# Patient Record
Sex: Male | Born: 1955 | Race: White | Hispanic: No | Smoking: Former smoker
Health system: Southern US, Community
[De-identification: ages and names within clinical notes are randomized; demographics above are authoritative.]

## PROBLEM LIST (undated history)

## (undated) DIAGNOSIS — N189 Chronic kidney disease, unspecified: Secondary | ICD-10-CM

## (undated) DIAGNOSIS — I10 Essential (primary) hypertension: Secondary | ICD-10-CM

## (undated) DIAGNOSIS — K219 Gastro-esophageal reflux disease without esophagitis: Secondary | ICD-10-CM

## (undated) DIAGNOSIS — R972 Elevated prostate specific antigen [PSA]: Secondary | ICD-10-CM

## (undated) DIAGNOSIS — E785 Hyperlipidemia, unspecified: Secondary | ICD-10-CM

## (undated) DIAGNOSIS — K648 Other hemorrhoids: Secondary | ICD-10-CM

## (undated) DIAGNOSIS — M1990 Unspecified osteoarthritis, unspecified site: Secondary | ICD-10-CM

## (undated) DIAGNOSIS — E079 Disorder of thyroid, unspecified: Secondary | ICD-10-CM

## (undated) DIAGNOSIS — F419 Anxiety disorder, unspecified: Secondary | ICD-10-CM

## (undated) DIAGNOSIS — Z87442 Personal history of urinary calculi: Secondary | ICD-10-CM

## (undated) DIAGNOSIS — J45909 Unspecified asthma, uncomplicated: Secondary | ICD-10-CM

## (undated) DIAGNOSIS — I452 Bifascicular block: Secondary | ICD-10-CM

## (undated) HISTORY — PX: CHOLECYSTECTOMY: SHX55

## (undated) HISTORY — DX: Hyperlipidemia, unspecified: E78.5

## (undated) HISTORY — PX: PROSTATE BIOPSY: SHX241

## (undated) HISTORY — PX: NO PAST SURGERIES: SHX2092

## (undated) HISTORY — DX: Disorder of thyroid, unspecified: E07.9

## (undated) HISTORY — DX: Essential (primary) hypertension: I10

## (undated) HISTORY — DX: Chronic kidney disease, unspecified: N18.9

---

## 2018-12-21 DIAGNOSIS — Z87442 Personal history of urinary calculi: Secondary | ICD-10-CM

## 2018-12-21 HISTORY — DX: Personal history of urinary calculi: Z87.442

## 2019-02-19 ENCOUNTER — Emergency Department (HOSPITAL_COMMUNITY): Payer: BLUE CROSS/BLUE SHIELD

## 2019-02-19 ENCOUNTER — Emergency Department (HOSPITAL_COMMUNITY)
Admission: EM | Admit: 2019-02-19 | Discharge: 2019-02-19 | Disposition: A | Discharge status: Home / Self Care | Payer: BLUE CROSS/BLUE SHIELD | Attending: Emergency Medicine | Admitting: Emergency Medicine

## 2019-02-19 ENCOUNTER — Encounter (HOSPITAL_COMMUNITY): Payer: Self-pay | Admitting: Emergency Medicine

## 2019-02-19 ENCOUNTER — Other Ambulatory Visit: Payer: Self-pay

## 2019-02-19 DIAGNOSIS — R1032 Left lower quadrant pain: Secondary | ICD-10-CM | POA: Diagnosis present

## 2019-02-19 DIAGNOSIS — N200 Calculus of kidney: Secondary | ICD-10-CM

## 2019-02-19 DIAGNOSIS — Z87891 Personal history of nicotine dependence: Secondary | ICD-10-CM | POA: Insufficient documentation

## 2019-02-19 DIAGNOSIS — Z79899 Other long term (current) drug therapy: Secondary | ICD-10-CM | POA: Insufficient documentation

## 2019-02-19 DIAGNOSIS — N3001 Acute cystitis with hematuria: Secondary | ICD-10-CM | POA: Insufficient documentation

## 2019-02-19 LAB — URINALYSIS, ROUTINE W REFLEX MICROSCOPIC
Bacteria, UA: NONE SEEN
Bilirubin Urine: NEGATIVE
Glucose, UA: NEGATIVE mg/dL
Ketones, ur: 20 mg/dL — AB
Nitrite: NEGATIVE
Protein, ur: 100 mg/dL — AB
RBC / HPF: >50 RBC/hpf — ABNORMAL HIGH (ref 0–5)
Specific Gravity, Urine: 1.025 (ref 1.005–1.030)
WBC, UA: >50 WBC/hpf — ABNORMAL HIGH (ref 0–5)
pH: 5 (ref 5.0–8.0)

## 2019-02-19 LAB — COMPREHENSIVE METABOLIC PANEL
ALT: 98 U/L — ABNORMAL HIGH (ref 0–44)
AST: 35 U/L (ref 15–41)
Albumin: 4.5 g/dL (ref 3.5–5.0)
Alkaline Phosphatase: 94 U/L (ref 38–126)
Anion gap: 13 (ref 5–15)
BUN: 23 mg/dL — ABNORMAL HIGH (ref 6–20)
CO2: 25 mmol/L (ref 22–32)
Calcium: 10 mg/dL (ref 8.9–10.3)
Chloride: 98 mmol/L (ref 98–111)
Creatinine, Ser: 1.16 mg/dL (ref 0.61–1.24)
GFR calc Af Amer: >60 mL/min (ref >60)
GFR calc non Af Amer: >60 mL/min (ref >60)
Glucose, Bld: 144 mg/dL — ABNORMAL HIGH (ref 70–99)
Potassium: 3.8 mmol/L (ref 3.5–5.1)
Sodium: 136 mmol/L (ref 135–145)
Total Bilirubin: 0.6 mg/dL (ref 0.3–1.2)
Total Protein: 9.2 g/dL — ABNORMAL HIGH (ref 6.5–8.1)

## 2019-02-19 LAB — CBC
HCT: 52.7 % — ABNORMAL HIGH (ref 39.0–52.0)
Hemoglobin: 17.4 g/dL — ABNORMAL HIGH (ref 13.0–17.0)
MCH: 31.2 pg (ref 26.0–34.0)
MCHC: 33 g/dL (ref 30.0–36.0)
MCV: 94.6 fL (ref 80.0–100.0)
Platelets: 782 10*3/uL — ABNORMAL HIGH (ref 150–400)
RBC: 5.57 MIL/uL (ref 4.22–5.81)
RDW: 13.1 % (ref 11.5–15.5)
WBC: 18.6 10*3/uL — ABNORMAL HIGH (ref 4.0–10.5)
nRBC: 0 % (ref 0.0–0.2)

## 2019-02-19 MED ORDER — SODIUM CHLORIDE 0.9 % IV BOLUS
1000.0000 mL | Freq: Once | INTRAVENOUS | Status: AC
  Administered 2019-02-19: 1000 mL via INTRAVENOUS

## 2019-02-19 MED ORDER — ONDANSETRON HCL 4 MG/2ML IJ SOLN
4.0000 mg | Freq: Once | INTRAMUSCULAR | Status: AC
  Administered 2019-02-19: 4 mg via INTRAVENOUS
  Filled 2019-02-19: qty 2

## 2019-02-19 MED ORDER — CEPHALEXIN 500 MG PO CAPS: 500.0000 mg | ORAL_CAPSULE | Freq: Four times a day (QID) | ORAL | 0 refills | Status: DC

## 2019-02-19 MED ORDER — MORPHINE SULFATE (PF) 4 MG/ML IV SOLN
4.0000 mg | Freq: Once | INTRAVENOUS | Status: AC
  Administered 2019-02-19: 4 mg via INTRAVENOUS
  Filled 2019-02-19: qty 1

## 2019-02-19 MED ORDER — ONDANSETRON 4 MG PO TBDP: 4.0000 mg | ORAL_TABLET | Freq: Three times a day (TID) | ORAL | 0 refills | Status: DC | PRN

## 2019-02-19 MED ORDER — TAMSULOSIN HCL 0.4 MG PO CAPS: 0.4000 mg | ORAL_CAPSULE | Freq: Every day | ORAL | 0 refills | Status: DC

## 2019-02-19 MED ORDER — SODIUM CHLORIDE 0.9 % IV SOLN
1.0000 g | Freq: Once | INTRAVENOUS | Status: AC
  Administered 2019-02-19: 1 g via INTRAVENOUS
  Filled 2019-02-19: qty 10

## 2019-02-19 MED ORDER — KETOROLAC TROMETHAMINE 10 MG PO TABS: 10.0000 mg | ORAL_TABLET | Freq: Four times a day (QID) | ORAL | 0 refills | Status: DC | PRN

## 2019-02-19 MED ORDER — OXYCODONE-ACETAMINOPHEN 5-325 MG PO TABS: 1.0000 | ORAL_TABLET | ORAL | 0 refills | Status: DC | PRN

## 2019-02-19 MED ORDER — SODIUM CHLORIDE 0.9 % IV SOLN
INTRAVENOUS | Status: DC
  Administered 2019-02-19: 15:00:00 via INTRAVENOUS

## 2019-02-19 NOTE — ED Triage Notes (Signed)
Pt c/o L flank pain since yesterday, worsening today. Pt denies abdominal pain, denies nausea. Pt states he recently recovered from flu.

## 2019-02-19 NOTE — ED Notes (Signed)
ED Provider at bedside. 

## 2019-02-19 NOTE — ED Provider Notes (Signed)
[facility] DEPT Provider Note   CSN: [id number] Arrival date & time: 02/19/19  1218    History   Chief Complaint Chief Complaint  Patient presents with  . Flank Pain    HPI Gregg [name] is a 63 y.o. male.     Pt presents to the ED today with left sided flank pain.  Pt said sx started suddenly while at church.  He said he's never had anything like this in the past.  He said he does not have any abdominal pain or urinary sx.  No rash.     History reviewed. No pertinent past medical history.  There are no active problems to display for this patient.     Home Medications    Prior to Admission medications   Medication Sig Start Date End Date Taking? Authorizing Provider  ibuprofen (ADVIL,MOTRIN) 600 MG tablet Take 600 mg by mouth every 6 (six) hours as needed for mild pain.  02/10/19  Yes [provider]  cephALEXin (KEFLEX) 500 MG capsule Take 1 capsule (500 mg total) by mouth 4 (four) times daily. 02/19/19   Isla Pence, MD  ketorolac (TORADOL) 10 MG tablet Take 1 tablet (10 mg total) by mouth every 6 (six) hours as needed. 02/19/19   Isla Pence, MD  ondansetron (ZOFRAN ODT) 4 MG disintegrating tablet Take 1 tablet (4 mg total) by mouth every 8 (eight) hours as needed. 02/19/19   Isla Pence, MD  oxyCODONE-acetaminophen (PERCOCET/ROXICET) 5-325 MG tablet Take 1 tablet by mouth every 4 (four) hours as needed for severe pain. 02/19/19   Isla Pence, MD  tamsulosin (FLOMAX) 0.4 MG CAPS capsule Take 1 capsule (0.4 mg total) by mouth daily. 02/19/19   Isla Pence, MD    Family History No family history on file.  Social History Social History   Tobacco Use  . Smoking status: Former Smoker    Years: 40.00    Last attempt to quit: 03/29/2017    Years since quitting: 1.8  . Smokeless tobacco: Never Used  Substance Use Topics  . Alcohol use: Yes    Frequency: Never    Comment: rare  . Drug use: Never     Allergies     Patient has no known allergies.   Review of Systems Review of Systems  Genitourinary: Positive for flank pain.  All other systems reviewed and are negative.    Physical Exam Updated Vital Signs BP 128/77   Pulse (!) 109   Temp (!) 96.4 F (35.8 C) (Axillary)   Resp 16   SpO2 97%   Physical Exam Vitals signs and nursing note reviewed.  Constitutional:      Appearance: Normal appearance.  HENT:     Head: Normocephalic and atraumatic.     Right Ear: External ear normal.     Left Ear: External ear normal.     Nose: Nose normal.     Mouth/Throat:     Mouth: Mucous membranes are moist.  Eyes:     Extraocular Movements: Extraocular movements intact.     Pupils: Pupils are equal, round, and reactive to light.  Neck:     Musculoskeletal: Normal range of motion.  Cardiovascular:     Rate and Rhythm: Regular rhythm. Tachycardia present.     Pulses: Normal pulses.     Heart sounds: Normal heart sounds.  Pulmonary:     Effort: Pulmonary effort is normal.     Breath sounds: Normal breath sounds.  Abdominal:  General: Abdomen is flat.  Musculoskeletal: Normal range of motion.  Skin:    General: Skin is warm.     Capillary Refill: Capillary refill takes less than 2 seconds.  Neurological:     General: No focal deficit present.     Mental Status: He is alert and oriented to person, place, and time.  Psychiatric:        Mood and Affect: Mood normal.        Behavior: Behavior normal.      ED Treatments / Results  Labs (all labs ordered are listed, but only abnormal results are displayed) Labs Reviewed  CBC - Abnormal; Notable for the following components:      Result Value   WBC 18.6 (*)    Hemoglobin 17.4 (*)    HCT 52.7 (*)    Platelets 782 (*)    All other components within normal limits  URINALYSIS, ROUTINE W REFLEX MICROSCOPIC - Abnormal; Notable for the following components:   Color, Urine AMBER (*)    APPearance CLOUDY (*)    Hgb urine dipstick  MODERATE (*)    Ketones, ur 20 (*)    Protein, ur 100 (*)    Leukocytes,Ua MODERATE (*)    RBC / HPF >50 (*)    WBC, UA >50 (*)    All other components within normal limits  COMPREHENSIVE METABOLIC PANEL - Abnormal; Notable for the following components:   Glucose, Bld 144 (*)    BUN 23 (*)    Total Protein 9.2 (*)    ALT 98 (*)    All other components within normal limits  URINE CULTURE    EKG None  Radiology Ct Renal Stone Study  Result Date: 02/19/2019 CLINICAL DATA:  Left flank pain. EXAM: CT ABDOMEN AND PELVIS WITHOUT CONTRAST TECHNIQUE: Multidetector CT imaging of the abdomen and pelvis was performed following the standard protocol without IV contrast. COMPARISON:  None. FINDINGS: Lower chest: Scattered small nodules are noted: -right middle lobe nodule measures 4 mm, image 19/4. -8 mm anterior right lower lobe lung nodule identified. -Lateral right middle lobe lung nodule measures 5 mm, image 26/4. Hepatobiliary: Multiple stones are identified within the gallbladder which measure up to 1.1 cm. No gallbladder wall thickening or pericholecystic inflammation. No biliary dilatation. Pancreas: Unremarkable. No pancreatic ductal dilatation or surrounding inflammatory changes. Spleen: Normal in size without focal abnormality. Adrenals/Urinary Tract: Normal appearance of the adrenal glands. Calcified nodule arises from upper pole of left kidney. Left-sided pelvocaliectasis is identified. There is edema and inflammation surrounding the left renal pelvis. A 1.3 x 0.8 cm stone within the central pelvis is identified, image 29/2. No ureteral calculi identified. No right renal stones or hydronephrosis. The urinary bladder appears normal. Stomach/Bowel: Stomach is within normal limits. Appendix appears normal. No evidence of bowel wall thickening, distention, or inflammatory changes. Vascular/Lymphatic: Aortic atherosclerosis. No aneurysm. No abdominopelvic adenopathy. Reproductive: Prostate is  unremarkable. Other: Bilateral inguinal hernias are identified. The right inguinal hernia contains a nonobstructed loop of small bowel. Left inguinal hernia contains fat only. No free fluid or fluid collections. Musculoskeletal: No acute or significant osseous findings. IMPRESSION: 1. 1.3 x 0.8 cm stone is identified within the left renal pelvis resulting in left-sided pelvocaliectasis. Surrounding fat stranding compare and edema is identified. 2. Gallstones 3.  Aortic Atherosclerosis (ICD10-I70.0). 4. There are several tiny nodules identified within the right lung. The largest measures 8 mm. Non-contrast chest CT at 3-6 months is recommended. If the nodules are stable at time  of repeat CT, then future CT at 18-24 months (from today's scan) is considered optional for low-risk patients, but is recommended for high-risk patients. This recommendation follows the consensus statement: Guidelines for Management of Incidental Pulmonary Nodules Detected on CT Images: From the Fleischner Society 2017; Radiology 2017; 284:228-243. 5. Bilateral inguinal hernias. The right inguinal hernia contains a nonobstructed loop of small bowel. Electronically Signed   By: Kerby Moors M.D.   On: 02/19/2019 14:35    Procedures Procedures (including critical care time)  Medications Ordered in ED Medications  sodium chloride 0.9 % bolus 1,000 mL (0 mLs Intravenous Stopped 02/19/19 1433)    And  0.9 %  sodium chloride infusion ( Intravenous New Bag/Given 02/19/19 1434)  cefTRIAXone (ROCEPHIN) 1 g in sodium chloride 0.9 % 100 mL IVPB (1 g Intravenous New Bag/Given 02/19/19 1504)  ondansetron (ZOFRAN) injection 4 mg (4 mg Intravenous Given 02/19/19 1312)  morphine 4 MG/ML injection 4 mg (4 mg Intravenous Given 02/19/19 1312)     Initial Impression / Assessment and Plan / ED Course  I have reviewed the triage vital signs and the nursing notes.  Pertinent labs & imaging results that were available during my care of the patient were  reviewed by me and considered in my medical decision making (see chart for details).      Pt is feeling much better.  Pain is 0.  Pt treated for a UTI with Rocephin.  Urine sent for culture.  Due to the stone and the UTI, I spoke with Dr. Lovena Neighbours.  He does not think the stone is obstructive, so he does not think pt needs to get an emergent stent.  Since pt's pain is better, he thinks pt can f/u as an outpatient. Pt's HR is improved.  He is afebrile and does not appear to be septic.  Pt will be d/c home with flomax, toradol, percocet, zofran, and keflex.  He knows to return if sx worsen.  Final Clinical Impressions(s) / ED Diagnoses   Final diagnoses:  Kidney stone  Acute cystitis with hematuria    ED Discharge Orders         Ordered    tamsulosin (FLOMAX) 0.4 MG CAPS capsule  Daily     02/19/19 1509    oxyCODONE-acetaminophen (PERCOCET/ROXICET) 5-325 MG tablet  Every 4 hours PRN     02/19/19 1509    ondansetron (ZOFRAN ODT) 4 MG disintegrating tablet  Every 8 hours PRN     02/19/19 1509    ketorolac (TORADOL) 10 MG tablet  Every 6 hours PRN     02/19/19 1509    cephALEXin (KEFLEX) 500 MG capsule  4 times daily     02/19/19 1509           Isla Pence, MD 02/19/19 1515

## 2019-02-20 ENCOUNTER — Other Ambulatory Visit: Payer: Self-pay | Admitting: Urology

## 2019-02-20 LAB — URINE CULTURE: Culture: NO GROWTH

## 2019-02-21 ENCOUNTER — Encounter (HOSPITAL_COMMUNITY): Payer: Self-pay | Admitting: *Deleted

## 2019-02-21 NOTE — Progress Notes (Signed)
Patient to arrive 0745 on 02/23/2019 to admitting. NPO after midnight. NO aspirin or NSAIDS until after procedure. Reviewed need for laxative. Needs responsible driver. He verbalizes understanding.

## 2019-02-21 NOTE — H&P (Signed)
Acute Kidney Stone  HPI: Gregg [name] is a 63 year-old male patient who is here for further eval and management of kidney stones.  He was diagnosed with a kidney stone on 02/19/2019. The patient presented to 02/18/2018 with symptoms of a kidney stone.   His pain started about 02/19/2019. The pain is on the left side.   Abdomen/Pelvic CT: 02/18/2018 - 14x61mm left UPJ stone. The patient underwent CT scan prior to today's appointment.   The patient relates initially having nausea and flank pain. He is not currently having flank pain, back pain, groin pain, nausea, vomiting, fever or chills. He has been taking keflex. He has not caught a stone in his urine strainer since his symptoms began.   He has never had surgical treatment for calculi in the past. This is his first kidney stone.     ALLERGIES: None   MEDICATIONS: Keflex  Oxycodone Hcl     Notes: pt states he is taking another antibiotic but does not know the name of it.   GU PSH: None   NON-GU PSH: None   GU PMH: None   NON-GU PMH: None   FAMILY HISTORY: Breast Cancer - Mother heart - Father Patient's father is still living - Father patient's mother is still living - Mother   SOCIAL HISTORY: Marital Status: Single Current Smoking Status: Patient does not smoke anymore. Has not smoked since 03/22/2017. Smoked for 30 years. Smoked 1 pack per day.   Tobacco Use Assessment Completed: Used Tobacco in last 30 days? Drinks 1 caffeinated drink per day.    REVIEW OF SYSTEMS:    GU Review Male:   Patient reports hard to postpone urination and frequent urination. Patient denies have to strain to urinate , trouble starting your stream, get up at night to urinate, burning/ pain with urination, penile pain, stream starts and stops, erection problems, and leakage of urine.  Gastrointestinal (Upper):   Patient denies nausea, vomiting, and indigestion/ heartburn.  Gastrointestinal (Lower):   Patient denies diarrhea and constipation.   Constitutional:   Patient denies fever, night sweats, weight loss, and fatigue.  Skin:   Patient denies skin rash/ lesion and itching.  Eyes:   Patient denies blurred vision and double vision.  Ears/ Nose/ Throat:   Patient denies sore throat and sinus problems.  Hematologic/Lymphatic:   Patient denies swollen glands and easy bruising.  Cardiovascular:   Patient denies leg swelling and chest pains.  Respiratory:   Patient denies cough and shortness of breath.  Endocrine:   Patient denies excessive thirst.  Musculoskeletal:   Patient denies back pain and joint pain.  Neurological:   Patient denies headaches and dizziness.  Psychologic:   Patient denies depression and anxiety.   VITAL SIGNS:      02/20/2019 11:50 AM  Weight 161 lb / 73.03 kg  Height 69 in / 175.26 cm  BP 133/78 mmHg  Heart Rate 102 /min  Temperature 98.3 F / 36.8 C  BMI 23.8 kg/m   MULTI-SYSTEM PHYSICAL EXAMINATION:    Constitutional: Well-nourished. No physical deformities. Normally developed. Good grooming.  Neck: Neck symmetrical, not swollen. Normal tracheal position.  Respiratory: Normal breath sounds. No labored breathing, no use of accessory muscles.   Cardiovascular: Regular rate and rhythm. No murmur, no gallop. Normal temperature, normal extremity pulses, no swelling, no varicosities.   Lymphatic: No enlargement of neck, axillae, groin.  Skin: No paleness, no jaundice, no cyanosis. No lesion, no ulcer, no rash.  Neurologic / Psychiatric: Oriented to  time, oriented to place, oriented to person. No depression, no anxiety, no agitation.  Gastrointestinal: No mass, no tenderness, no rigidity, non obese abdomen.  Eyes: Normal conjunctivae. Normal eyelids.  Ears, Nose, Mouth, and Throat: Left ear no scars, no lesions, no masses. Right ear no scars, no lesions, no masses. Nose no scars, no lesions, no masses. Normal hearing. Normal lips.  Musculoskeletal: Normal gait and station of head and neck.     PAST DATA  REVIEWED:  Source Of History:  Patient  Records Review:   Previous Doctor Records, Previous Patient Records, POC Tool  X-Ray Review: C.T. Abdomen/Pelvis: Reviewed Films. Discussed With Patient.     PROCEDURES:          Urinalysis w/Scope Dipstick Dipstick Cont'd Micro  Color: Yellow Bilirubin: Neg mg/dL WBC/hpf: 0 - 5/hpf  Appearance: Clear Ketones: Neg mg/dL RBC/hpf: 10 - 20/hpf  Specific Gravity: 1.025 Blood: 2+ ery/uL Bacteria: Rare (0-9/hpf)  pH: 6.5 Protein: Trace mg/dL Cystals: NS (Not Seen)  Glucose: Neg mg/dL Urobilinogen: 0.2 mg/dL Casts: NS (Not Seen)    Nitrites: Neg Trichomonas: Not Present    Leukocyte Esterase: Trace leu/uL Mucous: Present      Epithelial Cells: NS (Not Seen)      Yeast: NS (Not Seen)      Sperm: Not Present    ASSESSMENT:      ICD-10 Details  1 GU:   Renal calculus - N20.0    PLAN:           Document Letter(s):  Created for Patient: Clinical Summary    We discussed management options including medical expulsion therapy, shockwave lithotripsy, and ureteroscopy. Ultimately, the patient has opted for shock wave lithotripsy. I discussed with the patient the procedure in detail as well as the risk and benefits. The patient is aware that she may need additional procedures. She also is aware of the risks of hematoma and pain. We will try to get this patient's scheduled as soon as possible.         Notes:   The patient understands that his stone is particularly dense and he has a slightly lower stone free chance. However, despite this he would like to proceed.   After a thorough review of the management options for the patient's condition the patient  elected to proceed with surgical therapy as noted above. We have discussed the potential benefits and risks of the procedure, side effects of the proposed treatment, the likelihood of the patient achieving the goals of the procedure, and any potential problems that might occur during the procedure or  recuperation. Informed consent has been obtained.

## 2019-02-23 ENCOUNTER — Other Ambulatory Visit: Payer: Self-pay

## 2019-02-23 ENCOUNTER — Encounter (HOSPITAL_COMMUNITY): Payer: Self-pay | Admitting: *Deleted

## 2019-02-23 ENCOUNTER — Ambulatory Visit (HOSPITAL_COMMUNITY)
Admission: RE | Admit: 2019-02-23 | Discharge: 2019-02-23 | Disposition: A | Discharge status: Home / Self Care | Payer: BLUE CROSS/BLUE SHIELD | Source: Other Acute Inpatient Hospital | Attending: Urology | Admitting: Urology

## 2019-02-23 ENCOUNTER — Encounter (HOSPITAL_COMMUNITY)
Admission: RE | Disposition: A | Discharge status: Home / Self Care | Payer: Self-pay | Source: Other Acute Inpatient Hospital | Attending: Urology

## 2019-02-23 ENCOUNTER — Ambulatory Visit (HOSPITAL_COMMUNITY): Payer: BLUE CROSS/BLUE SHIELD

## 2019-02-23 DIAGNOSIS — Z87891 Personal history of nicotine dependence: Secondary | ICD-10-CM | POA: Diagnosis not present

## 2019-02-23 DIAGNOSIS — N200 Calculus of kidney: Secondary | ICD-10-CM | POA: Diagnosis not present

## 2019-02-23 HISTORY — PX: EXTRACORPOREAL SHOCK WAVE LITHOTRIPSY: SHX1557

## 2019-02-23 HISTORY — DX: Personal history of urinary calculi: Z87.442

## 2019-02-23 SURGERY — LITHOTRIPSY, ESWL
Anesthesia: LOCAL | Laterality: Left

## 2019-02-23 MED ORDER — DIAZEPAM 5 MG PO TABS
10.0000 mg | ORAL_TABLET | ORAL | Status: AC
  Administered 2019-02-23: 10 mg via ORAL
  Filled 2019-02-23: qty 2

## 2019-02-23 MED ORDER — OXYCODONE-ACETAMINOPHEN 5-325 MG PO TABS: 1.0000 | ORAL_TABLET | Freq: Four times a day (QID) | ORAL | 0 refills | Status: AC | PRN

## 2019-02-23 MED ORDER — DIPHENHYDRAMINE HCL 25 MG PO CAPS
25.0000 mg | ORAL_CAPSULE | ORAL | Status: AC
  Administered 2019-02-23: 25 mg via ORAL
  Filled 2019-02-23: qty 1

## 2019-02-23 MED ORDER — SODIUM CHLORIDE 0.9 % IV SOLN
INTRAVENOUS | Status: DC
  Administered 2019-02-23: 09:00:00 via INTRAVENOUS

## 2019-02-23 MED ORDER — CIPROFLOXACIN HCL 500 MG PO TABS
500.0000 mg | ORAL_TABLET | ORAL | Status: AC
  Administered 2019-02-23: 500 mg via ORAL
  Filled 2019-02-23: qty 1

## 2019-02-23 NOTE — Interval H&P Note (Signed)
History and Physical Interval Note:  02/23/2019 10:27 AM  Gregg [name]  has presented today for surgery, with the diagnosis of LEFT URETEROPELVIC JUNCTION STONE  The various methods of treatment have been discussed with the patient and family. After consideration of risks, benefits and other options for treatment, the patient has consented to  Procedure(s): EXTRACORPOREAL SHOCK WAVE LITHOTRIPSY (ESWL) (Left) as a surgical intervention .  The patient's history has been reviewed, patient examined, no change in status, stable for surgery.  I have reviewed the patient's chart and labs.  Questions were answered to the patient's satisfaction.     Nuri Larmer A Krissy Orebaugh

## 2019-02-24 ENCOUNTER — Encounter (HOSPITAL_COMMUNITY): Payer: Self-pay | Admitting: Urology

## 2021-03-07 ENCOUNTER — Encounter (HOSPITAL_COMMUNITY): Payer: Self-pay | Admitting: Emergency Medicine

## 2021-03-07 ENCOUNTER — Emergency Department (HOSPITAL_COMMUNITY): Payer: BC Managed Care – PPO | Admitting: Certified Registered Nurse Anesthetist

## 2021-03-07 ENCOUNTER — Emergency Department (HOSPITAL_COMMUNITY): Payer: BC Managed Care – PPO

## 2021-03-07 ENCOUNTER — Inpatient Hospital Stay: Admit: 2021-03-07 | Payer: BC Managed Care – PPO | Admitting: Surgery

## 2021-03-07 ENCOUNTER — Encounter (HOSPITAL_COMMUNITY): Admission: EM | Disposition: A | Discharge status: Home / Self Care | Payer: Self-pay | Source: Home / Self Care

## 2021-03-07 ENCOUNTER — Other Ambulatory Visit: Payer: Self-pay

## 2021-03-07 ENCOUNTER — Inpatient Hospital Stay (HOSPITAL_COMMUNITY)
Admission: EM | Admit: 2021-03-07 | Discharge: 2021-03-08 | DRG: 339 | Disposition: A | Discharge status: Home / Self Care | Payer: BC Managed Care – PPO | Attending: General Surgery | Admitting: General Surgery

## 2021-03-07 DIAGNOSIS — Z20822 Contact with and (suspected) exposure to covid-19: Secondary | ICD-10-CM | POA: Diagnosis present

## 2021-03-07 DIAGNOSIS — K4000 Bilateral inguinal hernia, with obstruction, without gangrene, not specified as recurrent: Secondary | ICD-10-CM | POA: Diagnosis present

## 2021-03-07 DIAGNOSIS — K3589 Other acute appendicitis without perforation or gangrene: Secondary | ICD-10-CM | POA: Diagnosis present

## 2021-03-07 DIAGNOSIS — K3532 Acute appendicitis with perforation and localized peritonitis, without abscess: Principal | ICD-10-CM | POA: Diagnosis present

## 2021-03-07 DIAGNOSIS — K4020 Bilateral inguinal hernia, without obstruction or gangrene, not specified as recurrent: Secondary | ICD-10-CM

## 2021-03-07 DIAGNOSIS — Z87442 Personal history of urinary calculi: Secondary | ICD-10-CM

## 2021-03-07 DIAGNOSIS — K3580 Unspecified acute appendicitis: Secondary | ICD-10-CM

## 2021-03-07 DIAGNOSIS — K4030 Unilateral inguinal hernia, with obstruction, without gangrene, not specified as recurrent: Secondary | ICD-10-CM

## 2021-03-07 DIAGNOSIS — Z87891 Personal history of nicotine dependence: Secondary | ICD-10-CM

## 2021-03-07 DIAGNOSIS — N200 Calculus of kidney: Secondary | ICD-10-CM | POA: Insufficient documentation

## 2021-03-07 DIAGNOSIS — K4090 Unilateral inguinal hernia, without obstruction or gangrene, not specified as recurrent: Secondary | ICD-10-CM

## 2021-03-07 HISTORY — PX: LAPAROSCOPIC APPENDECTOMY: SHX408

## 2021-03-07 LAB — URINALYSIS, ROUTINE W REFLEX MICROSCOPIC
Bilirubin Urine: NEGATIVE
Glucose, UA: NEGATIVE mg/dL
Ketones, ur: 5 mg/dL — AB
Leukocytes,Ua: NEGATIVE
Nitrite: NEGATIVE
Protein, ur: NEGATIVE mg/dL
Specific Gravity, Urine: 1.018 (ref 1.005–1.030)
pH: 7 (ref 5.0–8.0)

## 2021-03-07 LAB — COMPREHENSIVE METABOLIC PANEL
ALT: 18 U/L (ref 0–44)
AST: 19 U/L (ref 15–41)
Albumin: 4.5 g/dL (ref 3.5–5.0)
Alkaline Phosphatase: 70 U/L (ref 38–126)
Anion gap: 12 (ref 5–15)
BUN: 18 mg/dL (ref 8–23)
CO2: 27 mmol/L (ref 22–32)
Calcium: 9.8 mg/dL (ref 8.9–10.3)
Chloride: 100 mmol/L (ref 98–111)
Creatinine, Ser: 1.08 mg/dL (ref 0.61–1.24)
GFR, Estimated: >60 mL/min (ref >60)
Glucose, Bld: 123 mg/dL — ABNORMAL HIGH (ref 70–99)
Potassium: 3.4 mmol/L — ABNORMAL LOW (ref 3.5–5.1)
Sodium: 139 mmol/L (ref 135–145)
Total Bilirubin: 1 mg/dL (ref 0.3–1.2)
Total Protein: 8.2 g/dL — ABNORMAL HIGH (ref 6.5–8.1)

## 2021-03-07 LAB — RESP PANEL BY RT-PCR (FLU A&B, COVID) ARPGX2
Influenza A by PCR: NEGATIVE
Influenza B by PCR: NEGATIVE
SARS Coronavirus 2 by RT PCR: NEGATIVE

## 2021-03-07 LAB — CBC
HCT: 52.1 % — ABNORMAL HIGH (ref 39.0–52.0)
Hemoglobin: 17.5 g/dL — ABNORMAL HIGH (ref 13.0–17.0)
MCH: 31.4 pg (ref 26.0–34.0)
MCHC: 33.6 g/dL (ref 30.0–36.0)
MCV: 93.5 fL (ref 80.0–100.0)
Platelets: 272 10*3/uL (ref 150–400)
RBC: 5.57 MIL/uL (ref 4.22–5.81)
RDW: 12.5 % (ref 11.5–15.5)
WBC: 14.7 10*3/uL — ABNORMAL HIGH (ref 4.0–10.5)
nRBC: 0 % (ref 0.0–0.2)

## 2021-03-07 LAB — LIPASE, BLOOD: Lipase: 46 U/L (ref 11–51)

## 2021-03-07 SURGERY — APPENDECTOMY, LAPAROSCOPIC
Anesthesia: General | Site: Abdomen

## 2021-03-07 MED ORDER — EPHEDRINE SULFATE-NACL 50-0.9 MG/10ML-% IV SOSY
PREFILLED_SYRINGE | INTRAVENOUS | Status: DC | PRN
  Administered 2021-03-07: 10 mg via INTRAVENOUS

## 2021-03-07 MED ORDER — DEXAMETHASONE SODIUM PHOSPHATE 10 MG/ML IJ SOLN
INTRAMUSCULAR | Status: AC
  Filled 2021-03-07: qty 1

## 2021-03-07 MED ORDER — METHOCARBAMOL 500 MG PO TABS
500.0000 mg | ORAL_TABLET | Freq: Four times a day (QID) | ORAL | Status: DC | PRN
  Administered 2021-03-07 – 2021-03-08 (×2): 500 mg via ORAL
  Filled 2021-03-07 (×2): qty 1

## 2021-03-07 MED ORDER — ONDANSETRON HCL 4 MG/2ML IJ SOLN: 4.0000 mg | Freq: Four times a day (QID) | INTRAMUSCULAR | Status: DC | PRN

## 2021-03-07 MED ORDER — PHENYLEPHRINE 40 MCG/ML (10ML) SYRINGE FOR IV PUSH (FOR BLOOD PRESSURE SUPPORT)
PREFILLED_SYRINGE | INTRAVENOUS | Status: AC
  Filled 2021-03-07: qty 10

## 2021-03-07 MED ORDER — ACETAMINOPHEN 650 MG RE SUPP: 650.0000 mg | Freq: Four times a day (QID) | RECTAL | Status: DC | PRN

## 2021-03-07 MED ORDER — MENTHOL 3 MG MT LOZG: 1.0000 | LOZENGE | OROMUCOSAL | Status: DC | PRN

## 2021-03-07 MED ORDER — PHENYLEPHRINE HCL-NACL 10-0.9 MG/250ML-% IV SOLN
INTRAVENOUS | Status: DC | PRN
  Administered 2021-03-07: 40 ug/min via INTRAVENOUS

## 2021-03-07 MED ORDER — LACTATED RINGERS IV SOLN: INTRAVENOUS | Status: DC

## 2021-03-07 MED ORDER — FENTANYL CITRATE (PF) 100 MCG/2ML IJ SOLN
INTRAMUSCULAR | Status: AC
  Filled 2021-03-07: qty 2

## 2021-03-07 MED ORDER — ACETAMINOPHEN 500 MG PO TABS
1000.0000 mg | ORAL_TABLET | Freq: Four times a day (QID) | ORAL | Status: DC
  Administered 2021-03-07 – 2021-03-08 (×2): 1000 mg via ORAL
  Filled 2021-03-07 (×2): qty 2

## 2021-03-07 MED ORDER — ACETAMINOPHEN 10 MG/ML IV SOLN
INTRAVENOUS | Status: AC
  Filled 2021-03-07: qty 100

## 2021-03-07 MED ORDER — ENALAPRILAT 1.25 MG/ML IV SOLN
0.6250 mg | Freq: Four times a day (QID) | INTRAVENOUS | Status: DC | PRN
  Filled 2021-03-07: qty 1

## 2021-03-07 MED ORDER — KETAMINE HCL 10 MG/ML IJ SOLN
INTRAMUSCULAR | Status: DC | PRN
  Administered 2021-03-07: 40 mg via INTRAVENOUS
  Administered 2021-03-07: 10 mg via INTRAVENOUS

## 2021-03-07 MED ORDER — PIPERACILLIN-TAZOBACTAM 3.375 G IVPB
3.3750 g | Freq: Three times a day (TID) | INTRAVENOUS | Status: DC
  Administered 2021-03-07 – 2021-03-08 (×2): 3.375 g via INTRAVENOUS
  Filled 2021-03-07 (×2): qty 50

## 2021-03-07 MED ORDER — NAPROXEN SODIUM 220 MG PO TABS: 440.0000 mg | ORAL_TABLET | ORAL | Status: DC | PRN

## 2021-03-07 MED ORDER — DIPHENHYDRAMINE HCL 50 MG/ML IJ SOLN: 12.5000 mg | Freq: Four times a day (QID) | INTRAMUSCULAR | Status: DC | PRN

## 2021-03-07 MED ORDER — LACTATED RINGERS IR SOLN
Status: DC | PRN
  Administered 2021-03-07: 1000 mL

## 2021-03-07 MED ORDER — FENTANYL CITRATE (PF) 250 MCG/5ML IJ SOLN
INTRAMUSCULAR | Status: DC | PRN
  Administered 2021-03-07: 100 ug via INTRAVENOUS

## 2021-03-07 MED ORDER — SUGAMMADEX SODIUM 200 MG/2ML IV SOLN
INTRAVENOUS | Status: DC | PRN
  Administered 2021-03-07: 200 mg via INTRAVENOUS

## 2021-03-07 MED ORDER — ONDANSETRON HCL 4 MG/2ML IJ SOLN
4.0000 mg | Freq: Once | INTRAMUSCULAR | Status: AC
  Administered 2021-03-07: 4 mg via INTRAVENOUS
  Filled 2021-03-07: qty 2

## 2021-03-07 MED ORDER — DEXAMETHASONE SODIUM PHOSPHATE 10 MG/ML IJ SOLN
INTRAMUSCULAR | Status: DC | PRN
  Administered 2021-03-07: 4 mg via INTRAVENOUS

## 2021-03-07 MED ORDER — SODIUM CHLORIDE 0.9 % IV SOLN: Freq: Three times a day (TID) | INTRAVENOUS | Status: DC | PRN

## 2021-03-07 MED ORDER — HYDROMORPHONE HCL 1 MG/ML IJ SOLN: 0.5000 mg | INTRAMUSCULAR | Status: DC | PRN

## 2021-03-07 MED ORDER — ACETAMINOPHEN 325 MG PO TABS: 650.0000 mg | ORAL_TABLET | Freq: Four times a day (QID) | ORAL | Status: DC | PRN

## 2021-03-07 MED ORDER — PROPOFOL 10 MG/ML IV BOLUS
INTRAVENOUS | Status: AC
  Filled 2021-03-07: qty 20

## 2021-03-07 MED ORDER — ADULT MULTIVITAMIN W/MINERALS CH
1.0000 | ORAL_TABLET | Freq: Every day | ORAL | Status: DC
  Administered 2021-03-08: 1 via ORAL
  Filled 2021-03-07: qty 1

## 2021-03-07 MED ORDER — MIDAZOLAM HCL 5 MG/5ML IJ SOLN
INTRAMUSCULAR | Status: DC | PRN
  Administered 2021-03-07: 2 mg via INTRAVENOUS

## 2021-03-07 MED ORDER — BISACODYL 10 MG RE SUPP: 10.0000 mg | Freq: Two times a day (BID) | RECTAL | Status: DC | PRN

## 2021-03-07 MED ORDER — CALCIUM POLYCARBOPHIL 625 MG PO TABS
625.0000 mg | ORAL_TABLET | Freq: Two times a day (BID) | ORAL | Status: DC
  Administered 2021-03-07 – 2021-03-08 (×2): 625 mg via ORAL
  Filled 2021-03-07 (×3): qty 1

## 2021-03-07 MED ORDER — KETAMINE HCL 10 MG/ML IJ SOLN
INTRAMUSCULAR | Status: AC
  Filled 2021-03-07: qty 1

## 2021-03-07 MED ORDER — SODIUM CHLORIDE 0.9 % IV SOLN: Freq: Once | INTRAVENOUS | Status: AC

## 2021-03-07 MED ORDER — PROCHLORPERAZINE EDISYLATE 10 MG/2ML IJ SOLN: 5.0000 mg | Freq: Four times a day (QID) | INTRAMUSCULAR | Status: DC | PRN

## 2021-03-07 MED ORDER — PROPOFOL 10 MG/ML IV BOLUS
INTRAVENOUS | Status: DC | PRN
  Administered 2021-03-07: 160 mg via INTRAVENOUS

## 2021-03-07 MED ORDER — BUPIVACAINE-EPINEPHRINE (PF) 0.25% -1:200000 IJ SOLN
INTRAMUSCULAR | Status: AC
  Filled 2021-03-07: qty 30

## 2021-03-07 MED ORDER — FENTANYL CITRATE (PF) 100 MCG/2ML IJ SOLN: 25.0000 ug | INTRAMUSCULAR | Status: DC | PRN

## 2021-03-07 MED ORDER — MORPHINE SULFATE (PF) 4 MG/ML IV SOLN
4.0000 mg | Freq: Once | INTRAVENOUS | Status: AC
  Administered 2021-03-07: 4 mg via INTRAVENOUS
  Filled 2021-03-07: qty 1

## 2021-03-07 MED ORDER — BUPIVACAINE LIPOSOME 1.3 % IJ SUSP
20.0000 mL | Freq: Once | INTRAMUSCULAR | Status: AC
  Administered 2021-03-07: 20 mL
  Filled 2021-03-07: qty 20

## 2021-03-07 MED ORDER — NAPROXEN 375 MG PO TABS
375.0000 mg | ORAL_TABLET | Freq: Every day | ORAL | Status: DC | PRN
  Filled 2021-03-07: qty 1

## 2021-03-07 MED ORDER — SODIUM CHLORIDE 0.9 % IV BOLUS
500.0000 mL | Freq: Once | INTRAVENOUS | Status: AC
  Administered 2021-03-07: 500 mL via INTRAVENOUS

## 2021-03-07 MED ORDER — ROCURONIUM BROMIDE 10 MG/ML (PF) SYRINGE
PREFILLED_SYRINGE | INTRAVENOUS | Status: AC
  Filled 2021-03-07: qty 10

## 2021-03-07 MED ORDER — METOPROLOL TARTRATE 5 MG/5ML IV SOLN
5.0000 mg | Freq: Four times a day (QID) | INTRAVENOUS | Status: DC | PRN
  Filled 2021-03-07: qty 5

## 2021-03-07 MED ORDER — MAGIC MOUTHWASH
15.0000 mL | Freq: Four times a day (QID) | ORAL | Status: DC | PRN
  Filled 2021-03-07: qty 15

## 2021-03-07 MED ORDER — LACTATED RINGERS IV BOLUS: 1000.0000 mL | Freq: Three times a day (TID) | INTRAVENOUS | Status: DC | PRN

## 2021-03-07 MED ORDER — ROCURONIUM BROMIDE 10 MG/ML (PF) SYRINGE
PREFILLED_SYRINGE | INTRAVENOUS | Status: DC | PRN
  Administered 2021-03-07: 60 mg via INTRAVENOUS
  Administered 2021-03-07: 10 mg via INTRAVENOUS

## 2021-03-07 MED ORDER — ONE-A-DAY MENS PO TABS: 1.0000 | ORAL_TABLET | Freq: Every day | ORAL | Status: DC

## 2021-03-07 MED ORDER — PHENOL 1.4 % MT LIQD
1.0000 | OROMUCOSAL | Status: DC | PRN
  Filled 2021-03-07: qty 177

## 2021-03-07 MED ORDER — LIDOCAINE 2% (20 MG/ML) 5 ML SYRINGE
INTRAMUSCULAR | Status: AC
  Filled 2021-03-07: qty 5

## 2021-03-07 MED ORDER — ONDANSETRON HCL 4 MG/2ML IJ SOLN
INTRAMUSCULAR | Status: AC
  Filled 2021-03-07: qty 2

## 2021-03-07 MED ORDER — KETOROLAC TROMETHAMINE 30 MG/ML IJ SOLN
INTRAMUSCULAR | Status: DC | PRN
  Administered 2021-03-07: 30 mg via INTRAVENOUS

## 2021-03-07 MED ORDER — LIDOCAINE 2% (20 MG/ML) 5 ML SYRINGE
INTRAMUSCULAR | Status: DC | PRN
  Administered 2021-03-07: 40 mg via INTRAVENOUS

## 2021-03-07 MED ORDER — PIPERACILLIN-TAZOBACTAM 3.375 G IVPB 30 MIN
3.3750 g | Freq: Once | INTRAVENOUS | Status: AC
  Administered 2021-03-07: 3.375 g via INTRAVENOUS
  Filled 2021-03-07: qty 50

## 2021-03-07 MED ORDER — HYDROCORTISONE 1 % EX CREA
1.0000 "application " | TOPICAL_CREAM | Freq: Three times a day (TID) | CUTANEOUS | Status: DC | PRN
  Filled 2021-03-07: qty 28

## 2021-03-07 MED ORDER — LIP MEDEX EX OINT
1.0000 "application " | TOPICAL_OINTMENT | Freq: Two times a day (BID) | CUTANEOUS | Status: DC
  Administered 2021-03-07 – 2021-03-08 (×2): 1 via TOPICAL
  Filled 2021-03-07: qty 7

## 2021-03-07 MED ORDER — ALUM & MAG HYDROXIDE-SIMETH 200-200-20 MG/5ML PO SUSP: 30.0000 mL | Freq: Four times a day (QID) | ORAL | Status: DC | PRN

## 2021-03-07 MED ORDER — IOHEXOL 300 MG/ML  SOLN
100.0000 mL | Freq: Once | INTRAMUSCULAR | Status: AC | PRN
  Administered 2021-03-07: 100 mL via INTRAVENOUS

## 2021-03-07 MED ORDER — DM-GUAIFENESIN ER 30-600 MG PO TB12: 1.0000 | ORAL_TABLET | Freq: Every day | ORAL | Status: DC | PRN

## 2021-03-07 MED ORDER — GUAIFENESIN-DM 100-10 MG/5ML PO SYRP: 10.0000 mL | ORAL_SOLUTION | ORAL | Status: DC | PRN

## 2021-03-07 MED ORDER — LACTATED RINGERS IV SOLN: INTRAVENOUS | Status: DC | PRN

## 2021-03-07 MED ORDER — HYDROCORTISONE (PERIANAL) 2.5 % EX CREA
1.0000 "application " | TOPICAL_CREAM | Freq: Four times a day (QID) | CUTANEOUS | Status: DC | PRN
  Filled 2021-03-07: qty 28.35

## 2021-03-07 MED ORDER — ACETAMINOPHEN 10 MG/ML IV SOLN
INTRAVENOUS | Status: DC | PRN
  Administered 2021-03-07: 1000 mg via INTRAVENOUS

## 2021-03-07 MED ORDER — SIMETHICONE 80 MG PO CHEW: 40.0000 mg | CHEWABLE_TABLET | Freq: Four times a day (QID) | ORAL | Status: DC | PRN

## 2021-03-07 MED ORDER — OXYCODONE HCL 5 MG PO TABS: 5.0000 mg | ORAL_TABLET | Freq: Once | ORAL | Status: DC | PRN

## 2021-03-07 MED ORDER — PROCHLORPERAZINE MALEATE 10 MG PO TABS
10.0000 mg | ORAL_TABLET | Freq: Four times a day (QID) | ORAL | Status: DC | PRN
  Filled 2021-03-07: qty 1

## 2021-03-07 MED ORDER — ONDANSETRON HCL 4 MG/2ML IJ SOLN
INTRAMUSCULAR | Status: DC | PRN
  Administered 2021-03-07: 4 mg via INTRAVENOUS

## 2021-03-07 MED ORDER — OXYCODONE HCL 5 MG/5ML PO SOLN: 5.0000 mg | Freq: Once | ORAL | Status: DC | PRN

## 2021-03-07 MED ORDER — PIPERACILLIN-TAZOBACTAM 3.375 G IVPB
INTRAVENOUS | Status: AC
  Filled 2021-03-07: qty 50

## 2021-03-07 MED ORDER — PHENYLEPHRINE 40 MCG/ML (10ML) SYRINGE FOR IV PUSH (FOR BLOOD PRESSURE SUPPORT)
PREFILLED_SYRINGE | INTRAVENOUS | Status: DC | PRN
  Administered 2021-03-07: 120 ug via INTRAVENOUS
  Administered 2021-03-07: 160 ug via INTRAVENOUS
  Administered 2021-03-07: 120 ug via INTRAVENOUS

## 2021-03-07 MED ORDER — ENOXAPARIN SODIUM 40 MG/0.4ML ~~LOC~~ SOLN
40.0000 mg | SUBCUTANEOUS | Status: DC
  Administered 2021-03-08: 40 mg via SUBCUTANEOUS
  Filled 2021-03-07: qty 0.4

## 2021-03-07 MED ORDER — ONDANSETRON 4 MG PO TBDP: 4.0000 mg | ORAL_TABLET | Freq: Four times a day (QID) | ORAL | Status: DC | PRN

## 2021-03-07 MED ORDER — BUPIVACAINE-EPINEPHRINE 0.25% -1:200000 IJ SOLN
INTRAMUSCULAR | Status: DC | PRN
  Administered 2021-03-07: 30 mL

## 2021-03-07 MED ORDER — MIDAZOLAM HCL 2 MG/2ML IJ SOLN
INTRAMUSCULAR | Status: AC
  Filled 2021-03-07: qty 2

## 2021-03-07 SURGICAL SUPPLY — 50 items
APPLIER CLIP 5 13 M/L LIGAMAX5 (MISCELLANEOUS)
APPLIER CLIP ROT 10 11.4 M/L (STAPLE)
CABLE HIGH FREQUENCY MONO STRZ (ELECTRODE) ×2 IMPLANT
CHLORAPREP W/TINT 26 (MISCELLANEOUS) ×2 IMPLANT
CLIP APPLIE 5 13 M/L LIGAMAX5 (MISCELLANEOUS) IMPLANT
CLIP APPLIE ROT 10 11.4 M/L (STAPLE) IMPLANT
COVER SURGICAL LIGHT HANDLE (MISCELLANEOUS) ×2 IMPLANT
COVER WAND RF STERILE (DRAPES) IMPLANT
DECANTER SPIKE VIAL GLASS SM (MISCELLANEOUS) ×2 IMPLANT
DEVICE TROCAR PUNCTURE CLOSURE (ENDOMECHANICALS) IMPLANT
DRAPE LAPAROSCOPIC ABDOMINAL (DRAPES) ×2 IMPLANT
DRAPE WARM FLUID 44X44 (DRAPES) ×2 IMPLANT
DRSG TEGADERM 2-3/8X2-3/4 SM (GAUZE/BANDAGES/DRESSINGS) ×4 IMPLANT
DRSG TEGADERM 4X4.75 (GAUZE/BANDAGES/DRESSINGS) ×2 IMPLANT
ELECT REM PT RETURN 15FT ADLT (MISCELLANEOUS) ×2 IMPLANT
ENDOLOOP SUT PDS II  0 18 (SUTURE)
ENDOLOOP SUT PDS II 0 18 (SUTURE) IMPLANT
GAUZE SPONGE 2X2 8PLY STRL LF (GAUZE/BANDAGES/DRESSINGS) ×1 IMPLANT
GLOVE SURG LTX SZ8 (GLOVE) ×2 IMPLANT
GLOVE SURG UNDER LTX SZ8 (GLOVE) ×2 IMPLANT
GOWN STRL REUS W/TWL XL LVL3 (GOWN DISPOSABLE) ×4 IMPLANT
IRRIG SUCT STRYKERFLOW 2 WTIP (MISCELLANEOUS) ×2
IRRIGATION SUCT STRKRFLW 2 WTP (MISCELLANEOUS) ×1 IMPLANT
KIT BASIN OR (CUSTOM PROCEDURE TRAY) ×2 IMPLANT
KIT TURNOVER KIT A (KITS) ×2 IMPLANT
PAD POSITIONING PINK XL (MISCELLANEOUS) ×2 IMPLANT
PENCIL SMOKE EVACUATOR (MISCELLANEOUS) IMPLANT
POUCH RETRIEVAL ECOSAC 10 (ENDOMECHANICALS) ×1 IMPLANT
POUCH RETRIEVAL ECOSAC 10MM (ENDOMECHANICALS) ×1
RELOAD STAPLER BLUE 60MM (STAPLE) ×1 IMPLANT
RELOAD STAPLER GREEN 60MM (STAPLE) IMPLANT
SCISSORS LAP 5X35 DISP (ENDOMECHANICALS) ×2 IMPLANT
SET TUBE SMOKE EVAC HIGH FLOW (TUBING) ×2 IMPLANT
SHEARS HARMONIC ACE PLUS 36CM (ENDOMECHANICALS) IMPLANT
SLEEVE XCEL OPT CAN 5 100 (ENDOMECHANICALS) ×2 IMPLANT
SPONGE GAUZE 2X2 8PLY STRL LF (GAUZE/BANDAGES/DRESSINGS) ×2 IMPLANT
SPONGE GAUZE 2X2 STER 10/PKG (GAUZE/BANDAGES/DRESSINGS) ×1
STAPLE ECHEON FLEX 60 POW ENDO (STAPLE) ×2 IMPLANT
STAPLER RELOAD BLUE 60MM (STAPLE) ×2
STAPLER RELOAD GREEN 60MM (STAPLE)
SUT MNCRL AB 4-0 PS2 18 (SUTURE) ×2 IMPLANT
SUT PDS AB 0 CT1 36 (SUTURE) IMPLANT
SUT PDS AB 1 CT1 27 (SUTURE) IMPLANT
SUT SILK 2 0 SH (SUTURE) IMPLANT
TOWEL OR 17X26 10 PK STRL BLUE (TOWEL DISPOSABLE) ×2 IMPLANT
TRAY FOLEY MTR SLVR 14FR STAT (SET/KITS/TRAYS/PACK) ×2 IMPLANT
TRAY FOLEY MTR SLVR 16FR STAT (SET/KITS/TRAYS/PACK) IMPLANT
TRAY LAPAROSCOPIC (CUSTOM PROCEDURE TRAY) ×2 IMPLANT
TROCAR BLADELESS OPT 5 100 (ENDOMECHANICALS) ×2 IMPLANT
TROCAR XCEL 12X100 BLDLESS (ENDOMECHANICALS) ×2 IMPLANT

## 2021-03-07 NOTE — Op Note (Signed)
PATIENT:  Gregg [name]  65 y.o. male  Patient Care Team: Patient, No Pcp Per as PCP - General (General Practice)  PRE-OPERATIVE DIAGNOSIS:   ACUTE APPENDICITIS BILATERAL INGUINAL HERNIAS  POST-OPERATIVE DIAGNOSIS:   PERFORATED ACUTE APPENDICITIS WITH PHLEGMON INCARCERATED LEFT INGUINAL HERNIA RIGHT INGUINAL HERNIA  PROCEDURE:   APPENDECTOMY LAPAROSCOPIC REDUCTION OF INGUINAL HERNIAS  SURGEON:  Adin Hector, MD  ANESTHESIA:   local and general  EBL:  Total I/O In: 1692.8 [I.V.:1000; IV Piggyback:692.8] Out: 250 [Urine:200; Blood:50]  Delay start of Pharmacological VTE agent (>24hrs) due to surgical blood loss or risk of bleeding:  no  DRAINS: none   SPECIMEN:  APPENDIX  DISPOSITION OF SPECIMEN:  PATHOLOGY  COUNTS:  YES  PLAN OF CARE: Admit to inpatient   PATIENT DISPOSITION:  PACU - hemodynamically stable.   INDICATIONS: Patient with concerning symptoms & work up suspicious for appendicitis.  Surgery was recommended:  The anatomy & physiology of the digestive tract was discussed.  The pathophysiology of appendicitis was discussed.  Natural history risks without surgery was discussed.   I feel the risks of no intervention will lead to serious problems that outweigh the operative risks; therefore, I recommended diagnostic laparoscopy with removal of appendix to remove the pathology.  Laparoscopic & open techniques were discussed.   I noted a good likelihood this will help address the problem.    Risks such as bleeding, infection, abscess, leak, reoperation, possible ostomy, hernia, heart attack, death, and other risks were discussed.  Goals of post-operative recovery were discussed as well.  We will work to minimize complications.  Questions were answered.  The patient expresses understanding & wishes to proceed with surgery.  OR FINDINGS: Patient had anterior appendix and right lower quadrant with thickened phlegmon and probable perforation.  Some focal peritonitis  as well.  Able to do appendectomy laparoscopically.  Patient has large bilateral indirect inguinal hernias.  Left side incarcerated with sigmoid colon greater omentum.  Right side reducible.  I was able to eventually reduce the left-sided inguinal hernia, mobilized the colon, and repack with some omentum to avoid urgent obstruction.  CASE DATA:  Type of patient?: LDOW CASE (Surgical Hospitalist [facility] Inpatient)  Status of Case? EMERGENT Add On  Infection Present At Time Of Surgery (PATOS)?  PHLEGMON  DESCRIPTION:   The patient was identified & brought into the operating room. The patient was positioned supine with arms tucked. SCDs were active during the entire case. The patient underwent general anesthesia without any difficulty.  The abdomen was prepped and draped in a sterile fashion. A Surgical Timeout confirmed our plan.   I made a transverse incision through the superior umbilical fold.  I made a small transverse nick through the infraumbilical fascia and confirmed peritoneal entry.  Laparoscopic port cannot pass easily so I placed another 5 Miller port in the left upper quadrant using optical entry technique.  Camera inspection revealed no injury.  I placed additional ports under direct laparoscopic visualization.  I could see phlegmon in the right lower quadrant consistent with perforated appendicitis with phlegmon.  Patient had large inguinal hernias.  Left side had sigmoid colon and omentum going up into it.  I was eventually able to reduce it with abdominal wall and laparoscopic pressure.  I mobilized the sigmoid colon in a lateral to medial fashion so that it would fall away from that.  Right inguinal hernia was reducible.  I focused on appendectomy.  I mobilized the terminal ileum to proximal ascending colon in  a lateral to medial fashion.  I took care to avoid injuring any retroperitoneal structures.  I freed the appendix off its attachments to the ascending colon and cecal mesentery.   I elevated the appendix. I skeletonized the mesoappendix. I was able to free off the base of the appendix which was still viable.  I stapled the appendix off the cecum using a laparoscopic stapler. I took a healthy cuff of viable cecum. I ligated the mesoappendix and assured hemostasis in the mesentery.  I placed the appendix inside an EcoSac bag and removed out the 50mm stapler port.  I did copious irrigation. Hemostasis was good in the mesoappendix, colon mesentery, and retroperitoneum. Staple line was intact on the cecum with no bleeding. I washed out the pelvis, retrohepatic space and right paracolic gutter. I washed out the left side as well.  Hemostasis is good. There was no perforation or injury. Because the area cleaned up well after irrigation, I did not place a drain.  I closed the 12 mm stapler port site with a #1 PDS stitch in the LLQ using a suture passer under direct laparoscopic visualization.  I mobilized greater omentum and placed it in the left inguinal region so that it would prevent colon and small bowel to become obstructed into the hernia.  I aspirated the carbon dioxide. I removed the ports.  I closed skin using 4-0 monocryl stitch.  Sterile dressings applied.  Patient was extubated and sent to the recovery room.  I discussed the operative findings with the patient's friend, Jeani Hawking  I suspect the patient is going used in the hospital at least overnight and will need antibiotics for 5 days.  While he did have large inguinal hernias they were ultimately reducible and I did not feel it was wise to try and repair them at the time of a perforated appendectomy.  I do think he would benefit from considering repair in a few months once he is gotten over this since 1 was already incarcerated with colon within it.  We can discuss on postop visit   Questions answered. Jeani Hawking expressed understanding and appreciation.  Adin Hector, M.D., F.A.C.S. Gastrointestinal and Minimally Invasive  [facility] [address], [place], [place] [address] [phone] Main / Paging  03/07/2021 6:18 PM

## 2021-03-07 NOTE — ED Provider Notes (Signed)
[name]   CSN: [id number] Arrival date & time: 03/07/21  3329     History Chief Complaint  Patient presents with  . Abdominal Pain    Gregg [name] is a 65 y.o. male.  65 year old male with prior medical history as detailed below presents for evaluation of right lower quadrant abdominal pain.  Patient reports acute onset of right lower quadrant dental pain overnight.  Patient denies associated fever, nausea, vomiting, urinary symptoms, or bowel movement change.  Patient denies prior history of appendicitis.  Patient does report prior history of bilateral inguinal hernias.  These are easily reducible.  He denies pain at his inguinal hernia sites.  The history is provided by the patient and medical records.  Abdominal Pain Pain location:  RLQ Pain quality: cramping   Pain radiates to:  Does not radiate Pain severity:  Moderate Onset quality:  Sudden Duration:  8 hours Timing:  Constant Progression:  Unchanged Chronicity:  New      Past Medical History:  Diagnosis Date  . History of kidney stones     There are no problems to display for this patient.   Past Surgical History:  Procedure Laterality Date  . EXTRACORPOREAL SHOCK WAVE LITHOTRIPSY Left 02/23/2019   Procedure: EXTRACORPOREAL SHOCK WAVE LITHOTRIPSY (ESWL);  Surgeon: Bjorn Loser, MD;  Location: [facility];  Service: Urology;  Laterality: Left;  . NO PAST SURGERIES         No family history on file.  Social History   Tobacco Use  . Smoking status: Former Smoker    Years: 40.00    Quit date: 03/29/2017    Years since quitting: 3.9  . Smokeless tobacco: Never Used  Vaping Use  . Vaping Use: Never used  Substance Use Topics  . Alcohol use: Yes    Comment: rare  . Drug use: Never    Home Medications Prior to Admission medications   Medication Sig Start Date End Date Taking? Authorizing Provider  dextromethorphan-guaiFENesin (MUCINEX DM) 30-600 MG  12hr tablet Take 1 tablet by mouth daily as needed (for mucus).   Yes [provider]  hydrocortisone cream 1 % Apply 1 application topically daily as needed for itching.   Yes [provider]  multivitamin (ONE-A-DAY MEN'S) TABS tablet Take 1 tablet by mouth daily.   Yes [provider]  naproxen sodium (ALEVE) 220 MG tablet Take 440 mg by mouth as needed (pain).   Yes [provider]  Pseudoephedrine-APAP-DM (TYLENOL COLD/FLU DAY PO) Take 1 tablet by mouth 2 (two) times daily as needed (for cough or congestion).   Yes [provider]    Allergies    Patient has no known allergies.  Review of Systems   Review of Systems  Gastrointestinal: Positive for abdominal pain.  All other systems reviewed and are negative.   Physical Exam Updated Vital Signs BP (!) 145/95 (BP Location: Left Arm)   Pulse (!) 122   Temp 98.5 F (36.9 C)   Resp 18   Ht 5\' 9"  (1.753 m)   Wt 70 kg   SpO2 96%   BMI 22.79 kg/m   Physical Exam Vitals and nursing note reviewed.  Constitutional:      General: He is not in acute distress.    Appearance: He is well-developed.  HENT:     Head: Normocephalic and atraumatic.  Eyes:     Conjunctiva/sclera: Conjunctivae normal.     Pupils: Pupils are equal, round, and reactive to  light.  Cardiovascular:     Rate and Rhythm: Normal rate and regular rhythm.     Heart sounds: Normal heart sounds.  Pulmonary:     Effort: Pulmonary effort is normal. No respiratory distress.     Breath sounds: Normal breath sounds.  Abdominal:     General: There is no distension.     Palpations: Abdomen is soft.     Tenderness: There is abdominal tenderness in the right lower quadrant.     Comments: Moderate tenderness in the right lower quadrant.  Bilateral inguinal hernias are soft and easily reducible.    Musculoskeletal:        General: No deformity. Normal range of motion.     Cervical back: Normal range of motion and neck  supple.  Skin:    General: Skin is warm and dry.  Neurological:     Mental Status: He is alert and oriented to person, place, and time.     ED Results / Procedures / Treatments   Labs (all labs ordered are listed, but only abnormal results are displayed) Labs Reviewed  COMPREHENSIVE METABOLIC PANEL - Abnormal; Notable for the following components:      Result Value   Potassium 3.4 (*)    Glucose, Bld 123 (*)    Total Protein 8.2 (*)    All other components within normal limits  CBC - Abnormal; Notable for the following components:   WBC 14.7 (*)    Hemoglobin 17.5 (*)    HCT 52.1 (*)    All other components within normal limits  URINALYSIS, ROUTINE W REFLEX MICROSCOPIC - Abnormal; Notable for the following components:   Color, Urine AMBER (*)    APPearance TURBID (*)    Hgb urine dipstick SMALL (*)    Ketones, ur 5 (*)    Bacteria, UA RARE (*)    All other components within normal limits  RESP PANEL BY RT-PCR (FLU A&B, COVID) ARPGX2  LIPASE, BLOOD    EKG None  Radiology CT ABDOMEN PELVIS W CONTRAST  Result Date: 03/07/2021 CLINICAL DATA:  65 year old male with right lower quadrant abdominal pain since 0400 hours. History of kidney and gallstones. EXAM: CT ABDOMEN AND PELVIS WITH CONTRAST TECHNIQUE: Multidetector CT imaging of the abdomen and pelvis was performed using the standard protocol following bolus administration of intravenous contrast. CONTRAST:  155mL OMNIPAQUE IOHEXOL 300 MG/ML  SOLN COMPARISON:  CT Abdomen and Pelvis 02/19/2019. FINDINGS: Lower chest: There is distal peribronchial thickening and some opacification of the airways in the right lower lobe, not significantly changed since 2020. Small lateral segment right middle lobe and anterior basal segment right lower lobe lung nodules have resolved and/or inflammatory. No pleural effusion. Hepatobiliary: Chronic gallstones. No gallbladder inflammation. Negative liver. No bile duct enlargement. Pancreas: Negative.  Spleen: Negative. Adrenals/Urinary Tract: Negative adrenal glands. Benign appearing chronic calcification of the left renal upper pole is stable. Occasional small left renal cysts are present (posterior left midpole). Symmetric renal enhancement and contrast excretion with decompressed proximal ureters. Negative bladder. Stomach/Bowel: Negative rectum. Portion of the sigmoid colon is herniated into the left inguinal canal, detailed below. Upstream descending colon is decompressed. Negative transverse colon. Redundant right colon, with the cecum on a lax mesentery located in the midline deep to the umbilicus. Abnormal appendix arises from the inferior cecum on coronal image 43 and extends laterally. Appendix: Location: Located inferior and right lateral to cecum which is on a lax mesentery and in the midline Diameter: Up to 16 mm Appendicolith:  Multiple small (coronal image 47). Mucosal hyper-enhancement: Positive Extraluminal gas: Negative. Periappendiceal collection: Peri appendiceal phlegmon (series 2, image 55) but no organized fluid collection at this time. Decompressed terminal ileum. No dilated small bowel. Negative stomach and duodenum. No free air. Vascular/Lymphatic: Extensive Aortoiliac calcified atherosclerosis. Major arterial structures remain patent. Portal venous system is patent. Reproductive: Previously fat containing left inguinal hernia now contains a segment of the sigmoid colon which is un incarcerated. The hernia is now roughly 10 cm in length. Otherwise negative. Other: Small volume of pelvic free fluid with simple fluid density. Musculoskeletal: No acute osseous abnormality identified. IMPRESSION: 1. Positive for Acute Appendicitis. Severely inflamed appendix tracking caudal to the right from a midline cecum which appears to beyond a lax mesentery. Peri-appendiceal phlegmon and small volume reactive appearing free fluid in the pelvis without perforation or abscess at this time. 2. Progressed  chronic left inguinal hernia, now containing a segment of the sigmoid colon without incarceration. 3. Inflammatory appearing thickening of the right lower lobe airways. Cholelithiasis. Aortic Atherosclerosis (ICD10-I70.0). Electronically Signed   By: Genevie Ann M.D.   On: 03/07/2021 11:34    Procedures Procedures   Medications Ordered in ED Medications  ondansetron (ZOFRAN) injection 4 mg (has no administration in time range)  sodium chloride 0.9 % bolus 500 mL (has no administration in time range)  morphine 4 MG/ML injection 4 mg (has no administration in time range)  0.9 %  sodium chloride infusion (has no administration in time range)  piperacillin-tazobactam (ZOSYN) IVPB 3.375 g (has no administration in time range)  iohexol (OMNIPAQUE) 300 MG/ML solution 100 mL (100 mLs Intravenous Contrast Given 03/07/21 1108)    ED Course  I have reviewed the triage vital signs and the nursing notes.  Pertinent labs & imaging results that were available during my care of the patient were reviewed by me and considered in my medical decision making (see chart for details).    MDM Rules/Calculators/A&P                          MDM  Screen complete  Elisha Cooksey was evaluated in Emergency Department on 03/07/2021 for the symptoms described in the history of present illness. He was evaluated in the context of the global COVID-19 pandemic, which necessitated consideration that the patient might be at risk for infection with the SARS-CoV-2 virus that causes COVID-19. Institutional protocols and algorithms that pertain to the evaluation of patients at risk for COVID-19 are in a state of rapid change based on information released by regulatory bodies including the CDC and federal and state organizations. These policies and algorithms were followed during the patient's care in the ED.  Patient is presenting with right lower quadrant abdominal pain.  Patient's CT imaging is suggestive of acute  appendicitis.  Antibiotics initiated in the ED.  General surgery is aware of case and will evaluate.   Final Clinical Impression(s) / ED Diagnoses Final diagnoses:  Acute appendicitis, unspecified acute appendicitis type    Rx / DC Orders ED Discharge Orders    None       Valarie Merino, MD 03/07/21 1204

## 2021-03-07 NOTE — Interval H&P Note (Signed)
History and Physical Interval Note:  03/07/2021 3:48 PM  Gregg [name]  has presented today for surgery, with the diagnosis of ACUTE APPENCITIS.  The various methods of treatment have been discussed with the patient and family. After consideration of risks, benefits and other options for treatment, the patient has consented to  Procedure(s): APPENDECTOMY LAPAROSCOPIC (N/A) as a surgical intervention.  The patient's history has been reviewed, patient examined, no change in status, stable for surgery.  I have reviewed the patient's chart and labs.  Questions were answered to the patient's satisfaction.    I have re-reviewed the the patient's records, history, medications, and allergies.  I have re-examined the patient.  I again discussed intraoperative plans and goals of post-operative recovery.  The patient agrees to proceed.  Gregg [name]  [birth year] [phone]  Patient Care Team: Patient, No Pcp Per as PCP - General (General Practice)  There are no problems to display for this patient.   Past Medical History:  Diagnosis Date   History of kidney stones     Past Surgical History:  Procedure Laterality Date   EXTRACORPOREAL SHOCK WAVE LITHOTRIPSY Left 02/23/2019   Procedure: EXTRACORPOREAL SHOCK WAVE LITHOTRIPSY (ESWL);  Surgeon: Bjorn Loser, MD;  Location: [facility];  Service: Urology;  Laterality: Left;   NO PAST SURGERIES      Social History   Socioeconomic History   Marital status: Single    Spouse name: Not on file   Number of children: Not on file   Years of education: Not on file   Highest education level: Not on file  Occupational History   Not on file  Tobacco Use   Smoking status: Former Smoker    Years: 40.00    Quit date: 03/29/2017    Years since quitting: 3.9   Smokeless tobacco: Never Used  Vaping Use   Vaping Use: Never used  Substance and Sexual Activity   Alcohol use: Yes    Comment: rare   Drug use: Never   Sexual activity: Not on file  Other Topics  Concern   Not on file  Social History Narrative   Not on file   Social Determinants of Health   Financial Resource Strain: Not on file  Food Insecurity: Not on file  Transportation Needs: Not on file  Physical Activity: Not on file  Stress: Not on file  Social Connections: Not on file  Intimate Partner Violence: Not on file    History reviewed. No pertinent family history.  Medications Prior to Admission  Medication Sig Dispense Refill Last Dose   dextromethorphan-guaiFENesin (MUCINEX DM) 30-600 MG 12hr tablet Take 1 tablet by mouth daily as needed (for mucus).   Past Month at Unknown time   hydrocortisone cream 1 % Apply 1 application topically daily as needed for itching.   02/23/2021   multivitamin (ONE-A-DAY MEN'S) TABS tablet Take 1 tablet by mouth daily.   Past Week at Unknown time   naproxen sodium (ALEVE) 220 MG tablet Take 440 mg by mouth as needed (pain).   03/07/2021 at 0400   Pseudoephedrine-APAP-DM (TYLENOL COLD/FLU DAY PO) Take 1 tablet by mouth 2 (two) times daily as needed (for cough or congestion).   Past Month at Unknown time    Current Facility-Administered Medications  Medication Dose Route Frequency Provider Last Rate Last Admin   lactated ringers infusion   Intravenous Continuous Albertha Ghee, MD 50 mL/hr at 03/07/21 1530 New Bag at 03/07/21 1530     No Known Allergies  BP Marland Kitchen)  156/94   Pulse (!) 106   Temp 99.7 F (37.6 C) (Oral)   Resp 16   Ht 5\' 9"  (1.753 m)   Wt 70 kg   SpO2 94%   BMI 22.79 kg/m   Labs: Results for orders placed or performed during the hospital encounter of 03/07/21 (from the past 48 hour(s))  Lipase, blood     Status: None   Collection Time: 03/07/21  9:00 AM  Result Value Ref Range   Lipase 46 11 - 51 U/L    Comment: Performed at [facility], [place] [address], [place], [place] [address]  Comprehensive metabolic panel     Status: Abnormal   Collection Time: 03/07/21  9:00 AM  Result Value Ref Range    Sodium 139 135 - 145 mmol/L   Potassium 3.4 (L) 3.5 - 5.1 mmol/L   Chloride 100 98 - 111 mmol/L   CO2 27 22 - 32 mmol/L   Glucose, Bld 123 (H) 70 - 99 mg/dL    Comment: Glucose reference range applies only to samples taken after fasting for at least 8 hours.   BUN 18 8 - 23 mg/dL   Creatinine, Ser 1.08 0.61 - 1.24 mg/dL   Calcium 9.8 8.9 - 10.3 mg/dL   Total Protein 8.2 (H) 6.5 - 8.1 g/dL   Albumin 4.5 3.5 - 5.0 g/dL   AST 19 15 - 41 U/L   ALT 18 0 - 44 U/L   Alkaline Phosphatase 70 38 - 126 U/L   Total Bilirubin 1.0 0.3 - 1.2 mg/dL   GFR, Estimated >60 >60 mL/min    Comment: (NOTE) Calculated using the CKD-EPI Creatinine Equation (2021)    Anion gap 12 5 - 15    Comment: Performed at [facility], [place] [address], [place], [place] [address]  CBC     Status: Abnormal   Collection Time: 03/07/21  9:00 AM  Result Value Ref Range   WBC 14.7 (H) 4.0 - 10.5 K/uL   RBC 5.57 4.22 - 5.81 MIL/uL   Hemoglobin 17.5 (H) 13.0 - 17.0 g/dL   HCT 52.1 (H) 39.0 - 52.0 %   MCV 93.5 80.0 - 100.0 fL   MCH 31.4 26.0 - 34.0 pg   MCHC 33.6 30.0 - 36.0 g/dL   RDW 12.5 11.5 - 15.5 %   Platelets 272 150 - 400 K/uL   nRBC 0.0 0.0 - 0.2 %    Comment: Performed at [facility], [place] [address] [place], [place] [address]  Urinalysis, Routine w reflex microscopic Urine, Clean Catch     Status: Abnormal   Collection Time: 03/07/21  9:00 AM  Result Value Ref Range   Color, Urine AMBER (A) YELLOW    Comment: BIOCHEMICALS MAY BE AFFECTED BY COLOR   APPearance TURBID (A) CLEAR   Specific Gravity, Urine 1.018 1.005 - 1.030   pH 7.0 5.0 - 8.0   Glucose, UA NEGATIVE NEGATIVE mg/dL   Hgb urine dipstick SMALL (A) NEGATIVE   Bilirubin Urine NEGATIVE NEGATIVE   Ketones, ur 5 (A) NEGATIVE mg/dL   Protein, ur NEGATIVE NEGATIVE mg/dL   Nitrite NEGATIVE NEGATIVE   Leukocytes,Ua NEGATIVE NEGATIVE   RBC / HPF 11-20 0 - 5 RBC/hpf   Bacteria, UA RARE (A) NONE SEEN   Mucus  PRESENT     Comment: Performed at [facility], [place] [address] [place], [place] [address]  Resp Panel by RT-PCR (Flu A&B, Covid) Nasopharyngeal Swab  Status: None   Collection Time: 03/07/21 12:23 PM   Specimen: Nasopharyngeal Swab; Nasopharyngeal(NP) swabs in vial transport medium  Result Value Ref Range   SARS Coronavirus 2 by RT PCR NEGATIVE NEGATIVE    Comment: (NOTE) SARS-CoV-2 target nucleic acids are NOT DETECTED.  The SARS-CoV-2 RNA is generally detectable in upper respiratory specimens during the acute phase of infection. The lowest concentration of SARS-CoV-2 viral copies this assay can detect is 138 copies/mL. A negative result does not preclude SARS-Cov-2 infection and should not be used as the sole basis for treatment or other patient management decisions. A negative result may occur with  improper specimen collection/handling, submission of specimen other than nasopharyngeal swab, presence of viral mutation(s) within the areas targeted by this assay, and inadequate number of viral copies(<138 copies/mL). A negative result must be combined with clinical observations, patient history, and epidemiological information. The expected result is Negative.  Fact Sheet for Patients:  EntrepreneurPulse.com.au  Fact Sheet for Healthcare Providers:  IncredibleEmployment.be  This test is no t yet approved or cleared by the [place] FDA and  has been authorized for detection and/or diagnosis of SARS-CoV-2 by FDA under an Emergency Use Authorization (EUA). This EUA will remain  in effect (meaning this test can be used) for the duration of the COVID-19 declaration under Section 564(b)(1) of the Act, 21 U.S.C.section 360bbb-3(b)(1), unless the authorization is terminated  or revoked sooner.       Influenza A by PCR NEGATIVE NEGATIVE   Influenza B by PCR NEGATIVE NEGATIVE    Comment: (NOTE) The Xpert Xpress  SARS-CoV-2/FLU/RSV plus assay is intended as an aid in the diagnosis of influenza from Nasopharyngeal swab specimens and should not be used as a sole basis for treatment. Nasal washings and aspirates are unacceptable for Xpert Xpress SARS-CoV-2/FLU/RSV testing.  Fact Sheet for Patients: EntrepreneurPulse.com.au  Fact Sheet for Healthcare Providers: IncredibleEmployment.be  This test is not yet approved or cleared by the [place] FDA and has been authorized for detection and/or diagnosis of SARS-CoV-2 by FDA under an Emergency Use Authorization (EUA). This EUA will remain in effect (meaning this test can be used) for the duration of the COVID-19 declaration under Section 564(b)(1) of the Act, 21 U.S.C. section 360bbb-3(b)(1), unless the authorization is terminated or revoked.  Performed at [facility], [place] [address] [place], [place] [address]     Imaging / Studies: CT ABDOMEN PELVIS W CONTRAST  Result Date: 03/07/2021 CLINICAL DATA:  65 year old male with right lower quadrant abdominal pain since 0400 hours. History of kidney and gallstones. EXAM: CT ABDOMEN AND PELVIS WITH CONTRAST TECHNIQUE: Multidetector CT imaging of the abdomen and pelvis was performed using the standard protocol following bolus administration of intravenous contrast. CONTRAST:  12mL OMNIPAQUE IOHEXOL 300 MG/ML  SOLN COMPARISON:  CT Abdomen and Pelvis 02/19/2019. FINDINGS: Lower chest: There is distal peribronchial thickening and some opacification of the airways in the right lower lobe, not significantly changed since 2020. Small lateral segment right middle lobe and anterior basal segment right lower lobe lung nodules have resolved and/or inflammatory. No pleural effusion. Hepatobiliary: Chronic gallstones. No gallbladder inflammation. Negative liver. No bile duct enlargement. Pancreas: Negative. Spleen: Negative. Adrenals/Urinary Tract: Negative adrenal  glands. Benign appearing chronic calcification of the left renal upper pole is stable. Occasional small left renal cysts are present (posterior left midpole). Symmetric renal enhancement and contrast excretion with decompressed proximal ureters. Negative bladder. Stomach/Bowel: Negative rectum. Portion of the sigmoid colon is herniated into the left  inguinal canal, detailed below. Upstream descending colon is decompressed. Negative transverse colon. Redundant right colon, with the cecum on a lax mesentery located in the midline deep to the umbilicus. Abnormal appendix arises from the inferior cecum on coronal image 43 and extends laterally. Appendix: Location: Located inferior and right lateral to cecum which is on a lax mesentery and in the midline Diameter: Up to 16 mm Appendicolith: Multiple small (coronal image 47). Mucosal hyper-enhancement: Positive Extraluminal gas: Negative. Periappendiceal collection: Peri appendiceal phlegmon (series 2, image 55) but no organized fluid collection at this time. Decompressed terminal ileum. No dilated small bowel. Negative stomach and duodenum. No free air. Vascular/Lymphatic: Extensive Aortoiliac calcified atherosclerosis. Major arterial structures remain patent. Portal venous system is patent. Reproductive: Previously fat containing left inguinal hernia now contains a segment of the sigmoid colon which is un incarcerated. The hernia is now roughly 10 cm in length. Otherwise negative. Other: Small volume of pelvic free fluid with simple fluid density. Musculoskeletal: No acute osseous abnormality identified. IMPRESSION: 1. Positive for Acute Appendicitis. Severely inflamed appendix tracking caudal to the right from a midline cecum which appears to beyond a lax mesentery. Peri-appendiceal phlegmon and small volume reactive appearing free fluid in the pelvis without perforation or abscess at this time. 2. Progressed chronic left inguinal hernia, now containing a segment of  the sigmoid colon without incarceration. 3. Inflammatory appearing thickening of the right lower lobe airways. Cholelithiasis. Aortic Atherosclerosis (ICD10-I70.0). Electronically Signed   By: Genevie Ann M.D.   On: 03/07/2021 11:34     .Adin Hector, M.D., F.A.C.S. Gastrointestinal and Minimally Invasive Surgery [facility] [address], [place], [place] [address] [phone] Main / Paging  03/07/2021 3:48 PM     Adin Hector

## 2021-03-07 NOTE — Progress Notes (Signed)
A consult was received from an ED physician for zosyn per pharmacy dosing.  The patient's profile has been reviewed for ht/wt/allergies/indication/available labs.   A one time order has been placed for zosyn 3.375g.  Further antibiotics/pharmacy consults should be ordered by admitting physician if indicated.                       Thank you, Peggyann Juba, PharmD, BCPS 03/07/2021  11:46 AM

## 2021-03-07 NOTE — ED Triage Notes (Signed)
Patient complains of R lower abdominal / upper pelvic pain, woke him up from sleep around 4am. Has a hx of kidney stones and inguinal hernias. Denies hernia being difficult to reduce. Denies N/V/D, denies fevers. Denies difficulty urinating, blood in urine.

## 2021-03-07 NOTE — Anesthesia Procedure Notes (Signed)
Procedure Name: Intubation Performed by: Milford Cage, CRNA Pre-anesthesia Checklist: Patient identified, Emergency Drugs available, Suction available and Patient being monitored Patient Re-evaluated:Patient Re-evaluated prior to induction Oxygen Delivery Method: Circle System Utilized Preoxygenation: Pre-oxygenation with 100% oxygen Induction Type: IV induction Ventilation: Mask ventilation without difficulty Laryngoscope Size: Miller and 2 Grade View: Grade I Tube type: Oral Tube size: 7.5 mm Number of attempts: 1 Airway Equipment and Method: Stylet and Oral airway Placement Confirmation: ETT inserted through vocal cords under direct vision,  positive ETCO2 and breath sounds checked- equal and bilateral Secured at: 22 cm Tube secured with: Tape Dental Injury: Teeth and Oropharynx as per pre-operative assessment

## 2021-03-07 NOTE — H&P (Signed)
Admission Note  Gregg [name] 1956-03-21  [id number].    Requesting MD: Dr. Dene Gentry Chief Complaint/Reason for Consult: Acute appendicitis   HPI:  Patient is a 65 year old male who presented to [facility] with abdominal pain. Pain started around 3:30 this AM. Patient woke up to take his dog outside and had a mild pain on right side. He went back to bed and then around 4:00 AM was having severe RLQ pain. Denies nausea, vomiting, fever, chills, chest pain, SOB, urinary symptoms. Patient is otherwise healthy, takes no daily medications. He has never had abdominal surgery previously. NKDA. Drinks rarely, stopped smoking 4 years ago and denies illicit drug use. He is not currently working.   ROS: Review of Systems  Constitutional: Negative for chills and fever.  Respiratory: Negative for shortness of breath and wheezing.   Cardiovascular: Negative for chest pain and palpitations.  Gastrointestinal: Positive for abdominal pain. Negative for blood in stool, constipation, diarrhea, nausea and vomiting.  Genitourinary: Negative for dysuria, frequency and urgency.  All other systems reviewed and are negative.   No family history on file.  Past Medical History:  Diagnosis Date  . History of kidney stones     Past Surgical History:  Procedure Laterality Date  . EXTRACORPOREAL SHOCK WAVE LITHOTRIPSY Left 02/23/2019   Procedure: EXTRACORPOREAL SHOCK WAVE LITHOTRIPSY (ESWL);  Surgeon: Bjorn Loser, MD;  Location: [facility];  Service: Urology;  Laterality: Left;  . NO PAST SURGERIES      Social History:  reports that he quit smoking about 3 years ago. He quit after 40.00 years of use. He has never used smokeless tobacco. He reports current alcohol use. He reports that he does not use drugs.  Allergies: No Known Allergies  (Not in a hospital admission)   Blood pressure (!) 151/92, pulse (!) 116, temperature 98.2 F (36.8 C), temperature source Oral, resp. rate 16, height 5\' 9"  (1.753  m), weight 70 kg, SpO2 93 %. Physical Exam:  General: pleasant, WD, thin male who is laying in bed in NAD HEENT: head is normocephalic, atraumatic.  Sclera are noninjected.  PERRL.  Ears and nose without any masses or lesions.  Mouth is pink and moist Heart: sinus tachycardia.  Normal s1,s2. No obvious murmurs, gallops, or rubs noted.  Palpable radial and pedal pulses bilaterally Lungs: CTAB, no wheezes, rhonchi, or rales noted.  Respiratory effort nonlabored Abd: soft, TTP in RLQ without peritonitis, ND, +BS, bilateral reducible inguinal hernias L>R  MS: all 4 extremities are symmetrical with no cyanosis, clubbing, or edema. Skin: warm and dry with no masses, lesions, or rashes Neuro: Cranial nerves 2-12 grossly intact, sensation is normal throughout Psych: A&Ox3 with an appropriate affect.   Results for orders placed or performed during the hospital encounter of 03/07/21 (from the past 48 hour(s))  Lipase, blood     Status: None   Collection Time: 03/07/21  9:00 AM  Result Value Ref Range   Lipase 46 11 - 51 U/L    Comment: Performed at [facility], [place] [address], [place], [place] [address]  Comprehensive metabolic panel     Status: Abnormal   Collection Time: 03/07/21  9:00 AM  Result Value Ref Range   Sodium 139 135 - 145 mmol/L   Potassium 3.4 (L) 3.5 - 5.1 mmol/L   Chloride 100 98 - 111 mmol/L   CO2 27 22 - 32 mmol/L   Glucose, Bld 123 (H) 70 - 99 mg/dL  Comment: Glucose reference range applies only to samples taken after fasting for at least 8 hours.   BUN 18 8 - 23 mg/dL   Creatinine, Ser 1.08 0.61 - 1.24 mg/dL   Calcium 9.8 8.9 - 10.3 mg/dL   Total Protein 8.2 (H) 6.5 - 8.1 g/dL   Albumin 4.5 3.5 - 5.0 g/dL   AST 19 15 - 41 U/L   ALT 18 0 - 44 U/L   Alkaline Phosphatase 70 38 - 126 U/L   Total Bilirubin 1.0 0.3 - 1.2 mg/dL   GFR, Estimated >60 >60 mL/min    Comment: (NOTE) Calculated using the CKD-EPI Creatinine Equation (2021)    Anion gap 12  5 - 15    Comment: Performed at [facility], [place] [address], [place], [place] [address]  CBC     Status: Abnormal   Collection Time: 03/07/21  9:00 AM  Result Value Ref Range   WBC 14.7 (H) 4.0 - 10.5 K/uL   RBC 5.57 4.22 - 5.81 MIL/uL   Hemoglobin 17.5 (H) 13.0 - 17.0 g/dL   HCT 52.1 (H) 39.0 - 52.0 %   MCV 93.5 80.0 - 100.0 fL   MCH 31.4 26.0 - 34.0 pg   MCHC 33.6 30.0 - 36.0 g/dL   RDW 12.5 11.5 - 15.5 %   Platelets 272 150 - 400 K/uL   nRBC 0.0 0.0 - 0.2 %    Comment: Performed at [facility], [place] [address], [place], [place] [address]  Urinalysis, Routine w reflex microscopic Urine, Clean Catch     Status: Abnormal   Collection Time: 03/07/21  9:00 AM  Result Value Ref Range   Color, Urine AMBER (A) YELLOW    Comment: BIOCHEMICALS MAY BE AFFECTED BY COLOR   APPearance TURBID (A) CLEAR   Specific Gravity, Urine 1.018 1.005 - 1.030   pH 7.0 5.0 - 8.0   Glucose, UA NEGATIVE NEGATIVE mg/dL   Hgb urine dipstick SMALL (A) NEGATIVE   Bilirubin Urine NEGATIVE NEGATIVE   Ketones, ur 5 (A) NEGATIVE mg/dL   Protein, ur NEGATIVE NEGATIVE mg/dL   Nitrite NEGATIVE NEGATIVE   Leukocytes,Ua NEGATIVE NEGATIVE   RBC / HPF 11-20 0 - 5 RBC/hpf   Bacteria, UA RARE (A) NONE SEEN   Mucus PRESENT     Comment: Performed at [facility], [place] [address] [place], [place] [address]   CT ABDOMEN PELVIS W CONTRAST  Result Date: 03/07/2021 CLINICAL DATA:  65 year old male with right lower quadrant abdominal pain since 0400 hours. History of kidney and gallstones. EXAM: CT ABDOMEN AND PELVIS WITH CONTRAST TECHNIQUE: Multidetector CT imaging of the abdomen and pelvis was performed using the standard protocol following bolus administration of intravenous contrast. CONTRAST:  144mL OMNIPAQUE IOHEXOL 300 MG/ML  SOLN COMPARISON:  CT Abdomen and Pelvis 02/19/2019. FINDINGS: Lower chest: There is distal peribronchial thickening and some opacification of  the airways in the right lower lobe, not significantly changed since 2020. Small lateral segment right middle lobe and anterior basal segment right lower lobe lung nodules have resolved and/or inflammatory. No pleural effusion. Hepatobiliary: Chronic gallstones. No gallbladder inflammation. Negative liver. No bile duct enlargement. Pancreas: Negative. Spleen: Negative. Adrenals/Urinary Tract: Negative adrenal glands. Benign appearing chronic calcification of the left renal upper pole is stable. Occasional small left renal cysts are present (posterior left midpole). Symmetric renal enhancement and contrast excretion with decompressed proximal ureters. Negative bladder. Stomach/Bowel: Negative rectum. Portion of the sigmoid colon is herniated into  the left inguinal canal, detailed below. Upstream descending colon is decompressed. Negative transverse colon. Redundant right colon, with the cecum on a lax mesentery located in the midline deep to the umbilicus. Abnormal appendix arises from the inferior cecum on coronal image 43 and extends laterally. Appendix: Location: Located inferior and right lateral to cecum which is on a lax mesentery and in the midline Diameter: Up to 16 mm Appendicolith: Multiple small (coronal image 47). Mucosal hyper-enhancement: Positive Extraluminal gas: Negative. Periappendiceal collection: Peri appendiceal phlegmon (series 2, image 55) but no organized fluid collection at this time. Decompressed terminal ileum. No dilated small bowel. Negative stomach and duodenum. No free air. Vascular/Lymphatic: Extensive Aortoiliac calcified atherosclerosis. Major arterial structures remain patent. Portal venous system is patent. Reproductive: Previously fat containing left inguinal hernia now contains a segment of the sigmoid colon which is un incarcerated. The hernia is now roughly 10 cm in length. Otherwise negative. Other: Small volume of pelvic free fluid with simple fluid density. Musculoskeletal:  No acute osseous abnormality identified. IMPRESSION: 1. Positive for Acute Appendicitis. Severely inflamed appendix tracking caudal to the right from a midline cecum which appears to beyond a lax mesentery. Peri-appendiceal phlegmon and small volume reactive appearing free fluid in the pelvis without perforation or abscess at this time. 2. Progressed chronic left inguinal hernia, now containing a segment of the sigmoid colon without incarceration. 3. Inflammatory appearing thickening of the right lower lobe airways. Cholelithiasis. Aortic Atherosclerosis (ICD10-I70.0). Electronically Signed   By: Genevie Ann M.D.   On: 03/07/2021 11:34      Assessment/Plan Acute appendicitis - CT with inflamed appendix and possible periappendiceal phlegmon, no perforation or abscess noted - WBC 14.7, afebrile, tachycardic - to OR for laparoscopic appendectomy  - will admit to observation post-operatively if patient is unable to be discharged from [place], [facility] 03/07/2021, 12:02 PM Please see Amion for pager number during day hours 7:00am-4:30pm

## 2021-03-07 NOTE — Anesthesia Preprocedure Evaluation (Signed)
Anesthesia Evaluation  Patient identified by MRN, date of birth, ID band Patient awake    Reviewed: Allergy & Precautions, H&P , NPO status , Patient's Chart, lab work & pertinent test results  Airway Mallampati: II   Neck ROM: full    Dental   Pulmonary former smoker,    breath sounds clear to auscultation       Cardiovascular negative cardio ROS   Rhythm:regular Rate:Normal     Neuro/Psych    GI/Hepatic appendicitis   Endo/Other    Renal/GU      Musculoskeletal   Abdominal   Peds  Hematology   Anesthesia Other Findings   Reproductive/Obstetrics                             Anesthesia Physical Anesthesia Plan  ASA: II  Anesthesia Plan: General   Post-op Pain Management:    Induction: Intravenous  PONV Risk Score and Plan: 2 and Dexamethasone, Ondansetron, Midazolam and Treatment may vary due to age or medical condition  Airway Management Planned: Oral ETT  Additional Equipment:   Intra-op Plan:   Post-operative Plan: Extubation in OR  Informed Consent: I have reviewed the patients History and Physical, chart, labs and discussed the procedure including the risks, benefits and alternatives for the proposed anesthesia with the patient or authorized representative who has indicated his/her understanding and acceptance.     Dental advisory given  Plan Discussed with: CRNA, Anesthesiologist and Surgeon  Anesthesia Plan Comments:         Anesthesia Quick Evaluation

## 2021-03-07 NOTE — Transfer of Care (Signed)
Immediate Anesthesia Transfer of Care Note  Patient: Gregg [name]  Procedure(s) Performed: APPENDECTOMY LAPAROSCOPIC (N/A Abdomen)  Patient Location: PACU  Anesthesia Type:General  Level of Consciousness: drowsy  Airway & Oxygen Therapy: Patient Spontanous Breathing and Patient connected to face mask oxygen  Post-op Assessment: Report given to RN and Post -op Vital signs reviewed and stable  Post vital signs: Reviewed and stable  Last Vitals:  Vitals Value Taken Time  BP    Temp    Pulse    Resp    SpO2      Last Pain:  Vitals:   03/07/21 1535  TempSrc: Oral  PainSc: 2          Complications: No complications documented.

## 2021-03-08 LAB — HIV ANTIBODY (ROUTINE TESTING W REFLEX): HIV Screen 4th Generation wRfx: NONREACTIVE

## 2021-03-08 MED ORDER — AMOXICILLIN-POT CLAVULANATE 875-125 MG PO TABS: 1.0000 | ORAL_TABLET | Freq: Two times a day (BID) | ORAL | 0 refills | Status: AC

## 2021-03-08 MED ORDER — OXYCODONE HCL 5 MG PO TABS: 5.0000 mg | ORAL_TABLET | Freq: Four times a day (QID) | ORAL | 0 refills | Status: DC | PRN

## 2021-03-08 NOTE — Progress Notes (Signed)
Assessment unchanged. Pt verbalized understanding of dc instructions through teach back. Discharged via wc to front entrance accompanied by NT.

## 2021-03-08 NOTE — Discharge Instructions (Signed)
[facility] LAPAROSCOPIC SURGERY: POST OP INSTRUCTIONS Always review your discharge instruction sheet given to you by the facility where your surgery was performed. IF YOU HAVE DISABILITY OR FAMILY LEAVE FORMS, YOU MUST BRING THEM TO THE OFFICE FOR PROCESSING.   DO NOT GIVE THEM TO YOUR DOCTOR.  PAIN CONTROL  1. First take acetaminophen (Tylenol) AND/or ibuprofen (Advil) to control your pain after surgery.  Follow directions on package.  Taking acetaminophen (Tylenol) and/or ibuprofen (Advil) regularly after surgery will help to control your pain and lower the amount of prescription pain medication you may need.  You should not take more than 3,000 mg (3 grams) of acetaminophen (Tylenol) in 24 hours.  You should not take ibuprofen (Advil), aleve, motrin, naprosyn or other NSAIDS if you have a history of stomach ulcers or chronic kidney disease.  2. A prescription for pain medication may be given to you upon discharge.  Take your pain medication as prescribed, if you still have uncontrolled pain after taking acetaminophen (Tylenol) or ibuprofen (Advil). 3. Use ice packs to help control pain. 4. If you need a refill on your pain medication, please contact your pharmacy.  They will contact our office to request authorization. Prescriptions will not be filled after 5pm or on week-ends.  HOME MEDICATIONS 5. Take your usually prescribed medications unless otherwise directed.  DIET 6. You should follow a light diet the first few days after arrival home.  Be sure to include lots of fluids daily. Avoid fatty, fried foods.   CONSTIPATION 7. It is common to experience some constipation after surgery and if you are taking pain medication.  Increasing fluid intake and taking a stool softener (such as Colace) will usually help or prevent this problem from occurring.  A mild laxative (Milk of Magnesia or Miralax) should be taken according to package instructions if there are no bowel  movements after 48 hours.  WOUND/INCISION CARE 8. Most patients will experience some swelling and bruising in the area of the incisions.  Ice packs will help.  Swelling and bruising can take several days to resolve.  9. Unless discharge instructions indicate otherwise, follow guidelines below  a. STERI-STRIPS - you may remove your outer bandages 48 hours after surgery, and you may shower at that time.  You have steri-strips (small skin tapes) in place directly over the incision.  These strips should be left on the skin for 7-10 days.   b. DERMABOND/SKIN GLUE - you may shower in 24 hours.  The glue will flake off over the next 2-3 weeks. 10. Any sutures or staples will be removed at the office during your follow-up visit.  ACTIVITIES 11. You may resume regular (light) daily activities beginning the next day--such as daily self-care, walking, climbing stairs--gradually increasing activities as tolerated.  You may have sexual intercourse when it is comfortable.  Refrain from any heavy lifting or straining until approved by your doctor. a. You may drive when you are no longer taking prescription pain medication, you can comfortably wear a seatbelt, and you can safely maneuver your car and apply brakes.  FOLLOW-UP 12. You should see your doctor in the office for a follow-up appointment approximately 2-3 weeks after your surgery.  You should have been given your post-op/follow-up appointment when your surgery was scheduled.  If you did not receive a post-op/follow-up appointment, make sure that you call for this appointment within a day or two after you arrive home to insure a convenient appointment time.     WHEN TO CALL YOUR DOCTOR: 1. Fever over 101.0 2. Inability to urinate 3. Continued bleeding from incision. 4. Increased pain, redness, or drainage from the incision. 5. Increasing abdominal pain  The clinic staff is available to answer your questions during regular business hours.  Please don't  hesitate to call and ask to speak to one of the nurses for clinical concerns.  If you have a medical emergency, go to the nearest emergency room or call 911.  A surgeon from [facility] is always on call at the hospital. [address], [address], [place], [place]  [address] ? [address] [address], [place], [place]   [address] [phone] ? [phone] ? FAX [phone] Web site: [email or web address]  .........   Managing Your Pain After Surgery Without Opioids    Thank you for participating in our program to help patients manage their pain after surgery without opioids. This is part of our effort to provide you with the best care possible, without exposing you or your family to the risk that opioids pose.  What pain can I expect after surgery? You can expect to have some pain after surgery. This is normal. The pain is typically worse the day after surgery, and quickly begins to get better. Many studies have found that many patients are able to manage their pain after surgery with Over-the-Counter (OTC) medications such as Tylenol and Motrin. If you have a condition that does not allow you to take Tylenol or Motrin, notify your surgical team.  How will I manage my pain? The best strategy for controlling your pain after surgery is around the clock pain control with Tylenol (acetaminophen) and Motrin (ibuprofen or Advil). Alternating these medications with each other allows you to maximize your pain control. In addition to Tylenol and Motrin, you can use heating pads or ice packs on your incisions to help reduce your pain.  How will I alternate your regular strength over-the-counter pain medication? You will take a dose of pain medication every three hours. ; Start by taking 650 mg of Tylenol (2 pills of 325 mg) ; 3 hours later take 600 mg of Motrin (3 pills of 200 mg) ; 3 hours after taking the Motrin take 650 mg of Tylenol ; 3 hours after that take 600 mg of  Motrin.   - 1 -  See example - if your first dose of Tylenol is at 12:00 PM   12:00 PM Tylenol 650 mg (2 pills of 325 mg)  3:00 PM Motrin 600 mg (3 pills of 200 mg)  6:00 PM Tylenol 650 mg (2 pills of 325 mg)  9:00 PM Motrin 600 mg (3 pills of 200 mg)  Continue alternating every 3 hours   We recommend that you follow this schedule around-the-clock for at least 3 days after surgery, or until you feel that it is no longer needed. Use the table on the last page of this handout to keep track of the medications you are taking. Important: Do not take more than 3000mg of Tylenol or 3200mg of Motrin in a 24-hour period. Do not take ibuprofen/Motrin if you have a history of bleeding stomach ulcers, severe kidney disease, &/or actively taking a blood thinner  What if I still have pain? If you have pain that is not controlled with the over-the-counter pain medications (Tylenol and Motrin or Advil) you might have what we call "breakthrough" pain. You will receive a prescription for a small amount of an opioid pain medication such as   Oxycodone, Tramadol, or Tylenol with Codeine. Use these opioid pills in the first 24 hours after surgery if you have breakthrough pain. Do not take more than 1 pill every 4-6 hours.  If you still have uncontrolled pain after using all opioid pills, don't hesitate to call our staff using the number provided. We will help make sure you are managing your pain in the best way possible, and if necessary, we can provide a prescription for additional pain medication.   Day 1    Time  Name of Medication Number of pills taken  Amount of Acetaminophen  Pain Level   Comments  AM PM       AM PM       AM PM       AM PM       AM PM       AM PM       AM PM       AM PM       Total Daily amount of Acetaminophen Do not take more than  3,000 mg per day      Day 2    Time  Name of Medication Number of pills taken  Amount of Acetaminophen  Pain Level   Comments  AM  PM       AM PM       AM PM       AM PM       AM PM       AM PM       AM PM       AM PM       Total Daily amount of Acetaminophen Do not take more than  3,000 mg per day      Day 3    Time  Name of Medication Number of pills taken  Amount of Acetaminophen  Pain Level   Comments  AM PM       AM PM       AM PM       AM PM          AM PM       AM PM       AM PM       AM PM       Total Daily amount of Acetaminophen Do not take more than  3,000 mg per day      Day 4    Time  Name of Medication Number of pills taken  Amount of Acetaminophen  Pain Level   Comments  AM PM       AM PM       AM PM       AM PM       AM PM       AM PM       AM PM       AM PM       Total Daily amount of Acetaminophen Do not take more than  3,000 mg per day      Day 5    Time  Name of Medication Number of pills taken  Amount of Acetaminophen  Pain Level   Comments  AM PM       AM PM       AM PM       AM PM       AM PM       AM PM       AM PM         AM PM       Total Daily amount of Acetaminophen Do not take more than  3,000 mg per day       Day 6    Time  Name of Medication Number of pills taken  Amount of Acetaminophen  Pain Level  Comments  AM PM       AM PM       AM PM       AM PM       AM PM       AM PM       AM PM       AM PM       Total Daily amount of Acetaminophen Do not take more than  3,000 mg per day      Day 7    Time  Name of Medication Number of pills taken  Amount of Acetaminophen  Pain Level   Comments  AM PM       AM PM       AM PM       AM PM       AM PM       AM PM       AM PM       AM PM       Total Daily amount of Acetaminophen Do not take more than  3,000 mg per day        For additional information about how and where to safely dispose of unused opioid medications - https://www.morepowerfulnc.org  Disclaimer: This document contains information and/or instructional materials adapted from [facility]  for the typical patient with your condition. It does not replace medical advice from your health care provider because your experience may differ from that of the typical patient. Talk to your health care provider if you have any questions about this document, your condition or your treatment plan. Adapted from [facility]  

## 2021-03-08 NOTE — Discharge Summary (Signed)
Physician Discharge Summary  Patient ID: Gregg [name] MRN: [id number] DOB/AGE: 63/19/1957 63 y.o.  Admit date: 03/07/2021 Discharge date: 03/08/2021  Admission Diagnoses: Acute phlegmonous appendicitis  Discharge Diagnoses:  Principal Problem:   Acute phlegmonous appendicitis s/p lap appendectomy 03/07/2021 Active Problems:   Incarcerated left inguinal hernia   Right inguinal hernia   Discharged Condition: stable  Hospital Course:  Pt was admitted to the floor following laparoscopic appendectomy by Dr. Johney Maine 03/07/2021. The appendix had phlegmonous changes around it.  He did very well overnight and was passing gas and had had a BM by the next morning.  He tolerated a soft diet without n/v.  He had good pain control and was able to void.  He was ambulatory independently and desires to go home.    Consults: None  Significant Diagnostic Studies: labs: WBCs 14.7k upon admission, CT showing acute appendicitis  Treatments: surgery: see above  Discharge Exam: Blood pressure 124/77, pulse 91, temperature 98.9 F (37.2 C), temperature source Oral, resp. rate 18, height 5\' 9"  (1.753 m), weight 70 kg, SpO2 90 %. General appearance: alert, cooperative and no distress Resp: breathing comfortably GI: soft, non distended, approp tender.  dressings c/d/i.   Extremities: extremities normal, atraumatic, no cyanosis or edema  Disposition: Discharge disposition: 01-Home or Self Care       Discharge Instructions    Call MD for:  difficulty breathing, headache or visual disturbances   Complete by: As directed    Call MD for:  persistant nausea and vomiting   Complete by: As directed    Call MD for:  redness, tenderness, or signs of infection (pain, swelling, redness, odor or green/yellow discharge around incision site)   Complete by: As directed    Call MD for:  severe uncontrolled pain   Complete by: As directed    Call MD for:  temperature >100.4   Complete by: As directed    Diet - low  sodium heart healthy   Complete by: As directed    Increase activity slowly   Complete by: As directed      Allergies as of 03/08/2021   No Known Allergies     Medication List    TAKE these medications   dextromethorphan-guaiFENesin 30-600 MG 12hr tablet Commonly known as: MUCINEX DM Take 1 tablet by mouth daily as needed (for mucus).   hydrocortisone cream 1 % Apply 1 application topically daily as needed for itching.   multivitamin Tabs tablet Take 1 tablet by mouth daily.   naproxen sodium 220 MG tablet Commonly known as: ALEVE Take 440 mg by mouth as needed (pain).   oxyCODONE 5 MG immediate release tablet Commonly known as: Oxy IR/ROXICODONE Take 1 tablet (5 mg total) by mouth every 6 (six) hours as needed for severe pain.   TYLENOL COLD/FLU DAY PO Take 1 tablet by mouth 2 (two) times daily as needed (for cough or congestion).       Follow-up Information    Sur[facility], [place]. Go on 04/01/2021.   Specialty: General Surgery Why: 9:15 AM. Please arrive 30 min prior to appointment time. Bring photo ID and insurance information.  Contact information: [place] [address] [phone]               Signed: Stark Klein 03/08/2021, 11:00 AM

## 2021-03-09 ENCOUNTER — Encounter (HOSPITAL_COMMUNITY): Payer: Self-pay | Admitting: Surgery

## 2021-03-09 NOTE — Anesthesia Postprocedure Evaluation (Signed)
Anesthesia Post Note  Patient: Gregg [name]  Procedure(s) Performed: APPENDECTOMY LAPAROSCOPIC (N/A Abdomen)     Patient location during evaluation: PACU Anesthesia Type: General Level of consciousness: awake and alert Pain management: pain level controlled Vital Signs Assessment: post-procedure vital signs reviewed and stable Respiratory status: spontaneous breathing, nonlabored ventilation, respiratory function stable and patient connected to nasal cannula oxygen Cardiovascular status: blood pressure returned to baseline and stable Postop Assessment: no apparent nausea or vomiting Anesthetic complications: no   No complications documented.  Last Vitals:  Vitals:   03/08/21 0603 03/08/21 0942  BP: 114/67 124/77  Pulse: 85 91  Resp: 18 18  Temp: 37 C 37.2 C  SpO2: 92% 90%    Last Pain:  Vitals:   03/08/21 0942  TempSrc: Oral  PainSc:                  [place]

## 2021-03-10 LAB — SURGICAL PATHOLOGY

## 2021-03-25 ENCOUNTER — Ambulatory Visit: Payer: Self-pay | Admitting: Surgery

## 2021-03-25 NOTE — H&P (Signed)
Gregg [name] Appointment: 03/25/2021 11:45 AM Location: [facility] Patient #: [id number] DOB: [birth year] Single / Language: Gregg [name] / Race: White Male  History of Present Illness Gregg Hector MD; 03/25/2021 12:22 PM) The patient is a 65 year old male who presents with appendicitis. Note for "Appendicitis": ` ` ` The patient returns s/p diagnostic laparoscopy with appendectomy and reduction of incarcerated left inguinal hernia. 03/07/2021.  Pathology benign      The patient returns to clinic after surgery, gradually improving. Pain from the incisions is fading away. He finished his 5 days of oral Augmentin antibiotics. He did have significant hernias in both groins. Left one had sigmoid colon and omentum within it. I reduce that and then tried to repack with some omentum. He had some pain soreness and swelling there but it seems to be stabilizing. Patient notes he's had some groin swelling for a while. The hernias are reducible now. The right side is actually larger more sensitive. He is moving his bowels every day. Was using Metamucil but feels a little bloated with it. Felt constipated on the oxycodone. Switch to muscle relaxant and over-the-counter pain medicines. Now off all medications. He claims he is moving his bowels every day. No problems with urination. Sensitivity to his left suprapubic port site incision at the skin surface only. However nearly healed. Appetite and energy level good. He is hoping to get back to work and at the church this [place] holiday season.  Denies nausea, constipation/diarrhea, worsening fatigue, high fevers, or other concerns. He quit smoking over 10 years ago. Can walk several miles a difficulty. No sleep apnea. No diabetes. No prior hernia surgery. Aside from the appendectomy, he's not had any other abdominal surgery. No cardiac or pulmonary issues. Not on blood  thinners. ` ###########################################  Pathology:SURGICAL PATHOLOGY CASE: [id number] PATIENT: Gregg [name] [facility] Surgical Pathology Report     Clinical History: Acute appendicitis (crm)     FINAL MICROSCOPIC DIAGNOSIS:  A. APPENDIX, APPENDECTOMY: - Acute appendicitis with periappendicitis.   Mileydi Milsap DESCRIPTION:  Specimen: Vermiform appendix, received fresh. Size: 8.5 cm in length x 1.1 cm in diameter. Serosa: Pink-red, with diffuse tan-white exudate. Mucosa: Tan-red. Wall: Up to 0.3 cm in thickness. Lumen: Mildly dilated, and filled with a small amount of tan-red hemorrhagic fluid. Block Summary: 2 blocks submitted, with the resection margin inked black (KL 03/08/2021).    Final Diagnosis performed by Gillie Manners, MD. Electronically signed 03/10/2021 Technical component performed at [facility], [place] [address], [place], [place] [address]. Professional component performed at [employer]. [facility], [place] [address] [place], [place] [address]. Immunohistochemistry Technical component (if applicable) was performed at [facility]. [address] [place], [place], [place] [address]. IMMUNOHISTOCHEMISTRY DISCLAIMER (if applicable): Some of these immunohistochemical stains may have been developed and the performance characteristics determine by [facility]. Some may not have been cleared or approved by the U.S. Food and Drug Administration. The FDA has determined that such clearance or approval is not necessary. This test is used for clinical purposes. It should not be regarded as investigational or for research. This laboratory is certified under the [place] (CLIA-88) as qualified to perform high complexity clinical laboratory testing. The controls stained appropriately. ` ###########################################`   PATIENT: Gregg [name]  65 y.o. male  Patient Care Team: Patient, No Pcp Per as PCP - General (General Practice)  PRE-OPERATIVE DIAGNOSIS:  ACUTE APPENDICITIS BILATERAL INGUINAL HERNIAS  POST-OPERATIVE DIAGNOSIS:  PERFORATED  ACUTE APPENDICITIS WITH PHLEGMON INCARCERATED LEFT INGUINAL HERNIA RIGHT INGUINAL HERNIA  PROCEDURE:  APPENDECTOMY LAPAROSCOPIC REDUCTION OF INGUINAL HERNIAS  SURGEON: Gregg Hector, MD  ANESTHESIA: local and general  EBL: Total I/O In: 1692.8 [I.V.:1000; IV Piggyback:692.8] Out: 250 [Urine:200; Blood:50]  Delay start of Pharmacological VTE agent (>24hrs) due to surgical blood loss or risk of bleeding: no  DRAINS: none  SPECIMEN: APPENDIX  DISPOSITION OF SPECIMEN: PATHOLOGY  COUNTS: YES  PLAN OF CARE: Admit to inpatient  PATIENT DISPOSITION: PACU - hemodynamically stable.   INDICATIONS: Patient with concerning symptoms & work up suspicious for appendicitis. Surgery was recommended:  The anatomy & physiology of the digestive tract was discussed. The pathophysiology of appendicitis was discussed. Natural history risks without surgery was discussed. I feel the risks of no intervention will lead to serious problems that outweigh the operative risks; therefore, I recommended diagnostic laparoscopy with removal of appendix to remove the pathology. Laparoscopic & open techniques were discussed. I noted a good likelihood this will help address the problem.   Risks such as bleeding, infection, abscess, leak, reoperation, possible ostomy, hernia, heart attack, death, and other risks were discussed. Goals of post-operative recovery were discussed as well. We will work to minimize complications. Questions were answered. The patient expresses understanding & wishes to proceed with surgery.  OR FINDINGS: Patient had anterior appendix and right lower quadrant with thickened phlegmon and probable perforation. Some focal peritonitis as well. Able to do  appendectomy laparoscopically.  Patient has large bilateral indirect inguinal hernias. Left side incarcerated with sigmoid colon greater omentum. Right side reducible. I was able to eventually reduce the left-sided inguinal hernia, mobilized the colon, and repack with some omentum to avoid urgent obstruction.  CASE DATA:  Type of patient?: LDOW CASE (Surgical Hospitalist [facility] Inpatient)  Status of Case? EMERGENT Add On  Infection Present At Time Of Surgery (PATOS)? PHLEGMON  DESCRIPTION:  The patient was identified & brought into the operating room. The patient was positioned supine with arms tucked. SCDs were active during the entire case. The patient underwent general anesthesia without any difficulty. The abdomen was prepped and draped in a sterile fashion. A Surgical Timeout confirmed our plan.  I made a transverse incision through the superior umbilical fold. I made a small transverse nick through the infraumbilical fascia and confirmed peritoneal entry. Laparoscopic port cannot pass easily so I placed another 5 Miller port in the left upper quadrant using optical entry technique. Camera inspection revealed no injury. I placed additional ports under direct laparoscopic visualization.  I could see phlegmon in the right lower quadrant consistent with perforated appendicitis with phlegmon. Patient had large inguinal hernias. Left side had sigmoid colon and omentum going up into it. I was eventually able to reduce it with abdominal wall and laparoscopic pressure. I mobilized the sigmoid colon in a lateral to medial fashion so that it would fall away from that. Right inguinal hernia was reducible.  I focused on appendectomy. I mobilized the terminal ileum to proximal ascending colon in a lateral to medial fashion. I took care to avoid injuring any retroperitoneal structures. I freed the appendix off its attachments to the ascending colon and cecal mesentery. I elevated the  appendix. I skeletonized the mesoappendix. I was able to free off the base of the appendix which was still viable. I stapled the appendix off the cecum using a laparoscopic stapler. I took a healthy cuff of viable cecum. I ligated the mesoappendix and assured hemostasis in the mesentery. I  placed the appendix inside an EcoSac bag and removed out the 21mm stapler port.  I did copious irrigation. Hemostasis was good in the mesoappendix, colon mesentery, and retroperitoneum. Staple line was intact on the cecum with no bleeding. I washed out the pelvis, retrohepatic space and right paracolic gutter. I washed out the left side as well. Hemostasis is good. There was no perforation or injury. Because the area cleaned up well after irrigation, I did not place a drain. I closed the 12 mm stapler port site with a #1 PDS stitch in the LLQ using a suture passer under direct laparoscopic visualization. I mobilized greater omentum and placed it in the left inguinal region so that it would prevent colon and small bowel to become obstructed into the hernia. I aspirated the carbon dioxide. I removed the ports. I closed skin using 4-0 monocryl stitch. Sterile dressings applied.  Patient was extubated and sent to the recovery room. I discussed the operative findings with the patient's friend, Jeani Hawking I suspect the patient is going used in the hospital at least overnight and will need antibiotics for 5 days. While he did have large inguinal hernias they were ultimately reducible and I did not feel it was wise to try and repair them at the time of a perforated appendectomy. I do think he would benefit from considering repair in a few months once he is gotten over this since 1 was already incarcerated with colon within it. We can discuss on postop visit Questions answered. Jeani Hawking expressed understanding and appreciation.  Gregg [name], M.D., F.A.C.S. Gastrointestinal and Minimally Invasive Surgery [facility] [address] [place],  [address] [phone] Main / Paging  03/07/2021 6:18 PM    Physical Exam Gregg Hector MD; 03/25/2021 12:16 PM)  General Mental Status-Alert. General Appearance-Not in acute distress. Voice-Normal. Note: Relaxed. Nontoxic.  Integumentary Global Assessment Upon inspection and palpation of skin surfaces of the - Distribution of scalp and body hair is normal. General Characteristics Overall examination of the patient's skin reveals - no rashes and no suspicious lesions.  Head and Neck Head-normocephalic, atraumatic with no lesions or palpable masses. Face Global Assessment - atraumatic, no absence of expression. Neck Global Assessment - no abnormal movements, no decreased range of motion. Trachea-midline. Thyroid Gland Characteristics - non-tender.  Eye Eyeball - Left-Extraocular movements intact, No Nystagmus - Left. Eyeball - Right-Extraocular movements intact, No Nystagmus - Right. Upper Eyelid - Left-No Cyanotic - Left. Upper Eyelid - Right-No Cyanotic - Right.  Chest and Lung Exam Inspection Accessory muscles - No use of accessory muscles in breathing.  Abdomen Note: Incisions with normal healing ridges. No active bleeding. No cellulitis. No guarding/rebound tenderness  Male Genitourinary Note: Bilateral inguinal hernias, right actually larger this time. Sensitive but reducible.. Left greater than right. otherwise NEMG  Peripheral Vascular Upper Extremity Inspection - Left - Not Gangrenous, No Petechiae. Inspection - Right - Not Gangrenous, No Petechiae.  Neurologic Neurologic evaluation reveals -normal attention span and ability to concentrate, able to name objects and repeat phrases. Appropriate fund of knowledge and normal coordination.  Neuropsychiatric Mental status exam performed with findings of-able to articulate well with normal speech/language, rate, volume  and coherence and no evidence of hallucinations, delusions, obsessions or homicidal/suicidal ideation. Orientation-oriented X3.  Musculoskeletal Global Assessment Gait and Station - normal gait and station.  Lymphatic General Lymphatics Description - No Generalized lymphadenopathy.    Assessment & Plan Gregg Hector MD; 03/25/2021 12:21 PM)  BILATERAL INGUINAL HERNIA WITHOUT OBSTRUCTION OR GANGRENE, RECURRENCE NOT SPECIFIED (K40.20) Impression: Bilateral inguinal hernias of moderate size. Left groin was incarcerated with colon and omentum. Both sides reducible right-sided larger and more symptomatic now.  He will benefit from hernia repair. He is very interested in proceeding. I guess his brother had issues with mesh after left open inguinal repair. Tried to reassure the patient about being safe and avoiding emergent hernia surgery, waiting more than 6 weeks from his appendectomy to minimize risk of infection, use of ABx dose periop, yet need for mesh to minimize hernia recurrence (especially in light of them being bilateral and of moderate size). He feels more comfortable with recommendations. He wishes to wait after the Easter holiday season since he is very active in his church. Would need to wait until May, >6 weeks after appendectomy surgery, anyway.   PREOP - ING HERNIA - ENCOUNTER FOR PREOPERATIVE EXAMINATION FOR GENERAL SURGICAL PROCEDURE (Z01.818)  Current Plans You are being scheduled for surgery- Our schedulers will call you.  You should hear from our office's scheduling department within 5 working days about the location, date, and time of surgery. We try to make accommodations for patient's preferences in scheduling surgery, but sometimes the OR schedule or the surgeon's schedule prevents [place] from making those accommodations.  If you have not heard from our office [phone]) in 5 working days, call the office and ask for your surgeon's nurse.  If you have other  questions about your diagnosis, plan, or surgery, call the office and ask for your surgeon's nurse.  Written instructions provided The anatomy & physiology of the abdominal wall and pelvic floor was discussed. The pathophysiology of hernias in the inguinal and pelvic region was discussed. Natural history risks such as progressive enlargement, pain, incarceration, and strangulation was discussed. Contributors to complications such as smoking, obesity, diabetes, prior surgery, etc were discussed.  I feel the risks of no intervention will lead to serious problems that outweigh the operative risks; therefore, I recommended surgery to reduce and repair the hernia. I explained laparoscopic techniques with possible need for an open approach. I noted usual use of mesh to patch and/or buttress hernia repair  Risks such as bleeding, infection, abscess, need for further treatment, heart attack, death, and other risks were discussed. I noted a good likelihood this will help address the problem. Goals of post-operative recovery were discussed as well. Possibility that this will not correct all symptoms was explained. I stressed the importance of low-impact activity, aggressive pain control, avoiding constipation, & not pushing through pain to minimize risk of post-operative chronic pain or injury. Possibility of reherniation was discussed. We will work to minimize complications.  An educational handout further explaining the pathology & treatment options was given as well. Questions were answered. The patient expresses understanding & wishes to proceed with surgery.  Pt Education - Pamphlet Given - Laparoscopic Hernia Repair: discussed with patient and provided information. Pt Education - [facility] Hernia Post-Op HCI (Kareem Aul): discussed with patient and provided information. Pt Education - [facility] Mesh education: discussed with patient and provided information.  ACUTE PHLEGMONOUS APPENDICITIS  (K35.890) Impression: Recovering well status post urgent appendectomy for acute appendicitis with phlegmon. Complete antibiotics and feeling well.  High-fiber diet with fiber supplement. Seems to get bloated with Metamucil, so try different fiber supplements such as Benefiber or MiraLAX  Current Plans Return to clinic as needed.  Soreness, decreased appetite, and poor energy level are common problems after surgery. While many people can struggle  with a bad day, these concerns should gradually fade away or at least improve. Much of your recovery depends on your health & the severity of your operation. Please call if you have any further questions / concerns related to surgery.  Increase activity as tolerated to regular everyday activity. Consider daily low impact exercise every day such as walking an hour a day.  Do not push through pain. If it hurts to do it, then don't do it.  Diet as tolerated. Low fat high fiber diet ideal. 30 g fiber a day ideal. Consider taking a daily fiber supplement to keep your bowels regular.  Followup with your primary care physician for other health issues as would normally be done.  Consider screening for malignancies (breast, prostate, colon, melanoma, etc) as appropriate. Discuss with you primary care physician.  Pt Education - Education: Pathology Report given to patient Pt Education - [facility] Good Bowel Health (Lucelia Lacey)  Gregg Hector, MD, FACS, MASCRS  Gastrointestinal and Minimally Invasive Surgery  [facility] [address], [place], [place] [address] [phone] Fax [phone] Main/Paging  CONTACT INFORMATION: Weekday (9AM-5PM) concerns: Call [facility] main office at [phone] Weeknight (5PM-9AM) or Weekend/Holiday concerns: Check [email or web address] for General Surgery [facility] coverage (Please, do not use SecureChat as it is not reliable communication to operating surgeons for immediate patient care)

## 2021-04-23 ENCOUNTER — Ambulatory Visit: Payer: BC Managed Care – PPO | Admitting: Family

## 2021-05-30 ENCOUNTER — Encounter (HOSPITAL_BASED_OUTPATIENT_CLINIC_OR_DEPARTMENT_OTHER): Payer: Self-pay | Admitting: Surgery

## 2021-06-02 ENCOUNTER — Encounter (HOSPITAL_BASED_OUTPATIENT_CLINIC_OR_DEPARTMENT_OTHER): Payer: Self-pay | Admitting: Surgery

## 2021-06-02 ENCOUNTER — Other Ambulatory Visit: Payer: Self-pay

## 2021-06-02 DIAGNOSIS — Z973 Presence of spectacles and contact lenses: Secondary | ICD-10-CM

## 2021-06-02 DIAGNOSIS — R0989 Other specified symptoms and signs involving the circulatory and respiratory systems: Secondary | ICD-10-CM

## 2021-06-02 DIAGNOSIS — K4020 Bilateral inguinal hernia, without obstruction or gangrene, not specified as recurrent: Secondary | ICD-10-CM

## 2021-06-02 HISTORY — DX: Presence of spectacles and contact lenses: Z97.3

## 2021-06-02 HISTORY — DX: Other specified symptoms and signs involving the circulatory and respiratory systems: R09.89

## 2021-06-02 HISTORY — DX: Bilateral inguinal hernia, without obstruction or gangrene, not specified as recurrent: K40.20

## 2021-06-02 NOTE — Progress Notes (Signed)
Spoke w/ via phone for pre-op interview---pt Lab needs dos----    none           Lab results------none COVID test -----06-03-2021 at 1205 ( sinus symptoms x 1 week) NPO after MN NO Solid Food.   Drink ensure presurgery drink at 600 am.Clear liquids from MN until---600 am then npo Med rec completed Medications to take morning of surgery -----none Diabetic medication -----n/a Patient instructed no nail polish to be worn day of surgery n/a Patient instructed to bring photo id and insurance card day of surgery Patient aware to have Driver (ride ) / caregiver   lynn scoggins friend will stay cell [phone] for 24 hours after surgery  Patient Special Instructions -----none Pre-Op special Istructions -----none Patient verbalized understanding of instructions that were given at this phone interview. Patient denies shortness of breath, chest pain, fever, cough at this phone interview.

## 2021-06-03 ENCOUNTER — Other Ambulatory Visit (HOSPITAL_COMMUNITY)
Admission: RE | Admit: 2021-06-03 | Discharge: 2021-06-03 | Disposition: A | Discharge status: Home / Self Care | Payer: BC Managed Care – PPO | Source: Ambulatory Visit | Attending: Surgery | Admitting: Surgery

## 2021-06-03 DIAGNOSIS — D176 Benign lipomatous neoplasm of spermatic cord: Secondary | ICD-10-CM | POA: Diagnosis not present

## 2021-06-03 DIAGNOSIS — K4020 Bilateral inguinal hernia, without obstruction or gangrene, not specified as recurrent: Secondary | ICD-10-CM | POA: Diagnosis not present

## 2021-06-03 DIAGNOSIS — Z87891 Personal history of nicotine dependence: Secondary | ICD-10-CM | POA: Diagnosis not present

## 2021-06-03 DIAGNOSIS — Z01812 Encounter for preprocedural laboratory examination: Secondary | ICD-10-CM | POA: Insufficient documentation

## 2021-06-03 DIAGNOSIS — Z20822 Contact with and (suspected) exposure to covid-19: Secondary | ICD-10-CM | POA: Diagnosis not present

## 2021-06-03 LAB — SARS CORONAVIRUS 2 (TAT 6-24 HRS): SARS Coronavirus 2: NEGATIVE

## 2021-06-05 NOTE — Progress Notes (Signed)
Confirmed with patient arrival time of 0700.

## 2021-06-06 ENCOUNTER — Other Ambulatory Visit: Payer: Self-pay

## 2021-06-06 ENCOUNTER — Ambulatory Visit (HOSPITAL_BASED_OUTPATIENT_CLINIC_OR_DEPARTMENT_OTHER): Payer: BC Managed Care – PPO | Admitting: Anesthesiology

## 2021-06-06 ENCOUNTER — Encounter (HOSPITAL_BASED_OUTPATIENT_CLINIC_OR_DEPARTMENT_OTHER): Payer: Self-pay | Admitting: Surgery

## 2021-06-06 ENCOUNTER — Ambulatory Visit (HOSPITAL_BASED_OUTPATIENT_CLINIC_OR_DEPARTMENT_OTHER)
Admission: RE | Admit: 2021-06-06 | Discharge: 2021-06-06 | Disposition: A | Discharge status: Home / Self Care | Payer: BC Managed Care – PPO | Attending: Surgery | Admitting: Surgery

## 2021-06-06 ENCOUNTER — Encounter (HOSPITAL_BASED_OUTPATIENT_CLINIC_OR_DEPARTMENT_OTHER)
Admission: RE | Disposition: A | Discharge status: Home / Self Care | Payer: Self-pay | Source: Home / Self Care | Attending: Surgery

## 2021-06-06 DIAGNOSIS — K4020 Bilateral inguinal hernia, without obstruction or gangrene, not specified as recurrent: Secondary | ICD-10-CM | POA: Diagnosis not present

## 2021-06-06 DIAGNOSIS — Z87891 Personal history of nicotine dependence: Secondary | ICD-10-CM | POA: Insufficient documentation

## 2021-06-06 DIAGNOSIS — Z20822 Contact with and (suspected) exposure to covid-19: Secondary | ICD-10-CM | POA: Insufficient documentation

## 2021-06-06 DIAGNOSIS — D176 Benign lipomatous neoplasm of spermatic cord: Secondary | ICD-10-CM | POA: Insufficient documentation

## 2021-06-06 HISTORY — PX: INGUINAL HERNIA REPAIR: SHX194

## 2021-06-06 HISTORY — DX: Gastro-esophageal reflux disease without esophagitis: K21.9

## 2021-06-06 HISTORY — DX: Anxiety disorder, unspecified: F41.9

## 2021-06-06 SURGERY — REPAIR, HERNIA, INGUINAL, BILATERAL, LAPAROSCOPIC
Anesthesia: General | Site: Abdomen | Laterality: Bilateral

## 2021-06-06 MED ORDER — PROPOFOL 10 MG/ML IV BOLUS
INTRAVENOUS | Status: DC | PRN
  Administered 2021-06-06: 180 mg via INTRAVENOUS

## 2021-06-06 MED ORDER — FENTANYL CITRATE (PF) 100 MCG/2ML IJ SOLN
INTRAMUSCULAR | Status: DC | PRN
  Administered 2021-06-06: 100 ug via INTRAVENOUS

## 2021-06-06 MED ORDER — TRAMADOL HCL 50 MG PO TABS
ORAL_TABLET | ORAL | Status: AC
  Filled 2021-06-06: qty 1

## 2021-06-06 MED ORDER — LACTATED RINGERS IV SOLN: INTRAVENOUS | Status: DC

## 2021-06-06 MED ORDER — ROCURONIUM BROMIDE 10 MG/ML (PF) SYRINGE
PREFILLED_SYRINGE | INTRAVENOUS | Status: AC
  Filled 2021-06-06: qty 10

## 2021-06-06 MED ORDER — DEXAMETHASONE SODIUM PHOSPHATE 10 MG/ML IJ SOLN
INTRAMUSCULAR | Status: AC
  Filled 2021-06-06: qty 1

## 2021-06-06 MED ORDER — BUPIVACAINE LIPOSOME 1.3 % IJ SUSP
INTRAMUSCULAR | Status: DC | PRN
  Administered 2021-06-06: 20 mL

## 2021-06-06 MED ORDER — DEXAMETHASONE SODIUM PHOSPHATE 10 MG/ML IJ SOLN
INTRAMUSCULAR | Status: DC | PRN
  Administered 2021-06-06: 8 mg via INTRAVENOUS

## 2021-06-06 MED ORDER — ROCURONIUM BROMIDE 10 MG/ML (PF) SYRINGE
PREFILLED_SYRINGE | INTRAVENOUS | Status: DC | PRN
  Administered 2021-06-06: 20 mg via INTRAVENOUS
  Administered 2021-06-06: 60 mg via INTRAVENOUS

## 2021-06-06 MED ORDER — ONDANSETRON HCL 4 MG/2ML IJ SOLN
INTRAMUSCULAR | Status: AC
  Filled 2021-06-06: qty 2

## 2021-06-06 MED ORDER — CELECOXIB 200 MG PO CAPS
200.0000 mg | ORAL_CAPSULE | ORAL | Status: AC
  Administered 2021-06-06: 200 mg via ORAL

## 2021-06-06 MED ORDER — BUPIVACAINE LIPOSOME 1.3 % IJ SUSP: 20.0000 mL | Freq: Once | INTRAMUSCULAR | Status: DC

## 2021-06-06 MED ORDER — TRAMADOL HCL 50 MG PO TABS: 50.0000 mg | ORAL_TABLET | Freq: Four times a day (QID) | ORAL | 0 refills | Status: DC | PRN

## 2021-06-06 MED ORDER — EPHEDRINE SULFATE 50 MG/ML IJ SOLN
INTRAMUSCULAR | Status: DC | PRN
  Administered 2021-06-06: 10 mg via INTRAVENOUS
  Administered 2021-06-06: 15 mg via INTRAVENOUS

## 2021-06-06 MED ORDER — ONDANSETRON HCL 4 MG/2ML IJ SOLN
INTRAMUSCULAR | Status: DC | PRN
  Administered 2021-06-06: 4 mg via INTRAVENOUS

## 2021-06-06 MED ORDER — GABAPENTIN 300 MG PO CAPS
300.0000 mg | ORAL_CAPSULE | ORAL | Status: AC
  Administered 2021-06-06: 300 mg via ORAL

## 2021-06-06 MED ORDER — ENSURE PRE-SURGERY PO LIQD: 296.0000 mL | Freq: Once | ORAL | Status: DC

## 2021-06-06 MED ORDER — PHENYLEPHRINE 40 MCG/ML (10ML) SYRINGE FOR IV PUSH (FOR BLOOD PRESSURE SUPPORT)
PREFILLED_SYRINGE | INTRAVENOUS | Status: DC | PRN
  Administered 2021-06-06: 80 ug via INTRAVENOUS
  Administered 2021-06-06: 120 ug via INTRAVENOUS
  Administered 2021-06-06: 80 ug via INTRAVENOUS
  Administered 2021-06-06: 120 ug via INTRAVENOUS

## 2021-06-06 MED ORDER — CEFAZOLIN SODIUM-DEXTROSE 2-4 GM/100ML-% IV SOLN
2.0000 g | INTRAVENOUS | Status: AC
  Administered 2021-06-06: 2 g via INTRAVENOUS

## 2021-06-06 MED ORDER — PHENYLEPHRINE 40 MCG/ML (10ML) SYRINGE FOR IV PUSH (FOR BLOOD PRESSURE SUPPORT)
PREFILLED_SYRINGE | INTRAVENOUS | Status: AC
  Filled 2021-06-06: qty 10

## 2021-06-06 MED ORDER — FENTANYL CITRATE (PF) 100 MCG/2ML IJ SOLN
INTRAMUSCULAR | Status: AC
  Filled 2021-06-06: qty 2

## 2021-06-06 MED ORDER — PROPOFOL 10 MG/ML IV BOLUS
INTRAVENOUS | Status: AC
  Filled 2021-06-06: qty 20

## 2021-06-06 MED ORDER — MIDAZOLAM HCL 5 MG/5ML IJ SOLN
INTRAMUSCULAR | Status: DC | PRN
  Administered 2021-06-06: 2 mg via INTRAVENOUS

## 2021-06-06 MED ORDER — BUPIVACAINE-EPINEPHRINE 0.25% -1:200000 IJ SOLN
INTRAMUSCULAR | Status: DC | PRN
  Administered 2021-06-06: 50 mL

## 2021-06-06 MED ORDER — LIDOCAINE 2% (20 MG/ML) 5 ML SYRINGE
INTRAMUSCULAR | Status: DC | PRN
  Administered 2021-06-06: 50 mg via INTRAVENOUS

## 2021-06-06 MED ORDER — EPHEDRINE 5 MG/ML INJ
INTRAVENOUS | Status: AC
  Filled 2021-06-06: qty 10

## 2021-06-06 MED ORDER — GABAPENTIN 300 MG PO CAPS
ORAL_CAPSULE | ORAL | Status: AC
  Filled 2021-06-06: qty 1

## 2021-06-06 MED ORDER — CHLORHEXIDINE GLUCONATE CLOTH 2 % EX PADS: 6.0000 | MEDICATED_PAD | Freq: Once | CUTANEOUS | Status: DC

## 2021-06-06 MED ORDER — PROMETHAZINE HCL 25 MG/ML IJ SOLN
6.2500 mg | INTRAMUSCULAR | Status: DC | PRN
Start: 2021-06-06 — End: 2021-06-06

## 2021-06-06 MED ORDER — MIDAZOLAM HCL 2 MG/2ML IJ SOLN
INTRAMUSCULAR | Status: AC
  Filled 2021-06-06: qty 2

## 2021-06-06 MED ORDER — ACETAMINOPHEN 500 MG PO TABS
ORAL_TABLET | ORAL | Status: AC
  Filled 2021-06-06: qty 2

## 2021-06-06 MED ORDER — TRAMADOL HCL 50 MG PO TABS
50.0000 mg | ORAL_TABLET | Freq: Once | ORAL | Status: AC
  Administered 2021-06-06: 50 mg via ORAL

## 2021-06-06 MED ORDER — ACETAMINOPHEN 500 MG PO TABS
1000.0000 mg | ORAL_TABLET | ORAL | Status: AC
  Administered 2021-06-06: 1000 mg via ORAL

## 2021-06-06 MED ORDER — STERILE WATER FOR IRRIGATION IR SOLN
Status: DC | PRN
  Administered 2021-06-06: 200 mL

## 2021-06-06 MED ORDER — FENTANYL CITRATE (PF) 100 MCG/2ML IJ SOLN
25.0000 ug | INTRAMUSCULAR | Status: DC | PRN
  Administered 2021-06-06 (×2): 25 ug via INTRAVENOUS

## 2021-06-06 MED ORDER — CEFAZOLIN SODIUM-DEXTROSE 2-4 GM/100ML-% IV SOLN
INTRAVENOUS | Status: AC
  Filled 2021-06-06: qty 100

## 2021-06-06 MED ORDER — SUGAMMADEX SODIUM 200 MG/2ML IV SOLN
INTRAVENOUS | Status: DC | PRN
  Administered 2021-06-06: 150 mg via INTRAVENOUS

## 2021-06-06 MED ORDER — CELECOXIB 200 MG PO CAPS
ORAL_CAPSULE | ORAL | Status: AC
  Filled 2021-06-06: qty 1

## 2021-06-06 SURGICAL SUPPLY — 47 items
APL PRP STRL LF DISP 70% ISPRP (MISCELLANEOUS) ×1
CABLE HIGH FREQUENCY MONO STRZ (ELECTRODE) IMPLANT
CHLORAPREP W/TINT 26 (MISCELLANEOUS) ×2 IMPLANT
COVER WAND RF STERILE (DRAPES) ×2 IMPLANT
DECANTER SPIKE VIAL GLASS SM (MISCELLANEOUS) IMPLANT
DEVICE SECURE STRAP 25 ABSORB (INSTRUMENTS) IMPLANT
DRAPE WARM FLUID 44X44 (DRAPES) ×2 IMPLANT
DRSG TEGADERM 2-3/8X2-3/4 SM (GAUZE/BANDAGES/DRESSINGS) ×2 IMPLANT
DRSG TEGADERM 4X4.75 (GAUZE/BANDAGES/DRESSINGS) ×2 IMPLANT
ELECT REM PT RETURN 9FT ADLT (ELECTROSURGICAL) ×2
ELECTRODE REM PT RTRN 9FT ADLT (ELECTROSURGICAL) ×1 IMPLANT
GLOVE SRG 8 PF TXTR STRL LF DI (GLOVE) ×1 IMPLANT
GLOVE SURG ENC MOIS LTX SZ6.5 (GLOVE) ×4 IMPLANT
GLOVE SURG ENC MOIS LTX SZ7 (GLOVE) ×2 IMPLANT
GLOVE SURG LTX SZ8 (GLOVE) ×2 IMPLANT
GLOVE SURG UNDER POLY LF SZ7 (GLOVE) ×10 IMPLANT
GLOVE SURG UNDER POLY LF SZ8 (GLOVE) ×2
GOWN STRL REUS W/TWL LRG LVL3 (GOWN DISPOSABLE) ×6 IMPLANT
GOWN STRL REUS W/TWL XL LVL3 (GOWN DISPOSABLE) ×2 IMPLANT
IRRIG SUCT STRYKERFLOW 2 WTIP (MISCELLANEOUS)
IRRIGATION SUCT STRKRFLW 2 WTP (MISCELLANEOUS) IMPLANT
IV NS 1000ML (IV SOLUTION)
IV NS 1000ML BAXH (IV SOLUTION) IMPLANT
KIT TURNOVER CYSTO (KITS) ×2 IMPLANT
MANIFOLD NEPTUNE II (INSTRUMENTS) ×2 IMPLANT
MESH HERNIA 6X6 BARD (Mesh General) ×3 IMPLANT
MESH HERNIA BARD 6X6 (Mesh General) ×3 IMPLANT
NEEDLE HYPO 22GX1.5 SAFETY (NEEDLE) IMPLANT
NS IRRIG 500ML POUR BTL (IV SOLUTION) ×2 IMPLANT
PACK BASIN DAY SURGERY FS (CUSTOM PROCEDURE TRAY) ×2 IMPLANT
PAD POSITIONING PINK XL (MISCELLANEOUS) ×2 IMPLANT
SCISSORS LAP 5X35 DISP (ENDOMECHANICALS) ×2 IMPLANT
SET TUBE SMOKE EVAC HIGH FLOW (TUBING) ×2 IMPLANT
SLEEVE ADV FIXATION 5X100MM (TROCAR) ×2 IMPLANT
SPONGE GAUZE 2X2 8PLY STRL LF (GAUZE/BANDAGES/DRESSINGS) ×2 IMPLANT
SUT MNCRL AB 4-0 PS2 18 (SUTURE) ×2 IMPLANT
SUT PDS AB 1 CT1 27 (SUTURE) IMPLANT
SUT VIC AB 2-0 SH 27 (SUTURE) ×6
SUT VIC AB 2-0 SH 27XBRD (SUTURE) ×3 IMPLANT
SUT VICRYL 0 UR6 27IN ABS (SUTURE) IMPLANT
TOWEL OR 17X26 10 PK STRL BLUE (TOWEL DISPOSABLE) ×2 IMPLANT
TRAY DSU PREP LF (CUSTOM PROCEDURE TRAY) ×2 IMPLANT
TRAY LAPAROSCOPIC (CUSTOM PROCEDURE TRAY) ×2 IMPLANT
TROCAR ADV FIXATION 5X100MM (TROCAR) ×2 IMPLANT
TROCAR XCEL BLUNT TIP 100MML (ENDOMECHANICALS) ×2 IMPLANT
WATER STERILE IRR 1000ML POUR (IV SOLUTION) ×2 IMPLANT
WATER STERILE IRR 500ML POUR (IV SOLUTION) IMPLANT

## 2021-06-06 NOTE — Interval H&P Note (Signed)
History and Physical Interval Note:  06/06/2021 9:16 AM  Gregg [name]  has presented today for surgery, with the diagnosis of [place].  The various methods of treatment have been discussed with the patient and family. After consideration of risks, benefits and other options for treatment, the patient has consented to  Procedure(s): LAPAROSCOPIC BILATERAL INGUINAL HERNIA REPAIRS (Bilateral) as a surgical intervention.  The patient's history has been reviewed, patient examined, no change in status, stable for surgery.  I have reviewed the patient's chart and labs.  Questions were answered to the patient's satisfaction.    I have re-reviewed the the patient's records, history, medications, and allergies.  I have re-examined the patient.  I again discussed intraoperative plans and goals of post-operative recovery.  The patient agrees to proceed.  Zayaan Kozak  1955-12-23 [id number]  Patient Care Team: Patient, No Pcp Per (Inactive) as PCP - General (General Practice) Michael Boston, MD as Consulting Physician (General Surgery)  Patient Active Problem List   Diagnosis Date Noted   Acute phlegmonous appendicitis s/p lap appendectomy 03/07/2021 03/07/2021   Kidney stone 03/07/2021   Incarcerated left inguinal hernia 03/07/2021   Right inguinal hernia 03/07/2021    Past Medical History:  Diagnosis Date   Anxiety    Bilateral inguinal hernia 06/02/2021   GERD (gastroesophageal reflux disease)    History of kidney stones 2020   Sinus complaint 06/02/2021   x 1 week per pt   Wears glasses 06/02/2021    Past Surgical History:  Procedure Laterality Date   EXTRACORPOREAL SHOCK WAVE LITHOTRIPSY Left 02/23/2019   Procedure: EXTRACORPOREAL SHOCK WAVE LITHOTRIPSY (ESWL);  Surgeon: Bjorn Loser, MD;  Location: [facility];  Service: Urology;  Laterality: Left;   LAPAROSCOPIC APPENDECTOMY N/A 03/07/2021   Procedure: APPENDECTOMY LAPAROSCOPIC;  Surgeon: Michael Boston,  MD;  Location: [facility];  Service: General;  Laterality: N/A;    Social History   Socioeconomic History   Marital status: Single    Spouse name: Not on file   Number of children: Not on file   Years of education: Not on file   Highest education level: Not on file  Occupational History   Not on file  Tobacco Use   Smoking status: Former    Packs/day: 0.50    Years: 40.00    Pack years: 20.00    Types: Cigarettes    Quit date: 03/29/2017    Years since quitting: 4.1   Smokeless tobacco: Never  Vaping Use   Vaping Use: Never used  Substance and Sexual Activity   Alcohol use: Yes    Comment: rare   Drug use: Never   Sexual activity: Not on file  Other Topics Concern   Not on file  Social History Narrative   Not on file   Social Determinants of Health   Financial Resource Strain: Not on file  Food Insecurity: Not on file  Transportation Needs: Not on file  Physical Activity: Not on file  Stress: Not on file  Social Connections: Not on file  Intimate Partner Violence: Not on file    History reviewed. No pertinent family history.  Medications Prior to Admission  Medication Sig Dispense Refill Last Dose   acetaminophen (TYLENOL) 500 MG tablet Take 1,000 mg by mouth every 6 (six) hours as needed.   Past Week   dextromethorphan-guaiFENesin (MUCINEX DM) 30-600 MG 12hr tablet Take 1 tablet by mouth daily as needed (for mucus).   06/05/2021   multivitamin (ONE-A-DAY MEN'S) TABS tablet Take  1 tablet by mouth daily.   06/05/2021   psyllium (METAMUCIL) 58.6 % powder Take 1 packet by mouth every other day.   06/05/2021    Current Facility-Administered Medications  Medication Dose Route Frequency Provider Last Rate Last Admin   bupivacaine liposome (EXPAREL) 1.3 % injection 266 mg  20 mL Infiltration Once Michael Boston, MD       ceFAZolin (ANCEF) IVPB 2g/100 mL premix  2 g Intravenous On Call to OR Michael Boston, MD       Chlorhexidine Gluconate Cloth 2 % PADS 6 each  6 each Topical  Once Michael Boston, MD       And   Chlorhexidine Gluconate Cloth 2 % PADS 6 each  6 each Topical Once Michael Boston, MD       Derrill Memo ON 06/07/2021] feeding supplement (ENSURE PRE-SURGERY) liquid 296 mL  296 mL Oral Once Michael Boston, MD       lactated ringers infusion   Intravenous Continuous Belinda Block, MD 50 mL/hr at 06/06/21 0725 New Bag at 06/06/21 0725     No Known Allergies  BP (!) 159/81   Pulse (!) 112   Temp 98 F (36.7 C) (Oral)   Resp (!) 98   Ht 5\' 9"  (1.753 m)   Wt 71.8 kg   SpO2 98%   BMI 23.38 kg/m   Labs: No results found for this or any previous visit (from the past 48 hour(s)).  Imaging / Studies: No results found.   Adin Hector, M.D., F.A.C.S. Gastrointestinal and Minimally Invasive Surgery [facility] [address], [place], [place] [address] [phone] Main / Paging  06/06/2021 9:17 AM     Adin Hector

## 2021-06-06 NOTE — Anesthesia Procedure Notes (Signed)
Procedure Name: Intubation Date/Time: 06/06/2021 10:03 AM Performed by: Bonney Aid, CRNA Pre-anesthesia Checklist: Patient identified, Emergency Drugs available, Suction available and Patient being monitored Patient Re-evaluated:Patient Re-evaluated prior to induction Oxygen Delivery Method: Circle system utilized Preoxygenation: Pre-oxygenation with 100% oxygen Induction Type: IV induction Ventilation: Mask ventilation without difficulty Laryngoscope Size: Mac and 4 Grade View: Grade II Tube type: Oral Tube size: 7.5 mm Number of attempts: 1 Airway Equipment and Method: Stylet Placement Confirmation: ETT inserted through vocal cords under direct vision, positive ETCO2 and breath sounds checked- equal and bilateral Secured at: 23 cm Tube secured with: Tape Dental Injury: Teeth and Oropharynx as per pre-operative assessment

## 2021-06-06 NOTE — Transfer of Care (Signed)
Immediate Anesthesia Transfer of Care Note  Patient: Gregg [name]  Procedure(s) Performed: LAPAROSCOPIC BILATERAL INGUINAL HERNIA REPAIRS WITH MESH (Bilateral: Abdomen)  Patient Location: PACU  Anesthesia Type:General  Level of Consciousness: sedated  Airway & Oxygen Therapy: Patient Spontanous Breathing and Patient connected to nasal cannula oxygen  Post-op Assessment: Report given to RN  Post vital signs: Reviewed and stable  Last Vitals:  Vitals Value Taken Time  BP 129/65 06/06/21 1238  Temp    Pulse 84   Resp 12 06/06/21 1240  SpO2 100%   Vitals shown include unvalidated device data.  Last Pain:  Vitals:   06/06/21 0731  TempSrc: Oral  PainSc: 0-No pain      Patients Stated Pain Goal: 9 (29/57/47 3403)  Complications: No notable events documented.

## 2021-06-06 NOTE — H&P (Signed)
Gregg [name] DOB: 1956-07-13  Patient Care Team: Patient, No Pcp Per (Inactive) as PCP - General (General Practice) Gregg Boston, MD as Consulting Physician (General Surgery)    The patient returns s/p diagnostic laparoscopy with appendectomy and reduction of incarcerated left inguinal hernia.  03/07/2021.   Pathology benign           The patient returns to clinic after surgery, gradually improving.  Pain from the incisions is fading away.  He finished his 5 days of oral Augmentin antibiotics.  He did have significant hernias in both groins.  Left one had sigmoid colon and omentum within it.  I reduce that and then tried to repack with some omentum.  He had some pain soreness and swelling there but it seems to be stabilizing.  Patient notes he's had some groin swelling for a while.  The hernias are reducible now.  The right side is actually larger more sensitive.  He is moving his bowels every day.  Was using Metamucil but feels a little bloated with it.  Felt constipated on the oxycodone.  Switch to muscle relaxant and over-the-counter pain medicines.  Now off all medications.  He claims he is moving his bowels every day.  No problems with urination.  Sensitivity to his left suprapubic port site incision at the skin surface only.  However nearly healed.  Appetite and energy level good.  He is hoping to get back to work and at the church this [place] holiday season.   Denies nausea, constipation/diarrhea, worsening fatigue, high fevers, or other concerns.   He quit smoking over 10 years ago.  Can walk several miles a difficulty.  No sleep apnea.  No diabetes.  No prior hernia surgery.  Aside from the appendectomy, he's not had any other abdominal surgery.  No cardiac or pulmonary issues.  Not on blood thinners.  Ready for surgery ` ###########################################   Pathology:SURGICAL PATHOLOGY CASE: [id number] PATIENT: [name] Surgical Pathology Report          Clinical History: Acute appendicitis (crm)         FINAL MICROSCOPIC DIAGNOSIS:   A. APPENDIX, APPENDECTOMY: - Acute appendicitis with periappendicitis.     Gregg [name] DESCRIPTION:   Specimen: Vermiform appendix, received fresh. Size: 8.5 cm in length x 1.1 cm in diameter. Serosa: Pink-red, with diffuse tan-white exudate. Mucosa: Tan-red. Wall: Up to 0.3 cm in thickness. Lumen: Mildly dilated, and filled with a small amount of tan-red hemorrhagic fluid. Block Summary: 2 blocks submitted, with the resection margin inked black (KL 03/08/2021).       Final Diagnosis performed by Gregg Manners, MD.   Electronically signed 03/10/2021 Technical component performed at [facility], [place] [address], [place], [place] [address]. Professional component performed at [employer]. [facility], [place] [address], [place], [place] [address]. Immunohistochemistry Technical component (if applicable) was performed at [facility]. [address], [place], [place], [place] [address].   IMMUNOHISTOCHEMISTRY DISCLAIMER (if applicable): Some of these immunohistochemical stains may have been developed and the performance characteristics determine by [facility]. Some may not have been cleared or approved by the U.S. Food and Drug Administration. The FDA has determined that such clearance or approval is not necessary. This test is used for clinical purposes. It should not be regarded as investigational or for research. This laboratory is certified under the [place] (CLIA-88) as qualified to perform high complexity clinical laboratory testing.  The controls stained appropriately. ` ###########################################`  PATIENT:  Gregg [name]  65 y.o. male   Patient Care Team: Patient, No Pcp Per as PCP - General (General Practice)   PRE-OPERATIVE DIAGNOSIS:   ACUTE APPENDICITIS BILATERAL  INGUINAL HERNIAS   POST-OPERATIVE DIAGNOSIS:   PERFORATED ACUTE APPENDICITIS WITH PHLEGMON INCARCERATED LEFT INGUINAL HERNIA RIGHT INGUINAL HERNIA   PROCEDURE:   APPENDECTOMY LAPAROSCOPIC REDUCTION OF INGUINAL HERNIAS   SURGEON:  Gregg Hector, MD   ANESTHESIA:   local and general   EBL:  Total I/O In: 1692.8 [I.V.:1000; IV Piggyback:692.8] Out: 250 [Urine:200; Blood:50]   Delay start of Pharmacological VTE agent (>24hrs) due to surgical blood loss or risk of bleeding:  no   DRAINS: none   SPECIMEN:  APPENDIX   DISPOSITION OF SPECIMEN:  PATHOLOGY   COUNTS:  YES   PLAN OF CARE: Admit to inpatient   PATIENT DISPOSITION:  PACU - hemodynamically stable.     INDICATIONS: Patient with concerning symptoms & work up suspicious for appendicitis.  Surgery was recommended:   The anatomy & physiology of the digestive tract was discussed.  The pathophysiology of appendicitis was discussed.  Natural history risks without surgery was discussed.   I feel the risks of no intervention will lead to serious problems that outweigh the operative risks; therefore, I recommended diagnostic laparoscopy with removal of appendix to remove the pathology.  Laparoscopic & open techniques were discussed.   I noted a good likelihood this will help address the problem.     Risks such as bleeding, infection, abscess, leak, reoperation, possible ostomy, hernia, heart attack, death, and other risks were discussed.  Goals of post-operative recovery were discussed as well.  We will work to minimize complications.  Questions were answered.  The patient expresses understanding & wishes to proceed with surgery.   OR FINDINGS: Patient had anterior appendix and right lower quadrant with thickened phlegmon and probable perforation.  Some focal peritonitis as well.  Able to do appendectomy laparoscopically.   Patient has large bilateral indirect inguinal hernias.  Left side incarcerated with sigmoid colon greater  omentum.  Right side reducible.  I was able to eventually reduce the left-sided inguinal hernia, mobilized the colon, and repack with some omentum to avoid urgent obstruction.   CASE DATA:   Type of patient?: LDOW CASE (Surgical Hospitalist [facility] Inpatient)   Status of Case? EMERGENT Add On   Infection Present At Time Of Surgery (PATOS)?  PHLEGMON   DESCRIPTION:   The patient was identified & brought into the operating room. The patient was positioned supine with arms tucked. SCDs were active during the entire case. The patient underwent general anesthesia without any difficulty.  The abdomen was prepped and draped in a sterile fashion. A Surgical Timeout confirmed our plan.   I made a transverse incision through the superior umbilical fold.  I made a small transverse nick through the infraumbilical fascia and confirmed peritoneal entry.  Laparoscopic port cannot pass easily so I placed another 5 Miller port in the left upper quadrant using optical entry technique.  Camera inspection revealed no injury.  I placed additional ports under direct laparoscopic visualization.   I could see phlegmon in the right lower quadrant consistent with perforated appendicitis with phlegmon.  Patient had large inguinal hernias.  Left side had sigmoid colon and omentum going up into it.  I was eventually able to reduce it with abdominal wall and laparoscopic pressure.  I mobilized the sigmoid colon in a lateral to medial fashion so that it would fall  away from that.  Right inguinal hernia was reducible.   I focused on appendectomy.  I mobilized the terminal ileum to proximal ascending colon in a lateral to medial fashion.  I took care to avoid injuring any retroperitoneal structures.  I freed the appendix off its attachments to the ascending colon and cecal mesentery.  I elevated the appendix. I skeletonized the mesoappendix. I was able to free off the base of the appendix which was still viable.  I stapled the appendix  off the cecum using a laparoscopic stapler. I took a healthy cuff of viable cecum. I ligated the mesoappendix and assured hemostasis in the mesentery.  I placed the appendix inside an EcoSac bag and removed out the 85mm stapler port.   I did copious irrigation. Hemostasis was good in the mesoappendix, colon mesentery, and retroperitoneum. Staple line was intact on the cecum with no bleeding. I washed out the pelvis, retrohepatic space and right paracolic gutter. I washed out the left side as well.  Hemostasis is good. There was no perforation or injury. Because the area cleaned up well after irrigation, I did not place a drain.  I closed the 12 mm stapler port site with a #1 PDS stitch in the LLQ using a suture passer under direct laparoscopic visualization.  I mobilized greater omentum and placed it in the left inguinal region so that it would prevent colon and small bowel to become obstructed into the hernia.  I aspirated the carbon dioxide. I removed the ports.  I closed skin using 4-0 monocryl stitch.  Sterile dressings applied.   Patient was extubated and sent to the recovery room.  I discussed the operative findings with the patient's friend, Jeani Hawking  I suspect the patient is going used in the hospital at least overnight and will need antibiotics for 5 days.  While he did have large inguinal hernias they were ultimately reducible and I did not feel it was wise to try and repair them at the time of a perforated appendectomy.  I do think he would benefit from considering repair in a few months once he is gotten over this since 1 was already incarcerated with colon within it.  We can discuss on postop visit   Questions answered. Jeani Hawking expressed understanding and appreciation.   Gregg [name], M.D., F.A.C.S. Gastrointestinal and Minimally Invasive Surgery [facility] [address] [place], [place] [address] [phone] Main / Paging   03/07/2021 6:18 PM    BP (!)  159/81   Pulse (!) 112   Temp 98 F (36.7 C) (Oral)   Resp (!) 98   Ht 5\' 9"  (1.753 m)   Wt 71.8 kg   SpO2 98%   BMI 23.38 kg/m  06/06/2021    Physical Exam Gregg Hector MD; 03/25/2021 12:16 PM)   General Mental Status - Alert. General Appearance - Not in acute distress. Voice - Normal. Note:  Relaxed.  Nontoxic.   Integumentary Global Assessment Upon inspection and palpation of skin surfaces of the - Distribution of scalp and body hair is normal. General Characteristics Overall examination of the patient's skin reveals - no rashes and no suspicious lesions.   Head and Neck Head - normocephalic, atraumatic with no lesions or palpable masses. Face Global Assessment - atraumatic, no absence of expression. Neck Global Assessment - no abnormal movements, no decreased range of motion. Trachea - midline. Thyroid Gland Characteristics - non-tender.   Eye Eyeball - Left - Extraocular movements  intact, No Nystagmus - Left. Eyeball - Right - Extraocular movements intact, No Nystagmus - Right. Upper Eyelid - Left - No Cyanotic - Left. Upper Eyelid - Right - No Cyanotic - Right.   Chest and Lung Exam Inspection Accessory muscles - No use of accessory muscles in breathing.   Abdomen Note:  Incisions with normal healing ridges.  No active bleeding.  No cellulitis.  No guarding/rebound tenderness   Male Genitourinary Note:  Bilateral inguinal hernias, right actually larger this time. Sensitive but reducible.. Left greater than right. otherwise NEMG   Peripheral Vascular Upper Extremity Inspection - Left - Not Gangrenous, No Petechiae. Inspection - Right - Not Gangrenous, No Petechiae.   Neurologic Neurologic evaluation reveals  - normal attention span and ability to concentrate, able to name objects and repeat phrases. Appropriate fund of knowledge and normal coordination.   Neuropsychiatric Mental status exam performed with findings of - able to articulate well with  normal speech/language, rate, volume and coherence and no evidence of hallucinations, delusions, obsessions or homicidal/suicidal ideation. Orientation - oriented X3.   Musculoskeletal Global Assessment Gait and Station - normal gait and station.   Lymphatic General Lymphatics Description - No Generalized lymphadenopathy.       Assessment & Plan    BILATERAL INGUINAL HERNIA WITHOUT OBSTRUCTION OR GANGRENE, RECURRENCE NOT SPECIFIED (K40.20) Impression: Bilateral inguinal hernias of moderate size. Left groin was incarcerated with colon and omentum. Both sides reducible right-sided larger and more symptomatic now.   He will benefit from hernia repair. He is very interested in proceeding. I guess his brother had issues with mesh after left open inguinal repair. Tried to reassure the patient about being safe and avoiding emergent hernia surgery, waiting more than 6 weeks from his appendectomy to minimize risk of infection, use of ABx dose periop, yet need for mesh to minimize hernia recurrence (especially in light of them being bilateral and of moderate size). He feels more comfortable with recommendations. He wishes to wait after the Easter holiday season since he is very active in his church. Would need to wait until May, >6 weeks after appendectomy surgery, anyway.     The anatomy & physiology of the abdominal wall and pelvic floor was discussed.  The pathophysiology of hernias in the inguinal and pelvic region was discussed.  Natural history risks such as progressive enlargement, pain, incarceration, and strangulation was discussed.   Contributors to complications such as smoking, obesity, diabetes, prior surgery, etc were discussed.    I feel the risks of no intervention will lead to serious problems that outweigh the operative risks; therefore, I recommended surgery to reduce and repair the hernia.  I explained laparoscopic techniques with possible need for an open approach.  I noted usual  use of mesh to patch and/or buttress hernia repair   Risks such as bleeding, infection, abscess, need for further treatment, heart attack, death, and other risks were discussed.  I noted a good likelihood this will help address the problem.   Goals of post-operative recovery were discussed as well.  Possibility that this will not correct all symptoms was explained.  I stressed the importance of low-impact activity, aggressive pain control, avoiding constipation, & not pushing through pain to minimize risk of post-operative chronic pain or injury. Possibility of reherniation was discussed.  We will work to minimize complications.     An educational handout further explaining the pathology & treatment options was given as well.  Questions were answered.  The patient expresses understanding &  wishes to proceed with surgery.   Pt Education - Pamphlet Given - Laparoscopic Hernia Repair: discussed with patient and provided information. Pt Education - [facility] Hernia Post-Op HCI (Genie Wenke): discussed with patient and provided information. Pt Education - [facility] Mesh education: discussed with patient and provided information.   Gregg Hector, MD, FACS, MASCRS Esophageal, Gastrointestinal & Colorectal Surgery Robotic and Minimally Invasive Surgery  [facility] [address], [place], [place] [address] [phone] Fax [phone] Main/Paging  CONTACT INFORMATION: Weekday (9AM-5PM) concerns: Call [facility] main office at [phone] Weeknight (5PM-9AM) or Weekend/Holiday concerns: Check [email or web address] for General Surgery [facility] coverage (Please, do not use SecureChat as it is not reliable communication to operating surgeons for immediate patient care)

## 2021-06-06 NOTE — Anesthesia Preprocedure Evaluation (Addendum)
Anesthesia Evaluation  Patient identified by MRN, date of birth, ID band Patient awake    Reviewed: Allergy & Precautions, NPO status , Patient's Chart, lab work & pertinent test results  Airway Mallampati: II  TM Distance: >3 FB Neck ROM: Full    Dental  (+) Teeth Intact   Pulmonary neg pulmonary ROS, former smoker,    Pulmonary exam normal        Cardiovascular negative cardio ROS   Rhythm:Regular Rate:Normal     Neuro/Psych Anxiety negative neurological ROS     GI/Hepatic Neg liver ROS, GERD  Controlled,Bilateral inguinal hernias    Endo/Other    Renal/GU negative Renal ROS  negative genitourinary   Musculoskeletal negative musculoskeletal ROS (+)   Abdominal (+)  Abdomen: soft. Bowel sounds: normal.  Peds  Hematology negative hematology ROS (+)   Anesthesia Other Findings   Reproductive/Obstetrics                             Anesthesia Physical Anesthesia Plan  ASA: 2  Anesthesia Plan: General   Post-op Pain Management:    Induction: Intravenous  PONV Risk Score and Plan: 2 and Ondansetron, Dexamethasone, Midazolam and Treatment may vary due to age or medical condition  Airway Management Planned: Mask and Oral ETT  Additional Equipment: None  Intra-op Plan:   Post-operative Plan: Extubation in OR  Informed Consent: I have reviewed the patients History and Physical, chart, labs and discussed the procedure including the risks, benefits and alternatives for the proposed anesthesia with the patient or authorized representative who has indicated his/her understanding and acceptance.     Dental advisory given  Plan Discussed with: CRNA  Anesthesia Plan Comments: (Lab Results      Component                Value               Date                      WBC                      14.7 (H)            03/07/2021                HGB                      17.5 (H)             03/07/2021                HCT                      52.1 (H)            03/07/2021                MCV                      93.5                03/07/2021                PLT                      272  03/07/2021           Lab Results      Component                Value               Date                      NA                       139                 03/07/2021                K                        3.4 (L)             03/07/2021                CO2                      27                  03/07/2021                GLUCOSE                  123 (H)             03/07/2021                BUN                      18                  03/07/2021                CREATININE               1.08                03/07/2021                CALCIUM                  9.8                 03/07/2021                GFRNONAA                 >60                 03/07/2021                GFRAA                    >60                 02/19/2019          )        Anesthesia Quick Evaluation

## 2021-06-06 NOTE — Anesthesia Postprocedure Evaluation (Signed)
Anesthesia Post Note  Patient: Gregg [name]  Procedure(s) Performed: LAPAROSCOPIC BILATERAL INGUINAL HERNIA REPAIRS WITH MESH (Bilateral: Abdomen)     Patient location during evaluation: PACU Anesthesia Type: General Level of consciousness: awake and alert Pain management: pain level controlled Vital Signs Assessment: post-procedure vital signs reviewed and stable Respiratory status: spontaneous breathing, nonlabored ventilation, respiratory function stable and patient connected to nasal cannula oxygen Cardiovascular status: blood pressure returned to baseline and stable Postop Assessment: no apparent nausea or vomiting Anesthetic complications: no   No notable events documented.  Last Vitals:  Vitals:   06/06/21 1330 06/06/21 1345  BP: 128/77 128/63  Pulse: 99 100  Resp: 13 12  Temp:    SpO2: 93% 92%    Last Pain:  Vitals:   06/06/21 1330  TempSrc:   PainSc: 10-Worst pain ever                 Belenda Cruise P Legion Discher

## 2021-06-06 NOTE — Op Note (Signed)
06/06/2021  12:36 PM  PATIENT:  Gregg [name]  65 y.o. male  Patient Care Team: Patient, No Pcp Per (Inactive) as PCP - General (General Practice) Michael Boston, MD as Consulting Physician (General Surgery)  PRE-OPERATIVE DIAGNOSIS:  BILATERAL INGUINAL INCARCERATED HERNIAS  POST-OPERATIVE DIAGNOSIS:  BILATERAL INGUINAL HERNIAS  PROCEDURE:  Procedure(s): LAPAROSCOPIC BILATERAL INGUINAL HERNIA REPAIRS WITH MESH  SURGEON:  Adin Hector, MD  ASSISTANT: None  ANESTHESIA:     Regional ilioinguinal and genitofemoral and spermatic cord nerve blocks  General  Regional TRANSVERSUS ABDOMINIS PLANE (TAP) nerve block for perioperative & postoperative pain control provided with liposomal bupivacaine (Experel) mixed with 0.25% bupivacaine as a Bilateral TAP block x 57mL each side at the level of the transverse abdominis & preperitoneal spaces along the flank at the anterior axillary line, from subcostal ridge to iliac crest under laparoscopic guidance    EBL:  Total I/O In: 700 [I.V.:600; IV Piggyback:100] Out: 25 [Blood:25].  See anesthesia record  Delay start of Pharmacological VTE agent (>24hrs) due to surgical blood loss or risk of bleeding:  no  DRAINS: NONE  SPECIMEN:  NONE  DISPOSITION OF SPECIMEN:  N/A  COUNTS:  YES  PLAN OF CARE: Discharge to home after PACU  PATIENT DISPOSITION:  PACU - hemodynamically stable.  INDICATION: Pleasant active gentleman with bilateral inguinal hernias.  I recommended laparoscopic exploration repair of hernias found  The anatomy & physiology of the abdominal wall and pelvic floor was discussed.  The pathophysiology of hernias in the inguinal and pelvic region was discussed.  Natural history risks such as progressive enlargement, pain, incarceration & strangulation was discussed.   Contributors to complications such as smoking, obesity, diabetes, prior surgery, etc were discussed.    I feel the risks of no intervention will lead to serious  problems that outweigh the operative risks; therefore, I recommended surgery to reduce and repair the hernia.  I explained laparoscopic techniques with possible need for an open approach.  I noted usual use of mesh to patch and/or buttress hernia repair  Risks such as bleeding, infection, abscess, need for further treatment, heart attack, death, and other risks were discussed.  I noted a good likelihood this will help address the problem.   Goals of post-operative recovery were discussed as well.  Possibility that this will not correct all symptoms was explained.  I stressed the importance of low-impact activity, aggressive pain control, avoiding constipation, & not pushing through pain to minimize risk of post-operative chronic pain or injury. Possibility of reherniation was discussed.  We will work to minimize complications.     An educational handout further explaining the pathology & treatment options was given as well.  Questions were answered.  The patient expresses understanding & wishes to proceed with surgery.  OR FINDINGS: Large pantaloon type bilateral inguinal hernias direct space greater than indirect on both sides.  No femoral or obturator hernias.  DESCRIPTION:  The patient was identified & brought into the operating room. The patient was positioned supine with arms tucked. SCDs were active during the entire case. The patient underwent general anesthesia without any difficulty.  The abdomen was prepped and draped in a sterile fashion. The patient's bladder was emptied.  A Surgical Timeout confirmed our plan.  I made a transverse incision through the inferior umbilical fold.  I made a small transverse nick through the anterior rectus fascia contralateral to the inguinal hernia side and placed a 0-vicryl stitch through the fascia.  I placed a Hasson trocar into  the preperitoneal plane.  Entry was clean.  We induced carbon dioxide insufflation. Camera inspection revealed no injury.  I used a  78mm angled scope to bluntly free the peritoneum off the infraumbilical anterior abdominal wall.  I created enough of a preperitoneal pocket to place 28mm ports into the right & left mid-abdomen into this preperitoneal cavity.  I focused attention on the RIGHT pelvis since that was the dominant hernia side.   I used blunt & focused sharp dissection to free the peritoneum off the flank and down to the pubic rim.  I freed the anteriolateral bladder wall off the anteriolateral pelvic wall, sparing midline attachments.   I located a swath of peritoneum going into a hernia fascial defect at the  direct space consistent with   pantaloon type direct greater than indirect inguinal hernias ..  I gradually freed the peritoneal hernia sac off safely and reduced it into the preperitoneal space.  I freed the peritoneum off the spermatic vessels & vas deferens.  I freed peritoneum off the retroperitoneum along the psoas muscle.  He had some adhesions from his prior appendectomy.  Peritoneal repair done with 2-0 Vicryl using laparoscopic intracorporeal suturing spermatic cord lipoma was dissected away & removed.  I checked & assured hemostasis.     I turned attention on the opposite  LEFT pelvis.  I did dissection in a similar, mirror-image fashion. The patient had a similar pantaloon type direct greater than indirect double inguinal hernia.  I had to open up the peritoneum for anatomy in primarily repaired the peritoneal defect using 2-0 Vicryl using laparoscopic intracorporeal suturing...   Spermatic cord lipoma was dissected away & removed.    I checked & assured hemostasis.     Given the numerous moderate-sized hernias, I chose 15x15 cm sheets of medium weight Bard Marlex mesh one for each side.  I cut a single sigmoid-shaped slit ~6cm from a corner of each mesh.  I placed the meshes into the preperitoneal space & laid them as overlapping diamonds such that at the inferior points, a 6x6 cm corner flap rested in the true  anterolateral pelvis, covering the obturator & femoral foramina.   I allowed the bladder to return to the pubis, this helping tuck the corners of the mesh in the anteriolateral pelvis.  The medial corners overlapped each other across midline cephalad to the pubic rim.   Given the numerous hernias of moderate size, I placed a third 15x15cm mesh in the center as a vertical diamond.  The lateral wings of the mesh overlap across the direct spaces and internal rings where the dominant hernias were.  This provided good coverage and reinforcement of the hernia repairs.  Because of the central mesh placement with good overlap, I did not place any tacks.   I held the hernia sacs cephalad & evacuated carbon dioxide.  I closed the fascia with absorbable suture.  I closed the skin using 4-0 monocryl stitch.  Sterile dressings were applied.   The patient was extubated & arrived in the PACU in stable condition..  I had discussed postoperative care with the patient in the holding area.  Instructions are written in the chart.  I discussed operative findings, updated the patient's status, discussed probable steps to recovery, and gave postoperative recommendations to the  patient's friend, Collene Gobble .  Recommendations were made.  Questions were answered.  They expressed understanding & appreciation.   Adin Hector, M.D., F.A.C.S. Gastrointestinal and Minimally Invasive Surgery [facility] [address] [place], [place] [address] [phone] Main / Paging  06/06/2021 12:36 PM

## 2021-06-06 NOTE — Discharge Instructions (Addendum)
HERNIA REPAIR: POST OP INSTRUCTIONS  ######################################################################  EAT Gradually transition to a high fiber diet with a fiber supplement over the next few weeks after discharge.  Start with a pureed / full liquid diet (see below)  WALK Walk an hour a day.  Control your pain to do that.    CONTROL PAIN Control pain so that you can walk, sleep, tolerate sneezing/coughing, and go up/down stairs.  HAVE A BOWEL MOVEMENT DAILY Keep your bowels regular to avoid problems.  OK to try a laxative to override constipation.  OK to use an antidairrheal to slow down diarrhea.  Call if not better after 2 tries  CALL IF YOU HAVE PROBLEMS/CONCERNS Call if you are still struggling despite following these instructions. Call if you have concerns not answered by these instructions  ######################################################################    DIET: Follow a light bland diet & liquids the first 24 hours after arrival home, such as soup, liquids, starches, etc.  Be sure to drink plenty of fluids.  Quickly advance to a usual solid diet within a few days.  Avoid fast food or heavy meals as your are more likely to get nauseated or have irregular bowels.  A low-fat, high-fiber diet for the rest of your life is ideal.   Take your usually prescribed home medications unless otherwise directed.  PAIN CONTROL: Pain is best controlled by a usual combination of three different methods TOGETHER: Ice/Heat Over the counter pain medication Prescription pain medication Most patients will experience some swelling and bruising around the hernia(s) such as the bellybutton, groins, or old incisions.  Ice packs or heating pads (30-60 minutes up to 6 times a day) will help. Use ice for the first few days to help decrease swelling and bruising, then switch to heat to help relax tight/sore spots and speed recovery.  Some people prefer to use ice alone, heat alone, alternating  between ice & heat.  Experiment to what works for you.  Swelling and bruising can take several weeks to resolve.   It is helpful to take an over-the-counter pain medication regularly for the first few weeks.  Choose one of the following that works best for you: Naproxen (Aleve, etc)  Two 244m tabs twice a day Ibuprofen (Advil, etc) Three 2045mtabs four times a day (every meal & bedtime) Acetaminophen (Tylenol, etc) 325-65043mour times a day (every meal & bedtime) A  prescription for pain medication should be given to you upon discharge.  Take your pain medication as prescribed.  If you are having problems/concerns with the prescription medicine (does not control pain, nausea, vomiting, rash, itching, etc), please call us [phone] see if we need to switch you to a different pain medicine that will work better for you and/or control your side effect better. If you need a refill on your pain medication, please contact your pharmacy.  They will contact our office to request authorization. Prescriptions will not be filled after 5 pm or on week-ends.  Avoid getting constipated.  Between the surgery and the pain medications, it is common to experience some constipation.  Increasing fluid intake and taking a fiber supplement (such as Metamucil, Citrucel, FiberCon, MiraLax, etc) 1-2 times a day regularly will usually help prevent this problem from occurring.  A mild laxative (prune juice, Milk of Magnesia, MiraLax, etc) should be taken according to package directions if there are no bowel movements after 48 hours.    Wash / shower every day.  You may shower over the dressings  as they are waterproof.    Remove your waterproof bandages, skin tapes, and other bandages 3 days after surgery. You may replace a dressing/Band-Aid to cover the incision for comfort if you wish. You may leave the incisions open to air.  You may replace a dressing/Band-Aid to cover an incision for comfort if you wish.  Continue  to shower over incision(s) after the dressing is off.  ACTIVITIES as tolerated:   You may resume regular (light) daily activities beginning the next day--such as daily self-care, walking, climbing stairs--gradually increasing activities as tolerated.  Control your pain so that you can walk an hour a day.  If you can walk 30 minutes without difficulty, it is safe to try more intense activity such as jogging, treadmill, bicycling, low-impact aerobics, swimming, etc. Save the most intensive and strenuous activity for last such as sit-ups, heavy lifting, contact sports, etc  Refrain from any heavy lifting or straining until you are off narcotics for pain control.   DO NOT PUSH THROUGH PAIN.  Let pain be your guide: If it hurts to do something, don't do it.  Pain is your body warning you to avoid that activity for another week until the pain goes down. You may drive when you are no longer taking prescription pain medication, you can comfortably wear a seatbelt, and you can safely maneuver your car and apply brakes. You may have sexual intercourse when it is comfortable.   FOLLOW UP in our office Please call [facility] at [phone] to set up an appointment to see your surgeon in the office for a follow-up appointment approximately 2-3 weeks after your surgery. Make sure that you call for this appointment the day you arrive home to insure a convenient appointment time.  9.  If you have disability of FMLA / Family leave forms, please bring the forms to the office for processing.  (do not give to your surgeon).  WHEN TO CALL us [phone]: Poor pain control Reactions / problems with new medications (rash/itching, nausea, etc)  Fever over 101.5 F (38.5 C) Inability to urinate Nausea and/or vomiting Worsening swelling or bruising Continued bleeding from incision. Increased pain, redness, or drainage from the incision   The clinic staff is available to answer your questions during regular business  hours (8:30am-5pm).  Please don't hesitate to call and ask to speak to one of our nurses for clinical concerns.   If you have a medical emergency, go to the nearest emergency room or call 911.  A surgeon from [facility] is always on call at the hospitals in [facility], [place], [place], [place], [place]  [address] ?  [address] [address], [place], [place]   [address] MAIN: [phone] ? TOLL FREE: [phone] ? FAX: [phone] [email or web address]  Post Anesthesia Home Care Instructions  Activity: Get plenty of rest for the remainder of the day. A responsible individual must stay with you for 24 hours following the procedure.  For the next 24 hours, DO NOT: -Drive a car -Paediatric nurse -Drink alcoholic beverages -Take any medication unless instructed by your physician -Make any legal decisions or sign important papers.  Meals: Start with liquid foods such as gelatin or soup. Progress to regular foods as tolerated. Avoid greasy, spicy, heavy foods. If nausea and/or vomiting occur, drink only clear liquids until the nausea and/or vomiting subsides. Call your physician if vomiting continues.  Special Instructions/Symptoms: Your throat may feel dry or sore from the  anesthesia or the breathing tube placed in your throat during surgery. If this causes discomfort, gargle with warm salt water. The discomfort should disappear within 24 hours.  If you had a scopolamine patch placed behind your ear for the management of post- operative nausea and/or vomiting:  1. The medication in the patch is effective for 72 hours, after which it should be removed.  Wrap patch in a tissue and discard in the trash. Wash hands thoroughly with soap and water. 2. You may remove the patch earlier than 72 hours if you experience unpleasant side effects which may include dry mouth, dizziness or visual disturbances. 3. Avoid touching the patch. Wash  your hands with soap and water after contact with the patch.    Information for Discharge Teaching: EXPAREL (bupivacaine liposome injectable suspension)   Your surgeon or anesthesiologist gave you EXPAREL(bupivacaine) to help control your pain after surgery.  EXPAREL is a local anesthetic that provides pain relief by numbing the tissue around the surgical site. EXPAREL is designed to release pain medication over time and can control pain for up to 72 hours. Depending on how you respond to EXPAREL, you may require less pain medication during your recovery.  Possible side effects: Temporary loss of sensation or ability to move in the area where bupivacaine was injected. Nausea, vomiting, constipation Rarely, numbness and tingling in your mouth or lips, lightheadedness, or anxiety may occur. Call your doctor right away if you think you may be experiencing any of these sensations, or if you have other questions regarding possible side effects.  Follow all other discharge instructions given to you by your surgeon or nurse. Eat a healthy diet and drink plenty of water or other fluids.  If you return to the hospital for any reason within 96 hours following the administration of EXPAREL, it is important for health care providers to know that you have received this anesthetic. A teal colored band has been placed on your arm with the date, time and amount of EXPAREL you have received in order to alert and inform your health care providers. Please leave this armband in place for the full 96 hours following administration, and then you may remove the band.

## 2021-06-09 ENCOUNTER — Encounter (HOSPITAL_BASED_OUTPATIENT_CLINIC_OR_DEPARTMENT_OTHER): Payer: Self-pay | Admitting: Surgery

## 2021-07-01 DIAGNOSIS — Z8719 Personal history of other diseases of the digestive system: Secondary | ICD-10-CM | POA: Insufficient documentation

## 2021-07-01 NOTE — Progress Notes (Addendum)
Subjective:    Gregg [name] - 65 y.o. male MRN [id number]  Date of birth: 06-20-1956  HPI  Gregg [name] is to establish care. Patient has a PMH significant for acute phlegmonous appendicitis s/p lap appendectomy 03/07/2021, kidney stone, and bilateral inguinal hernia s/p lap repair with mesh 06/06/2021.   Current issues and/or concerns: ANXIETY DEPRESSION: Reports mostly related to the passing away of his sister whom he cared for in her home for 33 years. She passed away in 09-07-2021. Reports she had epilepsy, anxiety, and was deaf. Reports she had atrial fibrillation which may have been related to her death. Currently his brother has atrial fibrillation and seems to be anxiety related. His sister lived right beside of his home, they were neighbors. Sometimes looks over to her home and can envision her sitting on the porch from their memories.  Patient has 3 living siblings living in various areas from [place] to [place].   Lives alone in the home with his dog as a companion. He is a Therapist, nutritional by career. Coping mechanisms include playing the piano at home and playing the organ at church. Does have a spiritual faith.  Reports he feels anxious as if his mind will not shutdown. Having panic attacks.   Reports recent surgery of appendix and hernia repair. Placed under anesthesia during those times. Considered if anesthesia caused anxiety. Also, reports that anxiety seemed to be present prior to surgery and gradually progressing and maybe he is noticing it more now. Denies thoughts of self-harm, suicidal ideations, and homicidal ideations. He declines referrals to Social Work and Psychiatry, states will let provider know if he decides to do so in the future.   2. ELEVATED BLOOD PRESSURE: Reports home blood pressures are 120's/80's. Had recent visit at another doctor's office and blood pressure was normal at that time as well.  Depression screen [facility] 2/9 07/02/2021  Decreased Interest 0  Down,  Depressed, Hopeless 0  PHQ - 2 Score 0  Altered sleeping 0  Tired, decreased energy 0  Change in appetite 0  Feeling bad or failure about yourself  0  Trouble concentrating 0  Moving slowly or fidgety/restless 0  Suicidal thoughts 0  PHQ-9 Score 0  Difficult doing work/chores Not difficult at all     ROS per HPI    Health Maintenance:  Health Maintenance Due  Topic Date Due   Hepatitis C Screening  Never done   COLONOSCOPY (Pts 45-16yrs Insurance coverage will need to be confirmed)  Never done   Zoster Vaccines- Shingrix (1 of 2) Never done     Past Medical History: Patient Active Problem List   Diagnosis Date Noted   Anxiety and depression 07/02/2021   History of appendicitis 07/01/2021   Acute phlegmonous appendicitis s/p lap appendectomy 03/07/2021 03/07/2021   Kidney stone 03/07/2021   Bilateral inguinal hernia (BIH) s/p lap repair with mesh 06/06/2021 03/07/2021    Social History   reports that he quit smoking about 4 years ago. His smoking use included cigarettes. He has a 20.00 pack-year smoking history. He has never used smokeless tobacco. He reports current alcohol use. He reports that he does not use drugs.   Family History  family history is not on file.   Medications: reviewed and updated   Objective:   Physical Exam BP (!) 157/95 (BP Location: Left Arm, Patient Position: Sitting, Cuff Size: Normal)   Pulse (!) 113   Temp 98.1 F (36.7 C)   Resp 18   Ht 5'  9.76" (1.772 m)   Wt 158 lb 6.4 oz (71.8 kg)   SpO2 98%   BMI 22.88 kg/m  Physical Exam HENT:     Head: Normocephalic and atraumatic.  Eyes:     Extraocular Movements: Extraocular movements intact.     Conjunctiva/sclera: Conjunctivae normal.     Pupils: Pupils are equal, round, and reactive to light.  Cardiovascular:     Rate and Rhythm: Regular rhythm. Tachycardia present.     Pulses: Normal pulses.     Heart sounds: Normal heart sounds.  Pulmonary:     Effort: Pulmonary effort is  normal.     Breath sounds: Normal breath sounds.  Musculoskeletal:     Cervical back: Normal range of motion and neck supple.  Neurological:     General: No focal deficit present.     Mental Status: He is alert and oriented to person, place, and time.  Psychiatric:        Mood and Affect: Mood is anxious.        Speech: Speech normal.      Assessment & Plan:  1. Encounter to establish care: - Patient presents today to establish care.  - Return for annual physical examination, labs, and health maintenance. Arrive fasting meaning having no food for at least 8 hours prior to appointment. You may have only water or black coffee. Please take scheduled medications as normal.  2. Anxiety and depression: - Patient denies thoughts of self-harm, suicidal ideations, and homicidal ideations.  - Begin Sertraline as prescribed.  Do not drink alcohol or use illicit substances with with this medication.  Avoid driving or hazardous activity until you know how this medication will affect you. Your reactions could be impaired. Dizziness or fainting can cause falls, accidents, or severe injuries. Common side effects include drowsiness, nausea, constipation, loss of appetite, dry mouth, increased sweating. Call your provider if you have pounding heartbeats or fluttering in your chest, a light-headed feeling like you may pass out, easy bruising/unusal bleeding, vision change, difficult or painful urination, impotence/sexual problems, liver problems (right-sided upper stomach pain, itching, dark urine, yellowing of skin or eyes/jaundice, low levels of sodium in the body (headache, confusion, slurred speech, severe weakness, vomiting, loss of coordination, feeling unsteady), or manic episodes (racing thoughts, increased energy, decreased need for sleep, risk-taking behavior, being agitated, talkative) Seek medical attention immediately if you have symptoms of serotonin syndrome such as agitation, hallucinations,  fever, sweating, shivering, fast heart rate, muscle stiffness, twitching, loss of coordination, nausea, vomiting, or diarrhea Report any new or worsening symptoms to your provider, such as but not limited to: mood or behavior changes, anxiety, panic attacks, trouble sleeping, or if you feel impulsive, irritable, agitated, hostile, aggressive, restless, hyperactive (mentally or physically), more depressed, or have thoughts about suicide or hurting yourself - Patient declined referral to Social Work and/or Psychiatry. - Follow-up with primary provider in 4 weeks or sooner if needed.  - sertraline (ZOLOFT) 25 MG tablet; Take 1 tablet (25 mg total) by mouth daily.  Dispense: 30 tablet; Refill: 0  3. Elevated blood pressure reading without diagnosis of hypertension: - Blood pressure elevated during today's visit. Patient asymptomatic without chest pressure, chest pain, shortness of breath, and worst headache of life.  -Follow-up in 2 weeks with primary provider for blood pressure check. Write down your blood pressure readings each day and bring those results along with your home blood pressure monitor to your appointment.     Patient was given clear instructions to  go to Emergency Department or return to medical center if symptoms don't improve, worsen, or new problems develop.The patient verbalized understanding.  I discussed the assessment and treatment plan with the patient. The patient was provided an opportunity to ask questions and all were answered. The patient agreed with the plan and demonstrated an understanding of the instructions.   The patient was advised to call back or seek an in-person evaluation if the symptoms worsen or if the condition fails to improve as anticipated.    Durene Fruits, NP 07/02/2021, 4:15 PM Primary Care at [facility]

## 2021-07-02 ENCOUNTER — Encounter: Payer: Self-pay | Admitting: Family

## 2021-07-02 ENCOUNTER — Ambulatory Visit (INDEPENDENT_AMBULATORY_CARE_PROVIDER_SITE_OTHER): Payer: BC Managed Care – PPO | Admitting: Family

## 2021-07-02 ENCOUNTER — Other Ambulatory Visit: Payer: Self-pay

## 2021-07-02 VITALS — BP 157/95 | HR 113 | Temp 98.1°F | Resp 18 | Ht 69.76 in | Wt 158.4 lb

## 2021-07-02 DIAGNOSIS — R030 Elevated blood-pressure reading, without diagnosis of hypertension: Secondary | ICD-10-CM

## 2021-07-02 DIAGNOSIS — Z7689 Persons encountering health services in other specified circumstances: Secondary | ICD-10-CM

## 2021-07-02 DIAGNOSIS — F419 Anxiety disorder, unspecified: Secondary | ICD-10-CM

## 2021-07-02 DIAGNOSIS — F32A Depression, unspecified: Secondary | ICD-10-CM

## 2021-07-02 MED ORDER — SERTRALINE HCL 25 MG PO TABS: 25.0000 mg | ORAL_TABLET | Freq: Every day | ORAL | 0 refills | Status: DC

## 2021-07-02 NOTE — Patient Instructions (Signed)
Generalized Anxiety Disorder, Adult Generalized anxiety disorder (GAD) is a mental health condition. Unlike normal worries, anxiety related to GAD is not triggered by a specific event. These worries do not fade or get better with time. GAD interferes with relationships, work, and school. GAD symptoms can vary from mild to severe. People with severe GAD can have intense waves of anxiety with physical symptoms that are similar to panic attacks. What are the causes? The exact cause of GAD is not known, but the following are believed to have an impact: Differences in natural brain chemicals. Genes passed down from parents to children. Differences in the way threats are perceived. Development during childhood. Personality. What increases the risk? The following factors may make you more likely to develop this condition: Being male. Having a family history of anxiety disorders. Being very shy. Experiencing very stressful life events, such as the death of a loved one. Having a very stressful family environment. What are the signs or symptoms? People with GAD often worry excessively about many things in their lives, such as their health and family. Symptoms may also include: Mental and emotional symptoms: Worrying excessively about natural disasters. Fear of being late. Difficulty concentrating. Fears that others are judging your performance. Physical symptoms: Fatigue. Headaches, muscle tension, muscle twitches, trembling, or feeling shaky. Feeling like your heart is pounding or beating very fast. Feeling out of breath or like you cannot take a deep breath. Having trouble falling asleep or staying asleep, or experiencing restlessness. Sweating. Nausea, diarrhea, or irritable bowel syndrome (IBS). Behavioral symptoms: Experiencing erratic moods or irritability. Avoidance of new situations. Avoidance of people. Extreme difficulty making decisions. How is this diagnosed? This condition  is diagnosed based on your symptoms and medical history. You will also have a physical exam. Your health care provider may perform tests to rule out other possible causes of your symptoms. To be diagnosed with GAD, a person must have anxiety that: Is out of his or her control. Affects several different aspects of his or her life, such as work and relationships. Causes distress that makes him or her unable to take part in normal activities. Includes at least three symptoms of GAD, such as restlessness, fatigue, trouble concentrating, irritability, muscle tension, or sleep problems. Before your health care provider can confirm a diagnosis of GAD, these symptoms must be present more days than they are not, and they must last for 6 months or longer. How is this treated? This condition may be treated with: Medicine. Antidepressant medicine is usually prescribed for long-term daily control. Anti-anxiety medicines may be added in severe cases, especially when panic attacks occur. Talk therapy (psychotherapy). Certain types of talk therapy can be helpful in treating GAD by providing support, education, and guidance. Options include: Cognitive behavioral therapy (CBT). People learn coping skills and self-calming techniques to ease their physical symptoms. They learn to identify unrealistic thoughts and behaviors and to replace them with more appropriate thoughts and behaviors. Acceptance and commitment therapy (ACT). This treatment teaches people how to be mindful as a way to cope with unwanted thoughts and feelings. Biofeedback. This process trains you to manage your body's response (physiological response) through breathing techniques and relaxation methods. You will work with a therapist while machines are used to monitor your physical symptoms. Stress management techniques. These include yoga, meditation, and exercise. A mental health specialist can help determine which treatment is best for you. Some  people see improvement with one type of therapy. However, other people require a combination   of therapies. Follow these instructions at home: Lifestyle Maintain a consistent routine and schedule. Anticipate stressful situations. Create a plan, and allow extra time to work with your plan. Practice stress management or self-calming techniques that you have learned from your therapist or your health care provider. General instructions Take over-the-counter and prescription medicines only as told by your health care provider. Understand that you are likely to have setbacks. Accept this and be kind to yourself as you persist to take better care of yourself. Recognize and accept your accomplishments, even if you judge them as small. Keep all follow-up visits as told by your health care provider. This is important. Contact a health care provider if: Your symptoms do not get better. Your symptoms get worse. You have signs of depression, such as: A persistently sad or irritable mood. Loss of enjoyment in activities that used to bring you joy. Change in weight or eating. Changes in sleeping habits. Avoiding friends or family members. Loss of energy for normal tasks. Feelings of guilt or worthlessness. Get help right away if: You have serious thoughts about hurting yourself or others. If you ever feel like you may hurt yourself or others, or have thoughts about taking your own life, get help right away. Go to your nearest emergency department or: Call your local emergency services (911 in the [place] Call a suicide crisis helpline, such as the National Suicide Prevention Lifeline at 1-800-273-8255. This is open 24 hours a day in the [place] Text the Crisis Text Line at 741741 (in the [place] Summary Generalized anxiety disorder (GAD) is a mental health condition that involves worry that is not triggered by a specific event. People with GAD often worry excessively about many things in their lives, such  as their health and family. GAD may cause symptoms such as restlessness, trouble concentrating, sleep problems, frequent sweating, nausea, diarrhea, headaches, and trembling or muscle twitching. A mental health specialist can help determine which treatment is best for you. Some people see improvement with one type of therapy. However, other people require a combination of therapies. This information is not intended to replace advice given to you by your health care provider. Make sure you discuss any questions you have with your health care provider. Document Revised: 09/27/2019 Document Reviewed: 09/27/2019 Elsevier Patient Education  2022 Elsevier Inc.  

## 2021-07-02 NOTE — Progress Notes (Signed)
Pt presents to establish care, pt states that he has been dealing with anxiety  Lost sister last Aug due to Afib and he stated his older brother has it now, it started out as anxiety

## 2021-07-20 ENCOUNTER — Encounter: Payer: Self-pay | Admitting: Family

## 2021-07-22 ENCOUNTER — Ambulatory Visit (INDEPENDENT_AMBULATORY_CARE_PROVIDER_SITE_OTHER): Payer: BC Managed Care – PPO | Admitting: Family

## 2021-07-22 ENCOUNTER — Other Ambulatory Visit: Payer: Self-pay | Admitting: Family

## 2021-07-22 ENCOUNTER — Other Ambulatory Visit: Payer: Self-pay

## 2021-07-22 VITALS — BP 152/98 | HR 99 | Temp 98.3°F | Resp 18 | Ht 69.76 in | Wt 156.6 lb

## 2021-07-22 DIAGNOSIS — F32A Depression, unspecified: Secondary | ICD-10-CM | POA: Diagnosis not present

## 2021-07-22 DIAGNOSIS — F419 Anxiety disorder, unspecified: Secondary | ICD-10-CM

## 2021-07-22 DIAGNOSIS — I10 Essential (primary) hypertension: Secondary | ICD-10-CM | POA: Insufficient documentation

## 2021-07-22 MED ORDER — LOSARTAN POTASSIUM-HCTZ 50-12.5 MG PO TABS: 1.0000 | ORAL_TABLET | Freq: Every day | ORAL | 0 refills | Status: DC

## 2021-07-22 NOTE — Progress Notes (Signed)
Pt presents for hypertension follow-up states that he has moments where he experiences palpitations not sure if this is stemming  from anxiety

## 2021-07-22 NOTE — Progress Notes (Signed)
Patient ID: Gregg [name], male    DOB: 27-Jan-1956  MRN: [id number]  CC: Blood Pressure Follow-Up   Subjective: Gregg [name] is a 65 y.o. male who presents for blood pressure follow-up.   His concerns today include:   BLOOD PRESSURE FOLLOW-UP: 07/02/2021: - Blood pressure elevated during today's visit. Patient asymptomatic without chest pressure, chest pain, shortness of breath, and worst headache of life.  -Follow-up in 2 weeks with primary provider for blood pressure check. Write down your blood pressure readings each day and bring those results along with your home blood pressure monitor to your appointment.   07/22/2021:  Since last visit noticed blood pressures increased to 140's/90's or more beginning 6 days ago. Endorses intermittent palpitations began around time blood pressures began to increase. Denies shortness of breath and chest pain. He is consistently monitoring blood pressure. Also, reports recently the music director at his church quit. 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 Not sure if this is increasing blood pressure and causing palpitations.   2. ANXIETY DEPRESSION FOLLOW-UP: 7/13/20222: - Begin Sertraline as prescribed.   07/22/2021: Doing well on current dose. No concerns. Reports feeling improvement and even since beginning Zoloft.   Depression screen Gregg [name] 2/9 07/22/2021 07/02/2021  Decreased Interest 0 0  Down, Depressed, Hopeless 0 0  PHQ - 2 Score 0 0  Altered sleeping 1 0  Tired, decreased energy 1 0  Change in appetite 1 0  Feeling bad or failure about yourself  0 0  Trouble concentrating 0 0  Moving slowly or fidgety/restless 0 0  Suicidal thoughts 0 0  PHQ-9 Score 3 0  Difficult doing work/chores Not difficult at all Not difficult at all    Patient Active Problem List   Diagnosis Date Noted   Anxiety and depression 07/02/2021   History of appendicitis 07/01/2021   Acute phlegmonous appendicitis s/p lap  appendectomy 03/07/2021 03/07/2021   Kidney stone 03/07/2021   Bilateral inguinal hernia (BIH) s/p lap repair with mesh 06/06/2021 03/07/2021     Current Outpatient Medications on File Prior to Visit  Medication Sig Dispense Refill   sertraline (ZOLOFT) 25 MG tablet Take 1 tablet (25 mg total) by mouth daily. 30 tablet 0   acetaminophen (TYLENOL) 500 MG tablet Take 1,000 mg by mouth every 6 (six) hours as needed. (Patient not taking: Reported on 07/02/2021)     No current facility-administered medications on file prior to visit.    No Known Allergies  Social History   Socioeconomic History   Marital status: Single    Spouse name: Not on file   Number of children: Not on file   Years of education: Not on file   Highest education level: Not on file  Occupational History   Not on file  Tobacco Use   Smoking status: Former    Packs/day: 0.50    Years: 40.00    Pack years: 20.00    Types: Cigarettes    Quit date: 03/29/2017    Years since quitting: 4.3   Smokeless tobacco: Never  Vaping Use   Vaping Use: Never used  Substance and Sexual Activity   Alcohol use: Yes    Comment: rare   Drug use: Never   Sexual activity: Not on file  Other Topics Concern   Not on file  Social History Narrative   Not on file   Social Determinants of Health   Financial Resource Strain: Not on file  Food  Insecurity: Not on file  Transportation Needs: Not on file  Physical Activity: Not on file  Stress: Not on file  Social Connections: Not on file  Intimate Partner Violence: Not on file    No family history on file.  Past Surgical History:  Procedure Laterality Date   EXTRACORPOREAL SHOCK WAVE LITHOTRIPSY Left 02/23/2019   Procedure: EXTRACORPOREAL SHOCK WAVE LITHOTRIPSY (ESWL);  Surgeon: Bjorn Loser, MD;  Location: [facility];  Service: Urology;  Laterality: Left;   INGUINAL HERNIA REPAIR Bilateral 06/06/2021   Procedure: LAPAROSCOPIC BILATERAL INGUINAL HERNIA REPAIRS WITH MESH;   Surgeon: Michael Boston, MD;  Location: [facility];  Service: General;  Laterality: Bilateral;  TAP BLOCK   LAPAROSCOPIC APPENDECTOMY N/A 03/07/2021   Procedure: APPENDECTOMY LAPAROSCOPIC;  Surgeon: Michael Boston, MD;  Location: [facility];  Service: General;  Laterality: N/A;    ROS: Review of Systems Negative except as stated above  PHYSICAL EXAM: BP (!) 152/98 (BP Location: Left Arm, Patient Position: Sitting, Cuff Size: Normal)   Pulse 99   Temp 98.3 F (36.8 C)   Resp 18   Ht 5' 9.76" (1.772 m)   Wt 156 lb 9.6 oz (71 kg)   SpO2 96%   BMI 22.62 kg/m   Physical Exam HENT:     Head: Normocephalic and atraumatic.  Eyes:     Extraocular Movements: Extraocular movements intact.     Conjunctiva/sclera: Conjunctivae normal.     Pupils: Pupils are equal, round, and reactive to light.  Cardiovascular:     Rate and Rhythm: Normal rate and regular rhythm.     Pulses: Normal pulses.     Heart sounds: Normal heart sounds.  Pulmonary:     Effort: Pulmonary effort is normal.     Breath sounds: Normal breath sounds.  Musculoskeletal:     Cervical back: Normal range of motion and neck supple.  Neurological:     General: No focal deficit present.     Mental Status: He is alert and oriented to person, place, and time.  Psychiatric:        Mood and Affect: Mood normal.        Behavior: Behavior normal.    ASSESSMENT AND PLAN: 1. Essential hypertension: - Newly diagnosed hypertension.  - Begin Losartan-Hydrochlorothiazide as prescribed.  - Counseled on blood pressure goal of less than 130/80, low-sodium, DASH diet, medication compliance, 150 minutes of moderate intensity exercise per week as tolerated. Discussed medication compliance, adverse effects. - BMP to evaluate kidney function and electrolyte balance. - Follow-up with primary provider in 2 weeks or sooner if needed.  - losartan-hydrochlorothiazide (HYZAAR) 50-12.5 MG tablet; Take 1 tablet by mouth daily.   Dispense: 30 tablet; Refill: 0 - Basic Metabolic Panel  2. Anxiety and depression: - Patient denies thoughts of self-harm, suicidal ideations, and homicidal ideations.  - Doing well on current dose of Sertraline. Continue as prescribed.  - Counseled patient that while palpitations is a possible side effect of Sertraline suspect that palpitations are coming from elevated blood pressures as he noticed palpitations began when his blood pressures increased. Will continue Sertraline for now. If palpitations continue with improvement of blood pressure will consider holding Sertraline to see if palpitations subside. Patient agreeable.     Patient was given the opportunity to ask questions.  Patient verbalized understanding of the plan and was able to repeat key elements of the plan. Patient was given clear instructions to go to Emergency Department or return to medical center if symptoms don't  improve, worsen, or new problems develop.The patient verbalized understanding.   Orders Placed This Encounter  Procedures   Basic Metabolic Panel     Requested Prescriptions   Signed Prescriptions Disp Refills   losartan-hydrochlorothiazide (HYZAAR) 50-12.5 MG tablet 30 tablet 0    Sig: Take 1 tablet by mouth daily.    Return in about 2 weeks (around 08/05/2021) for Follow-Up or next available hypertension and anxiety .  Camillia Herter, NP

## 2021-07-23 LAB — BASIC METABOLIC PANEL
BUN/Creatinine Ratio: 18 (ref 10–24)
BUN: 17 mg/dL (ref 8–27)
CO2: 25 mmol/L (ref 20–29)
Calcium: 9.9 mg/dL (ref 8.6–10.2)
Chloride: 99 mmol/L (ref 96–106)
Creatinine, Ser: 0.92 mg/dL (ref 0.76–1.27)
Glucose: 93 mg/dL (ref 65–99)
Potassium: 4.4 mmol/L (ref 3.5–5.2)
Sodium: 141 mmol/L (ref 134–144)
eGFR: 93 mL/min/{1.73_m2} (ref >59)

## 2021-07-23 NOTE — Progress Notes (Signed)
Kidney function normal

## 2021-07-24 MED ORDER — SERTRALINE HCL 25 MG PO TABS: 25.0000 mg | ORAL_TABLET | Freq: Every day | ORAL | 0 refills | Status: DC

## 2021-08-05 ENCOUNTER — Telehealth: Payer: BC Managed Care – PPO | Admitting: Family

## 2021-08-06 NOTE — Progress Notes (Signed)
Patient ID: Gregg [name], male    DOB: 1955/12/23  MRN: [id number]  CC: Hypertension Follow-Up  Subjective: Gregg [name] is a 65 y.o. male who presents for hypertension follow-up.   His concerns today include:   HYPERTENSION FOLLOW-UP: 07/22/2021: - Newly diagnosed hypertension.  - Begin Losartan-Hydrochlorothiazide as prescribed.  - Follow-up with primary provider in 2 weeks or sooner if needed.   08/08/2021: Doing well on current regimen. Denies shortness of breath and chest pain. Reports feeling better. Checking blood pressures at home on consistent basis and have been lower than in the past.   2. ANXIETY DEPRESSION FOLLOW-UP: 3. SHOULDER DISCOMFORT: Doing well on current Zoloft dose. Concern for intermittent bilateral shoulder discomfort but not pain. Most recent episode lasted 30 minutes. Denies radiation, numbness, tingling, chest pain, chest pressure, and changes in speech or thoughts. Noticed it several weeks ago but contributed it to activities around the home. More recently does not think this is related to any activities. Denies any heavy lifting, pushing, and pulling. Reports notices it typically after taking Zoloft dose. Currently taking Zoloft around 7 am. He is not ready to change Zoloft regimen as of present.   4. ALLERGIES: Every night having feeling head is stopped up. Usually worse when weather is warmer. Using over-the-counter Claritin and helping.   Depression screen [facility] 2/9 08/08/2021 07/22/2021 07/02/2021  Decreased Interest 0 0 0  Down, Depressed, Hopeless 0 0 0  PHQ - 2 Score 0 0 0  Altered sleeping 1 1 0  Tired, decreased energy 0 1 0  Change in appetite 0 1 0  Feeling bad or failure about yourself  0 0 0  Trouble concentrating 0 0 0  Moving slowly or fidgety/restless 0 0 0  Suicidal thoughts 0 0 0  PHQ-9 Score 1 3 0  Difficult doing work/chores Not difficult at all Not difficult at all Not difficult at all    Patient Active Problem List   Diagnosis Date  Noted   Essential hypertension 07/22/2021   Anxiety and depression 07/02/2021   History of appendicitis 07/01/2021   Acute phlegmonous appendicitis s/p lap appendectomy 03/07/2021 03/07/2021   Kidney stone 03/07/2021   Bilateral inguinal hernia (BIH) s/p lap repair with mesh 06/06/2021 03/07/2021     Current Outpatient Medications on File Prior to Visit  Medication Sig Dispense Refill   acetaminophen (TYLENOL) 500 MG tablet Take 1,000 mg by mouth every 6 (six) hours as needed. (Patient not taking: Reported on 07/02/2021)     sertraline (ZOLOFT) 25 MG tablet Take 1 tablet (25 mg total) by mouth daily. 30 tablet 0   No current facility-administered medications on file prior to visit.    No Known Allergies  Social History   Socioeconomic History   Marital status: Single    Spouse name: Not on file   Number of children: Not on file   Years of education: Not on file   Highest education level: Not on file  Occupational History   Not on file  Tobacco Use   Smoking status: Former    Packs/day: 0.50    Years: 40.00    Pack years: 20.00    Types: Cigarettes    Quit date: 03/29/2017    Years since quitting: 4.3   Smokeless tobacco: Never  Vaping Use   Vaping Use: Never used  Substance and Sexual Activity   Alcohol use: Yes    Comment: rare   Drug use: Never   Sexual activity: Not on file  Other Topics Concern   Not on file  Social History Narrative   Not on file   Social Determinants of Health   Financial Resource Strain: Not on file  Food Insecurity: Not on file  Transportation Needs: Not on file  Physical Activity: Not on file  Stress: Not on file  Social Connections: Not on file  Intimate Partner Violence: Not on file    No family history on file.  Past Surgical History:  Procedure Laterality Date   EXTRACORPOREAL SHOCK WAVE LITHOTRIPSY Left 02/23/2019   Procedure: EXTRACORPOREAL SHOCK WAVE LITHOTRIPSY (ESWL);  Surgeon: Bjorn Loser, MD;  Location: [facility];   Service: Urology;  Laterality: Left;   INGUINAL HERNIA REPAIR Bilateral 06/06/2021   Procedure: LAPAROSCOPIC BILATERAL INGUINAL HERNIA REPAIRS WITH MESH;  Surgeon: Michael Boston, MD;  Location: [facility] [facility];  Service: General;  Laterality: Bilateral;  TAP BLOCK   LAPAROSCOPIC APPENDECTOMY N/A 03/07/2021   Procedure: APPENDECTOMY LAPAROSCOPIC;  Surgeon: Michael Boston, MD;  Location: [facility];  Service: General;  Laterality: N/A;    ROS: Review of Systems Negative except as stated above  PHYSICAL EXAM: BP 123/71 (BP Location: Left Arm, Patient Position: Sitting, Cuff Size: Normal)   Pulse 96   Temp 98.3 F (36.8 C)   Resp 18   Ht 5' 9.76" (1.772 m)   Wt 156 lb 12.8 oz (71.1 kg)   SpO2 97%   BMI 22.65 kg/m   Physical Exam HENT:     Head: Normocephalic and atraumatic.  Eyes:     Extraocular Movements: Extraocular movements intact.     Conjunctiva/sclera: Conjunctivae normal.     Pupils: Pupils are equal, round, and reactive to light.  Cardiovascular:     Rate and Rhythm: Normal rate and regular rhythm.     Pulses: Normal pulses.     Heart sounds: Normal heart sounds.  Pulmonary:     Effort: Pulmonary effort is normal.     Breath sounds: Normal breath sounds.  Musculoskeletal:     Cervical back: Normal range of motion and neck supple.  Neurological:     General: No focal deficit present.     Mental Status: He is alert and oriented to person, place, and time.  Psychiatric:        Mood and Affect: Mood normal.        Behavior: Behavior normal.    ASSESSMENT AND PLAN: 1. Essential hypertension: - Blood pressure at goal today in office. Patient monitoring blood pressures at home. Discussed with patient concerns for home blood pressure monitor needing to be properly calibrated.  - Continue Losartan-Hydrochlorothiazide as prescribed.  - Counseled on blood pressure goal of less than 130/80, low-sodium, DASH diet, medication compliance, 150 minutes of moderate  intensity exercise per week as tolerated. Discussed medication compliance, adverse effects. - Follow-up with primary provider in 3 months or sooner if needed.  - losartan-hydrochlorothiazide (HYZAAR) 50-12.5 MG tablet; Take 1 tablet by mouth daily.  Dispense: 90 tablet; Refill: 0  2. Anxiety and depression: 3. Acute pain of both shoulders: - Patient denies thoughts of self-harm, suicidal ideations, and homicidal ideations. - Patient has concerns that Sertraline may be causing intermittent bilateral shoulder discomfort. Patient denies any red flag symptoms.  - Counseled patient that shoulder discomfort is not an expected side effect of Sertraline. Offered to decrease dose or to discontinue use for 5 days to monitor. Patient declined offers as of present as he is doing well on current regimen otherwise. Reports he does not need  medication refills until mid-September. - Continue Sertraline as prescribed.  - Counseled patient to closely monitor shoulder discomfort and to follow-up with me in 2 weeks or sooner if needed. Patient verbalized understanding.  4. Allergy, initial encounter: - Continue over-the-counter Claritin.  - Follow-up with primary provider as scheduled.     Patient was given the opportunity to ask questions.  Patient verbalized understanding of the plan and was able to repeat key elements of the plan. Patient was given clear instructions to go to Emergency Department or return to medical center if symptoms don't improve, worsen, or new problems develop.The patient verbalized understanding.    Requested Prescriptions   Signed Prescriptions Disp Refills   losartan-hydrochlorothiazide (HYZAAR) 50-12.5 MG tablet 90 tablet 0    Sig: Take 1 tablet by mouth daily.    Return in about 3 months (around 11/08/2021) for Follow-Up or next available hypertension and 2 weeks shoulder discomfort.  Camillia Herter, NP

## 2021-08-08 ENCOUNTER — Ambulatory Visit (INDEPENDENT_AMBULATORY_CARE_PROVIDER_SITE_OTHER): Payer: BC Managed Care – PPO | Admitting: Family

## 2021-08-08 ENCOUNTER — Other Ambulatory Visit: Payer: Self-pay

## 2021-08-08 VITALS — BP 123/71 | HR 96 | Temp 98.3°F | Resp 18 | Ht 69.76 in | Wt 156.8 lb

## 2021-08-08 DIAGNOSIS — F419 Anxiety disorder, unspecified: Secondary | ICD-10-CM | POA: Diagnosis not present

## 2021-08-08 DIAGNOSIS — T7840XA Allergy, unspecified, initial encounter: Secondary | ICD-10-CM | POA: Diagnosis not present

## 2021-08-08 DIAGNOSIS — M25511 Pain in right shoulder: Secondary | ICD-10-CM

## 2021-08-08 DIAGNOSIS — I10 Essential (primary) hypertension: Secondary | ICD-10-CM | POA: Diagnosis not present

## 2021-08-08 DIAGNOSIS — F32A Depression, unspecified: Secondary | ICD-10-CM

## 2021-08-08 DIAGNOSIS — M25512 Pain in left shoulder: Secondary | ICD-10-CM

## 2021-08-08 MED ORDER — LOSARTAN POTASSIUM-HCTZ 50-12.5 MG PO TABS
1.0000 | ORAL_TABLET | Freq: Every day | ORAL | 0 refills | Status: DC
Start: 2021-08-08 — End: 2021-11-20

## 2021-08-08 NOTE — Progress Notes (Signed)
Pt presents for hypertension follow up.the patient reports doing ok w/Losartan,pt brought home BP monitor reading 143/90.

## 2021-08-15 ENCOUNTER — Encounter: Payer: Self-pay | Admitting: Family

## 2021-08-18 ENCOUNTER — Other Ambulatory Visit: Payer: Self-pay | Admitting: Family

## 2021-08-18 DIAGNOSIS — F419 Anxiety disorder, unspecified: Secondary | ICD-10-CM

## 2021-08-18 DIAGNOSIS — F32A Depression, unspecified: Secondary | ICD-10-CM

## 2021-08-18 MED ORDER — SERTRALINE HCL 25 MG PO TABS: 25.0000 mg | ORAL_TABLET | Freq: Every day | ORAL | 0 refills | Status: DC

## 2021-08-19 ENCOUNTER — Telehealth: Payer: Self-pay | Admitting: Family

## 2021-08-19 ENCOUNTER — Telehealth: Payer: BC Managed Care – PPO | Admitting: Physician Assistant

## 2021-08-19 DIAGNOSIS — J309 Allergic rhinitis, unspecified: Secondary | ICD-10-CM

## 2021-08-19 MED ORDER — FLUTICASONE PROPIONATE 50 MCG/ACT NA SUSP: 2.0000 | Freq: Every day | NASAL | 0 refills | Status: DC

## 2021-08-19 NOTE — Progress Notes (Signed)
Virtual Visit via Telephone Note  I connected with Gregg [name], on 08/20/2021 at 2:33 PM by telephone due to the COVID-19 pandemic and verified that I am speaking with the correct person using two identifiers.  Due to current restrictions/limitations of in-office visits due to the COVID-19 pandemic, this scheduled clinical appointment was converted to a telehealth visit.   Consent: I discussed the limitations, risks, security and privacy concerns of performing an evaluation and management service by telephone and the availability of in person appointments. I also discussed with the patient that there may be a patient responsible charge related to this service. The patient expressed understanding and agreed to proceed.   Location of Patient: Home  Location of Provider: [facility] [place] participating in Telemedicine visit: Gregg [name] Durene Fruits, NP Elmon Else, CMA   History of Present Illness: Gregg [name] is a 65 year-old male who presents for congestion.   HYPERTENSION FOLLOW-UP: Reports since last visit purchased a new blood pressure monitor. Today's reading is 112/76 and has been about this range when checking routinely at home.   2.  ANXIETY DEPRESSION FOLLOW-UP: 3.  SHOULDER PAIN FOLLOW-UP: Noticed about 6 days ago shoulder pain resolved. Taking full dosage of Sertraline and doing well.   Depression screen [facility] 2/9 08/08/2021 07/22/2021 07/02/2021  Decreased Interest 0 0 0  Down, Depressed, Hopeless 0 0 0  PHQ - 2 Score 0 0 0  Altered sleeping 1 1 0  Tired, decreased energy 0 1 0  Change in appetite 0 1 0  Feeling bad or failure about yourself  0 0 0  Trouble concentrating 0 0 0  Moving slowly or fidgety/restless 0 0 0  Suicidal thoughts 0 0 0  PHQ-9 Score 1 3 0  Difficult doing work/chores Not difficult at all Not difficult at all Not difficult at all     Past Medical History:  Diagnosis Date   Anxiety    Bilateral inguinal  hernia 06/02/2021   GERD (gastroesophageal reflux disease)    History of kidney stones 2020   Sinus complaint 06/02/2021   x 1 week per pt   Wears glasses 06/02/2021   No Known Allergies  Current Outpatient Medications on File Prior to Visit  Medication Sig Dispense Refill   acetaminophen (TYLENOL) 500 MG tablet Take 1,000 mg by mouth every 6 (six) hours as needed. (Patient not taking: Reported on 07/02/2021)     fluticasone (FLONASE) 50 MCG/ACT nasal spray Place 2 sprays into both nostrils daily. 16 g 0   losartan-hydrochlorothiazide (HYZAAR) 50-12.5 MG tablet Take 1 tablet by mouth daily. 90 tablet 0   sertraline (ZOLOFT) 25 MG tablet Take 1 tablet (25 mg total) by mouth daily. 30 tablet 0   No current facility-administered medications on file prior to visit.    Observations/Objective: Alert and oriented x 3. Not in acute distress. Physical examination not completed as this is a telemedicine visit.  Assessment and Plan: 1. Essential hypertension: - Home blood pressures 110's/70's - Continue Losartan-Hydrochlorothiazide as prescribed.  - Counseled on blood pressure goal of less than 130/80, low-sodium, DASH diet, medication compliance, 150 minutes of moderate intensity exercise per week as tolerated. Discussed medication compliance, adverse effects. - Follow-up with primary provider in 3 months or sooner if needed.   2. Anxiety and depression: 3. Acute pain of both shoulders: - Patient denies thoughts of self-harm, suicidal ideations, and homicidal ideations.  - Since last visit shoulder pain has resolved.  -  Continue Sertraline as prescribed.  - Follow-up with primary provider as scheduled.  - sertraline (ZOLOFT) 25 MG tablet; Take 1 tablet (25 mg total) by mouth daily.  Dispense: 90 tablet; Refill: 0   Follow Up Instructions: Follow-up with primary provider in 3 months or sooner if needed.    Patient was given clear instructions to go to Emergency Department or return to  medical center if symptoms don't improve, worsen, or new problems develop.The patient verbalized understanding.  I discussed the assessment and treatment plan with the patient. The patient was provided an opportunity to ask questions and all were answered. The patient agreed with the plan and demonstrated an understanding of the instructions.   The patient was advised to call back or seek an in-person evaluation if the symptoms worsen or if the condition fails to improve as anticipated.    I provided 5 minutes total of non-face-to-face time during this encounter.   Camillia Herter, NP  [facility] [place], [place] 08/20/2021, 2:33 PM

## 2021-08-19 NOTE — Progress Notes (Signed)
E visit for Allergic Rhinitis We are sorry that you are not feeling well.  Here is how we plan to help!  Based on what you have shared with me it looks like you have Allergic Rhinitis.  Rhinitis is when a reaction occurs that causes nasal congestion, runny nose, sneezing, and itching.  Most types of rhinitis are caused by an inflammation and are associated with symptoms in the eyes ears or throat. There are several types of rhinitis.  The most common are acute rhinitis, which is usually caused by a viral illness, allergic or seasonal rhinitis, and nonallergic or year-round rhinitis.  Nasal allergies occur certain times of the year.  Allergic rhinitis is caused when allergens in the air trigger the release of histamine in the body.  Histamine causes itching, swelling, and fluid to build up in the fragile linings of the nasal passages, sinuses and eyelids.  An itchy nose and clear discharge are common.  I recommend the following over the counter treatments: Xyzal 5 mg take 1 tablet daily  I also would recommend a nasal spray: Flonase 2 sprays into each nostril once daily -- I have sent in a prescription for you to your preferred pharmacy.  Thankfully with breathing this seems more a congestion issue that true reactive airway. This should improve with the medication above and recommendations below but if not, you need an in-person evaluation. Please do not delay care.   HOME CARE:  You can use an over-the-counter saline nasal spray as needed Avoid areas where there is heavy dust, mites, or molds Stay indoors on windy days during the pollen season Keep windows closed in home, at least in bedroom; use air conditioner. Use high-efficiency house air filter Keep windows closed in car, turn AC on re-circulate Avoid playing out with dog during pollen season  GET HELP RIGHT AWAY IF:  If your symptoms do not improve within 10 days You become short of breath You develop yellow or green discharge from  your nose for over 3 days You have coughing fits  MAKE SURE YOU:  Understand these instructions Will watch your condition Will get help right away if you are not doing well or get worse  Thank you for choosing an e-visit. Your e-visit answers were reviewed by a board certified advanced clinical practitioner to complete your personal care plan. Depending upon the condition, your plan could have included both over the counter or prescription medications. Please review your pharmacy choice. Be sure that the pharmacy you have chosen is open so that you can pick up your prescription now.  If there is a problem you may message your provider in [place] to have the prescription routed to another pharmacy. Your safety is important to [place]. If you have drug allergies check your prescription carefully.  For the next 24 hours, you can use MyChart to ask questions about today's visit, request a non-urgent call back, or ask for a work or school excuse from your e-visit provider. You will get an email in the next two days asking about your experience. I hope that your e-visit has been valuable and will speed your recovery.

## 2021-08-19 NOTE — Telephone Encounter (Signed)
Appt scheduled to discuss with provider

## 2021-08-19 NOTE — Progress Notes (Signed)
Message sent to patient requesting further input regarding current symptoms. Awaiting patient response.  

## 2021-08-19 NOTE — Telephone Encounter (Signed)
.  [name], zimmerly are scheduled for a virtual visit with your provider today.    Just as we do with appointments in the office, we must obtain your consent to participate.  Your consent will be active for this visit and any virtual visit you may have with one of our providers in the next 365 days.    If you have a MyChart account, I can also send a copy of this consent to you electronically.  All virtual visits are billed to your insurance company just like a traditional visit in the office.  As this is a virtual visit, video technology does not allow for your provider to perform a traditional examination.  This may limit your provider's ability to fully assess your condition.  If your provider identifies any concerns that need to be evaluated in person or the need to arrange testing such as labs, EKG, etc, we will make arrangements to do so.    Although advances in technology are sophisticated, we cannot ensure that it will always work on either your end or our end.  If the connection with a video visit is poor, we may have to switch to a telephone visit.  With either a video or telephone visit, we are not always able to ensure that we have a secure connection.   I need to obtain your verbal consent now.   Are you willing to proceed with your visit today?   Gregg [name] has provided verbal consent on 08/19/2021 for a virtual visit (video or telephone).   Gregg [name] 08/19/2021  4:03 PM

## 2021-08-19 NOTE — Progress Notes (Signed)
I have spent 5 minutes in review of e-visit questionnaire, review and updating patient chart, medical decision making and response to patient.   Jacek Colson Cody Nguyen Butler, PA-C    

## 2021-08-19 NOTE — Addendum Note (Signed)
Addended by: Brunetta Jeans on: 08/19/2021 09:07 AM   Modules accepted: Orders

## 2021-08-20 ENCOUNTER — Ambulatory Visit (INDEPENDENT_AMBULATORY_CARE_PROVIDER_SITE_OTHER): Payer: BC Managed Care – PPO | Admitting: Family

## 2021-08-20 ENCOUNTER — Other Ambulatory Visit: Payer: Self-pay

## 2021-08-20 DIAGNOSIS — M25511 Pain in right shoulder: Secondary | ICD-10-CM | POA: Diagnosis not present

## 2021-08-20 DIAGNOSIS — I10 Essential (primary) hypertension: Secondary | ICD-10-CM

## 2021-08-20 DIAGNOSIS — M25512 Pain in left shoulder: Secondary | ICD-10-CM

## 2021-08-20 DIAGNOSIS — F419 Anxiety disorder, unspecified: Secondary | ICD-10-CM

## 2021-08-20 DIAGNOSIS — F32A Depression, unspecified: Secondary | ICD-10-CM

## 2021-08-20 MED ORDER — SERTRALINE HCL 25 MG PO TABS: 25.0000 mg | ORAL_TABLET | Freq: Every day | ORAL | 0 refills | Status: DC

## 2021-08-20 NOTE — Progress Notes (Signed)
Pt presents for telemedicine visit for hypertension and shoulder discomfort states the pain left after last visit

## 2021-08-22 ENCOUNTER — Telehealth: Payer: BC Managed Care – PPO | Admitting: Family

## 2021-08-23 ENCOUNTER — Telehealth: Payer: BC Managed Care – PPO | Admitting: Physician Assistant

## 2021-08-23 DIAGNOSIS — J208 Acute bronchitis due to other specified organisms: Secondary | ICD-10-CM

## 2021-08-23 MED ORDER — BENZONATATE 100 MG PO CAPS: 100.0000 mg | ORAL_CAPSULE | Freq: Three times a day (TID) | ORAL | 0 refills | Status: DC | PRN

## 2021-08-23 MED ORDER — PREDNISONE 10 MG (21) PO TBPK: ORAL_TABLET | ORAL | 0 refills | Status: DC

## 2021-08-23 NOTE — Progress Notes (Signed)
We are sorry that you are not feeling well.  Here is how we plan to help!  Based on your presentation I believe you most likely have A cough due to a virus.  This is called viral bronchitis and is best treated by rest, plenty of fluids and control of the cough.  You may use Ibuprofen or Tylenol as directed to help your symptoms.     In addition you may use A prescription cough medication called Tessalon Perles 136m. You may take 1-2 capsules every 8 hours as needed for your cough.  Prednisone 10 mg daily for 6 days (see taper instructions below)  Directions for 6 day taper: Day 1: 2 tablets before breakfast, 1 after both lunch & dinner and 2 at bedtime Day 2: 1 tab before breakfast, 1 after both lunch & dinner and 2 at bedtime Day 3: 1 tab at each meal & 1 at bedtime Day 4: 1 tab at breakfast, 1 at lunch, 1 at bedtime Day 5: 1 tab at breakfast & 1 tab at bedtime Day 6: 1 tab at breakfast  From your responses in the eVisit questionnaire you describe inflammation in the upper respiratory tract which is causing a significant cough.  This is commonly called Bronchitis and has four common causes:   Allergies Viral Infections Acid Reflux Bacterial Infection Allergies, viruses and acid reflux are treated by controlling symptoms or eliminating the cause. An example might be a cough caused by taking certain blood pressure medications. You stop the cough by changing the medication. Another example might be a cough caused by acid reflux. Controlling the reflux helps control the cough.  USE OF BRONCHODILATOR ("RESCUE") INHALERS: There is a risk from using your bronchodilator too frequently.  The risk is that over-reliance on a medication which only relaxes the muscles surrounding the breathing tubes can reduce the effectiveness of medications prescribed to reduce swelling and congestion of the tubes themselves.  Although you feel brief relief from the bronchodilator inhaler, your asthma may actually be  worsening with the tubes becoming more swollen and filled with mucus.  This can delay other crucial treatments, such as oral steroid medications. If you need to use a bronchodilator inhaler daily, several times per day, you should discuss this with your provider.  There are probably better treatments that could be used to keep your asthma under control.     HOME CARE Only take medications as instructed by your medical team. Complete the entire course of an antibiotic. Drink plenty of fluids and get plenty of rest. Avoid close contacts especially the very young and the elderly Cover your mouth if you cough or cough into your sleeve. Always remember to wash your hands A steam or ultrasonic humidifier can help congestion.   GET HELP RIGHT AWAY IF: You develop worsening fever. You become short of breath You cough up blood. Your symptoms persist after you have completed your treatment plan MAKE SURE YOU  Understand these instructions. Will watch your condition. Will get help right away if you are not doing well or get worse.    Thank you for choosing an e-visit.  Your e-visit answers were reviewed by a board certified advanced clinical practitioner to complete your personal care plan. Depending upon the condition, your plan could have included both over the counter or prescription medications.  Please review your pharmacy choice. Make sure the pharmacy is open so you can pick up prescription now. If there is a problem, you may contact your provider  through [employer] and have the prescription routed to another pharmacy.  Your safety is important to [place]. If you have drug allergies check your prescription carefully.   For the next 24 hours you can use MyChart to ask questions about today's visit, request a non-urgent call back, or ask for a work or school excuse. You will get an email in the next two days asking about your experience. I hope that your e-visit has been valuable and will  speed your recovery.  I provided 5 minutes of non face-to-face time during this encounter for chart review and documentation.

## 2021-09-05 NOTE — Progress Notes (Signed)
Patient ID: Gregg [name], male    DOB: 1956-03-27  MRN: [id number]  CC: Annual Physical Exam  Subjective: Gregg [name] is a 65 y.o. male who presents for annual physical exam.  His concerns today include:  Reports had shortness of breath shortly after beginning Prednisone prescribed 08/23/2021 related to viral bronchitis during a visit with [facility].   Reports since then shortness of breath has resolved. Denies chest pain. Declines chest x-ray today. Will update provider if symptoms worsen and/or becomes severe.   Patient Active Problem List   Diagnosis Date Noted   Essential hypertension 07/22/2021   Anxiety and depression 07/02/2021   History of appendicitis 07/01/2021   Acute phlegmonous appendicitis s/p lap appendectomy 03/07/2021 03/07/2021   Kidney stone 03/07/2021   Bilateral inguinal hernia (BIH) s/p lap repair with mesh 06/06/2021 03/07/2021     Current Outpatient Medications on File Prior to Visit  Medication Sig Dispense Refill   acetaminophen (TYLENOL) 500 MG tablet Take 1,000 mg by mouth every 6 (six) hours as needed. (Patient not taking: Reported on 07/02/2021)     benzonatate (TESSALON) 100 MG capsule Take 1 capsule (100 mg total) by mouth 3 (three) times daily as needed. 30 capsule 0   fluticasone (FLONASE) 50 MCG/ACT nasal spray Place 2 sprays into both nostrils daily. 16 g 0   losartan-hydrochlorothiazide (HYZAAR) 50-12.5 MG tablet Take 1 tablet by mouth daily. 90 tablet 0   predniSONE (STERAPRED UNI-PAK 21 TAB) 10 MG (21) TBPK tablet 6 day taper; take as directed on package instructions 21 tablet 0   sertraline (ZOLOFT) 25 MG tablet Take 1 tablet (25 mg total) by mouth daily. 90 tablet 0   No current facility-administered medications on file prior to visit.    No Known Allergies  Social History   Socioeconomic History   Marital status: Single    Spouse name: Not on file   Number of children: Not on file   Years of education: Not on file    Highest education level: Not on file  Occupational History   Not on file  Tobacco Use   Smoking status: Former    Packs/day: 0.50    Years: 40.00    Pack years: 20.00    Types: Cigarettes    Quit date: 03/29/2017    Years since quitting: 4.4   Smokeless tobacco: Never  Vaping Use   Vaping Use: Never used  Substance and Sexual Activity   Alcohol use: Yes    Comment: rare   Drug use: Never   Sexual activity: Not on file  Other Topics Concern   Not on file  Social History Narrative   Not on file   Social Determinants of Health   Financial Resource Strain: Not on file  Food Insecurity: Not on file  Transportation Needs: Not on file  Physical Activity: Not on file  Stress: Not on file  Social Connections: Not on file  Intimate Partner Violence: Not on file    No family history on file.  Past Surgical History:  Procedure Laterality Date   EXTRACORPOREAL SHOCK WAVE LITHOTRIPSY Left 02/23/2019   Procedure: EXTRACORPOREAL SHOCK WAVE LITHOTRIPSY (ESWL);  Surgeon: Bjorn Loser, MD;  Location: [facility];  Service: Urology;  Laterality: Left;   INGUINAL HERNIA REPAIR Bilateral 06/06/2021   Procedure: LAPAROSCOPIC BILATERAL INGUINAL HERNIA REPAIRS WITH MESH;  Surgeon: Michael Boston, MD;  Location: [place];  Service: General;  Laterality: Bilateral;  TAP BLOCK   LAPAROSCOPIC APPENDECTOMY N/A 03/07/2021  Procedure: APPENDECTOMY LAPAROSCOPIC;  Surgeon: Michael Boston, MD;  Location: [facility];  Service: General;  Laterality: N/A;    ROS: Review of Systems Negative except as stated above  PHYSICAL EXAM: BP 136/87 (BP Location: Left Arm, Patient Position: Sitting, Cuff Size: Normal)   Pulse 92   Temp 98.1 F (36.7 C)   Resp 16   Ht 5' 9.76" (1.772 m)   Wt 153 lb (69.4 kg)   SpO2 96%   BMI 22.10 kg/m    Physical Exam HENT:     Head: Normocephalic and atraumatic.     Right Ear: Tympanic membrane, ear canal and external ear normal.     Left Ear: Tympanic  membrane, ear canal and external ear normal.  Eyes:     Extraocular Movements: Extraocular movements intact.     Conjunctiva/sclera: Conjunctivae normal.     Pupils: Pupils are equal, round, and reactive to light.  Cardiovascular:     Rate and Rhythm: Normal rate and regular rhythm.     Pulses: Normal pulses.     Heart sounds: Normal heart sounds.  Pulmonary:     Effort: Pulmonary effort is normal.     Breath sounds: Normal breath sounds.  Abdominal:     General: Bowel sounds are normal.     Palpations: Abdomen is soft.  Genitourinary:    Comments: Patient declined exam.  Musculoskeletal:        General: Normal range of motion.     Cervical back: Normal range of motion and neck supple.  Skin:    General: Skin is warm and dry.     Capillary Refill: Capillary refill takes less than 2 seconds.  Neurological:     General: No focal deficit present.     Mental Status: He is alert and oriented to person, place, and time.  Psychiatric:        Mood and Affect: Mood normal.        Behavior: Behavior normal.   ASSESSMENT AND PLAN: 1. Annual physical exam: - Counseled on 150 minutes of exercise per week as tolerated, healthy eating (including decreased daily intake of saturated fats, cholesterol, added sugars, sodium), STI prevention, and routine healthcare maintenance.  2. Screening for metabolic disorder: - BMP last obtained 07/22/2021 and normal at that time.  - Hepatic function panel to check liver function.  - Hepatic Function Panel  3. Screening for deficiency anemia: - CBC to screen for anemia. - CBC  4. Diabetes mellitus screening: - Hemoglobin A1c to screen for pre-diabetes/diabetes. - Hemoglobin A1c  5. Screening cholesterol level: - Lipid panel to screen for high cholesterol.  - Lipid panel  6. Thyroid disorder screen: - TSH to check thyroid function.  - TSH  7. Need for hepatitis C screening test: - Hepatitis C antibody to screen for hepatitis C.  - Hepatitis C  Antibody  8. Colon cancer screening: - Per patient preference Cologuard for colon cancer screening.  - Cologuard  9. Shortness of breath: - Patient reports had shortness of breath shortly after beginning Prednisone prescribed 08/23/2021 related to viral bronchitis during a visit with [facility]. Since then shortness of breath has resolved. Denies chest pain. Declines chest x-ray today. Will update provider if symptoms worsen and/or becomes severe.  - Follow-up with primary provider as scheduled.   Patient was given the opportunity to ask questions.  Patient verbalized understanding of the plan and was able to repeat key elements of the plan. Patient was given clear instructions to go  to Emergency Department or return to medical center if symptoms don't improve, worsen, or new problems develop.The patient verbalized understanding.   Orders Placed This Encounter  Procedures   Hepatitis C Antibody   Hepatic Function Panel   CBC   Lipid panel   TSH   Hemoglobin A1c   Cologuard     Requested Prescriptions    No prescriptions requested or ordered in this encounter    Return in about 1 year (around 09/09/2022) for Physical per patient preference.  Camillia Herter, NP

## 2021-09-09 ENCOUNTER — Other Ambulatory Visit: Payer: Self-pay

## 2021-09-09 ENCOUNTER — Ambulatory Visit (INDEPENDENT_AMBULATORY_CARE_PROVIDER_SITE_OTHER): Payer: BC Managed Care – PPO | Admitting: Family

## 2021-09-09 VITALS — BP 136/87 | HR 92 | Temp 98.1°F | Resp 16 | Ht 69.76 in | Wt 153.0 lb

## 2021-09-09 DIAGNOSIS — Z0000 Encounter for general adult medical examination without abnormal findings: Secondary | ICD-10-CM

## 2021-09-09 DIAGNOSIS — Z13228 Encounter for screening for other metabolic disorders: Secondary | ICD-10-CM

## 2021-09-09 DIAGNOSIS — Z130 Encounter for screening for diseases of the blood and blood-forming organs and certain disorders involving the immune mechanism: Secondary | ICD-10-CM

## 2021-09-09 DIAGNOSIS — R0602 Shortness of breath: Secondary | ICD-10-CM

## 2021-09-09 DIAGNOSIS — Z13220 Encounter for screening for lipoid disorders: Secondary | ICD-10-CM

## 2021-09-09 DIAGNOSIS — Z131 Encounter for screening for diabetes mellitus: Secondary | ICD-10-CM

## 2021-09-09 DIAGNOSIS — Z1159 Encounter for screening for other viral diseases: Secondary | ICD-10-CM

## 2021-09-09 DIAGNOSIS — Z1211 Encounter for screening for malignant neoplasm of colon: Secondary | ICD-10-CM

## 2021-09-09 DIAGNOSIS — Z1329 Encounter for screening for other suspected endocrine disorder: Secondary | ICD-10-CM

## 2021-09-09 NOTE — Patient Instructions (Addendum)
Preventive Care 40-64 Years Old, Male Preventive care refers to lifestyle choices and visits with your health care provider that can promote health and wellness. This includes: A yearly physical exam. This is also called an annual wellness visit. Regular dental and eye exams. Immunizations. Screening for certain conditions. Healthy lifestyle choices, such as: Eating a healthy diet. Getting regular exercise. Not using drugs or products that contain nicotine and tobacco. Limiting alcohol use. What can I expect for my preventive care visit? Physical exam Your health care provider will check your: Height and weight. These may be used to calculate your BMI (body mass index). BMI is a measurement that tells if you are at a healthy weight. Heart rate and blood pressure. Body temperature. Skin for abnormal spots. Counseling Your health care provider may ask you questions about your: Past medical problems. Family's medical history. Alcohol, tobacco, and drug use. Emotional well-being. Home life and relationship well-being. Sexual activity. Diet, exercise, and sleep habits. Work and work environment. Access to firearms. What immunizations do I need? Vaccines are usually given at various ages, according to a schedule. Your health care provider will recommend vaccines for you based on your age, medical history, and lifestyle or other factors, such as travel or where you work. What tests do I need? Blood tests Lipid and cholesterol levels. These may be checked every 5 years, or more often if you are over 50 years old. Hepatitis C test. Hepatitis B test. Screening Lung cancer screening. You may have this screening every year starting at age 55 if you have a 30-pack-year history of smoking and currently smoke or have quit within the past 15 years. Prostate cancer screening. Recommendations will vary depending on your family history and other risks. Genital exam to check for testicular cancer  or hernias. Colorectal cancer screening. All adults should have this screening starting at age 50 and continuing until age 75. Your health care provider may recommend screening at age 45 if you are at increased risk. You will have tests every 1-10 years, depending on your results and the type of screening test. Diabetes screening. This is done by checking your blood sugar (glucose) after you have not eaten for a while (fasting). You may have this done every 1-3 years. STD (sexually transmitted disease) testing, if you are at risk. Follow these instructions at home: Eating and drinking  Eat a diet that includes fresh fruits and vegetables, whole grains, lean protein, and low-fat dairy products. Take vitamin and mineral supplements as recommended by your health care provider. Do not drink alcohol if your health care provider tells you not to drink. If you drink alcohol: Limit how much you have to 0-2 drinks a day. Be aware of how much alcohol is in your drink. In the [place] one drink equals one 12 oz bottle of beer (355 mL), one 5 oz glass of wine (148 mL), or one 1 oz glass of hard liquor (44 mL). Lifestyle Take daily care of your teeth and gums. Brush your teeth every morning and night with fluoride toothpaste. Floss one time each day. Stay active. Exercise for at least 30 minutes 5 or more days each week. Do not use any products that contain nicotine or tobacco, such as cigarettes, e-cigarettes, and chewing tobacco. If you need help quitting, ask your health care provider. Do not use drugs. If you are sexually active, practice safe sex. Use a condom or other form of protection to prevent STIs (sexually transmitted infections). If told by your   health care provider, take low-dose aspirin daily starting at age 50. Find healthy ways to cope with stress, such as: Meditation, yoga, or listening to music. Journaling. Talking to a trusted person. Spending time with friends and  family. Safety Always wear your seat belt while driving or riding in a vehicle. Do not drive: If you have been drinking alcohol. Do not ride with someone who has been drinking. When you are tired or distracted. While texting. Wear a helmet and other protective equipment during sports activities. If you have firearms in your house, make sure you follow all gun safety procedures. What's next? Go to your health care provider once a year for an annual wellness visit. Ask your health care provider how often you should have your eyes and teeth checked. Stay up to date on all vaccines. This information is not intended to replace advice given to you by your health care provider. Make sure you discuss any questions you have with your health care provider. Document Revised: 02/14/2021 Document Reviewed: 12/01/2018 Elsevier Patient Education  2022 Elsevier Inc.   

## 2021-09-09 NOTE — Progress Notes (Signed)
Pt presents for annual physical exam, pt reports SOB after finishing course of prednisone and bronchitis flare, Pt wants to do Cologuard screening

## 2021-09-10 LAB — CBC
Hematocrit: 50.1 % (ref 37.5–51.0)
Hemoglobin: 17.3 g/dL (ref 13.0–17.7)
MCH: 31.5 pg (ref 26.6–33.0)
MCHC: 34.5 g/dL (ref 31.5–35.7)
MCV: 91 fL (ref 79–97)
Platelets: 228 10*3/uL (ref 150–450)
RBC: 5.5 x10E6/uL (ref 4.14–5.80)
RDW: 12.8 % (ref 11.6–15.4)
WBC: 7.1 10*3/uL (ref 3.4–10.8)

## 2021-09-10 LAB — HEMOGLOBIN A1C
Est. average glucose Bld gHb Est-mCnc: 114 mg/dL
Hgb A1c MFr Bld: 5.6 % (ref 4.8–5.6)

## 2021-09-10 LAB — HEPATIC FUNCTION PANEL
ALT: 25 IU/L (ref 0–44)
AST: 21 IU/L (ref 0–40)
Albumin: 4.6 g/dL (ref 3.8–4.8)
Alkaline Phosphatase: 95 IU/L (ref 44–121)
Bilirubin Total: 0.3 mg/dL (ref 0.0–1.2)
Bilirubin, Direct: <0.1 mg/dL (ref 0.00–0.40)
Total Protein: 7.2 g/dL (ref 6.0–8.5)

## 2021-09-10 LAB — LIPID PANEL
Chol/HDL Ratio: 6.2 ratio — ABNORMAL HIGH (ref 0.0–5.0)
Cholesterol, Total: 274 mg/dL — ABNORMAL HIGH (ref 100–199)
HDL: 44 mg/dL (ref >39)
LDL Chol Calc (NIH): 196 mg/dL — ABNORMAL HIGH (ref 0–99)
Triglycerides: 178 mg/dL — ABNORMAL HIGH (ref 0–149)
VLDL Cholesterol Cal: 34 mg/dL (ref 5–40)

## 2021-09-10 LAB — HEPATITIS C ANTIBODY: Hep C Virus Ab: <0.1 s/co ratio (ref 0.0–0.9)

## 2021-09-10 LAB — TSH: TSH: 0.403 u[IU]/mL — ABNORMAL LOW (ref 0.450–4.500)

## 2021-09-11 ENCOUNTER — Other Ambulatory Visit: Payer: Self-pay | Admitting: Family

## 2021-09-11 DIAGNOSIS — E785 Hyperlipidemia, unspecified: Secondary | ICD-10-CM | POA: Insufficient documentation

## 2021-09-11 MED ORDER — ATORVASTATIN CALCIUM 40 MG PO TABS: 40.0000 mg | ORAL_TABLET | Freq: Every day | ORAL | 0 refills | Status: DC

## 2021-09-11 NOTE — Progress Notes (Signed)
Liver function normal.   No diabetes.   No anemia.   Hepatitis C negative.   Thyroid function mildly lower than normal. Please call our office to schedule a lab only visit to have thyroid function rechecked in 4 to 6 weeks.   Cholesterol higher than expected. High cholesterol may increase risk of heart attack and/or stroke. Consider eating more fruits, vegetables, and lean baked meats such as chicken or fish. Moderate intensity exercise at least 150 minutes as tolerated per week may help as well.   Begin Atorvastatin for high cholesterol. Please call our office to have cholesterol rechecked at lab only visit in 6 to 8 weeks.  The following is for provider reference only: The 10-year ASCVD risk score (Arnett DK, et al., 2019) is: 19.2%   Values used to calculate the score:     Age: 65 years     Sex: Male     Is Non-Hispanic African American: No     Diabetic: No     Tobacco smoker: No     Systolic Blood Pressure: 161 mmHg     Is BP treated: Yes     HDL Cholesterol: 44 mg/dL     Total Cholesterol: 274 mg/dL

## 2021-09-24 ENCOUNTER — Other Ambulatory Visit: Payer: Self-pay | Admitting: Family

## 2021-09-24 DIAGNOSIS — R195 Other fecal abnormalities: Secondary | ICD-10-CM

## 2021-09-24 LAB — COLOGUARD: Cologuard: POSITIVE — AB

## 2021-09-24 NOTE — Progress Notes (Signed)
Cologuard positive. Referral to Gastroenterology for further evaluation and management. Patient should receive a call from their office within 2 weeks with appointment details.

## 2021-09-25 ENCOUNTER — Telehealth: Payer: Self-pay | Admitting: *Deleted

## 2021-09-25 NOTE — Telephone Encounter (Signed)
Opened in error

## 2021-09-25 NOTE — Telephone Encounter (Signed)
Dr. Bryan Lemma, This pt is coming in for a colonoscopy d/t a positive cologuard.  While prepping his chart for PV, I noted he has had a couple of surgeries I wanted to make you aware- he had an appendectomy 03-07-21 and a laparoscopic bilateral hernia repair with mesh in 06-06-21.  I know were a few months ago, but just making sure he is ok to proceed.  Thanks!  Cyril Mourning

## 2021-10-03 ENCOUNTER — Telehealth: Payer: Self-pay

## 2021-10-03 NOTE — Telephone Encounter (Signed)
Patient has called back to reschedule previsit appointment on 10/17.

## 2021-10-03 NOTE — Telephone Encounter (Signed)
Called patient at 2 pm for scheduled virtual pre-visit.  Phone went straight to voicemail.  Left a message for the patient to call [phone] before 2:10 pm in order to complete the visit today.    Called [phone] again at 2:29 pm and left message for the patient to call [phone] today before 5 pm in order to reschedule pre-visit appointment or will have to cancel upcoming procedure.

## 2021-10-06 ENCOUNTER — Ambulatory Visit (AMBULATORY_SURGERY_CENTER): Payer: Self-pay

## 2021-10-06 VITALS — Ht 69.0 in | Wt 161.0 lb

## 2021-10-06 DIAGNOSIS — R195 Other fecal abnormalities: Secondary | ICD-10-CM

## 2021-10-06 MED ORDER — PLENVU 140 G PO SOLR: 140.0000 g | ORAL | 0 refills | Status: DC

## 2021-10-06 NOTE — Progress Notes (Signed)
No egg or soy allergy known to patient  No issues with past sedation with any surgeries or procedures Patient denies ever being told they had issues or difficulty with intubation  No FH of Malignant Hyperthermia No diet pills per patient No home 02 use per patient  No blood thinners per patient  Pt denies issues with constipation   COVID 19 guidelines implemented in PV today with Pt and RN  Pt is fully vaccinated  for Covid    Due to the COVID-19 pandemic we are asking patients to follow certain guidelines.  Pt aware of COVID protocols and [facility] guidelines

## 2021-10-06 NOTE — Patient Instructions (Addendum)
You have been scheduled for a colonoscopy. Please follow written instructions given to you at your visit today.  Please pick up your prep supplies at the pharmacy within the next 1-3 days. If you use inhalers (even only as needed), please bring them with you on the day of your procedure.   

## 2021-10-16 ENCOUNTER — Encounter: Payer: Self-pay | Admitting: Certified Registered Nurse Anesthetist

## 2021-10-17 ENCOUNTER — Other Ambulatory Visit: Payer: Self-pay

## 2021-10-17 ENCOUNTER — Other Ambulatory Visit: Payer: BC Managed Care – PPO

## 2021-10-17 ENCOUNTER — Encounter: Payer: Self-pay | Admitting: Gastroenterology

## 2021-10-17 ENCOUNTER — Ambulatory Visit (AMBULATORY_SURGERY_CENTER): Payer: BC Managed Care – PPO | Admitting: Gastroenterology

## 2021-10-17 VITALS — BP 100/67 | HR 88 | Temp 97.8°F | Resp 20 | Ht 69.0 in | Wt 161.0 lb

## 2021-10-17 DIAGNOSIS — D124 Benign neoplasm of descending colon: Secondary | ICD-10-CM | POA: Diagnosis not present

## 2021-10-17 DIAGNOSIS — D123 Benign neoplasm of transverse colon: Secondary | ICD-10-CM

## 2021-10-17 DIAGNOSIS — R195 Other fecal abnormalities: Secondary | ICD-10-CM

## 2021-10-17 DIAGNOSIS — D128 Benign neoplasm of rectum: Secondary | ICD-10-CM

## 2021-10-17 DIAGNOSIS — K5730 Diverticulosis of large intestine without perforation or abscess without bleeding: Secondary | ICD-10-CM

## 2021-10-17 DIAGNOSIS — K641 Second degree hemorrhoids: Secondary | ICD-10-CM | POA: Diagnosis not present

## 2021-10-17 DIAGNOSIS — K621 Rectal polyp: Secondary | ICD-10-CM

## 2021-10-17 DIAGNOSIS — D120 Benign neoplasm of cecum: Secondary | ICD-10-CM

## 2021-10-17 DIAGNOSIS — D122 Benign neoplasm of ascending colon: Secondary | ICD-10-CM

## 2021-10-17 DIAGNOSIS — D129 Benign neoplasm of anus and anal canal: Secondary | ICD-10-CM

## 2021-10-17 MED ORDER — SODIUM CHLORIDE 0.9 % IV SOLN: 500.0000 mL | Freq: Once | INTRAVENOUS | Status: DC

## 2021-10-17 NOTE — Progress Notes (Signed)
0857 HR > 100 with esmolol 25 mg given IV, MD updated, vss

## 2021-10-17 NOTE — Progress Notes (Signed)
Report given to PACU, vss 

## 2021-10-17 NOTE — Progress Notes (Signed)
2241 Ephedrine 10 mg given IV due to low BP, MD updated.

## 2021-10-17 NOTE — Progress Notes (Signed)
0837 HR > 100 with esmolol 25 mg given IV, MD updated, vss

## 2021-10-17 NOTE — Progress Notes (Signed)
Called to room to assist during endoscopic procedure.  Patient ID and intended procedure confirmed with present staff. Received instructions for my participation in the procedure from the performing physician.  

## 2021-10-17 NOTE — Op Note (Signed)
[name]: Gregg [name] Procedure Date: 10/17/2021 8:35 AM MRN: [id number] Endoscopist: Gerrit Heck , MD Age: 65 Referring MD:  Date of Birth: Aug 09, 1956 Gender: Male Account #: [id number] Procedure:                Colonoscopy Indications:              This is the patient's first colonoscopy, Positive                            Cologuard test. No active GI symptoms and no known                            family history of colon cancer. Medicines:                Monitored Anesthesia Care Procedure:                Pre-Anesthesia Assessment:                           - Prior to the procedure, a History and Physical                            was performed, and patient medications and                            allergies were reviewed. The patient's tolerance of                            previous anesthesia was also reviewed. The risks                            and benefits of the procedure and the sedation                            options and risks were discussed with the patient.                            All questions were answered, and informed consent                            was obtained. Prior Anticoagulants: The patient has                            taken no previous anticoagulant or antiplatelet                            agents. ASA Grade Assessment: II - A patient with                            mild systemic disease. After reviewing the risks                            and benefits, the patient was deemed in  satisfactory condition to undergo the procedure.                           After obtaining informed consent, the colonoscope                            was passed under direct vision. Throughout the                            procedure, the patient's blood pressure, pulse, and                            oxygen saturations were monitored continuously. The                            Olympus CF-HQ190L [id number])  Colonoscope was                            introduced through the anus and advanced to the the                            terminal ileum. The colonoscopy was performed                            without difficulty. The patient tolerated the                            procedure well. The quality of the bowel                            preparation was good. The terminal ileum, ileocecal                            valve, appendiceal orifice, and rectum were                            photographed. Scope In: 8:49:51 AM Scope Out: 9:21:36 AM Scope Withdrawal Time: 0 hours 28 minutes 49 seconds  Total Procedure Duration: 0 hours 31 minutes 45 seconds  Findings:                 Hemorrhoids were found on perianal exam.                           Three sessile polyps were found in the transverse                            colon, ascending colon and cecum. The polyps were 5                            to 10 mm in size. These polyps were removed with a                            cold snare. Resection and retrieval were complete.  Estimated blood loss was minimal.                           A 30 mm polyp was found in the proximal transverse                            colon. The polyp was sessile. Biopsies were taken                            from the top of the polyp with a cold forceps for                            histology. Area on the contralateral wall was                            tattooed with an injection of 2 mL of Spot (carbon                            black). Estimated blood loss was minimal.                           A 8 mm polyp was found in the transverse colon. The                            polyp was flat. The polyp was removed with a cold                            snare. Resection and retrieval were complete.                            Estimated blood loss was minimal.                           Two flat polyps were found in the descending colon.                             The polyps were 8 to 12 mm in size. These polyps                            were removed with a cold snare. Resection and                            retrieval were complete. Estimated blood loss was                            minimal.                           Two sessile polyps were found in the sigmoid colon                            and descending colon. The polyps were 4 to 6 mm  in                            size. These polyps were removed with a cold snare.                            Resection and retrieval were complete. Estimated                            blood loss was minimal.                           Two sessile polyps were found in the rectum. The                            polyps were 2 to 3 mm in size. These polyps were                            removed with a cold snare. Resection and retrieval                            were complete. Estimated blood loss was minimal.                           A few small-mouthed diverticula were found in the                            sigmoid colon.                           Non-bleeding internal hemorrhoids were found during                            retroflexion. The hemorrhoids were small and Grade                            II (internal hemorrhoids that prolapse but reduce                            spontaneously).                           The terminal ileum appeared normal. Complications:            No immediate complications. Estimated Blood Loss:     Estimated blood loss was minimal. Impression:               - Hemorrhoids found on perianal exam.                           - Three 5 to 10 mm polyps in the transverse colon,                            in the ascending colon and in the cecum, removed  with a cold snare. Resected and retrieved.                           - One 30 mm polyp in the proximal transverse colon.                            This was biopsied in favor of either  Endoscopic                            Mucosal Resection at the hospital vs surgical                            resection depending on pathology results. The                            contralateral wall was then tattooed.                           - One 8 mm polyp in the transverse colon, removed                            with a cold snare. Resected and retrieved.                           - Two 8 to 12 mm polyps in the descending colon,                            removed with a cold snare. Resected and retrieved.                           - Two 4 to 6 mm polyps in the sigmoid colon and in                            the descending colon, removed with a cold snare.                            Resected and retrieved.                           - Two 2 to 3 mm polyps in the rectum, removed with                            a cold snare. Resected and retrieved.                           - Diverticulosis in the sigmoid colon.                           - Non-bleeding internal hemorrhoids.                           - The examined portion of the ileum was normal. Recommendation:           -  Patient has a contact number available for                            emergencies. The signs and symptoms of potential                            delayed complications were discussed with the                            patient. Return to normal activities tomorrow.                            Written discharge instructions were provided to the                            patient.                           - Resume previous diet.                           - Continue present medications.                           - Await pathology results.                           - Repeat colonoscopy for surveillance based on                            pathology results.                           - Return to GI office at appointment to be                            scheduled to discuss resection techniques of the                             larger polyp depending on pathology results.                           - Send to the lab for CEA level. Gerrit Heck, MD 10/17/2021 9:32:49 AM

## 2021-10-17 NOTE — Progress Notes (Signed)
VS taken by MO

## 2021-10-17 NOTE — Patient Instructions (Signed)
Impression/Recommendations:  Polyp, diverticulosis, and hemorrhoid handouts given to patient.  Resume previous diet. Continue present medications. Await pathology results.  Repeat colonoscopy for surveillance based on pathology results.  Return to GI office appointment to be schedule to discuss resection techniques of the larger polyp depending on pathology results.  CEA level in lab.  YOU HAD AN ENDOSCOPIC PROCEDURE TODAY AT [facility]:   Refer to the procedure report that was given to you for any specific questions about what was found during the examination.  If the procedure report does not answer your questions, please call your gastroenterologist to clarify.  If you requested that your care partner not be given the details of your procedure findings, then the procedure report has been included in a sealed envelope for you to review at your convenience later.  YOU SHOULD EXPECT: Some feelings of bloating in the abdomen. Passage of more gas than usual.  Walking can help get rid of the air that was put into your GI tract during the procedure and reduce the bloating. If you had a lower endoscopy (such as a colonoscopy or flexible sigmoidoscopy) you may notice spotting of blood in your stool or on the toilet paper. If you underwent a bowel prep for your procedure, you may not have a normal bowel movement for a few days.  Please Note:  You might notice some irritation and congestion in your nose or some drainage.  This is from the oxygen used during your procedure.  There is no need for concern and it should clear up in a day or so.  SYMPTOMS TO REPORT IMMEDIATELY:  Following lower endoscopy (colonoscopy or flexible sigmoidoscopy):  Excessive amounts of blood in the stool  Significant tenderness or worsening of abdominal pains  Swelling of the abdomen that is new, acute  Fever of 100F or higher  For urgent or emergent issues, a gastroenterologist can be reached at any  hour by calling [phone]. Do not use MyChart messaging for urgent concerns.    DIET:  We do recommend a small meal at first, but then you may proceed to your regular diet.  Drink plenty of fluids but you should avoid alcoholic beverages for 24 hours.  ACTIVITY:  You should plan to take it easy for the rest of today and you should NOT DRIVE or use heavy machinery until tomorrow (because of the sedation medicines used during the test).    FOLLOW UP: Our staff will call the number listed on your records 48-72 hours following your procedure to check on you and address any questions or concerns that you may have regarding the information given to you following your procedure. If we do not reach you, we will leave a message.  We will attempt to reach you two times.  During this call, we will ask if you have developed any symptoms of COVID 19. If you develop any symptoms (ie: fever, flu-like symptoms, shortness of breath, cough etc.) before then, please call [phone].  If you test positive for Covid 19 in the 2 weeks post procedure, please call and report this information to [place].    If any biopsies were taken you will be contacted by phone or by letter within the next 1-3 weeks.  Please call us at [phone] if you have not heard about the biopsies in 3 weeks.    SIGNATURES/CONFIDENTIALITY: You and/or your care partner have signed paperwork which will be entered into your electronic medical record.  These signatures  attest to the fact that that the information above on your After Visit Summary has been reviewed and is understood.  Full responsibility of the confidentiality of this discharge information lies with you and/or your care-partner.

## 2021-10-17 NOTE — Progress Notes (Signed)
GASTROENTEROLOGY PROCEDURE H&P NOTE   Primary Care Physician: Camillia Herter, NP    Reason for Procedure:  Colon Cancer screening, Positive COloguard  Plan:    Colonoscopy  Patient is appropriate for endoscopic procedure(s) in the ambulatory ([place]) setting.  The nature of the procedure, as well as the risks, benefits, and alternatives were carefully and thoroughly reviewed with the patient. Ample time for discussion and questions allowed. The patient understood, was satisfied, and agreed to proceed.     HPI: Gregg [name] is a 65 y.o. male who presents for colonoscopy for routine Colon Cancer screening and evaluation of positive Cologuard study in 08/2021.  No active GI symptoms.  No known family history of colon cancer or related malignancy.  Patient is otherwise without complaints or active issues today.  Past Medical History:  Diagnosis Date   Anxiety    Bilateral inguinal hernia 06/02/2021   GERD (gastroesophageal reflux disease)    History of kidney stones 2020   Hypertension    Sinus complaint 06/02/2021   x 1 week per pt   Wears glasses 06/02/2021    Past Surgical History:  Procedure Laterality Date   EXTRACORPOREAL SHOCK WAVE LITHOTRIPSY Left 02/23/2019   Procedure: EXTRACORPOREAL SHOCK WAVE LITHOTRIPSY (ESWL);  Surgeon: Bjorn Loser, MD;  Location: [facility];  Service: Urology;  Laterality: Left;   INGUINAL HERNIA REPAIR Bilateral 06/06/2021   Procedure: LAPAROSCOPIC BILATERAL INGUINAL HERNIA REPAIRS WITH MESH;  Surgeon: Michael Boston, MD;  Location: [place];  Service: General;  Laterality: Bilateral;  TAP BLOCK   LAPAROSCOPIC APPENDECTOMY N/A 03/07/2021   Procedure: APPENDECTOMY LAPAROSCOPIC;  Surgeon: Michael Boston, MD;  Location: [facility];  Service: General;  Laterality: N/A;    Prior to Admission medications   Medication Sig Start Date End Date Taking? Authorizing Provider  atorvastatin (LIPITOR) 40 MG tablet Take 1 tablet (40 mg total) by  mouth daily. 09/11/21 01/09/22 Yes Minette Brine, Amy J, NP  cetirizine (ZYRTEC) 10 MG tablet Take 10 mg by mouth daily.   Yes [provider]  losartan-hydrochlorothiazide (HYZAAR) 50-12.5 MG tablet Take 1 tablet by mouth daily. 08/08/21 11/06/21 Yes Camillia Herter, NP  Multiple Vitamins-Minerals (MULTI COMPLETE PO) Take by mouth.   Yes [provider]  sertraline (ZOLOFT) 25 MG tablet Take 1 tablet (25 mg total) by mouth daily. 08/20/21 11/18/21 Yes Minette Brine, Amy J, NP  acetaminophen (TYLENOL) 500 MG tablet Take 1,000 mg by mouth every 6 (six) hours as needed.    [provider]    Current Outpatient Medications  Medication Sig Dispense Refill   atorvastatin (LIPITOR) 40 MG tablet Take 1 tablet (40 mg total) by mouth daily. 120 tablet 0   cetirizine (ZYRTEC) 10 MG tablet Take 10 mg by mouth daily.     losartan-hydrochlorothiazide (HYZAAR) 50-12.5 MG tablet Take 1 tablet by mouth daily. 90 tablet 0   Multiple Vitamins-Minerals (MULTI COMPLETE PO) Take by mouth.     sertraline (ZOLOFT) 25 MG tablet Take 1 tablet (25 mg total) by mouth daily. 90 tablet 0   acetaminophen (TYLENOL) 500 MG tablet Take 1,000 mg by mouth every 6 (six) hours as needed.     Current Facility-Administered Medications  Medication Dose Route Frequency Provider Last Rate Last Admin   0.9 %  sodium chloride infusion  500 mL Intravenous Once Kong Packett V, DO        Allergies as of 10/17/2021   (No Known Allergies)    Family History  Problem Relation Age  of Onset   Stomach cancer Neg Hx    Colon cancer Neg Hx    Esophageal cancer Neg Hx    Rectal cancer Neg Hx     Social History   Socioeconomic History   Marital status: Single    Spouse name: Not on file   Number of children: Not on file   Years of education: Not on file   Highest education level: Not on file  Occupational History   Not on file  Tobacco Use   Smoking status: Former    Packs/day: 0.50    Years: 40.00    Pack  years: 20.00    Types: Cigarettes    Quit date: 03/29/2017    Years since quitting: 4.5   Smokeless tobacco: Never  Vaping Use   Vaping Use: Never used  Substance and Sexual Activity   Alcohol use: Yes    Comment: rare   Drug use: Never   Sexual activity: Not on file  Other Topics Concern   Not on file  Social History Narrative   Not on file   Social Determinants of Health   Financial Resource Strain: Not on file  Food Insecurity: Not on file  Transportation Needs: Not on file  Physical Activity: Not on file  Stress: Not on file  Social Connections: Not on file  Intimate Partner Violence: Not on file    Physical Exam: Vital signs in last 24 hours: @BP  (!) 144/84   Pulse 100   Temp 97.8 F (36.6 C)   Resp 17   Ht 5\' 9"  (1.753 m)   Wt 161 lb (73 kg)   SpO2 96%   BMI 23.78 kg/m  GEN: NAD EYE: Sclerae anicteric ENT: MMM CV: Non-tachycardic Pulm: CTA b/l GI: Soft, NT/ND NEURO:  Alert & Oriented x 3   Gerrit Heck, DO [facility]   10/17/2021 8:43 AM

## 2021-10-18 LAB — CEA: CEA: 2 ng/mL

## 2021-10-20 ENCOUNTER — Telehealth: Payer: Self-pay

## 2021-10-20 NOTE — Telephone Encounter (Signed)
Per 10/17/21 procedure report - Return to GI office at appt to be scheduled to discuss resection techniques of the larger polyp depending upon pathology results  Called and spoke with pt to schedule follow up with Dr. Bryan Lemma. Pt has been scheduled for a follow up on Tuesday, 11/25/21 at 9:20 am. Pt is aware that this appt is at the [facility] office. Pt verbalized understanding and had no concern at the end of the call.

## 2021-10-21 ENCOUNTER — Telehealth: Payer: Self-pay

## 2021-10-21 ENCOUNTER — Other Ambulatory Visit: Payer: Self-pay

## 2021-10-21 ENCOUNTER — Other Ambulatory Visit: Payer: BC Managed Care – PPO

## 2021-10-21 DIAGNOSIS — Z1329 Encounter for screening for other suspected endocrine disorder: Secondary | ICD-10-CM

## 2021-10-21 DIAGNOSIS — E785 Hyperlipidemia, unspecified: Secondary | ICD-10-CM

## 2021-10-21 NOTE — Telephone Encounter (Signed)
  Follow up Call-  Call back number 10/17/2021  Post procedure Call Back phone  # [phone]  Permission to leave phone message Yes     Patient questions:  Do you have a fever, pain , or abdominal swelling? No. Pain Score  0 *  Have you tolerated food without any problems? Yes.    Have you been able to return to your normal activities? Yes.    Do you have any questions about your discharge instructions: Diet   No. Medications  No. Follow up visit  No.  Do you have questions or concerns about your Care? No.  Actions: * If pain score is 4 or above: No action needed, pain <4.

## 2021-10-22 ENCOUNTER — Other Ambulatory Visit: Payer: Self-pay | Admitting: Family

## 2021-10-22 DIAGNOSIS — E0590 Thyrotoxicosis, unspecified without thyrotoxic crisis or storm: Secondary | ICD-10-CM

## 2021-10-22 LAB — LIPID PANEL
Chol/HDL Ratio: 4.2 ratio (ref 0.0–5.0)
Cholesterol, Total: 138 mg/dL (ref 100–199)
HDL: 33 mg/dL — ABNORMAL LOW (ref >39)
LDL Chol Calc (NIH): 80 mg/dL (ref 0–99)
Triglycerides: 139 mg/dL (ref 0–149)
VLDL Cholesterol Cal: 25 mg/dL (ref 5–40)

## 2021-10-22 LAB — TSH: TSH: 0.369 u[IU]/mL — ABNORMAL LOW (ref 0.450–4.500)

## 2021-10-22 NOTE — Progress Notes (Signed)
Thyroid function remaining lower than normal. Referral to Endocrinology. Their office should call patient within 2 weeks with appointment details.   Cholesterol improved since 4 weeks ago. Continue Atorvastatin (Lipitor) for cholesterol maintenance.

## 2021-10-29 ENCOUNTER — Encounter: Payer: Self-pay | Admitting: Gastroenterology

## 2021-10-30 ENCOUNTER — Telehealth: Payer: Self-pay | Admitting: General Surgery

## 2021-10-30 DIAGNOSIS — D369 Benign neoplasm, unspecified site: Secondary | ICD-10-CM

## 2021-10-30 DIAGNOSIS — D123 Benign neoplasm of transverse colon: Secondary | ICD-10-CM

## 2021-10-30 NOTE — Telephone Encounter (Signed)
Scheduled the patient for our 1st available at [facility] which is 12/31/2021 at 1145,

## 2021-10-30 NOTE — Telephone Encounter (Signed)
-----   Message from [place], DO sent at 10/29/2021  5:11 PM EST ----- The large polyp that was biopsied in the transverse colon returned as a Tubular Adenoma.  This is considered a benign, but precancerous lesion and needs to be resected.  The remainder of the polyps were a mixture of Tubular Adenomas, Sessile Serrated Polyps, and Hyperplastic Polyps.  Plan for follow-up in the GI clinic as already scheduled to discuss resection techniques.  In the meantime, please work on securing potential time for colonoscopy with EMR at [facility] with me.  No separate path recall letter needed.  Will determine optimal timing for repeat colonoscopy for surveillance after repeat colonoscopy.

## 2021-10-31 ENCOUNTER — Telehealth: Payer: BC Managed Care – PPO | Admitting: Nurse Practitioner

## 2021-10-31 DIAGNOSIS — J208 Acute bronchitis due to other specified organisms: Secondary | ICD-10-CM | POA: Diagnosis not present

## 2021-11-01 MED ORDER — FLUTICASONE PROPIONATE 50 MCG/ACT NA SUSP: 2.0000 | Freq: Every day | NASAL | 6 refills | Status: DC

## 2021-11-01 MED ORDER — PREDNISONE 10 MG PO TABS: 10.0000 mg | ORAL_TABLET | Freq: Every day | ORAL | 0 refills | Status: AC

## 2021-11-01 NOTE — Progress Notes (Signed)
I have spent 5 minutes in review of e-visit questionnaire, review and updating patient chart, medical decision making and response to patient.  ° °Sophia Sperry W Kacy Conely, NP ° °  °

## 2021-11-01 NOTE — Progress Notes (Signed)
We are sorry that you are not feeling well.  Here is how we plan to help!  Based on your presentation I believe you most likely have A cough due to a virus.  This is called viral bronchitis and is best treated by rest, plenty of fluids and control of the cough.  You may use Ibuprofen or Tylenol as directed to help your symptoms.     In addition you may use a fluticasone nose spray for nasal congestion.  Prednisone 10 mg daily for 6 days    From your responses in the eVisit questionnaire you describe inflammation in the upper respiratory tract which is causing a significant cough.  This is commonly called Bronchitis and has four common causes:   Allergies Viral Infections Acid Reflux Bacterial Infection Allergies, viruses and acid reflux are treated by controlling symptoms or eliminating the cause. An example might be a cough caused by taking certain blood pressure medications. You stop the cough by changing the medication. Another example might be a cough caused by acid reflux. Controlling the reflux helps control the cough.  USE OF BRONCHODILATOR ("RESCUE") INHALERS: There is a risk from using your bronchodilator too frequently.  The risk is that over-reliance on a medication which only relaxes the muscles surrounding the breathing tubes can reduce the effectiveness of medications prescribed to reduce swelling and congestion of the tubes themselves.  Although you feel brief relief from the bronchodilator inhaler, your asthma may actually be worsening with the tubes becoming more swollen and filled with mucus.  This can delay other crucial treatments, such as oral steroid medications. If you need to use a bronchodilator inhaler daily, several times per day, you should discuss this with your provider.  There are probably better treatments that could be used to keep your asthma under control.     HOME CARE Only take medications as instructed by your medical team. Complete the entire course of an  antibiotic. Drink plenty of fluids and get plenty of rest. Avoid close contacts especially the very young and the elderly Cover your mouth if you cough or cough into your sleeve. Always remember to wash your hands A steam or ultrasonic humidifier can help congestion.   GET HELP RIGHT AWAY IF: You develop worsening fever. You become short of breath You cough up blood. Your symptoms persist after you have completed your treatment plan MAKE SURE YOU  Understand these instructions. Will watch your condition. Will get help right away if you are not doing well or get worse.    Thank you for choosing an e-visit.  Your e-visit answers were reviewed by a board certified advanced clinical practitioner to complete your personal care plan. Depending upon the condition, your plan could have included both over the counter or prescription medications.  Please review your pharmacy choice. Make sure the pharmacy is open so you can pick up prescription now. If there is a problem, you may contact your provider through [employer] and have the prescription routed to another pharmacy.  Your safety is important to [place]. If you have drug allergies check your prescription carefully.   For the next 24 hours you can use MyChart to ask questions about today's visit, request a non-urgent call back, or ask for a work or school excuse. You will get an email in the next two days asking about your experience. I hope that your e-visit has been valuable and will speed your recovery.

## 2021-11-03 ENCOUNTER — Telehealth: Payer: Self-pay | Admitting: General Surgery

## 2021-11-03 NOTE — Telephone Encounter (Signed)
Contated Gregg [name] to advise him we have scheduled him for his procedure 12/31/2021 at [facility] hospital. The patient is coming to the clinic on 11/25/2021 and would like to wait until that day to go over his instructions. Meanwhile they have been typed and sent via mychart.

## 2021-11-03 NOTE — Telephone Encounter (Signed)
-----   Message from [place], DO sent at 10/30/2021  6:34 PM EST ----- Lets keep him on for that day and block off my 1:20 and 1:40 so that we arent rushing. You are right. Can take 90 minutes sometimes.   Thanks!  ----- Message ----- From: Letta Pate, CMA Sent: 10/30/2021   4:49 PM EST To: Lavena Bullion, DO  I scheduled this patient at the only available [facility] time you have which is 01/01/2022 at 11:45, I know these cases take a long time and you are to have clinic that afternoon. We have only one patient on at 2:00 that day. We just have absolutely no hospital time to fit him in except that day or he will need to wait until February. Let me know what you think? ----- Message ----- From: Lavena Bullion, DO Sent: 10/29/2021   5:11 PM EST To: Samantha Crimes, RN, Letta Pate, CMA  The large polyp that was biopsied in the transverse colon returned as a Tubular Adenoma.  This is considered a benign, but precancerous lesion and needs to be resected.  The remainder of the polyps were a mixture of Tubular Adenomas, Sessile Serrated Polyps, and Hyperplastic Polyps.  Plan for follow-up in the GI clinic as already scheduled to discuss resection techniques.  In the meantime, please work on securing potential time for colonoscopy with EMR at [facility] with me.  No separate path recall letter needed.  Will determine optimal timing for repeat colonoscopy for surveillance after repeat colonoscopy.

## 2021-11-15 ENCOUNTER — Other Ambulatory Visit: Payer: Self-pay | Admitting: Family

## 2021-11-15 DIAGNOSIS — I10 Essential (primary) hypertension: Secondary | ICD-10-CM

## 2021-11-15 DIAGNOSIS — F419 Anxiety disorder, unspecified: Secondary | ICD-10-CM

## 2021-11-15 DIAGNOSIS — F32A Depression, unspecified: Secondary | ICD-10-CM

## 2021-11-17 ENCOUNTER — Other Ambulatory Visit: Payer: Self-pay | Admitting: Family

## 2021-11-17 DIAGNOSIS — F419 Anxiety disorder, unspecified: Secondary | ICD-10-CM

## 2021-11-17 DIAGNOSIS — I10 Essential (primary) hypertension: Secondary | ICD-10-CM

## 2021-11-20 ENCOUNTER — Other Ambulatory Visit: Payer: Self-pay

## 2021-11-20 ENCOUNTER — Telehealth: Payer: Self-pay | Admitting: Family

## 2021-11-20 ENCOUNTER — Encounter: Payer: Self-pay | Admitting: Family

## 2021-11-20 DIAGNOSIS — F32A Depression, unspecified: Secondary | ICD-10-CM

## 2021-11-20 DIAGNOSIS — F419 Anxiety disorder, unspecified: Secondary | ICD-10-CM

## 2021-11-20 DIAGNOSIS — I10 Essential (primary) hypertension: Secondary | ICD-10-CM

## 2021-11-20 MED ORDER — LOSARTAN POTASSIUM-HCTZ 50-12.5 MG PO TABS: 1.0000 | ORAL_TABLET | Freq: Every day | ORAL | 0 refills | Status: DC

## 2021-11-20 MED ORDER — SERTRALINE HCL 25 MG PO TABS: 25.0000 mg | ORAL_TABLET | Freq: Every day | ORAL | 0 refills | Status: DC

## 2021-11-20 NOTE — Telephone Encounter (Signed)
Pt calling regarding 2 meds states Pharmacy said they have not gotten the authorization for refills:    losartan-hydrochlorothiazide (HYZAAR) 50-12.5 MG tablet [[id number]] sertraline (ZOLOFT) 25 MG tablet [[id number]]  Pharmacy  [id number] - [place],  - [address]  [address], [place] [address]  Phone:  [phone]  Fax:  [phone]    Please advise and thank you

## 2021-11-20 NOTE — Progress Notes (Signed)
Per patient request Sertraline and Losartan HCTZ, refilled for courtesy 30 day supply. Please schedule appointment for additional refills

## 2021-11-22 NOTE — Progress Notes (Signed)
Patient ID: Gregg [name], male    DOB: 07/19/1956  MRN: [id number]  CC: Hypertension Follow-Up   Subjective: Gregg [name] is a 65 y.o. male who presents for hypertension follow-up.   His concerns today include:  HYPERTENSION FOLLOW-UP: 08/20/2021: - Continue Losartan-Hydrochlorothiazide as prescribed.   11/25/2021: Doing well on current regimen, no issues/concerns. Home blood pressures about the same as today.  2. HYPERLIPIDEMIA FOLLOW-UP: Doing well on current regimen, no issues/concerns.   3. ANXIETY DEPRESSION FOLLOW-UP: 08/20/2021: - Continue Sertraline as prescribed.   11/25/2021: Doing well on current regimen, no issues/concerns.   Depression screen [facility] 2/9 11/25/2021 09/09/2021 08/08/2021 07/22/2021 07/02/2021  Decreased Interest 0 0 0 0 0  Down, Depressed, Hopeless 0 0 0 0 0  PHQ - 2 Score 0 0 0 0 0  Altered sleeping - - 1 1 0  Tired, decreased energy - - 0 1 0  Change in appetite - - 0 1 0  Feeling bad or failure about yourself  - - 0 0 0  Trouble concentrating - - 0 0 0  Moving slowly or fidgety/restless - - 0 0 0  Suicidal thoughts - - 0 0 0  PHQ-9 Score - - 1 3 0  Difficult doing work/chores - Not difficult at all Not difficult at all Not difficult at all Not difficult at all     Patient Active Problem List   Diagnosis Date Noted   Hyperlipidemia 09/11/2021   Essential hypertension 07/22/2021   Anxiety and depression 07/02/2021   History of appendicitis 07/01/2021   Acute phlegmonous appendicitis s/p lap appendectomy 03/07/2021 03/07/2021   Kidney stone 03/07/2021   Bilateral inguinal hernia (BIH) s/p lap repair with mesh 06/06/2021 03/07/2021     Current Outpatient Medications on File Prior to Visit  Medication Sig Dispense Refill   acetaminophen (TYLENOL) 500 MG tablet Take 1,000 mg by mouth every 6 (six) hours as needed.     cetirizine (ZYRTEC) 10 MG tablet Take 10 mg by mouth daily.     fluticasone (FLONASE) 50 MCG/ACT nasal spray Place 2 sprays into both  nostrils daily. (Patient taking differently: Place 2 sprays into both nostrils as needed.) 16 g 6   losartan-hydrochlorothiazide (HYZAAR) 50-12.5 MG tablet Take 1 tablet by mouth daily. 90 tablet 0   Multiple Vitamins-Minerals (MULTI COMPLETE PO) Take by mouth.     sertraline (ZOLOFT) 25 MG tablet Take 1 tablet (25 mg total) by mouth daily. 90 tablet 0   No current facility-administered medications on file prior to visit.    No Known Allergies  Social History   Socioeconomic History   Marital status: Single    Spouse name: Not on file   Number of children: Not on file   Years of education: Not on file   Highest education level: Not on file  Occupational History   Not on file  Tobacco Use   Smoking status: Former    Packs/day: 0.50    Years: 40.00    Pack years: 20.00    Types: Cigarettes    Quit date: 03/29/2017    Years since quitting: 4.6   Smokeless tobacco: Never  Vaping Use   Vaping Use: Never used  Substance and Sexual Activity   Alcohol use: Yes    Comment: rare   Drug use: Never   Sexual activity: Not on file  Other Topics Concern   Not on file  Social History Narrative   Not on file   Social Determinants of Health  Financial Resource Strain: Not on file  Food Insecurity: Not on file  Transportation Needs: Not on file  Physical Activity: Not on file  Stress: Not on file  Social Connections: Not on file  Intimate Partner Violence: Not on file    Family History  Problem Relation Age of Onset   Stomach cancer Neg Hx    Colon cancer Neg Hx    Esophageal cancer Neg Hx    Rectal cancer Neg Hx     Past Surgical History:  Procedure Laterality Date   EXTRACORPOREAL SHOCK WAVE LITHOTRIPSY Left 02/23/2019   Procedure: EXTRACORPOREAL SHOCK WAVE LITHOTRIPSY (ESWL);  Surgeon: Bjorn Loser, MD;  Location: [facility];  Service: Urology;  Laterality: Left;   INGUINAL HERNIA REPAIR Bilateral 06/06/2021   Procedure: LAPAROSCOPIC BILATERAL INGUINAL HERNIA REPAIRS  WITH MESH;  Surgeon: Michael Boston, MD;  Location: [place];  Service: General;  Laterality: Bilateral;  TAP BLOCK   LAPAROSCOPIC APPENDECTOMY N/A 03/07/2021   Procedure: APPENDECTOMY LAPAROSCOPIC;  Surgeon: Michael Boston, MD;  Location: [facility];  Service: General;  Laterality: N/A;    ROS: Review of Systems Negative except as stated above  PHYSICAL EXAM: BP 123/74 (BP Location: Left Arm, Patient Position: Sitting, Cuff Size: Normal)   Pulse 95   Temp 98.3 F (36.8 C)   Resp 16   Ht 5' 9.76" (1.772 m)   Wt 167 lb (75.8 kg)   SpO2 92%   BMI 24.12 kg/m   Physical Exam HENT:     Head: Normocephalic and atraumatic.  Eyes:     Extraocular Movements: Extraocular movements intact.     Conjunctiva/sclera: Conjunctivae normal.     Pupils: Pupils are equal, round, and reactive to light.  Cardiovascular:     Rate and Rhythm: Normal rate and regular rhythm.     Pulses: Normal pulses.     Heart sounds: Normal heart sounds.  Pulmonary:     Effort: Pulmonary effort is normal.     Breath sounds: Normal breath sounds.  Musculoskeletal:     Cervical back: Normal range of motion and neck supple.  Neurological:     General: No focal deficit present.     Mental Status: He is alert and oriented to person, place, and time.  Psychiatric:        Mood and Affect: Mood normal.        Behavior: Behavior normal.   ASSESSMENT AND PLAN: 1. Essential hypertension: - Continue  Losartan-Hydrochlorothiazide 50-12.5 mg daily as prescribed. No refills needed as of present, most recent refill 11/20/2021 for 90 day supply.  - Counseled on blood pressure goal of less than 130/80, low-sodium, DASH diet, medication compliance, 150 minutes of moderate intensity exercise per week as tolerated. Discussed medication compliance, adverse effects. - Follow-up with primary provider in 4 months or sooner if needed.   2. Anxiety and depression: - Patient denies thoughts of self-harm, suicidal  ideations, homicidal ideations. - Continue Sertraline 25 mg daily as prescribed. No refills needed as of present, most recent refill 11/20/2021 for 90 day supply.  - Follow-up with primary provider in 4 months or sooner if needed.   3. Hyperlipidemia, unspecified hyperlipidemia type: - Practice low-fat heart healthy diet and at least 150 minutes of moderate intensity exercise weekly as tolerated.  - Continue Atorvastatin as prescribed.  - Follow-up with primary provider in 4 months or sooner if needed.  - atorvastatin (LIPITOR) 40 MG tablet; Take 1 tablet (40 mg total) by mouth daily.  Dispense: 120  tablet; Refill: 0    Patient was given the opportunity to ask questions.  Patient verbalized understanding of the plan and was able to repeat key elements of the plan. Patient was given clear instructions to go to Emergency Department or return to medical center if symptoms don't improve, worsen, or new problems develop.The patient verbalized understanding.  Requested Prescriptions   Signed Prescriptions Disp Refills   atorvastatin (LIPITOR) 40 MG tablet 120 tablet 0    Sig: Take 1 tablet (40 mg total) by mouth daily.    Return in about 4 months (around 03/26/2022) for Follow-Up or next available hypertension .  Camillia Herter, NP

## 2021-11-25 ENCOUNTER — Encounter: Payer: Self-pay | Admitting: Family

## 2021-11-25 ENCOUNTER — Ambulatory Visit (INDEPENDENT_AMBULATORY_CARE_PROVIDER_SITE_OTHER): Payer: BC Managed Care – PPO | Admitting: Family

## 2021-11-25 ENCOUNTER — Other Ambulatory Visit: Payer: Self-pay

## 2021-11-25 ENCOUNTER — Ambulatory Visit (INDEPENDENT_AMBULATORY_CARE_PROVIDER_SITE_OTHER): Payer: BC Managed Care – PPO | Admitting: Gastroenterology

## 2021-11-25 ENCOUNTER — Encounter: Payer: Self-pay | Admitting: Gastroenterology

## 2021-11-25 VITALS — BP 123/74 | HR 95 | Temp 98.3°F | Resp 16 | Ht 69.76 in | Wt 167.0 lb

## 2021-11-25 VITALS — BP 130/72 | HR 105 | Ht 69.0 in | Wt 167.0 lb

## 2021-11-25 DIAGNOSIS — F32A Depression, unspecified: Secondary | ICD-10-CM | POA: Diagnosis not present

## 2021-11-25 DIAGNOSIS — Z86010 Personal history of colonic polyps: Secondary | ICD-10-CM

## 2021-11-25 DIAGNOSIS — K640 First degree hemorrhoids: Secondary | ICD-10-CM

## 2021-11-25 DIAGNOSIS — D126 Benign neoplasm of colon, unspecified: Secondary | ICD-10-CM

## 2021-11-25 DIAGNOSIS — E785 Hyperlipidemia, unspecified: Secondary | ICD-10-CM

## 2021-11-25 DIAGNOSIS — F419 Anxiety disorder, unspecified: Secondary | ICD-10-CM | POA: Diagnosis not present

## 2021-11-25 DIAGNOSIS — I10 Essential (primary) hypertension: Secondary | ICD-10-CM

## 2021-11-25 DIAGNOSIS — K5730 Diverticulosis of large intestine without perforation or abscess without bleeding: Secondary | ICD-10-CM | POA: Diagnosis not present

## 2021-11-25 MED ORDER — ATORVASTATIN CALCIUM 40 MG PO TABS: 40.0000 mg | ORAL_TABLET | Freq: Every day | ORAL | 0 refills | Status: DC

## 2021-11-25 NOTE — Progress Notes (Signed)
Chief Complaint:    Post procedure follow-up, colon polyp management  GI History: 65 year old male with index screening colonoscopy in 09/2021 after positive Cologuard which was notable for large polyp as outlined below.  Otherwise no GI symptoms and no known family history of colon cancer or related malignancies. - 08/2021: Cologuard positive.  Normal CBC and liver enzymes - 10/17/2021: Colonoscopy: 3 polyps ranging 5-10 mm (path:), 30 mm polyp in proximal transverse colon biopsied and tattooed (path:), 8 mm flat transverse polyp (path:), 2 flat polyps ranging 8-12 mm in descending colon (path:), 2 subcentimeter sigmoid/descending polyps (path:), 2 small rectal polyps (path:).  Sigmoid diverticulosis, internal hemorrhoids.  Normal TI -10/20/122: CEA Normal   Endoscopic History: - Colonoscopy (10/17/2021): 3 polyps ranging 5-10 mm (path: Tubular adenoma, SSP), 30 mm polyp in proximal transverse colon biopsied and tattooed (path: Tubular adenoma), 8 mm flat transverse polyp (path: SSP), 2 flat polyps ranging 8-12 mm in descending colon (path: HP), 2 subcentimeter sigmoid/descending polyps (path: TA x1, HP x1), 2 small rectal polyps (path: HP).  Sigmoid diverticulosis, internal hemorrhoids.  Normal TI  HPI:     Patient is a 65 y.o. male presenting to the Gastroenterology Clinic for follow-up after colonoscopy in 09/2021 as outlined above.  Scheduled for repeat colonoscopy at [facility] for EMR next month (12/30/2021).  Today, he states he is o/w w/o any complaints.    Review of systems:     No chest pain, no SOB, no fevers, no urinary sx   Past Medical History:  Diagnosis Date   Anxiety    Bilateral inguinal hernia 06/02/2021   GERD (gastroesophageal reflux disease)    History of kidney stones 2020   Hypertension    Sinus complaint 06/02/2021   x 1 week per pt   Wears glasses 06/02/2021    Patient's surgical history, family medical history, social history, medications and  allergies were all reviewed in Epic    Current Outpatient Medications  Medication Sig Dispense Refill   acetaminophen (TYLENOL) 500 MG tablet Take 1,000 mg by mouth every 6 (six) hours as needed.     atorvastatin (LIPITOR) 40 MG tablet Take 1 tablet (40 mg total) by mouth daily. 120 tablet 0   cetirizine (ZYRTEC) 10 MG tablet Take 10 mg by mouth daily.     fluticasone (FLONASE) 50 MCG/ACT nasal spray Place 2 sprays into both nostrils daily. (Patient taking differently: Place 2 sprays into both nostrils as needed.) 16 g 6   losartan-hydrochlorothiazide (HYZAAR) 50-12.5 MG tablet Take 1 tablet by mouth daily. 90 tablet 0   Multiple Vitamins-Minerals (MULTI COMPLETE PO) Take by mouth.     sertraline (ZOLOFT) 25 MG tablet Take 1 tablet (25 mg total) by mouth daily. 90 tablet 0   No current facility-administered medications for this visit.    Physical Exam:     BP 130/72   Pulse (!) 105   Ht 5\' 9"  (1.753 m)   Wt 167 lb (75.8 kg)   BMI 24.66 kg/m   GENERAL:  Pleasant male in NAD PSYCH: : Cooperative, normal affect Musculoskeletal:  Normal muscle tone, normal strength NEURO: Alert and oriented x 3, no focal neurologic deficits   IMPRESSION and PLAN:    1) History of Tubular Adenoma - Plan for colonoscopy at [facility] for Endoscopic Mucosal Resection of known large transverse colon Tubular Adenoma - Discussed EMR techniques at length today.  Discussed the risks, benefits, alternatives of colonoscopy with EMR, to include surgical resection.  Discussed the  increased postoperative bleeding and perforation risks with EMR as compared to colonoscopy.  Discussed the possibility of incomplete resection that could then require surgical resection and he wishes to proceed as scheduled - Interval for next colonoscopy for continued surveillance pending resection  2) Diverticulosis 3) Internal hemorrhoids - Asymptomatic      [place] ,DO, FACG 11/25/2021, 9:23 AM

## 2021-11-25 NOTE — Progress Notes (Signed)
Pt presents for hypertension follow-up, rec'd 30 day refill on Losartan next refill pt request 90 day supply this is the [employer]	[place] will pay for

## 2021-11-25 NOTE — Patient Instructions (Signed)
If you are age 63 or older, your body mass index should be between 23-30. Your Body mass index is 24.66 kg/m. If this is out of the aforementioned range listed, please consider follow up with your Primary Care Provider.  If you are age 64 or younger, your body mass index should be between 19-25. Your Body mass index is 24.66 kg/m. If this is out of the aformentioned range listed, please consider follow up with your Primary Care Provider.   __________________________________________________________  The [facility] providers would like to encourage you to use [facility] to communicate with providers for non-urgent requests or questions.  Due to long hold times on the telephone, sending your provider a message by [facility] may be a faster and more efficient way to get a response.  Please allow 48 business hours for a response.  Please remember that this is for non-urgent requests.   You are scheduled for your procedure at [facility] on 12/31/2021. We have given you a prep of Clenpiq for your procedure.  Due to recent changes in healthcare laws, you may see the results of your imaging and laboratory studies on MyChart before your provider has had a chance to review them.  We understand that in some cases there may be results that are confusing or concerning to you. Not all laboratory results come back in the same time frame and the provider may be waiting for multiple results in order to interpret others.  Please give [place] 48 hours in order for your provider to thoroughly review all the results before contacting the office for clarification of your results.   Thank you for choosing me and [facility].  Vito Cirigliano, D.O.

## 2021-11-29 ENCOUNTER — Telehealth: Payer: BC Managed Care – PPO | Admitting: Nurse Practitioner

## 2021-11-29 DIAGNOSIS — J208 Acute bronchitis due to other specified organisms: Secondary | ICD-10-CM

## 2021-11-29 MED ORDER — PSEUDOEPH-BROMPHEN-DM 30-2-10 MG/5ML PO SYRP: 5.0000 mL | ORAL_SOLUTION | Freq: Four times a day (QID) | ORAL | 0 refills | Status: DC | PRN

## 2021-11-29 MED ORDER — PREDNISONE 10 MG PO TABS: 10.0000 mg | ORAL_TABLET | Freq: Every day | ORAL | 0 refills | Status: DC

## 2021-11-29 NOTE — Progress Notes (Signed)
We are sorry that you are not feeling well.  Here is how we plan to help!  Based on your presentation I believe you most likely have A cough due to a virus.  This is called viral bronchitis and is best treated by rest, plenty of fluids and control of the cough.  You may use Ibuprofen or Tylenol as directed to help your symptoms.     In addition you may use THE PRESCRIPTION COUGH SYRUP I HAVE SENT AS WELL, FLONASE SPRAY AND THE FOLLOWING:   Prednisone 10 mg daily for 6 days    From your responses in the eVisit questionnaire you describe inflammation in the upper respiratory tract which is causing a significant cough.  This is commonly called Bronchitis and has four common causes:   Allergies Viral Infections Acid Reflux Bacterial Infection Allergies, viruses and acid reflux are treated by controlling symptoms or eliminating the cause. An example might be a cough caused by taking certain blood pressure medications. You stop the cough by changing the medication. Another example might be a cough caused by acid reflux. Controlling the reflux helps control the cough.  USE OF BRONCHODILATOR ("RESCUE") INHALERS: There is a risk from using your bronchodilator too frequently.  The risk is that over-reliance on a medication which only relaxes the muscles surrounding the breathing tubes can reduce the effectiveness of medications prescribed to reduce swelling and congestion of the tubes themselves.  Although you feel brief relief from the bronchodilator inhaler, your asthma may actually be worsening with the tubes becoming more swollen and filled with mucus.  This can delay other crucial treatments, such as oral steroid medications. If you need to use a bronchodilator inhaler daily, several times per day, you should discuss this with your provider.  There are probably better treatments that could be used to keep your asthma under control.     HOME CARE Only take medications as instructed by your medical  team. Complete the entire course of an antibiotic. Drink plenty of fluids and get plenty of rest. Avoid close contacts especially the very young and the elderly Cover your mouth if you cough or cough into your sleeve. Always remember to wash your hands A steam or ultrasonic humidifier can help congestion.   GET HELP RIGHT AWAY IF: You develop worsening fever. You become short of breath You cough up blood. Your symptoms persist after you have completed your treatment plan MAKE SURE YOU  Understand these instructions. Will watch your condition. Will get help right away if you are not doing well or get worse.    Thank you for choosing an e-visit.  Your e-visit answers were reviewed by a board certified advanced clinical practitioner to complete your personal care plan. Depending upon the condition, your plan could have included both over the counter or prescription medications.  Please review your pharmacy choice. Make sure the pharmacy is open so you can pick up prescription now. If there is a problem, you may contact your provider through [employer] and have the prescription routed to another pharmacy.  Your safety is important to [place]. If you have drug allergies check your prescription carefully.   For the next 24 hours you can use MyChart to ask questions about today's visit, request a non-urgent call back, or ask for a work or school excuse. You will get an email in the next two days asking about your experience. I hope that your e-visit has been valuable and will speed your recovery.

## 2021-11-29 NOTE — Progress Notes (Signed)
I have spent 5 minutes in review of e-visit questionnaire, review and updating patient chart, medical decision making and response to patient.  ° °Gregg [name] W Gregg Lingle, NP ° °  °

## 2021-12-01 ENCOUNTER — Encounter: Payer: Self-pay | Admitting: Endocrinology

## 2021-12-01 ENCOUNTER — Other Ambulatory Visit: Payer: Self-pay

## 2021-12-01 ENCOUNTER — Ambulatory Visit (INDEPENDENT_AMBULATORY_CARE_PROVIDER_SITE_OTHER): Payer: BC Managed Care – PPO | Admitting: Endocrinology

## 2021-12-01 DIAGNOSIS — E042 Nontoxic multinodular goiter: Secondary | ICD-10-CM | POA: Insufficient documentation

## 2021-12-01 MED ORDER — METHIMAZOLE 5 MG PO TABS: 2.5000 mg | ORAL_TABLET | ORAL | 1 refills | Status: DC

## 2021-12-01 NOTE — Progress Notes (Signed)
Subjective:    Patient ID: Gregg [name], male    DOB: September 06, 1956, 65 y.o.   MRN: [id number]  HPI Pt is referred by Durene Fruits, NP, for hyperthyroidism.  Pt reports he was dx'ed with hyperthyroidism in 2022.  He has never been on therapy for this.  He has never had XRT to the anterior neck, or thyroid surgery.  He has never had thyroid imaging.  He does not consume kelp or any other non-prescribed thyroid medication.  He has never been on amiodarone.   Past Medical History:  Diagnosis Date   Anxiety    Bilateral inguinal hernia 06/02/2021   GERD (gastroesophageal reflux disease)    History of kidney stones 2020   Hypertension    Sinus complaint 06/02/2021   x 1 week per pt   Wears glasses 06/02/2021    Past Surgical History:  Procedure Laterality Date   EXTRACORPOREAL SHOCK WAVE LITHOTRIPSY Left 02/23/2019   Procedure: EXTRACORPOREAL SHOCK WAVE LITHOTRIPSY (ESWL);  Surgeon: Bjorn Loser, MD;  Location: [facility];  Service: Urology;  Laterality: Left;   INGUINAL HERNIA REPAIR Bilateral 06/06/2021   Procedure: LAPAROSCOPIC BILATERAL INGUINAL HERNIA REPAIRS WITH MESH;  Surgeon: Michael Boston, MD;  Location: [place];  Service: General;  Laterality: Bilateral;  TAP BLOCK   LAPAROSCOPIC APPENDECTOMY N/A 03/07/2021   Procedure: APPENDECTOMY LAPAROSCOPIC;  Surgeon: Michael Boston, MD;  Location: [facility];  Service: General;  Laterality: N/A;    Social History   Socioeconomic History   Marital status: Single    Spouse name: Not on file   Number of children: Not on file   Years of education: Not on file   Highest education level: Not on file  Occupational History   Not on file  Tobacco Use   Smoking status: Former    Packs/day: 0.50    Years: 40.00    Pack years: 20.00    Types: Cigarettes    Quit date: 03/29/2017    Years since quitting: 4.6   Smokeless tobacco: Never  Vaping Use   Vaping Use: Never used  Substance and Sexual Activity   Alcohol use: Yes     Comment: rare   Drug use: Never   Sexual activity: Not on file  Other Topics Concern   Not on file  Social History Narrative   Not on file   Social Determinants of Health   Financial Resource Strain: Not on file  Food Insecurity: Not on file  Transportation Needs: Not on file  Physical Activity: Not on file  Stress: Not on file  Social Connections: Not on file  Intimate Partner Violence: Not on file    Current Outpatient Medications on File Prior to Visit  Medication Sig Dispense Refill   acetaminophen (TYLENOL) 500 MG tablet Take 1,000 mg by mouth every 6 (six) hours as needed.     atorvastatin (LIPITOR) 40 MG tablet Take 1 tablet (40 mg total) by mouth daily. 120 tablet 0   brompheniramine-pseudoephedrine-DM 30-2-10 MG/5ML syrup Take 5 mLs by mouth 4 (four) times daily as needed. 240 mL 0   cetirizine (ZYRTEC) 10 MG tablet Take 10 mg by mouth daily.     fluticasone (FLONASE) 50 MCG/ACT nasal spray Place 2 sprays into both nostrils daily. (Patient taking differently: Place 2 sprays into both nostrils as needed.) 16 g 6   losartan-hydrochlorothiazide (HYZAAR) 50-12.5 MG tablet Take 1 tablet by mouth daily. 90 tablet 0   Multiple Vitamins-Minerals (MULTI COMPLETE PO) Take by mouth.  predniSONE (DELTASONE) 10 MG tablet Take 1 tablet (10 mg total) by mouth daily with breakfast. 6 tablet 0   sertraline (ZOLOFT) 25 MG tablet Take 1 tablet (25 mg total) by mouth daily. 90 tablet 0   No current facility-administered medications on file prior to visit.    No Known Allergies  Family History  Problem Relation Age of Onset   Thyroid disease Mother    Thyroid disease Father    Thyroid disease Sister    Stomach cancer Neg Hx    Colon cancer Neg Hx    Esophageal cancer Neg Hx    Rectal cancer Neg Hx     BP (!) 144/74   Pulse (!) 125   Ht 5\' 9"  (1.753 m)   Wt 168 lb 3.2 oz (76.3 kg)   SpO2 95%   BMI 24.84 kg/m     Review of Systems denies weight loss, palpitations,  excessive diaphoresis, tremor, anxiety, and heat intolerance.       Objective:   Physical Exam VS: see vs page GEN: no distress HEAD: head: no deformity eyes: no periorbital swelling, no proptosis.   external nose and ears are normal.   NECK: thyroid is 2-3 times normal size, with bilat palpable nodules.   CHEST WALL: no deformity LUNGS: clear to auscultation CV: reg rate and rhythm, no murmur.  MUSCULOSKELETAL: gait is normal and steady.   EXTEMITIES: no deformity.  no leg edema NEURO:  readily moves all 4's.  sensation is intact to touch on all 4's.  No tremor.  SKIN:  Normal texture and temperature.  No rash or suspicious lesion is visible.  Not diaphoretic.   NODES:  None palpable at the neck.   PSYCH: alert, well-oriented.  Does not appear anxious nor depressed.     Lab Results  Component Value Date   TSH 0.369 (L) 10/21/2021   I have reviewed outside records, and summarized: Pt was noted to have low TSH, and referred here.  HTN, anxiety, and dyslipidemia were also addressed     Assessment & Plan:  Hyperthyroidism: mild, but uncontrolled.   Tachycardia: very unlikely thyroid-related, but we'll rx tapazole.    Patient Instructions  Let's check the ultrasound.  you will receive a phone call, about a day and time for an appointment. I have sent a prescription to your pharmacy, to slow the thyroid. If ever you have fever while taking methimazole, stop it and call us, even if the reason is obvious, because of the risk of a rare side-effect. It is best to never miss the medication.  However, if you do miss it, next best is to double up the next time. Please come back for a follow-up appointment in 6 weeks.

## 2021-12-01 NOTE — Patient Instructions (Signed)
Let's check the ultrasound.  you will receive a phone call, about a day and time for an appointment. I have sent a prescription to your pharmacy, to slow the thyroid. If ever you have fever while taking methimazole, stop it and call us, even if the reason is obvious, because of the risk of a rare side-effect. It is best to never miss the medication.  However, if you do miss it, next best is to double up the next time. Please come back for a follow-up appointment in 6 weeks.

## 2021-12-18 ENCOUNTER — Ambulatory Visit
Admission: RE | Admit: 2021-12-18 | Discharge: 2021-12-18 | Disposition: A | Discharge status: Home / Self Care | Payer: BC Managed Care – PPO | Source: Ambulatory Visit | Attending: Endocrinology | Admitting: Endocrinology

## 2021-12-18 DIAGNOSIS — E042 Nontoxic multinodular goiter: Secondary | ICD-10-CM

## 2021-12-19 ENCOUNTER — Encounter (HOSPITAL_COMMUNITY): Payer: Self-pay | Admitting: Gastroenterology

## 2021-12-21 ENCOUNTER — Other Ambulatory Visit: Payer: Self-pay | Admitting: Endocrinology

## 2021-12-31 ENCOUNTER — Encounter (HOSPITAL_COMMUNITY)
Admission: RE | Disposition: A | Discharge status: Home / Self Care | Payer: Self-pay | Source: Home / Self Care | Attending: Gastroenterology

## 2021-12-31 ENCOUNTER — Encounter (HOSPITAL_COMMUNITY): Payer: Self-pay | Admitting: Gastroenterology

## 2021-12-31 ENCOUNTER — Ambulatory Visit (HOSPITAL_COMMUNITY): Payer: BC Managed Care – PPO | Admitting: Anesthesiology

## 2021-12-31 ENCOUNTER — Ambulatory Visit (HOSPITAL_COMMUNITY)
Admission: RE | Admit: 2021-12-31 | Discharge: 2021-12-31 | Disposition: A | Discharge status: Home / Self Care | Payer: BC Managed Care – PPO | Attending: Gastroenterology | Admitting: Gastroenterology

## 2021-12-31 ENCOUNTER — Other Ambulatory Visit: Payer: Self-pay

## 2021-12-31 DIAGNOSIS — E0590 Thyrotoxicosis, unspecified without thyrotoxic crisis or storm: Secondary | ICD-10-CM | POA: Insufficient documentation

## 2021-12-31 DIAGNOSIS — K648 Other hemorrhoids: Secondary | ICD-10-CM | POA: Diagnosis not present

## 2021-12-31 DIAGNOSIS — K641 Second degree hemorrhoids: Secondary | ICD-10-CM

## 2021-12-31 DIAGNOSIS — Z87891 Personal history of nicotine dependence: Secondary | ICD-10-CM | POA: Insufficient documentation

## 2021-12-31 DIAGNOSIS — D123 Benign neoplasm of transverse colon: Secondary | ICD-10-CM

## 2021-12-31 DIAGNOSIS — Z09 Encounter for follow-up examination after completed treatment for conditions other than malignant neoplasm: Secondary | ICD-10-CM | POA: Insufficient documentation

## 2021-12-31 DIAGNOSIS — I10 Essential (primary) hypertension: Secondary | ICD-10-CM | POA: Diagnosis not present

## 2021-12-31 DIAGNOSIS — K644 Residual hemorrhoidal skin tags: Secondary | ICD-10-CM | POA: Insufficient documentation

## 2021-12-31 DIAGNOSIS — K219 Gastro-esophageal reflux disease without esophagitis: Secondary | ICD-10-CM | POA: Insufficient documentation

## 2021-12-31 DIAGNOSIS — K5730 Diverticulosis of large intestine without perforation or abscess without bleeding: Secondary | ICD-10-CM | POA: Diagnosis not present

## 2021-12-31 HISTORY — PX: ENDOSCOPIC MUCOSAL RESECTION: SHX6839

## 2021-12-31 HISTORY — PX: COLONOSCOPY WITH PROPOFOL: SHX5780

## 2021-12-31 HISTORY — PX: SUBMUCOSAL LIFTING INJECTION: SHX6855

## 2021-12-31 HISTORY — PX: HEMOSTASIS CLIP PLACEMENT: SHX6857

## 2021-12-31 SURGERY — COLONOSCOPY WITH PROPOFOL
Anesthesia: Monitor Anesthesia Care

## 2021-12-31 MED ORDER — PROPOFOL 10 MG/ML IV BOLUS
INTRAVENOUS | Status: DC | PRN
  Administered 2021-12-31 (×2): 50 mg via INTRAVENOUS

## 2021-12-31 MED ORDER — SODIUM CHLORIDE 0.9 % IV SOLN: INTRAVENOUS | Status: DC

## 2021-12-31 MED ORDER — PROPOFOL 500 MG/50ML IV EMUL
INTRAVENOUS | Status: DC | PRN
  Administered 2021-12-31: 150 ug/kg/min via INTRAVENOUS

## 2021-12-31 MED ORDER — LACTATED RINGERS IV SOLN: INTRAVENOUS | Status: DC | PRN

## 2021-12-31 SURGICAL SUPPLY — 22 items

## 2021-12-31 NOTE — Discharge Instructions (Signed)
YOU HAD AN ENDOSCOPIC PROCEDURE TODAY: Refer to the procedure report and other information in the discharge instructions given to you for any specific questions about what was found during the examination. If this information does not answer your questions, please call [place] office at [phone] to clarify.  ° °YOU SHOULD EXPECT: Some feelings of bloating in the abdomen. Passage of more gas than usual. Walking can help get rid of the air that was put into your GI tract during the procedure and reduce the bloating. If you had a lower endoscopy (such as a colonoscopy or flexible sigmoidoscopy) you may notice spotting of blood in your stool or on the toilet paper. Some abdominal soreness may be present for a day or two, also. ° °DIET: Your first meal following the procedure should be a light meal and then it is ok to progress to your normal diet. A half-sandwich or bowl of soup is an example of a good first meal. Heavy or fried foods are harder to digest and may make you feel nauseous or bloated. Drink plenty of fluids but you should avoid alcoholic beverages for 24 hours. If you had a esophageal dilation, please see attached instructions for diet.   ° °ACTIVITY: Your care partner should take you home directly after the procedure. You should plan to take it easy, moving slowly for the rest of the day. You can resume normal activity the day after the procedure however YOU SHOULD NOT DRIVE, use power tools, machinery or perform tasks that involve climbing or major physical exertion for 24 hours (because of the sedation medicines used during the test).  ° °SYMPTOMS TO REPORT IMMEDIATELY: °A gastroenterologist can be reached at any hour. Please call [phone]  for any of the following symptoms:  °Following lower endoscopy (colonoscopy, flexible sigmoidoscopy) °Excessive amounts of blood in the stool  °Significant tenderness, worsening of abdominal pains  °Swelling of the abdomen that is new, acute  °Fever of 100° or  higher  °Following upper endoscopy (EGD, EUS, ERCP, esophageal dilation) °Vomiting of blood or coffee ground material  °New, significant abdominal pain  °New, significant chest pain or pain under the shoulder blades  °Painful or persistently difficult swallowing  °New shortness of breath  °Black, tarry-looking or red, bloody stools ° °FOLLOW UP:  °If any biopsies were taken you will be contacted by phone or by letter within the next 1-3 weeks. Call [phone]  if you have not heard about the biopsies in 3 weeks.  °Please also call with any specific questions about appointments or follow up tests. ° °

## 2021-12-31 NOTE — Anesthesia Preprocedure Evaluation (Signed)
Anesthesia Evaluation  Patient identified by MRN, date of birth, ID band Patient awake    Reviewed: Allergy & Precautions, NPO status , Patient's Chart, lab work & pertinent test results  Airway Mallampati: II  TM Distance: >3 FB Neck ROM: Full    Dental  (+) Teeth Intact, Dental Advisory Given   Pulmonary former smoker,    Pulmonary exam normal breath sounds clear to auscultation       Cardiovascular hypertension, Pt. on medications Normal cardiovascular exam Rhythm:Regular Rate:Normal     Neuro/Psych PSYCHIATRIC DISORDERS Anxiety Depression negative neurological ROS     GI/Hepatic Neg liver ROS, GERD  ,Adenomatous polyp   Endo/Other  Hyperthyroidism   Renal/GU negative Renal ROS     Musculoskeletal negative musculoskeletal ROS (+)   Abdominal   Peds  Hematology negative hematology ROS (+)   Anesthesia Other Findings Day of surgery medications reviewed with the patient.  Reproductive/Obstetrics                             Anesthesia Physical Anesthesia Plan  ASA: 2  Anesthesia Plan: MAC   Post-op Pain Management:    Induction: Intravenous  PONV Risk Score and Plan: 1 and Propofol infusion and Treatment may vary due to age or medical condition  Airway Management Planned: Nasal Cannula and Natural Airway  Additional Equipment:   Intra-op Plan:   Post-operative Plan:   Informed Consent: I have reviewed the patients History and Physical, chart, labs and discussed the procedure including the risks, benefits and alternatives for the proposed anesthesia with the patient or authorized representative who has indicated his/her understanding and acceptance.     Dental advisory given  Plan Discussed with: CRNA and Anesthesiologist  Anesthesia Plan Comments:         Anesthesia Quick Evaluation

## 2021-12-31 NOTE — Op Note (Signed)
[facility] Patient Name: Gregg [name] Procedure Date: 12/31/2021 MRN: [id number] Attending MD: Gerrit Heck , MD Date of Birth: 05-05-1956 CSN: [id number] Age: 66 Admit Type: Outpatient Procedure:                Colonoscopy Indications:              High risk colon cancer surveillance: Personal                            history of adenoma (10 mm or greater in size)                           Index Colonoscopy (10/17/2021) notable for 3 polyps                            ranging 5-10 mm (path: Tubular adenoma, SSP), 30 mm                            polyp in proximal transverse colon biopsied and                            tattooed (path: Tubular adenoma), 8 mm flat                            transverse polyp (path: SSP), 2 flat polyps ranging                            8-12 mm in descending colon (path: HP), 2                            subcentimeter sigmoid/descending polyps (path: TA                            x1, HP x1), 2 small rectal polyps (path: HP).                            Sigmoid diverticulosis, internal hemorrhoids.                            Normal TI                           He presents today for short interval repeat                            colonoscopy for Endoscopic Mucosal Resection of the                            large transverse colon polyp. He is otherwise                            without any GI symptoms. CEA was normal. Providers:                Gerrit Heck, MD, Despina Pole, Technician,  Janie Billups, Technician, Margurite Auerbach, [employer]                            Technician, Technician Referring MD:              Medicines:                Monitored Anesthesia Care Complications:            No immediate complications. Estimated Blood Loss:     Estimated blood loss was minimal. Procedure:                Pre-Anesthesia Assessment:                           - Prior to the procedure, a History and Physical                             was performed, and patient medications and                            allergies were reviewed. The patient's tolerance of                            previous anesthesia was also reviewed. The risks                            and benefits of the procedure and the sedation                            options and risks were discussed with the patient.                            All questions were answered, and informed consent                            was obtained. Prior Anticoagulants: The patient has                            taken no previous anticoagulant or antiplatelet                            agents. ASA Grade Assessment: II - A patient with                            mild systemic disease. After reviewing the risks                            and benefits, the patient was deemed in                            satisfactory condition to undergo the procedure.                           After obtaining informed consent, the colonoscope  was passed under direct vision. Throughout the                            procedure, the patient's blood pressure, pulse, and                            oxygen saturations were monitored continuously. The                            CF-HQ190L ([id number]) Olympus colonoscope was                            introduced through the anus and advanced to the the                            cecum, identified by appendiceal orifice and                            ileocecal valve. The colonoscopy was performed                            without difficulty. The patient tolerated the                            procedure well. The quality of the bowel                            preparation was good. The ileocecal valve,                            appendiceal orifice, and rectum were photographed. Scope In: 11:54:29 AM Scope Out: 12:15:44 PM Scope Withdrawal Time: 0 hours 18 minutes 11 seconds  Total Procedure Duration:  0 hours 21 minutes 15 seconds  Findings:      Hemorrhoids were found on perianal exam.      A 30 mm polyp was found in the transverse colon. The polyp was       semi-pedunculated. The polyp was first lifted using 5 cc of Everlift       with appropriate lift and cushion. This was then resected en bloc using       a hot snare. Resection and retrieval were complete. The polyp was       retrieved en bloc with a Roth net. To close a defect after polypectomy,       five hemostatic clips were successfully placed (MR conditional). There       was no bleeding at the end of the procedure. The colonoscope was then       reintroduced to the cecum and air was removed via colonoscope suction.      A tattoo was seen in the transverse colon on the contralateral wall from       the above polyp.      A few small-mouthed diverticula were found in the sigmoid colon.      Non-bleeding internal hemorrhoids were found during retroflexion. The       hemorrhoids were small. Impression:               -  Hemorrhoids found on perianal exam.                           - One 30 mm polyp in the transverse colon, removed                            using 5 cc of Everlift and a hot snare. Resected en                            bloc and retrieved with Jabier Mutton net. Clips (MR                            conditional) were placed for closure of the                            polypectomy site and to prophylax against delayed                            post polypectomy bleed.                           - A tattoo was seen in the transverse colon.                           - Diverticulosis in the sigmoid colon.                           - Non-bleeding internal hemorrhoids. Moderate Sedation:      Not Applicable - Patient had care per Anesthesia. Recommendation:           - Patient has a contact number available for                            emergencies. The signs and symptoms of potential                            delayed  complications were discussed with the                            patient. Return to normal activities tomorrow.                            Written discharge instructions were provided to the                            patient.                           - Resume previous diet today.                           - Continue present medications.                           - Await pathology results.                           -  Repeat colonoscopy in 1 year for surveillance, or                            sooner depending on pathology results. Procedure Code(s):        --- Professional ---                           [phone], Colonoscopy, flexible; with removal of                            tumor(s), polyp(s), or other lesion(s) by snare                            technique                           45381, Colonoscopy, flexible; with directed                            submucosal injection(s), any substance Diagnosis Code(s):        --- Professional ---                           Z86.010, Personal history of colonic polyps                           K64.8, Other hemorrhoids                           K63.5, Polyp of colon                           K57.30, Diverticulosis of large intestine without                            perforation or abscess without bleeding CPT copyright 2019 [employer] Association. All rights reserved. The codes documented in this report are preliminary and upon coder review may  be revised to meet current compliance requirements. Gerrit Heck, MD 12/31/2021 12:29:15 PM Number of Addenda: 0

## 2021-12-31 NOTE — Transfer of Care (Signed)
Immediate Anesthesia Transfer of Care Note  Patient: Gregg [name]  Procedure(s) Performed: COLONOSCOPY WITH PROPOFOL ENDOSCOPIC MUCOSAL RESECTION SUBMUCOSAL LIFTING INJECTION HEMOSTASIS CLIP PLACEMENT  Patient Location: PACU and Endoscopy Unit  Anesthesia Type:MAC  Level of Consciousness: awake, alert  and oriented  Airway & Oxygen Therapy: Patient Spontanous Breathing  Post-op Assessment: Report given to RN and Post -op Vital signs reviewed and stable  Post vital signs: Reviewed and stable  Last Vitals:  Vitals Value Taken Time  BP 114/72 12/31/21 1220  Temp    Pulse 84 12/31/21 1221  Resp 17 12/31/21 1221  SpO2 95 % 12/31/21 1221  Vitals shown include unvalidated device data.  Last Pain:  Vitals:   12/31/21 1036  TempSrc: Temporal  PainSc: 0-No pain         Complications: No notable events documented.

## 2021-12-31 NOTE — Interval H&P Note (Signed)
History and Physical Interval Note:  12/31/2021 11:48 AM  Gregg [name]  has presented today for surgery, with the diagnosis of adenomatous polyp.  The various methods of treatment have been discussed with the patient and family. After consideration of risks, benefits and other options for treatment, the patient has consented to  Procedure(s): COLONOSCOPY WITH PROPOFOL (N/A) ENDOSCOPIC MUCOSAL RESECTION (N/A) as a surgical intervention.  The patient's history has been reviewed, patient examined, no change in status, stable for surgery.  I have reviewed the patient's chart and labs.  Questions were answered to the patient's satisfaction.     Dominic Pea Eilee Schader

## 2021-12-31 NOTE — H&P (Signed)
GASTROENTEROLOGY PROCEDURE H&P NOTE   Primary Care Physician: Camillia Herter, NP    Reason for Procedure:  Transverse colon polyp  Plan:    Colonoscopy with endoscopic mucosal resection (EMR)   The nature of the procedure, as well as the risks, benefits, and alternatives were carefully and thoroughly reviewed with the patient. Ample time for discussion and questions allowed. The patient understood, was satisfied, and agreed to proceed.     HPI: Gregg [name] is a 66 y.o. male who presents for colonoscopy for endoscopic mucosal resection of known 30 mm polyp in the transverse colon (biopsy confirmed tubular adenoma).  He is otherwise without active GI symptoms.  Endoscopic History: - Colonoscopy (10/17/2021): 3 polyps ranging 5-10 mm (path: Tubular adenoma, SSP), 30 mm polyp in proximal transverse colon biopsied and tattooed (path: Tubular adenoma), 8 mm flat transverse polyp (path: SSP), 2 flat polyps ranging 8-12 mm in descending colon (path: HP), 2 subcentimeter sigmoid/descending polyps (path: TA x1, HP x1), 2 small rectal polyps (path: HP).  Sigmoid diverticulosis, internal hemorrhoids.  Normal TI  Past Medical History:  Diagnosis Date   Anxiety    Bilateral inguinal hernia 06/02/2021   GERD (gastroesophageal reflux disease)    History of kidney stones 2020   Hypertension    Sinus complaint 06/02/2021   x 1 week per pt   Wears glasses 06/02/2021    Past Surgical History:  Procedure Laterality Date   EXTRACORPOREAL SHOCK WAVE LITHOTRIPSY Left 02/23/2019   Procedure: EXTRACORPOREAL SHOCK WAVE LITHOTRIPSY (ESWL);  Surgeon: Bjorn Loser, MD;  Location: [facility];  Service: Urology;  Laterality: Left;   INGUINAL HERNIA REPAIR Bilateral 06/06/2021   Procedure: LAPAROSCOPIC BILATERAL INGUINAL HERNIA REPAIRS WITH MESH;  Surgeon: Michael Boston, MD;  Location: [facility];  Service: General;  Laterality: Bilateral;  TAP BLOCK   LAPAROSCOPIC APPENDECTOMY N/A  03/07/2021   Procedure: APPENDECTOMY LAPAROSCOPIC;  Surgeon: Michael Boston, MD;  Location: [facility];  Service: General;  Laterality: N/A;    Prior to Admission medications   Medication Sig Start Date End Date Taking? Authorizing Provider  atorvastatin (LIPITOR) 40 MG tablet Take 1 tablet (40 mg total) by mouth daily. 11/25/21 03/25/22 Yes Minette Brine, Amy J, NP  levocetirizine (XYZAL) 5 MG tablet Take 5 mg by mouth every evening.   Yes [provider]  losartan-hydrochlorothiazide (HYZAAR) 50-12.5 MG tablet Take 1 tablet by mouth daily. 11/20/21 02/18/22 Yes Minette Brine, Amy J, NP  methimazole (TAPAZOLE) 5 MG tablet TAKE 0.5 TABLETS (2.5 MG TOTAL) BY MOUTH 3 (THREE) TIMES A WEEK. 12/24/21  Yes Renato Shin, MD  Multiple Vitamins-Minerals [facility] COMPLETE PO) Take by mouth.   Yes [provider]  sertraline (ZOLOFT) 25 MG tablet Take 1 tablet (25 mg total) by mouth daily. 11/20/21 02/18/22 Yes Minette Brine, Amy J, NP  acetaminophen (TYLENOL) 500 MG tablet Take 1,000 mg by mouth every 6 (six) hours as needed for headache.    [provider]  brompheniramine-pseudoephedrine-DM 30-2-10 MG/5ML syrup Take 5 mLs by mouth 4 (four) times daily as needed. Patient not taking: Reported on 12/22/2021 11/29/21   Gildardo Pounds, NP  fluticasone [facility]) 50 MCG/ACT nasal spray Place 2 sprays into both nostrils daily. Patient taking differently: Place 2 sprays into both nostrils daily as needed. 11/01/21   Gildardo Pounds, NP    Current Facility-Administered Medications  Medication Dose Route Frequency Provider Last Rate Last Admin   0.9 %  sodium chloride infusion   Intravenous Continuous Sabre Romberger V, DO  Allergies as of 10/30/2021   (No Known Allergies)    Family History  Problem Relation Age of Onset   Thyroid disease Mother    Thyroid disease Father    Thyroid disease Sister    Stomach cancer Neg Hx    Colon cancer Neg Hx    Esophageal cancer Neg Hx    Rectal cancer Neg Hx      Social History   Socioeconomic History   Marital status: Single    Spouse name: Not on file   Number of children: Not on file   Years of education: Not on file   Highest education level: Not on file  Occupational History   Not on file  Tobacco Use   Smoking status: Former    Packs/day: 0.50    Years: 40.00    Pack years: 20.00    Types: Cigarettes    Quit date: 03/29/2017    Years since quitting: 4.7   Smokeless tobacco: Never  Vaping Use   Vaping Use: Never used  Substance and Sexual Activity   Alcohol use: Yes    Comment: rare   Drug use: Never   Sexual activity: Not on file  Other Topics Concern   Not on file  Social History Narrative   Not on file   Social Determinants of Health   Financial Resource Strain: Not on file  Food Insecurity: Not on file  Transportation Needs: Not on file  Physical Activity: Not on file  Stress: Not on file  Social Connections: Not on file  Intimate Partner Violence: Not on file    Physical Exam: Vital signs in last 24 hours: @BP  (!) 154/98    Pulse 100    Temp 98.4 F (36.9 C) (Temporal)    Resp 16    Ht 5\' 9"  (1.753 m)    Wt 73.5 kg    SpO2 97%    BMI 23.92 kg/m  GEN: NAD EYE: Sclerae anicteric ENT: MMM CV: Non-tachycardic Pulm: CTA b/l GI: Soft, NT/ND NEURO:  Alert & Oriented x Perry, DO [facility]   12/31/2021 11:47 AM

## 2022-01-01 ENCOUNTER — Telehealth: Payer: Self-pay | Admitting: General Surgery

## 2022-01-01 LAB — SURGICAL PATHOLOGY

## 2022-01-01 NOTE — Telephone Encounter (Signed)
Notified the patient of the resection of his tubular adenoma and clear margings. Patient was grateful to hear this and understands we will send him a recall for 1 year to continue with polyp surveillance.

## 2022-01-01 NOTE — Telephone Encounter (Signed)
-----   Message from [place], DO sent at 01/01/2022 12:34 PM EST ----- The polyp resected from the transverse colon was a Tubular Adenoma without high-grade dysplasia or focus of malignancy.  The margins are clear indicating complete resection.  Recommend repeat colonoscopy in 1 year for ongoing polyp surveillance.

## 2022-01-01 NOTE — Progress Notes (Signed)
Spoke with pt via phone to follow up on colonoscopy. Pt reported that he was awakened in the night and had developed a low grade fever (he stated temp ~100.0) and states he is very fatigued today. Instructed pt to call [place] office. Message sent to MD Cirigliano.  Debarah Crape, RN 01/01/22 2:12 PM

## 2022-01-02 ENCOUNTER — Telehealth: Payer: Self-pay | Admitting: General Surgery

## 2022-01-02 ENCOUNTER — Encounter (HOSPITAL_COMMUNITY): Payer: Self-pay | Admitting: Gastroenterology

## 2022-01-02 NOTE — Telephone Encounter (Signed)
-----   Message from [place], DO sent at 01/02/2022  7:45 AM EST ----- Regarding: FW: Pt reports fever and fatigue post-op Would you mind calling this patient and just check in on him and make sure no more fevers, and tolerating p.o. intake, no hematochezia, etc.?  Thank you. ----- Message ----- From: Debarah Crape, RN Sent: 01/01/2022   2:10 PM EST To: Lavena Bullion, DO Subject: Pt reports fever and fatigue post-op           Good afternoon Dr Bryan Lemma,  I followed up by phone with Gregg [name] this afternoon and he stated that he awoke last night with a fever (he reported temp of 100.32F) and he has felt very fatigued today. I instructed him to call the [facility] office regarding his symptoms and that I would make you aware.  Thanks, Dulcy Fanny, RN

## 2022-01-02 NOTE — Anesthesia Postprocedure Evaluation (Signed)
Anesthesia Post Note  Patient: Gregg [name]  Procedure(s) Performed: COLONOSCOPY WITH PROPOFOL ENDOSCOPIC MUCOSAL RESECTION SUBMUCOSAL LIFTING INJECTION HEMOSTASIS CLIP PLACEMENT     Patient location during evaluation: Endoscopy Anesthesia Type: MAC Level of consciousness: awake and alert Pain management: pain level controlled Vital Signs Assessment: post-procedure vital signs reviewed and stable Respiratory status: spontaneous breathing, nonlabored ventilation, respiratory function stable and patient connected to nasal cannula oxygen Cardiovascular status: stable and blood pressure returned to baseline Postop Assessment: no apparent nausea or vomiting Anesthetic complications: no   No notable events documented.  Last Vitals:  Vitals:   12/31/21 1240 12/31/21 1250  BP: 132/73 131/84  Pulse: 78 74  Resp: 18 18  Temp:    SpO2: 95% 95%    Last Pain:  Vitals:   12/31/21 1220  TempSrc: Oral  PainSc:    Pain Goal:                   Catalina Gravel

## 2022-01-02 NOTE — Telephone Encounter (Signed)
Spoke with the patient and he stated that he is feeling well this morning, afebrile, no hematochezia and is able to eat and drink. He was instructed to call the office if anything changes. Patient verbalized understanding.

## 2022-01-08 ENCOUNTER — Observation Stay (HOSPITAL_COMMUNITY): Payer: BC Managed Care – PPO | Admitting: Anesthesiology

## 2022-01-08 ENCOUNTER — Emergency Department (HOSPITAL_COMMUNITY): Payer: BC Managed Care – PPO

## 2022-01-08 ENCOUNTER — Encounter (HOSPITAL_COMMUNITY): Payer: Self-pay

## 2022-01-08 ENCOUNTER — Inpatient Hospital Stay: Admit: 2022-01-08 | Payer: BC Managed Care – PPO | Admitting: Surgery

## 2022-01-08 ENCOUNTER — Other Ambulatory Visit: Payer: Self-pay

## 2022-01-08 ENCOUNTER — Observation Stay (HOSPITAL_COMMUNITY)
Admission: EM | Admit: 2022-01-08 | Discharge: 2022-01-09 | Disposition: A | Discharge status: Home / Self Care | Payer: BC Managed Care – PPO | Attending: Emergency Medicine | Admitting: Emergency Medicine

## 2022-01-08 ENCOUNTER — Encounter (HOSPITAL_COMMUNITY)
Admission: EM | Disposition: A | Discharge status: Home / Self Care | Payer: Self-pay | Source: Home / Self Care | Attending: Emergency Medicine

## 2022-01-08 DIAGNOSIS — Z79899 Other long term (current) drug therapy: Secondary | ICD-10-CM | POA: Diagnosis not present

## 2022-01-08 DIAGNOSIS — Z87891 Personal history of nicotine dependence: Secondary | ICD-10-CM | POA: Insufficient documentation

## 2022-01-08 DIAGNOSIS — K8020 Calculus of gallbladder without cholecystitis without obstruction: Secondary | ICD-10-CM

## 2022-01-08 DIAGNOSIS — Z20822 Contact with and (suspected) exposure to covid-19: Secondary | ICD-10-CM | POA: Diagnosis not present

## 2022-01-08 DIAGNOSIS — Z9049 Acquired absence of other specified parts of digestive tract: Secondary | ICD-10-CM

## 2022-01-08 DIAGNOSIS — I10 Essential (primary) hypertension: Secondary | ICD-10-CM | POA: Insufficient documentation

## 2022-01-08 DIAGNOSIS — K8012 Calculus of gallbladder with acute and chronic cholecystitis without obstruction: Secondary | ICD-10-CM | POA: Diagnosis not present

## 2022-01-08 DIAGNOSIS — R109 Unspecified abdominal pain: Secondary | ICD-10-CM

## 2022-01-08 DIAGNOSIS — R1010 Upper abdominal pain, unspecified: Secondary | ICD-10-CM

## 2022-01-08 DIAGNOSIS — R1011 Right upper quadrant pain: Secondary | ICD-10-CM

## 2022-01-08 LAB — COMPREHENSIVE METABOLIC PANEL
ALT: 43 U/L (ref 0–44)
AST: 33 U/L (ref 15–41)
Albumin: 4.6 g/dL (ref 3.5–5.0)
Alkaline Phosphatase: 74 U/L (ref 38–126)
Anion gap: 13 (ref 5–15)
BUN: 20 mg/dL (ref 8–23)
CO2: 26 mmol/L (ref 22–32)
Calcium: 10.4 mg/dL — ABNORMAL HIGH (ref 8.9–10.3)
Chloride: 97 mmol/L — ABNORMAL LOW (ref 98–111)
Creatinine, Ser: 0.98 mg/dL (ref 0.61–1.24)
GFR, Estimated: >60 mL/min (ref >60)
Glucose, Bld: 159 mg/dL — ABNORMAL HIGH (ref 70–99)
Potassium: 3.4 mmol/L — ABNORMAL LOW (ref 3.5–5.1)
Sodium: 136 mmol/L (ref 135–145)
Total Bilirubin: 0.6 mg/dL (ref 0.3–1.2)
Total Protein: 8.2 g/dL — ABNORMAL HIGH (ref 6.5–8.1)

## 2022-01-08 LAB — RESP PANEL BY RT-PCR (FLU A&B, COVID) ARPGX2
Influenza A by PCR: NEGATIVE
Influenza B by PCR: NEGATIVE
SARS Coronavirus 2 by RT PCR: NEGATIVE

## 2022-01-08 LAB — CREATININE, SERUM
Creatinine, Ser: 1.13 mg/dL (ref 0.61–1.24)
GFR, Estimated: >60 mL/min (ref >60)

## 2022-01-08 LAB — CBC
HCT: 46.2 % (ref 39.0–52.0)
HCT: 43.8 % (ref 39.0–52.0)
Hemoglobin: 16.2 g/dL (ref 13.0–17.0)
Hemoglobin: 14.6 g/dL (ref 13.0–17.0)
MCH: 32 pg (ref 26.0–34.0)
MCH: 31.9 pg (ref 26.0–34.0)
MCHC: 35.1 g/dL (ref 30.0–36.0)
MCHC: 33.3 g/dL (ref 30.0–36.0)
MCV: 91.1 fL (ref 80.0–100.0)
MCV: 95.6 fL (ref 80.0–100.0)
Platelets: 273 10*3/uL (ref 150–400)
Platelets: 233 10*3/uL (ref 150–400)
RBC: 5.07 MIL/uL (ref 4.22–5.81)
RBC: 4.58 MIL/uL (ref 4.22–5.81)
RDW: 12.4 % (ref 11.5–15.5)
RDW: 12.7 % (ref 11.5–15.5)
WBC: 10.2 10*3/uL (ref 4.0–10.5)
WBC: 16.7 10*3/uL — ABNORMAL HIGH (ref 4.0–10.5)
nRBC: 0 % (ref 0.0–0.2)
nRBC: 0 % (ref 0.0–0.2)

## 2022-01-08 LAB — URINALYSIS, ROUTINE W REFLEX MICROSCOPIC
Bilirubin Urine: NEGATIVE
Glucose, UA: NEGATIVE mg/dL
Hgb urine dipstick: NEGATIVE
Ketones, ur: 5 mg/dL — AB
Leukocytes,Ua: NEGATIVE
Nitrite: NEGATIVE
Protein, ur: NEGATIVE mg/dL
Specific Gravity, Urine: 1.015 (ref 1.005–1.030)
pH: 8 (ref 5.0–8.0)

## 2022-01-08 LAB — LIPASE, BLOOD: Lipase: 47 U/L (ref 11–51)

## 2022-01-08 LAB — TROPONIN I (HIGH SENSITIVITY): Troponin I (High Sensitivity): 4 ng/L (ref <18)

## 2022-01-08 SURGERY — CHOLECYSTECTOMY, ROBOT-ASSISTED, LAPAROSCOPIC
Anesthesia: General | Site: Abdomen

## 2022-01-08 MED ORDER — SUGAMMADEX SODIUM 200 MG/2ML IV SOLN
INTRAVENOUS | Status: DC | PRN
  Administered 2022-01-08: 200 mg via INTRAVENOUS

## 2022-01-08 MED ORDER — MORPHINE SULFATE (PF) 2 MG/ML IV SOLN: 1.0000 mg | INTRAVENOUS | Status: DC | PRN

## 2022-01-08 MED ORDER — LIDOCAINE 2% (20 MG/ML) 5 ML SYRINGE
INTRAMUSCULAR | Status: DC | PRN
  Administered 2022-01-08: 100 mg via INTRAVENOUS

## 2022-01-08 MED ORDER — ACETAMINOPHEN 10 MG/ML IV SOLN
INTRAVENOUS | Status: AC
  Filled 2022-01-08: qty 100

## 2022-01-08 MED ORDER — METHIMAZOLE 5 MG PO TABS
2.5000 mg | ORAL_TABLET | ORAL | Status: DC
  Filled 2022-01-08: qty 1

## 2022-01-08 MED ORDER — PROCHLORPERAZINE MALEATE 10 MG PO TABS
10.0000 mg | ORAL_TABLET | Freq: Four times a day (QID) | ORAL | Status: DC | PRN
  Filled 2022-01-08: qty 1

## 2022-01-08 MED ORDER — PANTOPRAZOLE SODIUM 40 MG IV SOLR: 40.0000 mg | Freq: Every day | INTRAVENOUS | Status: DC

## 2022-01-08 MED ORDER — DIPHENHYDRAMINE HCL 50 MG/ML IJ SOLN: 12.5000 mg | Freq: Four times a day (QID) | INTRAMUSCULAR | Status: DC | PRN

## 2022-01-08 MED ORDER — FENTANYL CITRATE (PF) 100 MCG/2ML IJ SOLN
INTRAMUSCULAR | Status: DC | PRN
  Administered 2022-01-08: 100 ug via INTRAVENOUS

## 2022-01-08 MED ORDER — SODIUM CHLORIDE 0.9 % IV SOLN
2.0000 g | Freq: Every day | INTRAVENOUS | Status: DC
  Administered 2022-01-08: 2 g via INTRAVENOUS
  Filled 2022-01-08: qty 20

## 2022-01-08 MED ORDER — ACETAMINOPHEN 325 MG PO TABS: 650.0000 mg | ORAL_TABLET | Freq: Four times a day (QID) | ORAL | Status: DC | PRN

## 2022-01-08 MED ORDER — ONDANSETRON HCL 4 MG/2ML IJ SOLN
INTRAMUSCULAR | Status: DC | PRN
Start: 2022-01-08 — End: 2022-01-08
  Administered 2022-01-08: 4 mg via INTRAVENOUS

## 2022-01-08 MED ORDER — ONDANSETRON 4 MG PO TBDP: 4.0000 mg | ORAL_TABLET | Freq: Four times a day (QID) | ORAL | Status: DC | PRN

## 2022-01-08 MED ORDER — SODIUM CHLORIDE (PF) 0.9 % IJ SOLN
INTRAMUSCULAR | Status: AC
  Filled 2022-01-08: qty 10

## 2022-01-08 MED ORDER — KCL IN DEXTROSE-NACL 20-5-0.45 MEQ/L-%-% IV SOLN
INTRAVENOUS | Status: DC
  Filled 2022-01-08 (×2): qty 1000

## 2022-01-08 MED ORDER — METOPROLOL TARTRATE 5 MG/5ML IV SOLN: 5.0000 mg | Freq: Four times a day (QID) | INTRAVENOUS | Status: DC | PRN

## 2022-01-08 MED ORDER — FENTANYL CITRATE (PF) 100 MCG/2ML IJ SOLN
INTRAMUSCULAR | Status: AC
  Filled 2022-01-08: qty 2

## 2022-01-08 MED ORDER — BUPIVACAINE LIPOSOME 1.3 % IJ SUSP
INTRAMUSCULAR | Status: AC
  Filled 2022-01-08: qty 20

## 2022-01-08 MED ORDER — ONDANSETRON HCL 4 MG/2ML IJ SOLN: 4.0000 mg | Freq: Four times a day (QID) | INTRAMUSCULAR | Status: DC | PRN

## 2022-01-08 MED ORDER — KETOROLAC TROMETHAMINE 30 MG/ML IJ SOLN
INTRAMUSCULAR | Status: DC | PRN
Start: 2022-01-08 — End: 2022-01-08
  Administered 2022-01-08: 15 mg via INTRAVENOUS

## 2022-01-08 MED ORDER — PHENYLEPHRINE 40 MCG/ML (10ML) SYRINGE FOR IV PUSH (FOR BLOOD PRESSURE SUPPORT)
PREFILLED_SYRINGE | INTRAVENOUS | Status: DC | PRN
  Administered 2022-01-08: 80 ug via INTRAVENOUS
  Administered 2022-01-08 (×4): 120 ug via INTRAVENOUS

## 2022-01-08 MED ORDER — ROCURONIUM BROMIDE 10 MG/ML (PF) SYRINGE
PREFILLED_SYRINGE | INTRAVENOUS | Status: DC | PRN
  Administered 2022-01-08: 20 mg via INTRAVENOUS
  Administered 2022-01-08: 60 mg via INTRAVENOUS

## 2022-01-08 MED ORDER — ONDANSETRON HCL 4 MG/2ML IJ SOLN
4.0000 mg | Freq: Once | INTRAMUSCULAR | Status: AC
  Administered 2022-01-08: 4 mg via INTRAVENOUS
  Filled 2022-01-08: qty 2

## 2022-01-08 MED ORDER — HYDROMORPHONE HCL 1 MG/ML IJ SOLN
1.0000 mg | INTRAMUSCULAR | Status: DC | PRN
  Administered 2022-01-08: 1 mg via INTRAVENOUS
  Filled 2022-01-08: qty 1

## 2022-01-08 MED ORDER — 0.9 % SODIUM CHLORIDE (POUR BTL) OPTIME
TOPICAL | Status: DC | PRN
  Administered 2022-01-08: 1000 mL

## 2022-01-08 MED ORDER — HYDROCODONE-ACETAMINOPHEN 5-325 MG PO TABS
1.0000 | ORAL_TABLET | ORAL | Status: DC | PRN
  Administered 2022-01-08 – 2022-01-09 (×2): 2 via ORAL
  Filled 2022-01-08 (×2): qty 2

## 2022-01-08 MED ORDER — HYDROCHLOROTHIAZIDE 12.5 MG PO TABS: 12.5000 mg | ORAL_TABLET | Freq: Every day | ORAL | Status: DC

## 2022-01-08 MED ORDER — PROPOFOL 10 MG/ML IV BOLUS
INTRAVENOUS | Status: AC
  Filled 2022-01-08: qty 20

## 2022-01-08 MED ORDER — POTASSIUM CHLORIDE IN NACL 20-0.9 MEQ/L-% IV SOLN
INTRAVENOUS | Status: DC
  Filled 2022-01-08: qty 1000

## 2022-01-08 MED ORDER — PANTOPRAZOLE SODIUM 40 MG IV SOLR
40.0000 mg | Freq: Every day | INTRAVENOUS | Status: DC
  Administered 2022-01-08: 40 mg via INTRAVENOUS
  Filled 2022-01-08: qty 40

## 2022-01-08 MED ORDER — SODIUM CHLORIDE (PF) 0.9 % IJ SOLN
INTRAMUSCULAR | Status: AC
  Filled 2022-01-08: qty 50

## 2022-01-08 MED ORDER — HEPARIN SODIUM (PORCINE) 5000 UNIT/ML IJ SOLN
5000.0000 [IU] | Freq: Three times a day (TID) | INTRAMUSCULAR | Status: DC
  Administered 2022-01-08: 5000 [IU] via SUBCUTANEOUS
  Filled 2022-01-08 (×2): qty 1

## 2022-01-08 MED ORDER — SIMETHICONE 80 MG PO CHEW
40.0000 mg | CHEWABLE_TABLET | Freq: Four times a day (QID) | ORAL | Status: DC | PRN
  Filled 2022-01-08: qty 1

## 2022-01-08 MED ORDER — LOSARTAN POTASSIUM 50 MG PO TABS: 50.0000 mg | ORAL_TABLET | Freq: Every day | ORAL | Status: DC

## 2022-01-08 MED ORDER — OXYCODONE HCL 5 MG PO TABS: 5.0000 mg | ORAL_TABLET | ORAL | Status: DC | PRN

## 2022-01-08 MED ORDER — FENTANYL CITRATE PF 50 MCG/ML IJ SOSY
50.0000 ug | PREFILLED_SYRINGE | Freq: Once | INTRAMUSCULAR | Status: AC
  Administered 2022-01-08: 50 ug via INTRAVENOUS
  Filled 2022-01-08: qty 1

## 2022-01-08 MED ORDER — SODIUM CHLORIDE 0.9 % IV BOLUS
1000.0000 mL | Freq: Once | INTRAVENOUS | Status: AC
  Administered 2022-01-08: 1000 mL via INTRAVENOUS

## 2022-01-08 MED ORDER — DIPHENHYDRAMINE HCL 12.5 MG/5ML PO ELIX: 12.5000 mg | ORAL_SOLUTION | Freq: Four times a day (QID) | ORAL | Status: DC | PRN

## 2022-01-08 MED ORDER — DEXAMETHASONE SODIUM PHOSPHATE 10 MG/ML IJ SOLN
INTRAMUSCULAR | Status: DC | PRN
  Administered 2022-01-08: 8 mg via INTRAVENOUS

## 2022-01-08 MED ORDER — MORPHINE SULFATE (PF) 4 MG/ML IV SOLN
4.0000 mg | Freq: Once | INTRAVENOUS | Status: AC
  Administered 2022-01-08: 4 mg via INTRAVENOUS
  Filled 2022-01-08: qty 1

## 2022-01-08 MED ORDER — PROCHLORPERAZINE EDISYLATE 10 MG/2ML IJ SOLN: 5.0000 mg | Freq: Four times a day (QID) | INTRAMUSCULAR | Status: DC | PRN

## 2022-01-08 MED ORDER — LACTATED RINGERS IV SOLN: INTRAVENOUS | Status: DC

## 2022-01-08 MED ORDER — METHOCARBAMOL 500 MG PO TABS: 500.0000 mg | ORAL_TABLET | Freq: Four times a day (QID) | ORAL | Status: DC | PRN

## 2022-01-08 MED ORDER — STERILE WATER FOR INJECTION IJ SOLN
INTRAMUSCULAR | Status: DC | PRN
  Administered 2022-01-08: 1 mL via INTRAVENOUS

## 2022-01-08 MED ORDER — ENOXAPARIN SODIUM 40 MG/0.4ML IJ SOSY: 40.0000 mg | PREFILLED_SYRINGE | INTRAMUSCULAR | Status: DC

## 2022-01-08 MED ORDER — IOHEXOL 300 MG/ML  SOLN
100.0000 mL | Freq: Once | INTRAMUSCULAR | Status: AC | PRN
  Administered 2022-01-08: 100 mL via INTRAVENOUS

## 2022-01-08 MED ORDER — BUPIVACAINE LIPOSOME 1.3 % IJ SUSP
INTRAMUSCULAR | Status: DC | PRN
  Administered 2022-01-08: 20 mL

## 2022-01-08 MED ORDER — LACTATED RINGERS IR SOLN
Status: DC | PRN
  Administered 2022-01-08: 1000 mL

## 2022-01-08 MED ORDER — PIPERACILLIN-TAZOBACTAM 3.375 G IVPB
3.3750 g | Freq: Three times a day (TID) | INTRAVENOUS | Status: DC
  Administered 2022-01-08 – 2022-01-09 (×2): 3.375 g via INTRAVENOUS
  Filled 2022-01-08 (×2): qty 50

## 2022-01-08 MED ORDER — ACETAMINOPHEN 650 MG RE SUPP: 650.0000 mg | Freq: Four times a day (QID) | RECTAL | Status: DC | PRN

## 2022-01-08 MED ORDER — LOSARTAN POTASSIUM-HCTZ 50-12.5 MG PO TABS: 1.0000 | ORAL_TABLET | Freq: Every day | ORAL | Status: DC

## 2022-01-08 MED ORDER — PROPOFOL 10 MG/ML IV BOLUS
INTRAVENOUS | Status: DC | PRN
  Administered 2022-01-08: 140 mg via INTRAVENOUS

## 2022-01-08 MED ORDER — SERTRALINE HCL 25 MG PO TABS: 25.0000 mg | ORAL_TABLET | Freq: Every day | ORAL | Status: DC

## 2022-01-08 MED ORDER — ACETAMINOPHEN 10 MG/ML IV SOLN
INTRAVENOUS | Status: DC | PRN
Start: 2022-01-08 — End: 2022-01-08
  Administered 2022-01-08: 1000 mg via INTRAVENOUS

## 2022-01-08 MED ORDER — SODIUM CHLORIDE (PF) 0.9 % IJ SOLN
INTRAMUSCULAR | Status: DC | PRN
Start: 2022-01-08 — End: 2022-01-08
  Administered 2022-01-08: 10 mL

## 2022-01-08 MED ORDER — POLYETHYLENE GLYCOL 3350 17 G PO PACK: 17.0000 g | PACK | Freq: Every day | ORAL | Status: DC | PRN

## 2022-01-08 SURGICAL SUPPLY — 49 items
APPLIER CLIP 5 13 M/L LIGAMAX5 (MISCELLANEOUS)
BLADE SURG 15 STRL LF DISP TIS (BLADE) ×1 IMPLANT
BLADE SURG 15 STRL SS (BLADE) ×1
CLIP APPLIE 5 13 M/L LIGAMAX5 (MISCELLANEOUS) IMPLANT
CLIP LIGATING HEM O LOK PURPLE (MISCELLANEOUS) IMPLANT
CLIP LIGATING HEMO O LOK GREEN (MISCELLANEOUS) IMPLANT
COVER SURGICAL LIGHT HANDLE (MISCELLANEOUS) ×2 IMPLANT
COVER TIP SHEARS 8 DVNC (MISCELLANEOUS) ×1 IMPLANT
COVER TIP SHEARS 8MM DA VINCI (MISCELLANEOUS) ×1
DECANTER SPIKE VIAL GLASS SM (MISCELLANEOUS) ×1 IMPLANT
DERMABOND ADVANCED (GAUZE/BANDAGES/DRESSINGS) ×1
DERMABOND ADVANCED .7 DNX12 (GAUZE/BANDAGES/DRESSINGS) ×1 IMPLANT
DRAPE ARM DVNC X/XI (DISPOSABLE) ×4 IMPLANT
DRAPE COLUMN DVNC XI (DISPOSABLE) ×1 IMPLANT
DRAPE DA VINCI XI ARM (DISPOSABLE) ×4
DRAPE DA VINCI XI COLUMN (DISPOSABLE) ×1
ELECT REM PT RETURN 15FT ADLT (MISCELLANEOUS) ×2 IMPLANT
GLOVE SURG ENC TEXT LTX SZ8 (GLOVE) ×4 IMPLANT
GOWN STRL REUS W/TWL XL LVL3 (GOWN DISPOSABLE) ×8 IMPLANT
GRASPER SUT TROCAR 14GX15 (MISCELLANEOUS) IMPLANT
IRRIG SUCT STRYKERFLOW 2 WTIP (MISCELLANEOUS) ×2
IRRIGATION SUCT STRKRFLW 2 WTP (MISCELLANEOUS) IMPLANT
KIT BASIN OR (CUSTOM PROCEDURE TRAY) ×2 IMPLANT
KIT TURNOVER KIT A (KITS) ×1 IMPLANT
MANIFOLD NEPTUNE II (INSTRUMENTS) ×2 IMPLANT
MARKER SKIN DUAL TIP RULER LAB (MISCELLANEOUS) ×2 IMPLANT
NEEDLE HYPO 22GX1.5 SAFETY (NEEDLE) ×2 IMPLANT
OBTURATOR OPTICAL STANDARD 8MM (TROCAR) ×1
OBTURATOR OPTICAL STND 8 DVNC (TROCAR) ×1
OBTURATOR OPTICALSTD 8 DVNC (TROCAR) ×1 IMPLANT
PACK CARDIOVASCULAR III (CUSTOM PROCEDURE TRAY) ×2 IMPLANT
PAD POSITIONING PINK XL (MISCELLANEOUS) ×1 IMPLANT
SEAL CANN UNIV 5-8 DVNC XI (MISCELLANEOUS) ×4 IMPLANT
SEAL XI 5MM-8MM UNIVERSAL (MISCELLANEOUS) ×4
SEALER VESSEL DA VINCI XI (MISCELLANEOUS) ×1
SEALER VESSEL EXT DVNC XI (MISCELLANEOUS) ×1 IMPLANT
SOL ANTI FOG 6CC (MISCELLANEOUS) ×1 IMPLANT
SOLUTION ANTI FOG 6CC (MISCELLANEOUS) ×1
SOLUTION ELECTROLUBE (MISCELLANEOUS) ×2 IMPLANT
SPONGE T-LAP 18X18 ~~LOC~~+RFID (SPONGE) ×2 IMPLANT
SUT MNCRL AB 4-0 PS2 18 (SUTURE) ×4 IMPLANT
SUT VICRYL 0 TIES 12 18 (SUTURE) IMPLANT
SYR 20ML LL LF (SYRINGE) ×2 IMPLANT
SYS RETRIEVAL 5MM INZII UNIV (BASKET) ×2
SYSTEM RETRIEVL 5MM INZII UNIV (BASKET) IMPLANT
TOWEL OR 17X26 10 PK STRL BLUE (TOWEL DISPOSABLE) ×2 IMPLANT
TOWEL OR NON WOVEN STRL DISP B (DISPOSABLE) IMPLANT
TROCAR BLADELESS OPT 5 100 (ENDOMECHANICALS) ×2 IMPLANT
TUBING INSUFFLATION 10FT LAP (TUBING) ×2 IMPLANT

## 2022-01-08 NOTE — H&P (Signed)
[facility] Admission Note  Gregg [name] 1956/08/11  [id number].    Requesting MD: Nanda Quinton Chief Complaint/Reason for Consult: gallstones  HPI:  Gregg [name] is a 66yo male PMH HTN, HLD, hyperthyroidism, and anxiety/depression who presented to [facility] this morning complaining of acute onset abdominal pain. States that the pain woke him from sleep last night around 2300. Pain is most severe in his RUQ and radiates across his upper abdomen and into his back. Associated with multiple episodes of nausea and vomiting. Pain is constant and severe. Nothing particularly makes it better or worse. States that he has never had pain like this before.  In the ED patient was found to be hypertensive, otherwise vital signs stable. U/s shows cholelithiasis with upper normal gallbladder wall thickness and minimal edema within gallbladder wall associated with presence of a sonographic Murphy sign suspicious for acute cholecystitis. WBC, LFTs, and lipase WNL. General surgery asked to see.  Patient has had nothing to eat/drink today.  Abdominal surgical history: appendectomy, bilateral inguinal hernia repair Anticoagulants: none Former smoker, quit 5 years ago Drinks alcohol occasionally Denies illicit drug use Employment: retired Lives at home alone NKDA  Review of Systems  Constitutional: Negative.   Respiratory: Negative.    Cardiovascular: Negative.   Gastrointestinal:  Positive for abdominal pain, nausea and vomiting.   All systems reviewed and otherwise negative except for as above  Family History  Problem Relation Age of Onset   Thyroid disease Mother    Thyroid disease Father    Thyroid disease Sister    Stomach cancer Neg Hx    Colon cancer Neg Hx    Esophageal cancer Neg Hx    Rectal cancer Neg Hx     Past Medical History:  Diagnosis Date   Anxiety    Bilateral inguinal hernia 06/02/2021   GERD (gastroesophageal reflux disease)    History of kidney stones 2020    Hypertension    Sinus complaint 06/02/2021   x 1 week per pt   Wears glasses 06/02/2021    Past Surgical History:  Procedure Laterality Date   COLONOSCOPY WITH PROPOFOL N/A 12/31/2021   Procedure: COLONOSCOPY WITH PROPOFOL;  Surgeon: Lavena Bullion, DO;  Location: [facility];  Service: Gastroenterology;  Laterality: N/A;   ENDOSCOPIC MUCOSAL RESECTION N/A 12/31/2021   Procedure: ENDOSCOPIC MUCOSAL RESECTION;  Surgeon: Lavena Bullion, DO;  Location: [facility];  Service: Gastroenterology;  Laterality: N/A;   EXTRACORPOREAL SHOCK WAVE LITHOTRIPSY Left 02/23/2019   Procedure: EXTRACORPOREAL SHOCK WAVE LITHOTRIPSY (ESWL);  Surgeon: Bjorn Loser, MD;  Location: [facility];  Service: Urology;  Laterality: Left;   HEMOSTASIS CLIP PLACEMENT  12/31/2021   Procedure: HEMOSTASIS CLIP PLACEMENT;  Surgeon: Lavena Bullion, DO;  Location: [facility];  Service: Gastroenterology;;   INGUINAL HERNIA REPAIR Bilateral 06/06/2021   Procedure: LAPAROSCOPIC BILATERAL INGUINAL HERNIA REPAIRS WITH MESH;  Surgeon: Michael Boston, MD;  Location: [facility];  Service: General;  Laterality: Bilateral;  TAP BLOCK   LAPAROSCOPIC APPENDECTOMY N/A 03/07/2021   Procedure: APPENDECTOMY LAPAROSCOPIC;  Surgeon: Michael Boston, MD;  Location: [facility];  Service: General;  Laterality: N/A;   SUBMUCOSAL LIFTING INJECTION  12/31/2021   Procedure: SUBMUCOSAL LIFTING INJECTION;  Surgeon: Lavena Bullion, DO;  Location: [facility];  Service: Gastroenterology;;    Social History:  reports that he quit smoking about 4 years ago. His smoking use included cigarettes. He has a 20.00 pack-year smoking history. He has never used smokeless tobacco. He reports current alcohol use.  He reports that he does not use drugs.  Allergies: No Known Allergies  (Not in a hospital admission)   Prior to Admission medications   Medication Sig Start Date End Date Taking? Authorizing Provider  atorvastatin (LIPITOR) 40  MG tablet Take 1 tablet (40 mg total) by mouth daily. 11/25/21 03/25/22 Yes Minette Brine, Amy J, NP  losartan-hydrochlorothiazide (HYZAAR) 50-12.5 MG tablet Take 1 tablet by mouth daily. 11/20/21 02/18/22 Yes Minette Brine, Amy J, NP  sertraline (ZOLOFT) 25 MG tablet Take 1 tablet (25 mg total) by mouth daily. 11/20/21 02/18/22 Yes Minette Brine, Amy J, NP  acetaminophen (TYLENOL) 500 MG tablet Take 1,000 mg by mouth every 6 (six) hours as needed for headache. Patient not taking: Reported on 01/08/2022    [provider]  brompheniramine-pseudoephedrine-DM 30-2-10 MG/5ML syrup Take 5 mLs by mouth 4 (four) times daily as needed. Patient not taking: Reported on 12/22/2021 11/29/21   Gildardo Pounds, NP  fluticasone [facility]) 50 MCG/ACT nasal spray Place 2 sprays into both nostrils daily. Patient taking differently: Place 2 sprays into both nostrils daily as needed. 11/01/21   Gildardo Pounds, NP  levocetirizine (XYZAL) 5 MG tablet Take 5 mg by mouth every evening.    [provider]  methimazole (TAPAZOLE) 5 MG tablet TAKE 0.5 TABLETS (2.5 MG TOTAL) BY MOUTH 3 (THREE) TIMES A WEEK. 12/24/21   Renato Shin, MD  Multiple Vitamins-Minerals [facility] COMPLETE PO) Take by mouth.    [provider]    Blood pressure (!) 155/94, pulse 97, temperature 97.8 F (36.6 C), temperature source Oral, resp. rate 18, height 5\' 9"  (1.753 m), weight 76.2 kg, SpO2 93 %. Physical Exam: General: pleasant, WD/WN male who is laying in bed in NAD HEENT: head is normocephalic, atraumatic.  Sclera are noninjected.  Pupils equal and round.  Ears and nose without any masses or lesions.  Mouth is pink and moist. Dentition fair Heart: regular, rate, and rhythm.  Normal s1,s2. No obvious murmurs, gallops, or rubs noted.  Palpable pedal pulses bilaterally  Lungs: CTAB, no wheezes, rhonchi, or rales noted.  Respiratory effort nonlabored Abd: well healed laparoscopic incisions, soft, mild distension, +BS, no masses, hernias, or  organomegaly. Moderate RUQ TTP with voluntary guarding, mild epigastric tenderness, no peritonitis MS: no BUE/BLE edema, calves soft and nontender Skin: warm and dry with no masses, lesions, or rashes Psych: A&Ox4 with an appropriate affect Neuro: cranial nerves grossly intact, equal strength in BUE/BLE bilaterally, normal speech, thought process intact  Results for orders placed or performed during the hospital encounter of 01/08/22 (from the past 48 hour(s))  Lipase, blood     Status: None   Collection Time: 01/08/22  2:14 AM  Result Value Ref Range   Lipase 47 11 - 51 U/L    Comment: Performed at [facility], [place] [address], [place], [place] [address]  Comprehensive metabolic panel     Status: Abnormal   Collection Time: 01/08/22  2:14 AM  Result Value Ref Range   Sodium 136 135 - 145 mmol/L   Potassium 3.4 (L) 3.5 - 5.1 mmol/L   Chloride 97 (L) 98 - 111 mmol/L   CO2 26 22 - 32 mmol/L   Glucose, Bld 159 (H) 70 - 99 mg/dL    Comment: Glucose reference range applies only to samples taken after fasting for at least 8 hours.   BUN 20 8 - 23 mg/dL   Creatinine, Ser 0.98 0.61 - 1.24 mg/dL   Calcium 10.4 (H) 8.9 -  10.3 mg/dL   Total Protein 8.2 (H) 6.5 - 8.1 g/dL   Albumin 4.6 3.5 - 5.0 g/dL   AST 33 15 - 41 U/L   ALT 43 0 - 44 U/L   Alkaline Phosphatase 74 38 - 126 U/L   Total Bilirubin 0.6 0.3 - 1.2 mg/dL   GFR, Estimated >60 >60 mL/min    Comment: (NOTE) Calculated using the CKD-EPI Creatinine Equation (2021)    Anion gap 13 5 - 15    Comment: Performed at [facility], [place] [address] [place], [place] [address]  CBC     Status: None   Collection Time: 01/08/22  2:14 AM  Result Value Ref Range   WBC 10.2 4.0 - 10.5 K/uL   RBC 5.07 4.22 - 5.81 MIL/uL   Hemoglobin 16.2 13.0 - 17.0 g/dL   HCT 46.2 39.0 - 52.0 %   MCV 91.1 80.0 - 100.0 fL   MCH 32.0 26.0 - 34.0 pg   MCHC 35.1 30.0 - 36.0 g/dL   RDW 12.4 11.5 - 15.5 %   Platelets 273 150  - 400 K/uL   nRBC 0.0 0.0 - 0.2 %    Comment: Performed at [facility], [place] [address], [place], [place] [address]  Troponin I (High Sensitivity)     Status: None   Collection Time: 01/08/22  2:14 AM  Result Value Ref Range   Troponin I (High Sensitivity) 4 <18 ng/L    Comment: (NOTE) Elevated high sensitivity troponin I (hsTnI) values and significant  changes across serial measurements may suggest ACS but many other  chronic and acute conditions are known to elevate hsTnI results.  Refer to the "Links" section for chest pain algorithms and additional  guidance. Performed at [facility], [place] [address] [place], [place] [address]   Urinalysis, Routine w reflex microscopic Urine, Clean Catch     Status: Abnormal   Collection Time: 01/08/22  3:00 AM  Result Value Ref Range   Color, Urine YELLOW YELLOW   APPearance CLOUDY (A) CLEAR   Specific Gravity, Urine 1.015 1.005 - 1.030   pH 8.0 5.0 - 8.0   Glucose, UA NEGATIVE NEGATIVE mg/dL   Hgb urine dipstick NEGATIVE NEGATIVE   Bilirubin Urine NEGATIVE NEGATIVE   Ketones, ur 5 (A) NEGATIVE mg/dL   Protein, ur NEGATIVE NEGATIVE mg/dL   Nitrite NEGATIVE NEGATIVE   Leukocytes,Ua NEGATIVE NEGATIVE    Comment: Performed at [place] [address] [place],  [address]  Resp Panel by RT-PCR (Flu A&B, Covid) Nasopharyngeal Swab     Status: None   Collection Time: 01/08/22  9:10 AM   Specimen: Nasopharyngeal Swab; Nasopharyngeal(NP) swabs in vial transport medium  Result Value Ref Range   SARS Coronavirus 2 by RT PCR NEGATIVE NEGATIVE    Comment: (NOTE) SARS-CoV-2 target nucleic acids are NOT DETECTED.  The SARS-CoV-2 RNA is generally detectable in upper respiratory specimens during the acute phase of infection. The lowest concentration of SARS-CoV-2 viral copies this assay can detect is 138 copies/mL. A negative result does not preclude SARS-Cov-2 infection and should  not be used as the sole basis for treatment or other patient management decisions. A negative result may occur with  improper specimen collection/handling, submission of specimen other than nasopharyngeal swab, presence of viral mutation(s) within the areas targeted by this assay, and inadequate number of viral copies(<138 copies/mL). A negative result must be combined with clinical observations, patient history, and epidemiological information.  The expected result is Negative.  Fact Sheet for Patients:  EntrepreneurPulse.com.au  Fact Sheet for Healthcare Providers:  IncredibleEmployment.be  This test is no t yet approved or cleared by the [place] FDA and  has been authorized for detection and/or diagnosis of SARS-CoV-2 by FDA under an Emergency Use Authorization (EUA). This EUA will remain  in effect (meaning this test can be used) for the duration of the COVID-19 declaration under Section 564(b)(1) of the Act, 21 U.S.C.section 360bbb-3(b)(1), unless the authorization is terminated  or revoked sooner.       Influenza A by PCR NEGATIVE NEGATIVE   Influenza B by PCR NEGATIVE NEGATIVE    Comment: (NOTE) The Xpert Xpress SARS-CoV-2/FLU/RSV plus assay is intended as an aid in the diagnosis of influenza from Nasopharyngeal swab specimens and should not be used as a sole basis for treatment. Nasal washings and aspirates are unacceptable for Xpert Xpress SARS-CoV-2/FLU/RSV testing.  Fact Sheet for Patients: EntrepreneurPulse.com.au  Fact Sheet for Healthcare Providers: IncredibleEmployment.be  This test is not yet approved or cleared by the [place] FDA and has been authorized for detection and/or diagnosis of SARS-CoV-2 by FDA under an Emergency Use Authorization (EUA). This EUA will remain in effect (meaning this test can be used) for the duration of the COVID-19 declaration under Section 564(b)(1)  of the Act, 21 U.S.C. section 360bbb-3(b)(1), unless the authorization is terminated or revoked.  Performed at [facility], [place] [address] [place], [place] [address]    CT ABDOMEN PELVIS W CONTRAST  Result Date: 01/08/2022 CLINICAL DATA:  Abdominal pain, acute. EXAM: CT ABDOMEN AND PELVIS WITH CONTRAST TECHNIQUE: Multidetector CT imaging of the abdomen and pelvis was performed using the standard protocol following bolus administration of intravenous contrast. RADIATION DOSE REDUCTION: This exam was performed according to the departmental dose-optimization program which includes automated exposure control, adjustment of the mA and/or kV according to patient size and/or use of iterative reconstruction technique. CONTRAST:  152mL OMNIPAQUE IOHEXOL 300 MG/ML  SOLN COMPARISON:  03/07/2021 FINDINGS: Lower chest: No acute abnormality. Hepatobiliary: Normal appearance of the liver. There are numerous calcified stones within the gallbladder measuring up to 1 cm. No CT findings of gallbladder wall thickening or pericholecystic fluid. No signs of bile duct dilatation. Pancreas: Unremarkable. No pancreatic ductal dilatation or surrounding inflammatory changes. Spleen: Normal in size without focal abnormality. Adrenals/Urinary Tract: Normal appearance of the adrenal glands. Simple appearing cyst arises off the posterior cortex of the inferior pole of left kidney measuring 1.8 cm, image 36/2. Small densely calcified lesion is also noted arising off the upper pole the left kidney measuring 1.1 cm. Too small to characterize hypodense lesion within the medial cortex of the interpolar right kidney measures 5 mm. No signs of nephrolithiasis or hydronephrosis. Urinary bladder is unremarkable. Stomach/Bowel: There is mild circumferential wall thickening involving the distal esophagus just above the GE junction. There is an adjacent lymph node within the posterior mediastinum measuring 0.6 cm, image 9/2 the  stomach appears nondistended. Postsurgical changes within the right lower quadrant of the abdomen are identified compatible with previous appendectomy. There is no pathologic dilatation of the large or small bowel loops. Linear metallic foreign body within the distal transverse colon is identified which may reflect Endoclip status post recent colonoscopy. Vascular/Lymphatic: Aortic atherosclerosis. No abdominopelvic adenopathy identified. Reproductive: Prostate is unremarkable. Other: Status post bilateral inguinal hernia repair. No free fluid or fluid collections within the abdomen or pelvis. Musculoskeletal: No acute or significant osseous findings. IMPRESSION: 1.  No acute findings identified within the abdomen or pelvis. 2. There is mild circumferential wall thickening involving the distal esophagus just above the GE junction with an adjacent posterior mediastinal lymph node. Findings are nonspecific and may reflect esophagitis. Underlying esophageal neoplasm is not excluded. Consider further evaluation with endoscopy. 3. Linear metallic foreign body within the distal transverse colon may reflect Endoclip status post recent colonoscopy. 4. Gallstones. If there is a clinical concern for acute cholecystitis consider further investigation with right upper quadrant sonogram. 5. Aortic Atherosclerosis (ICD10-I70.0). Electronically Signed   By: Kerby Moors M.D.   On: 01/08/2022 06:57   US Abdomen Limited RUQ (LIVER/GB)  Result Date: 01/08/2022 CLINICAL DATA:  Upper abdominal pain, R 10.9, R 10.10 EXAM: ULTRASOUND ABDOMEN LIMITED RIGHT UPPER QUADRANT COMPARISON:  None; correlation CT abdomen and pelvis 01/08/2022 FINDINGS: Gallbladder: Multiple shadowing calculi within gallbladder question up to 12 mm diameter. Upper normal gallbladder wall thickness. Minimal edema within gallbladder wall. Sonographic Percell Miller sign is present. Common bile duct: Diameter: 6 mm, normal for age Liver: Normal echogenicity without mass  or nodularity. No intrahepatic biliary dilatation. Portal vein is patent on color Doppler imaging with normal direction of blood flow towards the liver. Other: No RIGHT upper quadrant free fluid. IMPRESSION: Cholelithiasis with upper normal gallbladder wall thickness and minimal edema within gallbladder wall associated with presence of a sonographic Murphy sign suspicious for acute cholecystitis. No biliary dilatation. Electronically Signed   By: Lavonia Dana M.D.   On: 01/08/2022 08:59      Assessment/Plan Symptomatic cholelithiasis, suspect early acute cholecystitis - U/s shows cholelithiasis with upper normal gallbladder wall thickness and minimal edema within gallbladder wall associated with presence of a sonographic Murphy sign suspicious for acute cholecystitis - WBC, LFTs, and lipase WNL - I have explained the procedure, risks, and aftercare of cholecystectomy.  Risks include but are not limited to bleeding, infection, wound problems, anesthesia, diarrhea, bile leak, injury to common bile duct/liver/intestine.  He seems to understand and agrees to proceed.  Will start rocephin. Keep NPO. Plan for surgery later today pending OR availability.   ID - rocephin VTE - SCDs, lovenox postop FEN - IVF, NPO Foley - none  HTN HLD Hyperthyroidism Anxiety/depression ?Esophagitis - CT shows mild circumferential wall thickening involving the distal esophagus just above the GE junction, findings are nonspecific and may reflect esophagitis but underlying esophageal neoplasm is not excluded - this was discussed with the patient and he will follow up with PCP/GI for further work up  [place], [facility] Surgery 01/08/2022, 10:04 AM Please see Amion for pager number during day hours 7:00am-4:30pm

## 2022-01-08 NOTE — ED Provider Notes (Signed)
[facility] DEPT Provider Note  CSN: [id number] Arrival date & time: 01/08/22 0132  Chief Complaint(s) Abdominal Pain and Back Pain  HPI Gregg [name] is a 66 y.o. male with a past medical history listed below including hypertension, prior renal stones, colonic polyp status post polypectomy 1 week ago who presents to the emergency department with sudden onset right upper quadrant abdominal pain that began around 11 PM last night.  Pain has been constant since onset.  Associated with nausea and an episode of emesis.  No known fevers or chills.  No chest pain or shortness of breath.  Patient still having bowel movements and passing gas.  Patient reports that pain is not similar to his prior renal stones.  HPI  Past Medical History Past Medical History:  Diagnosis Date   Anxiety    Bilateral inguinal hernia 06/02/2021   GERD (gastroesophageal reflux disease)    History of kidney stones 2020   Hypertension    Sinus complaint 06/02/2021   x 1 week per pt   Wears glasses 06/02/2021   Patient Active Problem List   Diagnosis Date Noted   Adenomatous polyp of transverse colon    Diverticulosis of colon without hemorrhage    Grade II internal hemorrhoids    Multinodular goiter 12/01/2021   Hyperlipidemia 09/11/2021   Essential hypertension 07/22/2021   Anxiety and depression 07/02/2021   History of appendicitis 07/01/2021   Acute phlegmonous appendicitis s/p lap appendectomy 03/07/2021 03/07/2021   Kidney stone 03/07/2021   Bilateral inguinal hernia (BIH) s/p lap repair with mesh 06/06/2021 03/07/2021   Home Medication(s) Prior to Admission medications   Medication Sig Start Date End Date Taking? Authorizing Provider  atorvastatin (LIPITOR) 40 MG tablet Take 1 tablet (40 mg total) by mouth daily. 11/25/21 03/25/22 Yes Minette Brine, Amy J, NP  losartan-hydrochlorothiazide (HYZAAR) 50-12.5 MG tablet Take 1 tablet by mouth daily. 11/20/21 02/18/22 Yes Minette Brine, Amy J,  NP  sertraline (ZOLOFT) 25 MG tablet Take 1 tablet (25 mg total) by mouth daily. 11/20/21 02/18/22 Yes Minette Brine, Amy J, NP  acetaminophen (TYLENOL) 500 MG tablet Take 1,000 mg by mouth every 6 (six) hours as needed for headache. Patient not taking: Reported on 01/08/2022    [provider]  brompheniramine-pseudoephedrine-DM 30-2-10 MG/5ML syrup Take 5 mLs by mouth 4 (four) times daily as needed. Patient not taking: Reported on 12/22/2021 11/29/21   Gildardo Pounds, NP  fluticasone [facility]) 50 MCG/ACT nasal spray Place 2 sprays into both nostrils daily. Patient taking differently: Place 2 sprays into both nostrils daily as needed. 11/01/21   Gildardo Pounds, NP  levocetirizine (XYZAL) 5 MG tablet Take 5 mg by mouth every evening.    [provider]  methimazole (TAPAZOLE) 5 MG tablet TAKE 0.5 TABLETS (2.5 MG TOTAL) BY MOUTH 3 (THREE) TIMES A WEEK. 12/24/21   Renato Shin, MD  Multiple Vitamins-Minerals [facility] COMPLETE PO) Take by mouth.    [provider]  Allergies Patient has no known allergies.  Review of Systems Review of Systems As noted in HPI  Physical Exam Vital Signs  I have reviewed the triage vital signs BP (!) 160/97   Pulse 93    Temp 97.8 F (36.6 C) (Oral)    Resp 20    Ht 5\' 9"  (1.753 m)    Wt 76.2 kg    SpO2 99%    BMI 24.81 kg/m   Physical Exam Vitals reviewed.  Constitutional:      General: He is not in acute distress.    Appearance: He is well-developed. He is not diaphoretic.  HENT:     Head: Normocephalic and atraumatic.     Right Ear: External ear normal.     Left Ear: External ear normal.     Nose: Nose normal.     Mouth/Throat:     Mouth: Mucous membranes are moist.  Eyes:     General: No scleral icterus.    Conjunctiva/sclera: Conjunctivae normal.  Neck:     Trachea: Phonation normal.   Cardiovascular:     Rate and Rhythm: Normal rate and regular rhythm.  Pulmonary:     Effort: Pulmonary effort is normal. No respiratory distress.     Breath sounds: No stridor.  Abdominal:     General: There is no distension.     Tenderness: There is abdominal tenderness in the right upper quadrant. There is no guarding or rebound.  Musculoskeletal:        General: Normal range of motion.     Cervical back: Normal range of motion.  Neurological:     Mental Status: He is alert and oriented to person, place, and time.  Psychiatric:        Behavior: Behavior normal.    ED Results and Treatments Labs (all labs ordered are listed, but only abnormal results are displayed) Labs Reviewed  COMPREHENSIVE METABOLIC PANEL - Abnormal; Notable for the following components:      Result Value   Potassium 3.4 (*)    Chloride 97 (*)    Glucose, Bld 159 (*)    Calcium 10.4 (*)    Total Protein 8.2 (*)    All other components within normal limits  URINALYSIS, ROUTINE W REFLEX MICROSCOPIC - Abnormal; Notable for the following components:   APPearance CLOUDY (*)    Ketones, ur 5 (*)    All other components within normal limits  LIPASE, BLOOD  CBC  TROPONIN I (HIGH SENSITIVITY)                                                                                                                         EKG  EKG Interpretation  Date/Time:  Thursday January 08 2022 02:19:59 EST Ventricular Rate:  91 PR Interval:  145 QRS Duration: 127 QT Interval:  387 QTC Calculation: 477 R Axis:   89 Text Interpretation: Sinus rhythm LAE, consider biatrial enlargement Right bundle branch block No old tracing to compare Confirmed  by Addison Lank [phone]) on 01/08/2022 5:26:51 AM       Radiology CT ABDOMEN PELVIS W CONTRAST  Result Date: 01/08/2022 CLINICAL DATA:  Abdominal pain, acute. EXAM: CT ABDOMEN AND PELVIS WITH CONTRAST TECHNIQUE: Multidetector CT imaging of the abdomen and pelvis was performed using  the standard protocol following bolus administration of intravenous contrast. RADIATION DOSE REDUCTION: This exam was performed according to the departmental dose-optimization program which includes automated exposure control, adjustment of the mA and/or kV according to patient size and/or use of iterative reconstruction technique. CONTRAST:  176mL OMNIPAQUE IOHEXOL 300 MG/ML  SOLN COMPARISON:  03/07/2021 FINDINGS: Lower chest: No acute abnormality. Hepatobiliary: Normal appearance of the liver. There are numerous calcified stones within the gallbladder measuring up to 1 cm. No CT findings of gallbladder wall thickening or pericholecystic fluid. No signs of bile duct dilatation. Pancreas: Unremarkable. No pancreatic ductal dilatation or surrounding inflammatory changes. Spleen: Normal in size without focal abnormality. Adrenals/Urinary Tract: Normal appearance of the adrenal glands. Simple appearing cyst arises off the posterior cortex of the inferior pole of left kidney measuring 1.8 cm, image 36/2. Small densely calcified lesion is also noted arising off the upper pole the left kidney measuring 1.1 cm. Too small to characterize hypodense lesion within the medial cortex of the interpolar right kidney measures 5 mm. No signs of nephrolithiasis or hydronephrosis. Urinary bladder is unremarkable. Stomach/Bowel: There is mild circumferential wall thickening involving the distal esophagus just above the GE junction. There is an adjacent lymph node within the posterior mediastinum measuring 0.6 cm, image 9/2 the stomach appears nondistended. Postsurgical changes within the right lower quadrant of the abdomen are identified compatible with previous appendectomy. There is no pathologic dilatation of the large or small bowel loops. Linear metallic foreign body within the distal transverse colon is identified which may reflect Endoclip status post recent colonoscopy. Vascular/Lymphatic: Aortic atherosclerosis. No  abdominopelvic adenopathy identified. Reproductive: Prostate is unremarkable. Other: Status post bilateral inguinal hernia repair. No free fluid or fluid collections within the abdomen or pelvis. Musculoskeletal: No acute or significant osseous findings. IMPRESSION: 1. No acute findings identified within the abdomen or pelvis. 2. There is mild circumferential wall thickening involving the distal esophagus just above the GE junction with an adjacent posterior mediastinal lymph node. Findings are nonspecific and may reflect esophagitis. Underlying esophageal neoplasm is not excluded. Consider further evaluation with endoscopy. 3. Linear metallic foreign body within the distal transverse colon may reflect Endoclip status post recent colonoscopy. 4. Gallstones. If there is a clinical concern for acute cholecystitis consider further investigation with right upper quadrant sonogram. 5. Aortic Atherosclerosis (ICD10-I70.0). Electronically Signed   By: Kerby Moors M.D.   On: 01/08/2022 06:57    Pertinent labs & imaging results that were available during my care of the patient were reviewed by me and considered in my medical decision making (see MDM for details).  Medications Ordered in ED Medications  ondansetron (ZOFRAN) injection 4 mg (4 mg Intravenous Given 01/08/22 0553)  sodium chloride 0.9 % bolus 1,000 mL (0 mLs Intravenous Stopped 01/08/22 0659)  fentaNYL (SUBLIMAZE) injection 50 mcg (50 mcg Intravenous Given 01/08/22 0554)  iohexol (OMNIPAQUE) 300 MG/ML solution 100 mL (100 mLs Intravenous Contrast Given 01/08/22 0624)  sodium chloride (PF) 0.9 % injection (  Given by Other 01/08/22 0659)  fentaNYL (SUBLIMAZE) injection 50 mcg (50 mcg Intravenous Given 01/08/22 0658)  Procedures Procedures  (including critical care time)  Medical Decision Making / ED Course         RUQ pain Not consistent with his prior renal colic. Patient had recent transverse colon polypectomy. Will assess for any biliary disease, pancreatitis.  We will also assess for any complication from recent procedure.  Work-up ordered to assess concerns above.  Labs and imaging independently interpreted by me and noted below: CBC without leukocytosis or anemia No significant electrolyte derangements or renal sufficiency noted on metabolic panel.  No evidence of bili obstruction or pancreatitis. UA without evidence of infection. CT notable for cholelithiasis.  No obvious wall thickening or pericholecystic fluid.  No evidence of free peritoneal air concerning for bowel perforation from recent procedure.  No obvious inflammatory/infectious process noted.  Radiology confirm but did note wall thickening in the distal esophagus.  Management: Patient given IV fluids, IV antiemetics and IV pain medicine.  Reassessment: Pain slightly improved required additional pain medicine        Assessment/Plan:                                                                                                                                              RUQ pain  Work-up thus far has been reassuring without significant intra-abdominal inflammatory/infectious process but patient does have cholelithiasis on CT scan.  Will obtain right upper quadrant ultrasound for better assessment. Patient care turned over to oncoming provider. Patient case and results discussed in detail; please see their note for further ED managment.   Final Clinical Impression(s) / ED Diagnoses Final diagnoses:  RUQ pain           This chart was dictated using voice recognition software.  Despite best efforts to proofread,  errors can occur which can change the documentation meaning.    Fatima Blank, MD 01/08/22 [phone]

## 2022-01-08 NOTE — ED Notes (Signed)
Pt reports last PO intake around 2130 1/18. Last drink 0330 1/19, 4oz water.

## 2022-01-08 NOTE — Discharge Instructions (Signed)
[facility]  Please arrive at least 30 min before your appointment to complete your check in paperwork.  If you are unable to arrive 30 min prior to your appointment time we may have to cancel or reschedule you. LAPAROSCOPIC SURGERY: POST OP INSTRUCTIONS Always review your discharge instruction sheet given to you by the facility where your surgery was performed. IF YOU HAVE DISABILITY OR FAMILY LEAVE FORMS, YOU MUST BRING THEM TO THE OFFICE FOR PROCESSING.   DO NOT GIVE THEM TO YOUR DOCTOR.  PAIN CONTROL  First take acetaminophen (Tylenol) AND/or ibuprofen (Advil) to control your pain after surgery.  Follow directions on package.  Taking acetaminophen (Tylenol) and/or ibuprofen (Advil) regularly after surgery will help to control your pain and lower the amount of prescription pain medication you may need.  You should not take more than 4,000 mg (4 grams) of acetaminophen (Tylenol) in 24 hours.  You should not take ibuprofen (Advil), aleve, motrin, naprosyn or other NSAIDS if you have a history of stomach ulcers or chronic kidney disease.  A prescription for pain medication may be given to you upon discharge.  Take your pain medication as prescribed, if you still have uncontrolled pain after taking acetaminophen (Tylenol) or ibuprofen (Advil). Use ice packs to help control pain. If you need a refill on your pain medication, please contact your pharmacy.  They will contact our office to request authorization. Prescriptions will not be filled after 5pm or on week-ends.  HOME MEDICATIONS Take your usually prescribed medications unless otherwise directed.  DIET You should follow a light diet the first few days after arrival home.  Be sure to include lots of fluids daily. Avoid fatty, fried foods.   CONSTIPATION It is common to experience some constipation after surgery and if you are taking pain medication.  Increasing fluid intake and taking a stool softener (such as Colace)  will usually help or prevent this problem from occurring.  A mild laxative (Milk of Magnesia or Miralax) should be taken according to package instructions if there are no bowel movements after 48 hours.  WOUND/INCISION CARE Most patients will experience some swelling and bruising in the area of the incisions.  Ice packs will help.  Swelling and bruising can take several days to resolve.  Unless discharge instructions indicate otherwise, follow guidelines below  STERI-STRIPS - you may remove your outer bandages 48 hours after surgery, and you may shower at that time.  You have steri-strips (small skin tapes) in place directly over the incision.  These strips should be left on the skin for 7-10 days.   DERMABOND/SKIN GLUE - you may shower in 24 hours.  The glue will flake off over the next 2-3 weeks. Any sutures or staples will be removed at the office during your follow-up visit.  ACTIVITIES You may resume regular (light) daily activities beginning the next day--such as daily self-care, walking, climbing stairs--gradually increasing activities as tolerated.  You may have sexual intercourse when it is comfortable.  Refrain from any heavy lifting or straining until approved by your doctor. You may drive when you are no longer taking prescription pain medication, you can comfortably wear a seatbelt, and you can safely maneuver your car and apply brakes.  FOLLOW-UP You should see your doctor in the office for a follow-up appointment approximately 2-3 weeks after your surgery.  You should have been given your post-op/follow-up appointment when your surgery was scheduled.  If you did not receive a post-op/follow-up appointment, make sure   that you call for this appointment within a day or two after you arrive home to insure a convenient appointment time.  OTHER INSTRUCTIONS  WHEN TO CALL YOUR DOCTOR: Fever over 101.0 Inability to urinate Continued bleeding from incision. Increased pain, redness, or  drainage from the incision. Increasing abdominal pain  The clinic staff is available to answer your questions during regular business hours.  Please don't hesitate to call and ask to speak to one of the nurses for clinical concerns.  If you have a medical emergency, go to the nearest emergency room or call 911.  A surgeon from [facility] is always on call at the hospital. [address], [address], [place], [place]  [address] ? [address] [address], [place], [place]   [address] [phone] ? [phone] ? FAX [phone]   

## 2022-01-08 NOTE — Anesthesia Preprocedure Evaluation (Signed)
Anesthesia Evaluation  Patient identified by MRN, date of birth, ID band Patient awake    Reviewed: Allergy & Precautions, NPO status , Patient's Chart, lab work & pertinent test results  Airway Mallampati: II  TM Distance: >3 FB Neck ROM: Full    Dental no notable dental hx. (+) Dental Advisory Given, Edentulous Upper   Pulmonary former smoker,    Pulmonary exam normal breath sounds clear to auscultation       Cardiovascular hypertension, Pt. on medications and Pt. on home beta blockers Normal cardiovascular exam Rhythm:Regular Rate:Normal     Neuro/Psych Anxiety negative neurological ROS     GI/Hepatic GERD  ,  Endo/Other  Hyperthyroidism   Renal/GU Lab Results      Component                Value               Date                      CREATININE               0.98                01/08/2022                 NA                       136                 01/08/2022                K                        3.4 (L)             01/08/2022               Musculoskeletal negative musculoskeletal ROS (+)   Abdominal   Peds  Hematology Lab Results      Component                Value               Date                            HGB                      16.2                01/08/2022                HCT                      46.2                01/08/2022                       PLT                      273                 01/08/2022              Anesthesia Other Findings   Reproductive/Obstetrics  Anesthesia Physical Anesthesia Plan  ASA: 2  Anesthesia Plan: General   Post-op Pain Management:    Induction: Intravenous  PONV Risk Score and Plan: 3 and Midazolam, Ondansetron and Propofol infusion  Airway Management Planned: Oral ETT  Additional Equipment: None  Intra-op Plan:   Post-operative Plan: Extubation in OR  Informed Consent: I have reviewed the  patients History and Physical, chart, labs and discussed the procedure including the risks, benefits and alternatives for the proposed anesthesia with the patient or authorized representative who has indicated his/her understanding and acceptance.     Dental advisory given  Plan Discussed with:   Anesthesia Plan Comments:         Anesthesia Quick Evaluation

## 2022-01-08 NOTE — ED Triage Notes (Signed)
Patient arrives from home with complaint of epigastric abdominal pain that radiates to his back. Pt states pain woke him from his sleep, endorses nausea.

## 2022-01-08 NOTE — ED Notes (Signed)
Ultrasound at bedside

## 2022-01-08 NOTE — ED Provider Notes (Signed)
Blood pressure (!) 160/97, pulse 93, temperature 97.8 F (36.6 C), temperature source Oral, resp. rate 20, height 5\' 9"  (1.753 m), weight 76.2 kg, SpO2 100 %.  Assuming care from Dr. Leonette Monarch.  In short, Gregg [name] is a 66 y.o. male with a chief complaint of Abdominal Pain and Back Pain .  Refer to the original H&P for additional details.  The current plan of care is to abdominal pain.  09:05 AM  Patient's right upper quadrant ultrasound showing cholelithiasis with gallbladder wall thickness and some mild edema.  The document a sonographic Murphy sign with suspicion for acute cholecystitis.  On my exam the patient is tender in the right upper quadrant.  Pain medications given here have had little effect on his pain.  We will discuss the case with general surgery.     Margette Fast, MD 01/14/22 1958

## 2022-01-08 NOTE — Transfer of Care (Signed)
Immediate Anesthesia Transfer of Care Note  Patient: [name]  Procedure(s) Performed: XI ROBOTIC ASSISTED LAPAROSCOPIC CHOLECYSTECTOMY (Abdomen)  Patient Location: PACU  Anesthesia Type:General  Level of Consciousness: awake  Airway & Oxygen Therapy: Patient Spontanous Breathing and Patient connected to face mask oxygen  Post-op Assessment: Report given to RN and Post -op Vital signs reviewed and stable  Post vital signs: Reviewed and stable  Last Vitals:  Vitals Value Taken Time  BP 126/72 01/08/22 1656  Temp    Pulse 91 01/08/22 1658  Resp 18 01/08/22 1658  SpO2 91 % 01/08/22 1658  Vitals shown include unvalidated device data.  Last Pain:  Vitals:   01/08/22 1313  TempSrc: Oral  PainSc:          Complications: No notable events documented.

## 2022-01-08 NOTE — Anesthesia Procedure Notes (Signed)
Procedure Name: Intubation Date/Time: 01/08/2022 3:03 PM Performed by: Milford Cage, CRNA Pre-anesthesia Checklist: Patient identified, Emergency Drugs available, Suction available and Patient being monitored Patient Re-evaluated:Patient Re-evaluated prior to induction Oxygen Delivery Method: Circle system utilized Preoxygenation: Pre-oxygenation with 100% oxygen Induction Type: IV induction Ventilation: Mask ventilation without difficulty Laryngoscope Size: Miller and 2 Grade View: Grade I Tube type: Oral Tube size: 7.5 mm Number of attempts: 1 Airway Equipment and Method: Stylet Placement Confirmation: ETT inserted through vocal cords under direct vision, positive ETCO2 and breath sounds checked- equal and bilateral Secured at: 22 cm Tube secured with: Tape Dental Injury: Teeth and Oropharynx as per pre-operative assessment

## 2022-01-08 NOTE — Plan of Care (Signed)
Problem: Education: Goal: Knowledge of General Education information will improve Description: Including pain rating scale, medication(s)/side effects and non-pharmacologic comfort measures Outcome: Progressing   Problem: Health Behavior/Discharge Planning: Goal: Ability to manage health-related needs will improve Outcome: Progressing   Problem: Clinical Measurements: Goal: Respiratory complications will improve Outcome: Progressing   Problem: Clinical Measurements: Goal: Cardiovascular complication will be avoided Outcome: [place], RN 01/08/22 8:01 PM

## 2022-01-08 NOTE — Op Note (Signed)
Gregg [name]  Primary Care Physician:  Camillia Herter, NP    01/08/2022  4:48 PM  Procedure: Robotic cholecystectomy with ICG  Surgeon: Catalina Antigua B. Hassell Done, MD, FACS Asst:  none  Anes:  General  Drains:  None  Findings: Acute cholecystitis  Description of Procedure: The patient was taken to OR 5 and given general anesthesia.  The patient was prepped with chlorohexidine prep and draped sterilely. A time out was performed including identifying the patient and discussing their procedure.  Access to the abdomen was achieved with a 5 mm Optiview through the left upper quadrant.  Port placement included  four 8 mm robotic trocars and one 5 mm trocar.    The gallbladder was visualized and appeared acutely inflamed and edematous.   The fundus of the gallbaldder was grasped and the gallbladder was elevated. Traction on the infundibulum allowed for successful demonstration of the critical view. Inflammatory changes were acute and calot triangle was dissected free revealing the cystic artery and the cystic duct.  Firefly was used to corroborate the gross physical findings.      The cystic duct was then double clipped and divided, the cystic artery was double clipped and divided and then the gallbladder was removed from the gallbladder bed. Removal of the gallbladder from the gallbladder bed was uneventful and no bleeding or bile leaks were seen.  The gallbladder was then placed in a bag and brought out through one of the trocar sites. The gallbladder bed was inspected and no bleeding or bile leaks were seen.   Laparoscopic visualization was used when closing fascial defects for trocar sites.   Incisions were injected with Exparel and closed with 4-0 Monocryl and Dermbond on the skin.  Sponge and needle count were correct.    The patient was taken to the recovery room in satisfactory condition.

## 2022-01-08 NOTE — Interval H&P Note (Signed)
History and Physical Interval Note:  01/08/2022 2:18 PM  Gregg [name]  has presented today for surgery, with the diagnosis of SYMTOMATIC CHOLELITHIASIS.  The various methods of treatment have been discussed with the patient and family. After consideration of risks, benefits and other options for treatment, the patient has consented to  Procedure(s): XI ROBOTIC [place] (N/A) as a surgical intervention.  The patient's history has been reviewed, patient examined, no change in status, stable for surgery.  I have reviewed the patient's chart and labs.  Questions were answered to the patient's satisfaction.     [name]

## 2022-01-09 MED ORDER — ACETAMINOPHEN 500 MG PO TABS: 500.0000 mg | ORAL_TABLET | Freq: Four times a day (QID) | ORAL | 2 refills | Status: DC | PRN

## 2022-01-09 MED ORDER — HYDROCODONE-ACETAMINOPHEN 5-325 MG PO TABS: 1.0000 | ORAL_TABLET | Freq: Four times a day (QID) | ORAL | 0 refills | Status: DC | PRN

## 2022-01-09 NOTE — Discharge Summary (Signed)
[facility] Discharge Summary   Patient ID: Gregg [name] MRN: [id number] DOB/AGE: May 11, 1956 66 y.o.  Admit date: 01/08/2022 Discharge date: 01/09/2022  Admitting Diagnosis: Cholecystitis   Discharge Diagnosis Patient Active Problem List   Diagnosis Date Noted   Symptomatic cholelithiasis 01/08/2022   S/P laparoscopic cholecystectomy 01/08/2022   Adenomatous polyp of transverse colon    Diverticulosis of colon without hemorrhage    Grade II internal hemorrhoids    Multinodular goiter 12/01/2021   Hyperlipidemia 09/11/2021   Essential hypertension 07/22/2021   Anxiety and depression 07/02/2021   History of appendicitis 07/01/2021   Acute phlegmonous appendicitis s/p lap appendectomy 03/07/2021 03/07/2021   Kidney stone 03/07/2021   Bilateral inguinal hernia (BIH) s/p lap repair with mesh 06/06/2021 03/07/2021    Consultants None   Imaging: CT ABDOMEN PELVIS W CONTRAST  Result Date: 01/08/2022 CLINICAL DATA:  Abdominal pain, acute. EXAM: CT ABDOMEN AND PELVIS WITH CONTRAST TECHNIQUE: Multidetector CT imaging of the abdomen and pelvis was performed using the standard protocol following bolus administration of intravenous contrast. RADIATION DOSE REDUCTION: This exam was performed according to the departmental dose-optimization program which includes automated exposure control, adjustment of the mA and/or kV according to patient size and/or use of iterative reconstruction technique. CONTRAST:  198mL OMNIPAQUE IOHEXOL 300 MG/ML  SOLN COMPARISON:  03/07/2021 FINDINGS: Lower chest: No acute abnormality. Hepatobiliary: Normal appearance of the liver. There are numerous calcified stones within the gallbladder measuring up to 1 cm. No CT findings of gallbladder wall thickening or pericholecystic fluid. No signs of bile duct dilatation. Pancreas: Unremarkable. No pancreatic ductal dilatation or surrounding inflammatory changes. Spleen: Normal in size without focal abnormality.  Adrenals/Urinary Tract: Normal appearance of the adrenal glands. Simple appearing cyst arises off the posterior cortex of the inferior pole of left kidney measuring 1.8 cm, image 36/2. Small densely calcified lesion is also noted arising off the upper pole the left kidney measuring 1.1 cm. Too small to characterize hypodense lesion within the medial cortex of the interpolar right kidney measures 5 mm. No signs of nephrolithiasis or hydronephrosis. Urinary bladder is unremarkable. Stomach/Bowel: There is mild circumferential wall thickening involving the distal esophagus just above the GE junction. There is an adjacent lymph node within the posterior mediastinum measuring 0.6 cm, image 9/2 the stomach appears nondistended. Postsurgical changes within the right lower quadrant of the abdomen are identified compatible with previous appendectomy. There is no pathologic dilatation of the large or small bowel loops. Linear metallic foreign body within the distal transverse colon is identified which may reflect Endoclip status post recent colonoscopy. Vascular/Lymphatic: Aortic atherosclerosis. No abdominopelvic adenopathy identified. Reproductive: Prostate is unremarkable. Other: Status post bilateral inguinal hernia repair. No free fluid or fluid collections within the abdomen or pelvis. Musculoskeletal: No acute or significant osseous findings. IMPRESSION: 1. No acute findings identified within the abdomen or pelvis. 2. There is mild circumferential wall thickening involving the distal esophagus just above the GE junction with an adjacent posterior mediastinal lymph node. Findings are nonspecific and may reflect esophagitis. Underlying esophageal neoplasm is not excluded. Consider further evaluation with endoscopy. 3. Linear metallic foreign body within the distal transverse colon may reflect Endoclip status post recent colonoscopy. 4. Gallstones. If there is a clinical concern for acute cholecystitis consider further  investigation with right upper quadrant sonogram. 5. Aortic Atherosclerosis (ICD10-I70.0). Electronically Signed   By: Kerby Moors M.D.   On: 01/08/2022 06:57   US Abdomen Limited RUQ (LIVER/GB)  Result Date: 01/08/2022 CLINICAL DATA:  Upper abdominal  pain, R 10.9, R 10.10 EXAM: ULTRASOUND ABDOMEN LIMITED RIGHT UPPER QUADRANT COMPARISON:  None; correlation CT abdomen and pelvis 01/08/2022 FINDINGS: Gallbladder: Multiple shadowing calculi within gallbladder question up to 12 mm diameter. Upper normal gallbladder wall thickness. Minimal edema within gallbladder wall. Sonographic Percell Miller sign is present. Common bile duct: Diameter: 6 mm, normal for age Liver: Normal echogenicity without mass or nodularity. No intrahepatic biliary dilatation. Portal vein is patent on color Doppler imaging with normal direction of blood flow towards the liver. Other: No RIGHT upper quadrant free fluid. IMPRESSION: Cholelithiasis with upper normal gallbladder wall thickness and minimal edema within gallbladder wall associated with presence of a sonographic Murphy sign suspicious for acute cholecystitis. No biliary dilatation. Electronically Signed   By: Lavonia Dana M.D.   On: 01/08/2022 08:59    Procedures Dr. Johnathan Hausen 01/08/21 - Robotic Cholecystectomy with ICG  Hospital Course:  66 y/o M who presented to [facility] with abdominal pain, nausea, vomiting.  Workup showed cholecystitis.  Patient was admitted and underwent procedure listed above.  Tolerated procedure well and was transferred to the floor.  Diet was advanced as tolerated.  On POD#1, the patient was voiding well, tolerating diet, ambulating well, pain well controlled, vital signs stable, incisions c/d/i and felt stable for discharge home.  Patient will follow up in our office in 2 weeks and knows to call with questions or concerns.  Of note, he had esophageal abnormality on his CT scan (see above) and was advised to discuss with his GI Physician, Dr. Bryan Lemma.    Physical Exam: General:  Alert, NAD, pleasant, comfortable Abd:  Soft, ND, mild tenderness, incisions C/D/I  Allergies as of 01/09/2022   No Known Allergies      Medication List     TAKE these medications    acetaminophen 500 MG tablet Commonly known as: TYLENOL Take 1 tablet (500 mg total) by mouth every 6 (six) hours as needed for mild pain or moderate pain. Notes to patient: Last dose given 01/19 03:24pm   atorvastatin 40 MG tablet Commonly known as: LIPITOR Take 1 tablet (40 mg total) by mouth daily. Notes to patient: Resume home regimen   HYDROcodone-acetaminophen 5-325 MG tablet Commonly known as: NORCO/VICODIN Take 1 tablet by mouth every 6 (six) hours as needed for moderate pain (pain not releived by tylenol). Notes to patient: Last dose given 01/20 02:39am   levocetirizine 5 MG tablet Commonly known as: XYZAL Take 5 mg by mouth every evening. Notes to patient: Resume home regimen   losartan-hydrochlorothiazide 50-12.5 MG tablet Commonly known as: HYZAAR Take 1 tablet by mouth daily. Notes to patient: Resume home regimen   methimazole 5 MG tablet Commonly known as: TAPAZOLE TAKE 0.5 TABLETS (2.5 MG TOTAL) BY MOUTH 3 (THREE) TIMES A WEEK. What changed: when to take this Notes to patient: Resume home regimen   MULTI COMPLETE PO Take 1 tablet by mouth daily. Notes to patient: Resume home regimen   sertraline 25 MG tablet Commonly known as: Zoloft Take 1 tablet (25 mg total) by mouth daily. Notes to patient: Resume home regimen          Follow-up [facility], [place]. Go on 01/27/2022.   Specialty: General Surgery Why: Your appointment is 01/27/22 at 11:15 am.  Please arrive 30 minutes prior to your appointment to check in and fill out paperwork. Bring photo ID and insurance information. Contact information: [address] [place] [phone]  Signed: Obie Dredge, [facility] 01/09/2022, 10:45 AM

## 2022-01-09 NOTE — Plan of Care (Signed)
°  Problem: Education: Goal: Knowledge of General Education information will improve Description: Including pain rating scale, medication(s)/side effects and non-pharmacologic comfort measures Outcome: Progressing   Problem: Health Behavior/Discharge Planning: Goal: Ability to manage health-related needs will improve Outcome: Progressing   Problem: Clinical Measurements: Goal: Ability to maintain clinical measurements within normal limits will improve Outcome: Progressing   Problem: Activity: Goal: Risk for activity intolerance will decrease Outcome: Progressing   Problem: Nutrition: Goal: Adequate nutrition will be maintained Outcome: Progressing   Problem: Coping: Goal: Level of anxiety will decrease Outcome: Progressing   Problem: Elimination: Goal: Will not experience complications related to bowel motility Outcome: Progressing   Problem: Safety: Goal: Ability to remain free from injury will improve Outcome: Progressing   

## 2022-01-09 NOTE — Plan of Care (Signed)

## 2022-01-09 NOTE — Progress Notes (Signed)
Provided discharge education/instructions, all questions and concerns addressed, Pt not in acute distress. Pt tolerated breakfast well, no n/v. Discharged home with belongings accompanied by husband.

## 2022-01-09 NOTE — Progress Notes (Signed)
°  Transition of Care [facility]) Screening Note   Patient Details  Name: Gregg [name] Date of Birth: 05/22/1956   Transition of Care [facility]) CM/SW Contact:    Lennart Pall, LCSW Phone Number: 01/09/2022, 9:33 AM    Transition of Care Department [facility]) has reviewed patient and no TOC needs have been identified at this time. We will continue to monitor patient advancement through interdisciplinary progression rounds. If new patient transition needs arise, please place a TOC consult.

## 2022-01-10 NOTE — Anesthesia Postprocedure Evaluation (Signed)
Anesthesia Post Note  Patient: Lavance Beazer  Procedure(s) Performed: XI ROBOTIC ASSISTED LAPAROSCOPIC CHOLECYSTECTOMY (Abdomen)     Patient location during evaluation: PACU Anesthesia Type: General Level of consciousness: awake and alert Pain management: pain level controlled Vital Signs Assessment: post-procedure vital signs reviewed and stable Respiratory status: spontaneous breathing, nonlabored ventilation, respiratory function stable and patient connected to nasal cannula oxygen Cardiovascular status: blood pressure returned to baseline and stable Postop Assessment: no apparent nausea or vomiting Anesthetic complications: no   No notable events documented.  Last Vitals:  Vitals:   01/09/22 0436 01/09/22 0827  BP: 114/70 125/79  Pulse: 77 75  Resp: 17 16  Temp: 36.7 C 36.9 C  SpO2: 92% 94%    Last Pain:  Vitals:   01/09/22 1120  TempSrc:   PainSc: 0-No pain                 Barnet Glasgow

## 2022-01-12 ENCOUNTER — Telehealth: Payer: Self-pay

## 2022-01-12 LAB — SURGICAL PATHOLOGY

## 2022-01-12 NOTE — Telephone Encounter (Signed)
Transition Care Management Follow-up Telephone Call Date of discharge and from where: 01/09/2022, [facility]  How have you been since you were released from the hospital? He said surgery wise, he is sore; but he is not having any problems and his wounds seem to be healing well. His then explained that he has been having continuous diarrhea since he came home.  No nausea. He has been able to drink water but anything he eats causes diarrhea.  He said he just woke  up but will call the surgeon to report the diarrhea today.  Any questions or concerns? Yes - noted above  Items Reviewed: Did the pt receive and understand the discharge instructions provided? Yes  Medications obtained and verified? Yes  - he said he has all medications and he did not have any questions about his med regime.  Other? No  Any new allergies since your discharge? No  Dietary orders reviewed? Yes - he said he is trying to watch his fat intake.  Do you have support at home? Yes   Home Care and Equipment/Supplies: Were home health services ordered? no If so, what is the name of the agency? N/a  Has the agency set up a time to come to the patient's home? not applicable Were any new equipment or medical supplies ordered?  No What is the name of the medical supply agency? N/a Were you able to get the supplies/equipment? not applicable Do you have any questions related to the use of the equipment or supplies? No  Functional Questionnaire: (I = Independent and D = Dependent) ADLs: independent.   Follow up appointments reviewed:  PCP Hospital f/u appt confirmed?  No appointment scheduled at this time    [facility] f/u appt confirmed? Yes  Scheduled to see endocrinolgy - 01/13/2022 and [facility] - 01/27/2022.  Are transportation arrangements needed? No  If their condition worsens, is the pt aware to call PCP or go to the Emergency Dept.? Yes Was the patient provided with contact information for the  PCP's office or ED? Yes Was to pt encouraged to call back with questions or concerns? Yes

## 2022-01-13 ENCOUNTER — Other Ambulatory Visit: Payer: Self-pay

## 2022-01-13 ENCOUNTER — Ambulatory Visit: Payer: BC Managed Care – PPO | Admitting: Endocrinology

## 2022-01-13 DIAGNOSIS — E0590 Thyrotoxicosis, unspecified without thyrotoxic crisis or storm: Secondary | ICD-10-CM

## 2022-01-13 DIAGNOSIS — E049 Nontoxic goiter, unspecified: Secondary | ICD-10-CM | POA: Insufficient documentation

## 2022-01-13 LAB — TSH: TSH: 0.55 u[IU]/mL (ref 0.35–5.50)

## 2022-01-13 LAB — T4, FREE: Free T4: 0.91 ng/dL (ref 0.60–1.60)

## 2022-01-13 NOTE — Patient Instructions (Addendum)
Blood tests are requested for you today.  We'll let you know about the results.   If ever you have fever while taking methimazole, stop it and call us, even if the reason is obvious, because of the risk of a rare side-effect.   It is best to never miss the medication.  However, if you do miss it, next best is to double up the next time.   Please come back for a follow-up appointment in 3 months.

## 2022-01-13 NOTE — Progress Notes (Signed)
Subjective:    Patient ID: Gregg [name], male    DOB: 1956/06/15, 66 y.o.   MRN: [id number]  HPI Pt returns for f/u of hyperthyroidism (dx'ed 2022; US showed MNG; bx showed MNG--bx was advised).  He is recovering from [place] surgery.  pt states he feels well in general.   Past Medical History:  Diagnosis Date   Anxiety    Bilateral inguinal hernia 06/02/2021   GERD (gastroesophageal reflux disease)    History of kidney stones 2020   Hypertension    Sinus complaint 06/02/2021   x 1 week per pt   Wears glasses 06/02/2021    Past Surgical History:  Procedure Laterality Date   COLONOSCOPY WITH PROPOFOL N/A 12/31/2021   Procedure: COLONOSCOPY WITH PROPOFOL;  Surgeon: Lavena Bullion, DO;  Location: [facility];  Service: Gastroenterology;  Laterality: N/A;   ENDOSCOPIC MUCOSAL RESECTION N/A 12/31/2021   Procedure: ENDOSCOPIC MUCOSAL RESECTION;  Surgeon: Lavena Bullion, DO;  Location: [facility];  Service: Gastroenterology;  Laterality: N/A;   EXTRACORPOREAL SHOCK WAVE LITHOTRIPSY Left 02/23/2019   Procedure: EXTRACORPOREAL SHOCK WAVE LITHOTRIPSY (ESWL);  Surgeon: Bjorn Loser, MD;  Location: [facility];  Service: Urology;  Laterality: Left;   HEMOSTASIS CLIP PLACEMENT  12/31/2021   Procedure: HEMOSTASIS CLIP PLACEMENT;  Surgeon: Lavena Bullion, DO;  Location: [facility];  Service: Gastroenterology;;   INGUINAL HERNIA REPAIR Bilateral 06/06/2021   Procedure: LAPAROSCOPIC BILATERAL INGUINAL HERNIA REPAIRS WITH MESH;  Surgeon: Michael Boston, MD;  Location: [facility];  Service: General;  Laterality: Bilateral;  TAP BLOCK   LAPAROSCOPIC APPENDECTOMY N/A 03/07/2021   Procedure: APPENDECTOMY LAPAROSCOPIC;  Surgeon: Michael Boston, MD;  Location: [facility];  Service: General;  Laterality: N/A;   SUBMUCOSAL LIFTING INJECTION  12/31/2021   Procedure: SUBMUCOSAL LIFTING INJECTION;  Surgeon: Lavena Bullion, DO;  Location: [facility];  Service: Gastroenterology;;     Social History   Socioeconomic History   Marital status: Single    Spouse name: Not on file   Number of children: Not on file   Years of education: Not on file   Highest education level: Not on file  Occupational History   Not on file  Tobacco Use   Smoking status: Former    Packs/day: 0.50    Years: 40.00    Pack years: 20.00    Types: Cigarettes    Quit date: 03/29/2017    Years since quitting: 4.8   Smokeless tobacco: Never  Vaping Use   Vaping Use: Never used  Substance and Sexual Activity   Alcohol use: Yes    Comment: rare   Drug use: Never   Sexual activity: Not on file  Other Topics Concern   Not on file  Social History Narrative   Not on file   Social Determinants of Health   Financial Resource Strain: Not on file  Food Insecurity: Not on file  Transportation Needs: Not on file  Physical Activity: Not on file  Stress: Not on file  Social Connections: Not on file  Intimate Partner Violence: Not on file    Current Outpatient Medications on File Prior to Visit  Medication Sig Dispense Refill   acetaminophen (TYLENOL) 500 MG tablet Take 1 tablet (500 mg total) by mouth every 6 (six) hours as needed for mild pain or moderate pain. 100 tablet 2   atorvastatin (LIPITOR) 40 MG tablet Take 1 tablet (40 mg total) by mouth daily. 120 tablet 0   HYDROcodone-acetaminophen (NORCO/VICODIN) 5-325 MG tablet  Take 1 tablet by mouth every 6 (six) hours as needed for moderate pain (pain not releived by tylenol). 30 tablet 0   levocetirizine (XYZAL) 5 MG tablet Take 5 mg by mouth every evening.     losartan-hydrochlorothiazide (HYZAAR) 50-12.5 MG tablet Take 1 tablet by mouth daily. 90 tablet 0   methimazole (TAPAZOLE) 5 MG tablet TAKE 0.5 TABLETS (2.5 MG TOTAL) BY MOUTH 3 (THREE) TIMES A WEEK. (Patient taking differently: Take 2.5 mg by mouth every other day.) 20 tablet 1   Multiple Vitamins-Minerals (MULTI COMPLETE PO) Take 1 tablet by mouth daily.     sertraline (ZOLOFT)  25 MG tablet Take 1 tablet (25 mg total) by mouth daily. 90 tablet 0   No current facility-administered medications on file prior to visit.    No Known Allergies  Family History  Problem Relation Age of Onset   Thyroid disease Mother    Thyroid disease Father    Thyroid disease Sister    Stomach cancer Neg Hx    Colon cancer Neg Hx    Esophageal cancer Neg Hx    Rectal cancer Neg Hx     BP (!) 152/82    Pulse 89    Ht 5\' 9"  (1.753 m)    Wt 169 lb 9.6 oz (76.9 kg)    SpO2 96%    BMI 25.05 kg/m    Review of Systems Denies fever    Objective:   Physical Exam VITAL SIGNS:  See vs page GENERAL: no distress NECK: thyroid is 2-3 times normal size, with bilat palpable nodules.     Lab Results  Component Value Date   TSH 0.55 01/13/2022      Assessment & Plan:  Hyperthyroidism: well-controlled.  Please continue the same synthroid. MNG: we discussed bx vs [place] f/u.  He declines bx, at least for now.

## 2022-02-08 ENCOUNTER — Telehealth: Payer: BC Managed Care – PPO | Admitting: Family

## 2022-02-08 DIAGNOSIS — J069 Acute upper respiratory infection, unspecified: Secondary | ICD-10-CM | POA: Diagnosis not present

## 2022-02-08 MED ORDER — FLUTICASONE PROPIONATE 50 MCG/ACT NA SUSP: 2.0000 | Freq: Every day | NASAL | 6 refills | Status: DC

## 2022-02-08 MED ORDER — BENZONATATE 100 MG PO CAPS: 100.0000 mg | ORAL_CAPSULE | Freq: Three times a day (TID) | ORAL | 0 refills | Status: DC | PRN

## 2022-02-08 NOTE — Progress Notes (Signed)

## 2022-02-17 ENCOUNTER — Telehealth: Payer: BC Managed Care – PPO | Admitting: Physician Assistant

## 2022-02-17 DIAGNOSIS — J019 Acute sinusitis, unspecified: Secondary | ICD-10-CM | POA: Diagnosis not present

## 2022-02-17 DIAGNOSIS — B9689 Other specified bacterial agents as the cause of diseases classified elsewhere: Secondary | ICD-10-CM | POA: Diagnosis not present

## 2022-02-18 MED ORDER — AMOXICILLIN-POT CLAVULANATE 875-125 MG PO TABS: 1.0000 | ORAL_TABLET | Freq: Two times a day (BID) | ORAL | 0 refills | Status: DC

## 2022-02-18 MED ORDER — IPRATROPIUM BROMIDE 0.03 % NA SOLN: 2.0000 | Freq: Two times a day (BID) | NASAL | 0 refills | Status: DC

## 2022-02-18 NOTE — Progress Notes (Signed)
E-Visit for Sinus Problems ? ?We are sorry that you are not feeling well.  Here is how we plan to help! ? ?Based on what you have shared with me it looks like you have sinusitis.  Sinusitis is inflammation and infection in the sinus cavities of the head.  Based on your presentation I believe you most likely have Acute Bacterial Sinusitis.  This is an infection caused by bacteria and is treated with antibiotics. I have prescribed Augmentin 875mg /125mg  one tablet twice daily with food, for 7 days. You may use an oral decongestant such as Mucinex D or if you have glaucoma or high blood pressure use plain Mucinex. Saline nasal spray help and can safely be used as often as needed for congestion.  If you develop worsening sinus pain, fever or notice severe headache and vision changes, or if symptoms are not better after completion of antibiotic, please schedule an appointment with a health care provider.   ? ?I will also prescribe Ipratropium Bromide nasal spray for the nasal congestion. Use twice daily for 5-7 days.  ? ?Sinus infections are not as easily transmitted as other respiratory infection, however we still recommend that you avoid close contact with loved ones, especially the very young and elderly.  Remember to wash your hands thoroughly throughout the day as this is the number one way to prevent the spread of infection! ? ?Home Care: ?Only take medications as instructed by your medical team. ?Complete the entire course of an antibiotic. ?Do not take these medications with alcohol. ?A steam or ultrasonic humidifier can help congestion.  You can place a towel over your head and breathe in the steam from hot water coming from a faucet. ?Avoid close contacts especially the very young and the elderly. ?Cover your mouth when you cough or sneeze. ?Always remember to wash your hands. ? ?Get Help Right Away If: ?You develop worsening fever or sinus pain. ?You develop a severe head ache or visual changes. ?Your symptoms  persist after you have completed your treatment plan. ? ?Make sure you ?Understand these instructions. ?Will watch your condition. ?Will get help right away if you are not doing well or get worse. ? ?Thank you for choosing an e-visit. ? ?Your e-visit answers were reviewed by a board certified advanced clinical practitioner to complete your personal care plan. Depending upon the condition, your plan could have included both over the counter or prescription medications. ? ?Please review your pharmacy choice. Make sure the pharmacy is open so you can pick up prescription now. If there is a problem, you may contact your provider through [employer] and have the prescription routed to another pharmacy.  Your safety is important to [place]. If you have drug allergies check your prescription carefully.  ? ?For the next 24 hours you can use MyChart to ask questions about today's visit, request a non-urgent call back, or ask for a work or school excuse. ?You will get an email in the next two days asking about your experience. I hope that your e-visit has been valuable and will speed your recovery. ? ?I provided 5 minutes of non face-to-face time during this encounter for chart review and documentation.  ? ?

## 2022-02-20 ENCOUNTER — Telehealth: Payer: BC Managed Care – PPO | Admitting: Physician Assistant

## 2022-02-20 DIAGNOSIS — R051 Acute cough: Secondary | ICD-10-CM

## 2022-02-20 MED ORDER — PREDNISONE 10 MG (21) PO TBPK: ORAL_TABLET | ORAL | 0 refills | Status: DC

## 2022-02-20 NOTE — Progress Notes (Signed)
We are sorry that you are not feeling well.  Here is how we plan to help! ? ?Based on your presentation I believe you most likely have A cough due to bacteria.  When patients have a fever and a productive cough with a change in color or increased sputum production, we are concerned about bacterial bronchitis.  If left untreated it can progress to pneumonia.  If your symptoms do not improve with your treatment plan it is important that you contact your provider.   Continue Augmentin as prescribed.   ?  ?In addition you may use A non-prescription cough medication called Robitussin DAC. Take 2 teaspoons every 8 hours or Delsym: take 2 teaspoons every 12 hours. ? ?Prednisone 10 mg daily for 6 days (see taper instructions below) ? ?Directions for 6 day taper: ?Day 1: 2 tablets before breakfast, 1 after both lunch & dinner and 2 at bedtime ?Day 2: 1 tab before breakfast, 1 after both lunch & dinner and 2 at bedtime ?Day 3: 1 tab at each meal & 1 at bedtime ?Day 4: 1 tab at breakfast, 1 at lunch, 1 at bedtime ?Day 5: 1 tab at breakfast & 1 tab at bedtime ?Day 6: 1 tab at breakfast ? ?From your responses in the eVisit questionnaire you describe inflammation in the upper respiratory tract which is causing a significant cough.  This is commonly called Bronchitis and has four common causes:   ?Allergies ?Viral Infections ?Acid Reflux ?Bacterial Infection ?Allergies, viruses and acid reflux are treated by controlling symptoms or eliminating the cause. An example might be a cough caused by taking certain blood pressure medications. You stop the cough by changing the medication. Another example might be a cough caused by acid reflux. Controlling the reflux helps control the cough. ? ?USE OF BRONCHODILATOR ("RESCUE") INHALERS: ?There is a risk from using your bronchodilator too frequently.  The risk is that over-reliance on a medication which only relaxes the muscles surrounding the breathing tubes can reduce the effectiveness of  medications prescribed to reduce swelling and congestion of the tubes themselves.  Although you feel brief relief from the bronchodilator inhaler, your asthma may actually be worsening with the tubes becoming more swollen and filled with mucus.  This can delay other crucial treatments, such as oral steroid medications. If you need to use a bronchodilator inhaler daily, several times per day, you should discuss this with your provider.  There are probably better treatments that could be used to keep your asthma under control.  ?   ?HOME CARE ?Only take medications as instructed by your medical team. ?Complete the entire course of an antibiotic. ?Drink plenty of fluids and get plenty of rest. ?Avoid close contacts especially the very young and the elderly ?Cover your mouth if you cough or cough into your sleeve. ?Always remember to wash your hands ?A steam or ultrasonic humidifier can help congestion.  ? ?GET HELP RIGHT AWAY IF: ?You develop worsening fever. ?You become short of breath ?You cough up blood. ?Your symptoms persist after you have completed your treatment plan ?MAKE SURE YOU  ?Understand these instructions. ?Will watch your condition. ?Will get help right away if you are not doing well or get worse. ?  ? ?Thank you for choosing an e-visit. ? ?Your e-visit answers were reviewed by a board certified advanced clinical practitioner to complete your personal care plan. Depending upon the condition, your plan could have included both over the counter or prescription medications. ? ?Please review your  pharmacy choice. Make sure the pharmacy is open so you can pick up prescription now. If there is a problem, you may contact your provider through [employer] and have the prescription routed to another pharmacy.  Your safety is important to [place]. If you have drug allergies check your prescription carefully.  ? ?For the next 24 hours you can use MyChart to ask questions about today's visit, request a non-urgent  call back, or ask for a work or school excuse. ?You will get an email in the next two days asking about your experience. I hope that your e-visit has been valuable and will speed your recovery. ? ?I provided 5 minutes of non face-to-face time during this encounter for chart review and documentation.  ? ?

## 2022-03-03 ENCOUNTER — Telehealth: Payer: BC Managed Care – PPO | Admitting: Physician Assistant

## 2022-03-03 DIAGNOSIS — K219 Gastro-esophageal reflux disease without esophagitis: Secondary | ICD-10-CM | POA: Diagnosis not present

## 2022-03-03 MED ORDER — PANTOPRAZOLE SODIUM 40 MG PO TBEC: 40.0000 mg | DELAYED_RELEASE_TABLET | Freq: Every day | ORAL | 0 refills | Status: DC

## 2022-03-03 NOTE — Progress Notes (Signed)
I have spent 5 minutes in review of e-visit questionnaire, review and updating patient chart, medical decision making and response to patient.   Madaline Lefeber Cody Anahlia Iseminger, PA-C    

## 2022-03-03 NOTE — Progress Notes (Signed)
E-Visit for Heartburn ? ?We are sorry that you are not feeling well.  Here is how we plan to help! ? ?Based on what you shared with me it looks like you most likely have Gastroesophageal Reflux Disease (GERD) ? ?Gastroesophageal reflux disease (GERD) happens when acid from your stomach flows up into the esophagus.  When acid comes in contact with the esophagus, the acid causes sorenss (inflammation) in the esophagus.  Over time, GERD may create small holes (ulcers) in the lining of the esophagus. ? ?I have prescribed Pantoprazole 40 mg to take once daily for up to two weeks. You need to schedule a follow-up with your regular provider for ongoing management/monitoring.  ? ?Your symptoms should improve in the next day or two.  You can use antacids as needed until symptoms resolve.  Call us if your heartburn worsens, you have trouble swallowing, weight loss, spitting up blood or recurrent vomiting. ? ?Home Care: ?May include lifestyle changes such as weight loss, quitting smoking and alcohol consumption ?Avoid foods and drinks that make your symptoms worse, such as: ?Caffeine or alcoholic drinks ?Chocolate ?Peppermint or mint flavorings ?Garlic and onions ?Spicy foods ?Citrus fruits, such as oranges, lemons, or limes ?Tomato-based foods such as sauce, chili, salsa and pizza ?Fried and fatty foods ?Avoid lying down for 3 hours prior to your bedtime or prior to taking a nap ?Eat small, frequent meals instead of a large meals ?Wear loose-fitting clothing.  Do not wear anything tight around your waist that causes pressure on your stomach. ?Raise the head of your bed 6 to 8 inches with wood blocks to help you sleep.  Extra pillows will not help. ? ?Seek Help Right Away If: ?You have pain in your arms, neck, jaw, teeth or back ?Your pain increases or changes in intensity or duration ?You develop nausea, vomiting or sweating (diaphoresis) ?You develop shortness of breath or you faint ?Your vomit is green, yellow, black or  looks like coffee grounds or blood ?Your stool is red, bloody or black ? ?These symptoms could be signs of other problems, such as heart disease, gastric bleeding or esophageal bleeding. ? ?Make sure you : ?Understand these instructions. ?Will watch your condition. ?Will get help right away if you are not doing well or get worse. ? ?Your e-visit answers were reviewed by a board certified advanced clinical practitioner to complete your personal care plan.  Depending on the condition, your plan could have included both over the counter or prescription medications. ? ?If there is a problem please reply  once you have received a response from your provider. ? ?Your safety is important to [place].  If you have drug allergies check your prescription carefully.   ? ?You can use MyChart to ask questions about today?s visit, request a non-urgent call back, or ask for a work or school excuse for 24 hours related to this e-Visit. If it has been greater than 24 hours you will need to follow up with your provider, or enter a new e-Visit to address those concerns. ? ?You will get an e-mail in the next two days asking about your experience.  I hope that your e-visit has been valuable and will speed your recovery. Thank you for using e-visits. ? ? ?

## 2022-03-11 NOTE — Progress Notes (Signed)
? ? ?Patient ID: Gregg [name], male    DOB: Apr 06, 1956  MRN: [id number] ? ?CC: Hypertension Follow-Up ? ?Subjective: ?Gregg [name] is a 66 y.o. male who presents for hypertension follow-up.  ? ?His concerns today include:  ?HYPERTENSION FOLLOW-UP: ?11/25/2021: ?- Continue Losartan-Hydrochlorothiazide 50-12.5 mg daily as prescribed.  ? ?03/17/2022: ?Doing well on current regimen. No side effects. No issues/concerns. Denies chest pain. Has noticed higher than normal heart rate intermittently which he monitors on his smart watch. Also, having intermittent shortness of breath without activity.  ? ?2. ANXIETY DEPRESSION FOLLOW-UP: ?11/25/2021: ?- Continue Sertraline 25 mg daily as prescribed. ? ?03/17/2022: ?Doing well on current regimen, no issues/concerns.  ? ?3. HYPERLIPIDEMIA FOLLOW-UP: ?11/25/2021: ?- Continue Atorvastatin as prescribed.  ? ?03/17/2022: ?Doing well on current regimen, no issues/concerns.  ? ?4. EKG CONCERN: ?Reports EKG at during hospital admission January 2023 concerning.  ? ? ?  03/17/2022  ?  8:39 AM 11/25/2021  ?  2:02 PM 09/09/2021  ? 10:16 AM 08/08/2021  ? 11:02 AM 07/22/2021  ?  1:51 PM  ?Depression screen PHQ 2/9  ?Decreased Interest 0 0 0 0 0  ?Down, Depressed, Hopeless 0 0 0 0 0  ?PHQ - 2 Score 0 0 0 0 0  ?Altered sleeping    1 1  ?Tired, decreased energy    0 1  ?Change in appetite    0 1  ?Feeling bad or failure about yourself     0 0  ?Trouble concentrating    0 0  ?Moving slowly or fidgety/restless    0 0  ?Suicidal thoughts    0 0  ?PHQ-9 Score    1 3  ?Difficult doing work/chores   Not difficult at all Not difficult at all Not difficult at all  ? ? ? ?Patient Active Problem List  ? Diagnosis Date Noted  ? Hyperthyroidism 01/13/2022  ? Symptomatic cholelithiasis 01/08/2022  ? S/P laparoscopic cholecystectomy 01/08/2022  ? Adenomatous polyp of transverse colon   ? Diverticulosis of colon without hemorrhage   ? Grade II internal hemorrhoids   ? Multinodular goiter 12/01/2021  ? Hyperlipidemia  09/11/2021  ? Essential hypertension 07/22/2021  ? Anxiety and depression 07/02/2021  ? History of appendicitis 07/01/2021  ? Acute phlegmonous appendicitis s/p lap appendectomy 03/07/2021 03/07/2021  ? Kidney stone 03/07/2021  ? Bilateral inguinal hernia (BIH) s/p lap repair with mesh 06/06/2021 03/07/2021  ?  ? ?Current Outpatient Medications on File Prior to Visit  ?Medication Sig Dispense Refill  ? levocetirizine (XYZAL) 5 MG tablet Take 5 mg by mouth every evening.    ? methimazole (TAPAZOLE) 5 MG tablet TAKE 0.5 TABLETS (2.5 MG TOTAL) BY MOUTH 3 (THREE) TIMES A WEEK. (Patient taking differently: Take 2.5 mg by mouth every other day.) 20 tablet 1  ? Multiple Vitamins-Minerals (MULTI COMPLETE PO) Take 1 tablet by mouth daily.    ? MYRBETRIQ 50 MG TB24 tablet Take 50 mg by mouth daily.    ? pantoprazole (PROTONIX) 40 MG tablet Take 1 tablet (40 mg total) by mouth daily. (Patient not taking: Reported on 03/17/2022) 30 tablet 0  ? ?No current facility-administered medications on file prior to visit.  ? ? ?No Known Allergies ? ?Social History  ? ?Socioeconomic History  ? Marital status: Single  ?  Spouse name: Not on file  ? Number of children: Not on file  ? Years of education: Not on file  ? Highest education level: Not on file  ?Occupational History  ?  Not on file  ?Tobacco Use  ? Smoking status: Former  ?  Packs/day: 0.50  ?  Years: 40.00  ?  Pack years: 20.00  ?  Types: Cigarettes  ?  Quit date: 03/29/2017  ?  Years since quitting: 4.9  ? Smokeless tobacco: Never  ?Vaping Use  ? Vaping Use: Never used  ?Substance and Sexual Activity  ? Alcohol use: Yes  ?  Comment: rare  ? Drug use: Never  ? Sexual activity: Not on file  ?Other Topics Concern  ? Not on file  ?Social History Narrative  ? Not on file  ? ?Social Determinants of Health  ? ?Financial Resource Strain: Not on file  ?Food Insecurity: Not on file  ?Transportation Needs: Not on file  ?Physical Activity: Not on file  ?Stress: Not on file  ?Social Connections:  Not on file  ?Intimate Partner Violence: Not on file  ? ? ?Family History  ?Problem Relation Age of Onset  ? Thyroid disease Mother   ? Thyroid disease Father   ? Thyroid disease Sister   ? Stomach cancer Neg Hx   ? Colon cancer Neg Hx   ? Esophageal cancer Neg Hx   ? Rectal cancer Neg Hx   ? ? ?Past Surgical History:  ?Procedure Laterality Date  ? COLONOSCOPY WITH PROPOFOL N/A 12/31/2021  ? Procedure: COLONOSCOPY WITH PROPOFOL;  Surgeon: Lavena Bullion, DO;  Location: [facility];  Service: Gastroenterology;  Laterality: N/A;  ? ENDOSCOPIC MUCOSAL RESECTION N/A 12/31/2021  ? Procedure: ENDOSCOPIC MUCOSAL RESECTION;  Surgeon: Lavena Bullion, DO;  Location: [facility];  Service: Gastroenterology;  Laterality: N/A;  ? EXTRACORPOREAL SHOCK WAVE LITHOTRIPSY Left 02/23/2019  ? Procedure: EXTRACORPOREAL SHOCK WAVE LITHOTRIPSY (ESWL);  Surgeon: Bjorn Loser, MD;  Location: [facility];  Service: Urology;  Laterality: Left;  ? HEMOSTASIS CLIP PLACEMENT  12/31/2021  ? Procedure: HEMOSTASIS CLIP PLACEMENT;  Surgeon: Lavena Bullion, DO;  Location: [facility];  Service: Gastroenterology;;  ? INGUINAL HERNIA REPAIR Bilateral 06/06/2021  ? Procedure: LAPAROSCOPIC BILATERAL INGUINAL HERNIA REPAIRS WITH MESH;  Surgeon: Michael Boston, MD;  Location: [facility];  Service: General;  Laterality: Bilateral;  TAP BLOCK  ? LAPAROSCOPIC APPENDECTOMY N/A 03/07/2021  ? Procedure: APPENDECTOMY LAPAROSCOPIC;  Surgeon: Michael Boston, MD;  Location: [facility];  Service: General;  Laterality: N/A;  ? SUBMUCOSAL LIFTING INJECTION  12/31/2021  ? Procedure: SUBMUCOSAL LIFTING INJECTION;  Surgeon: Lavena Bullion, DO;  Location: [facility];  Service: Gastroenterology;;  ? ? ?ROS: ?Review of Systems ?Negative except as stated above ? ?PHYSICAL EXAM: ?BP 128/72 (BP Location: Left Arm, Patient Position: Sitting, Cuff Size: Normal)   Pulse 86   Temp 98.3 ?F (36.8 ?C)   Resp 18   Ht 5' 9.02" (1.753 m)   Wt 173 lb (78.5  kg)   SpO2 98%   BMI 25.54 kg/m?  ? ?Physical Exam ?HENT:  ?   Head: Normocephalic and atraumatic.  ?Eyes:  ?   Extraocular Movements: Extraocular movements intact.  ?   Conjunctiva/sclera: Conjunctivae normal.  ?   Pupils: Pupils are equal, round, and reactive to light.  ?Cardiovascular:  ?   Rate and Rhythm: Normal rate and regular rhythm.  ?   Pulses: Normal pulses.  ?   Heart sounds: Normal heart sounds.  ?Pulmonary:  ?   Effort: Pulmonary effort is normal.  ?   Breath sounds: Normal breath sounds.  ?Musculoskeletal:  ?   Cervical back: Normal range of motion and neck  supple.  ?Neurological:  ?   General: No focal deficit present.  ?   Mental Status: He is alert and oriented to person, place, and time.  ?Psychiatric:     ?   Mood and Affect: Mood normal.     ?   Behavior: Behavior normal.  ? ?ASSESSMENT AND PLAN: ?1. Essential (primary) hypertension: ?- Continue Losartan-Hydrochlorothiazide as prescribed.  ?- Counseled on blood pressure goal of less than 130/80, low-sodium, DASH diet, medication compliance, and 150 minutes of moderate intensity exercise per week as tolerated. Counseled on medication adherence and adverse effects. ?- Follow-up with primary provider in 4 months or sooner if needed. ?- losartan-hydrochlorothiazide (HYZAAR) 50-12.5 MG tablet; Take 1 tablet by mouth daily.  Dispense: 120 tablet; Refill: 0 ? ?2. Anxiety and depression: ?- Patient denies thoughts of self-harm, suicidal ideations, homicidal ideations. ?- Continue Sertraline as prescribed.  ?- Follow-up with primary provider in 4 months or sooner if needed.  ?- sertraline (ZOLOFT) 25 MG tablet; Take 1 tablet (25 mg total) by mouth daily.  Dispense: 120 tablet; Refill: 0 ? ?3. Hyperlipidemia, unspecified hyperlipidemia type: ?- Continue Atorvastatin as prescribed.  ?- Follow-up with primary provider as scheduled. ?- atorvastatin (LIPITOR) 40 MG tablet; Take 1 tablet (40 mg total) by mouth daily.  Dispense: 120 tablet; Refill: 0 ? ?4.  Hyperthyroidism: ?- Discussed increased heart rate and intermittent shortness of breath likely secondary to hyperthyroidism.  ?- Keep all scheduled appointments with Renato Shin, MD at [facility].  ? ?5. R

## 2022-03-17 ENCOUNTER — Encounter: Payer: Self-pay | Admitting: Family

## 2022-03-17 ENCOUNTER — Ambulatory Visit (INDEPENDENT_AMBULATORY_CARE_PROVIDER_SITE_OTHER): Payer: BC Managed Care – PPO | Admitting: Family

## 2022-03-17 ENCOUNTER — Other Ambulatory Visit: Payer: Self-pay

## 2022-03-17 VITALS — BP 128/72 | HR 86 | Temp 98.3°F | Resp 18 | Ht 69.02 in | Wt 173.0 lb

## 2022-03-17 DIAGNOSIS — F419 Anxiety disorder, unspecified: Secondary | ICD-10-CM

## 2022-03-17 DIAGNOSIS — I4510 Unspecified right bundle-branch block: Secondary | ICD-10-CM

## 2022-03-17 DIAGNOSIS — E0590 Thyrotoxicosis, unspecified without thyrotoxic crisis or storm: Secondary | ICD-10-CM

## 2022-03-17 DIAGNOSIS — E785 Hyperlipidemia, unspecified: Secondary | ICD-10-CM | POA: Diagnosis not present

## 2022-03-17 DIAGNOSIS — I10 Essential (primary) hypertension: Secondary | ICD-10-CM | POA: Diagnosis not present

## 2022-03-17 DIAGNOSIS — I517 Cardiomegaly: Secondary | ICD-10-CM

## 2022-03-17 DIAGNOSIS — F32A Depression, unspecified: Secondary | ICD-10-CM

## 2022-03-17 MED ORDER — ATORVASTATIN CALCIUM 40 MG PO TABS: 40.0000 mg | ORAL_TABLET | Freq: Every day | ORAL | 0 refills | Status: DC

## 2022-03-17 MED ORDER — SERTRALINE HCL 25 MG PO TABS: 25.0000 mg | ORAL_TABLET | Freq: Every day | ORAL | 0 refills | Status: DC

## 2022-03-17 MED ORDER — LOSARTAN POTASSIUM-HCTZ 50-12.5 MG PO TABS: 1.0000 | ORAL_TABLET | Freq: Every day | ORAL | 0 refills | Status: DC

## 2022-03-17 NOTE — Patient Instructions (Signed)
Hyperthyroidism ?Hyperthyroidism is when the thyroid gland is too active (overactive). The thyroid gland is a small gland located in the lower front part of the neck, just in front of the windpipe (trachea). This gland makes hormones that help control how the body uses food for energy (metabolism) as well as how the heart and brain function. These hormones also play a role in keeping your bones strong. When the thyroid is overactive, it produces too much of a hormone called thyroxine. ?What are the causes? ?This condition may be caused by: ?Graves' disease. This is a disorder in which the body's disease-fighting system (immune system) attacks the thyroid gland. This is the most common cause. ?Inflammation of the thyroid gland. ?A tumor in the thyroid gland. ?Use of certain medicines, including: ?Prescription thyroid hormone replacement. ?Herbal supplements that mimic thyroid hormones. ?Amiodarone therapy. ?Solid or fluid-filled lumps within your thyroid gland (thyroid nodules). ?Taking in a large amount of iodine from foods or medicines. ?What increases the risk? ?You are more likely to develop this condition if: ?You are male. ?You have a family history of thyroid conditions. ?You smoke tobacco. ?You use a medicine called lithium. ?You take medicines that affect the immune system (immunosuppressants). ?What are the signs or symptoms? ?Symptoms of this condition include: ?Nervousness. ?Inability to tolerate heat. ?Unexplained weight loss. ?Diarrhea. ?Change in the texture of hair or skin. ?Heart skipping beats or making extra beats. ?Rapid heart rate. ?Loss of menstruation. ?Shaky hands. ?Fatigue. ?Restlessness. ?Sleep problems. ?Enlarged thyroid gland or a lump in the thyroid (nodule). ?You may also have symptoms of Graves' disease, which may include: ?Protruding eyes. ?Dry eyes. ?Red or swollen eyes. ?Problems with vision. ?How is this diagnosed? ?This condition may be diagnosed based on: ?Your symptoms and  medical history. ?A physical exam. ?Blood tests. ?Thyroid ultrasound. This test involves using sound waves to produce images of the thyroid gland. ?A thyroid scan. A radioactive substance is injected into a vein, and images show how much iodine is present in the thyroid. ?Radioactive iodine uptake test (RAIU). A small amount of radioactive iodine is given by mouth to see how much iodine the thyroid absorbs after a certain amount of time. ?How is this treated? ?Treatment depends on the cause and severity of the condition. Treatment may include: ?Medicines to reduce the amount of thyroid hormone your body makes. ?Radioactive iodine treatment (radioiodine therapy). This involves swallowing a small dose of radioactive iodine, in capsule or liquid form, to kill thyroid cells. ?Surgery to remove part or all of your thyroid gland. You may need to take thyroid hormone replacement medicine for the rest of your life after thyroid surgery. ?Medicines to help manage your symptoms. ?Follow these instructions at home: ? ?Take over-the-counter and prescription medicines only as told by your health care provider. ?Do not use any products that contain nicotine or tobacco, such as cigarettes and e-cigarettes. If you need help quitting, ask your health care provider. ?Follow any instructions from your health care provider about diet. You may be instructed to limit foods that contain iodine. ?Keep all follow-up visits as told by your health care provider. This is important. ?You will need to have blood tests regularly so that your health care provider can monitor your condition. ?Contact a health care provider if: ?Your symptoms do not get better with treatment. ?You have a fever. ?You are taking thyroid hormone replacement medicine and you: ?Have symptoms of depression. ?Feel like you are tired all the time. ?Gain weight. ?Get help right  away if: ?You have chest pain. ?You have decreased alertness or a change in your awareness. ?You  have abdominal pain. ?You feel dizzy. ?You have a rapid heartbeat. ?You have an irregular heartbeat. ?You have difficulty breathing. ?Summary ?The thyroid gland is a small gland located in the lower front part of the neck, just in front of the windpipe (trachea). ?Hyperthyroidism is when the thyroid gland is too active (overactive) and produces too much of a hormone called thyroxine. ?The most common cause is Graves' disease, a disorder in which your immune system attacks the thyroid gland. ?Hyperthyroidism can cause various symptoms, such as unexplained weight loss, nervousness, inability to tolerate heat, or changes in your heartbeat. ?Treatment may include medicine to reduce the amount of thyroid hormone your body makes, radioiodine therapy, surgery, or medicines to manage symptoms. ?This information is not intended to replace advice given to you by your health care provider. Make sure you discuss any questions you have with your health care provider. ?Document Revised: 08/22/2020 Document Reviewed: 08/22/2020 ?Elsevier Patient Education ? 2022 [place]. ? ? ?

## 2022-03-17 NOTE — Progress Notes (Signed)
Pt presents for hypertension follow-up, stopped taking pantoprazole because read on [employer] that should only take for two weeks ?

## 2022-03-23 ENCOUNTER — Ambulatory Visit (INDEPENDENT_AMBULATORY_CARE_PROVIDER_SITE_OTHER): Payer: BC Managed Care – PPO

## 2022-03-23 ENCOUNTER — Encounter: Payer: Self-pay | Admitting: Internal Medicine

## 2022-03-23 ENCOUNTER — Ambulatory Visit: Payer: BC Managed Care – PPO | Admitting: Internal Medicine

## 2022-03-23 VITALS — BP 160/78 | HR 93 | Ht 69.0 in | Wt 174.6 lb

## 2022-03-23 DIAGNOSIS — R002 Palpitations: Secondary | ICD-10-CM | POA: Diagnosis not present

## 2022-03-23 IMAGING — US US THYROID
1 series · 15 of 25 positions shown · non-contrast
Comparison: None.

CLINICAL DATA: Low TSH

EXAM:
THYROID ULTRASOUND
TECHNIQUE: Ultrasound examination of the thyroid gland and adjacent soft
tissues was performed.

[Series 1: us thyroid · 0.08mm/px · 15 of 59 slices shown]
[im 1/59]
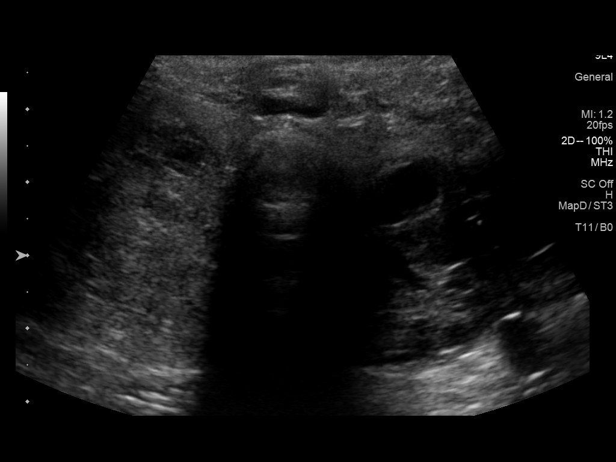
[im 5/59]
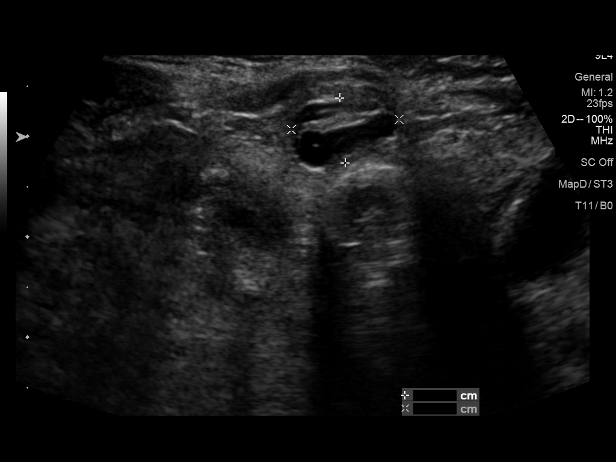
[im 10/59]
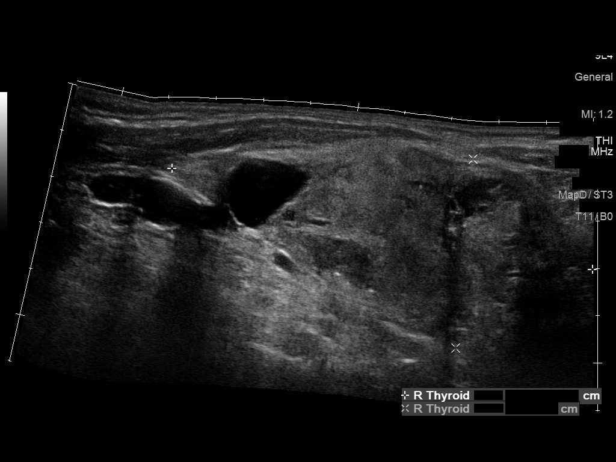
[im 13/59]
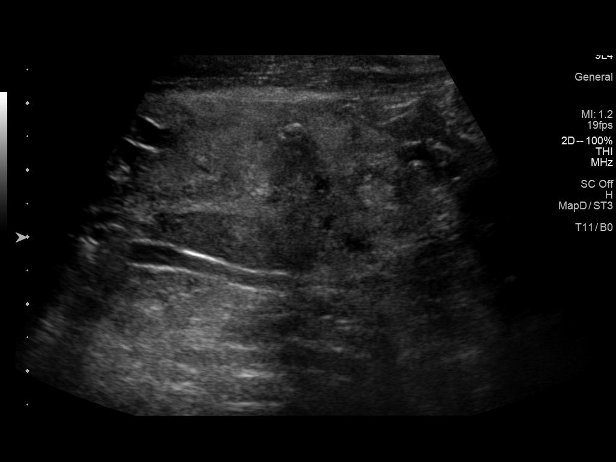
[im 17/59]
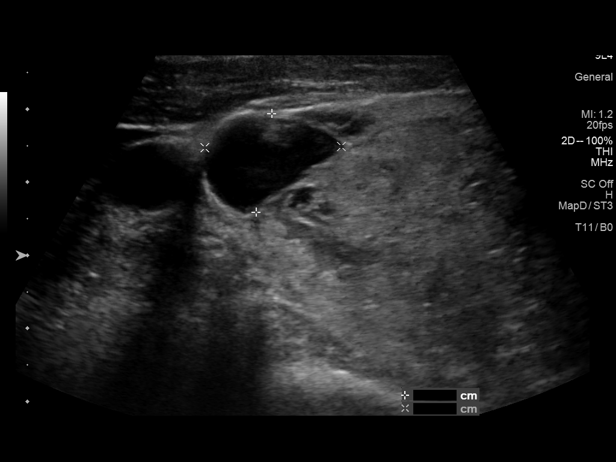
[im 22/59]
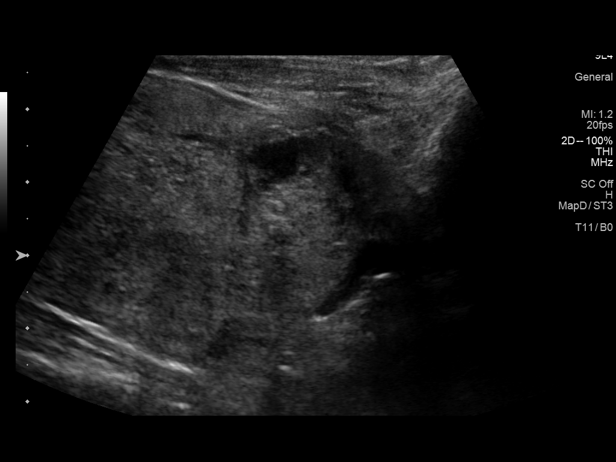
[im 25/59]
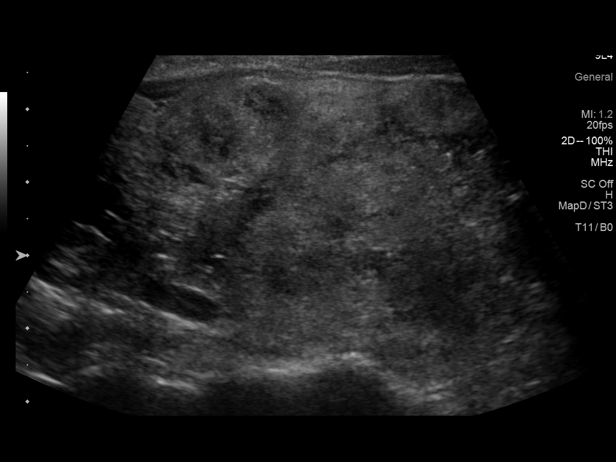
[im 30/59]
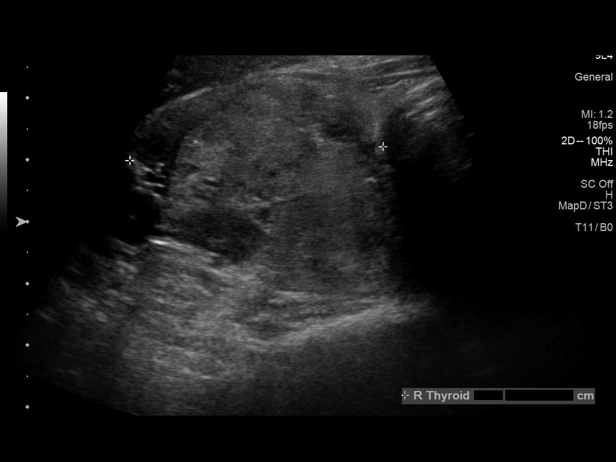
[im 34/59]
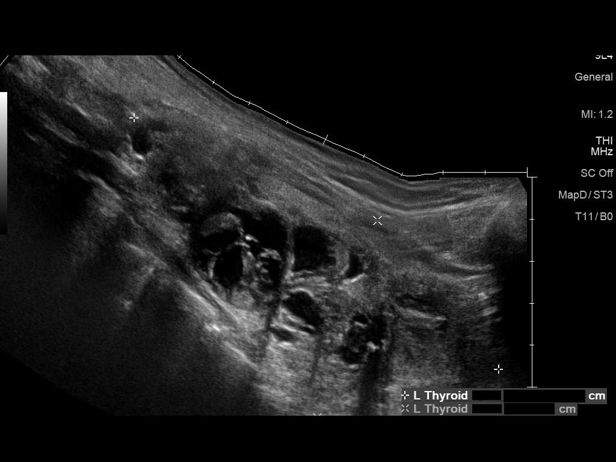
[im 37/59]
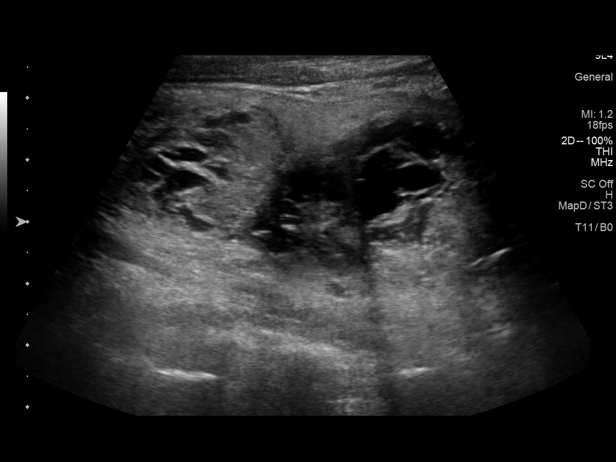
[im 42/59]
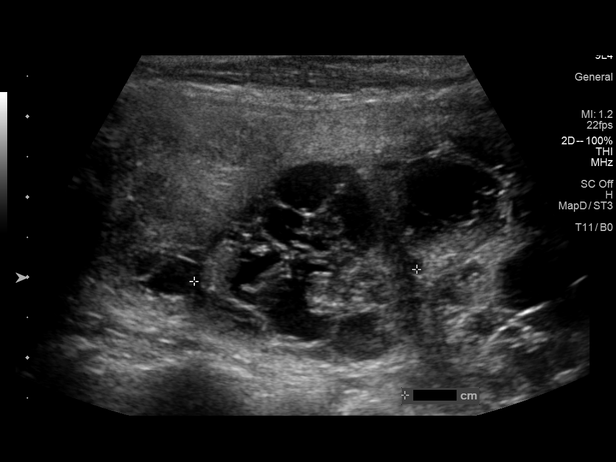
[im 46/59]
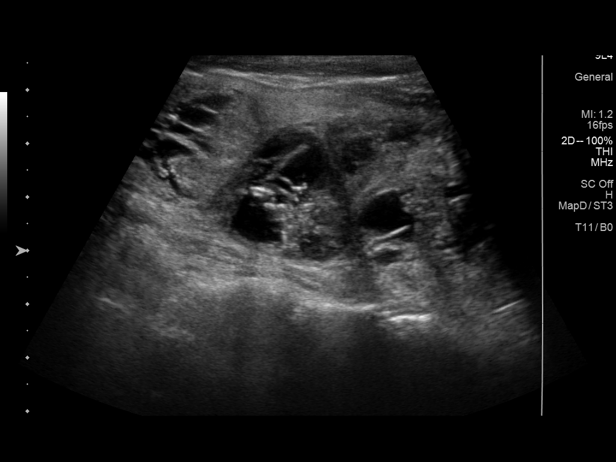
[im 49/59]
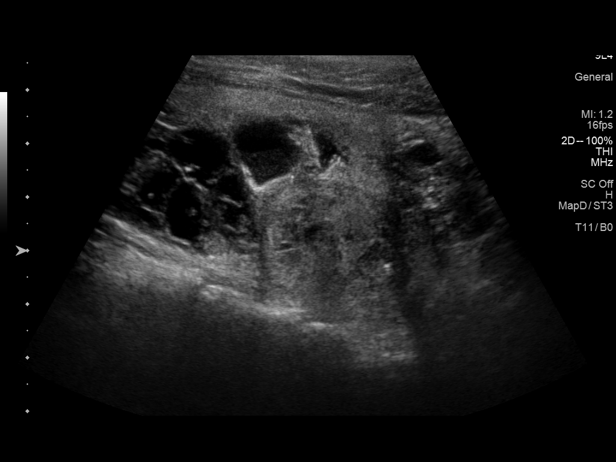
[im 54/59]
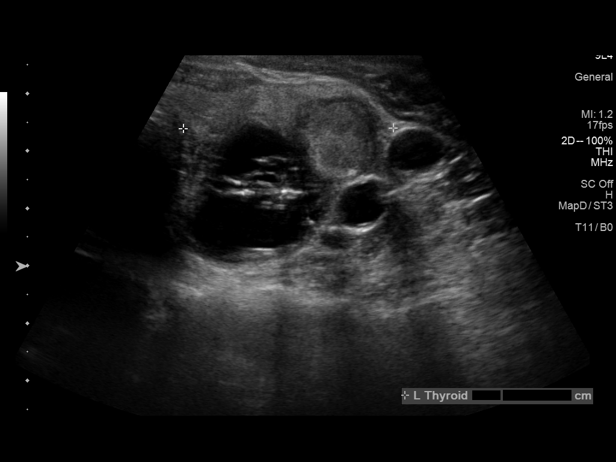
[im 59/59]
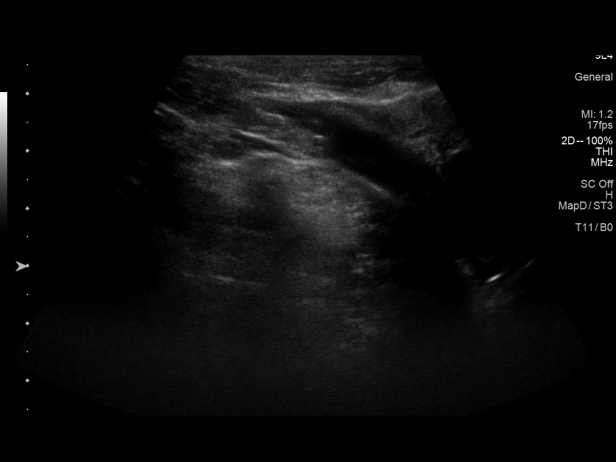

[15 of 25 positions shown; findings below may reference images not displayed]

FINDINGS: Parenchymal Echotexture: Markedly heterogenous

Isthmus: 0.6 cm

Right lobe: 9.1 x 4.0 x 4.1 cm

Left lobe: 10.5 x 4.9 x 3.7 cm

_________________________________________________________

Estimated total number of nodules >/= 1 cm: 9

Number of spongiform nodules >/=  2 cm not described below (TR1): 0

Number of mixed cystic and solid nodules >/= 1.5 cm not described
below (TR2): 0

_________________________________________________________

Nodule # 1:

Location: Isthmus;

Maximum size: 1.3 cm; Other 2 dimensions: 1.1 x 0.7 cm

Composition: mixed cystic and solid (1)

Echogenicity: isoechoic (1)

Shape: not taller-than-wide (0)

Margins: smooth (0)

Echogenic foci: none (0)

ACR TI-RADS total points: 2.

ACR TI-RADS risk category: TR2 (2 points).

ACR TI-RADS recommendations:

This nodule does NOT meet TI-RADS criteria for biopsy or dedicated
follow-up.

_________________________________________________________

Nodule # 2:

Location: Right; Superior

Maximum size: 2.9 cm; Other 2 dimensions: 2.9 x 2.6 cm

Composition: mixed cystic and solid (1)

Echogenicity: isoechoic (1)

Shape: not taller-than-wide (0)

Margins: smooth (0)

Echogenic foci: none (0)

ACR TI-RADS total points: 2.

ACR TI-RADS risk category: TR2 (2 points).

ACR TI-RADS recommendations:

This nodule does NOT meet TI-RADS criteria for biopsy or dedicated
follow-up.

_________________________________________________________

Nodule # 3:

Location: Right; Mid

Maximum size: 2.1 cm; Other 2 dimensions: 1.9 x 1.4 cm

Composition: cystic/almost completely cystic (0)

Echogenicity: anechoic (0)

Shape: not taller-than-wide (0)

Margins: smooth (0)

Echogenic foci: none (0)

ACR TI-RADS total points: 0.

ACR TI-RADS risk category: TR1 (0-1 points).

ACR TI-RADS recommendations:

This nodule does NOT meet TI-RADS criteria for biopsy or dedicated
follow-up.

_________________________________________________________

Nodule # 4:

Location: Right; Mid

Maximum size: 4.2 cm; Other 2 dimensions: 3.7 x 3.4 cm

Composition: solid/almost completely solid (2)

Echogenicity: isoechoic (1)

Shape: not taller-than-wide (0)

Margins: smooth (0)

Echogenic foci: punctate echogenic foci (3)

ACR TI-RADS total points: 6.

ACR TI-RADS risk category: TR4 (4-6 points).

ACR TI-RADS recommendations:

**Given size (>/= 1.5 cm) and appearance, fine needle aspiration of
this moderately suspicious nodule should be considered based on
TI-RADS criteria.

_________________________________________________________

Nodule # 5:

Location: Right; Inferior

Maximum size: 2.0 cm; Other 2 dimensions: 2.0 x 1.6 cm

Composition: solid/almost completely solid (2)

Echogenicity: cannot determine (1)

Shape: not taller-than-wide (0)

Margins: smooth (0)

Echogenic foci: macrocalcifications (1)

ACR TI-RADS total points: 4.

ACR TI-RADS risk category: TR4 (4-6 points).

ACR TI-RADS recommendations:

**Given size (>/= 1.5 cm) and appearance, fine needle aspiration of
this moderately suspicious nodule should be considered based on
TI-RADS criteria.

_________________________________________________________

Nodule # 6:

Location: Left; Superior

Maximum size: 2.4 cm; Other 2 dimensions: 2.2 x 1.9 cm

Composition: mixed cystic and solid (1)

Echogenicity: isoechoic (1)

Shape: not taller-than-wide (0)

Margins: smooth (0)

Echogenic foci: none (0)

ACR TI-RADS total points: 2.

ACR TI-RADS risk category: TR2 (2 points).

ACR TI-RADS recommendations:

This nodule does NOT meet TI-RADS criteria for biopsy or dedicated
follow-up.

_________________________________________________________

Nodule # 7:

Location: Left; Mid

Maximum size: 2.1 cm; Other 2 dimensions: 1.7 x 1.6 cm

Composition: solid/almost completely solid (2)

Echogenicity: hypoechoic (2)

Shape: not taller-than-wide (0)

Margins: smooth (0)

Echogenic foci: none (0)

ACR TI-RADS total points: 4.

ACR TI-RADS risk category: TR4 (4-6 points).

ACR TI-RADS recommendations:

**Given size (>/= 1.5 cm) and appearance, fine needle aspiration of
this moderately suspicious nodule should be considered based on
TI-RADS criteria.

_________________________________________________________

Nodule # 8:

Location: Left; Mid

Maximum size: 2.8 cm; Other 2 dimensions: 2.4 x 2.4 cm

Composition: spongiform (0)

Echogenicity: cannot determine (1)

Shape: not taller-than-wide (0)

Margins: smooth (0)

Echogenic foci: none (0)

ACR TI-RADS total points: 1.

ACR TI-RADS risk category: TR1 (0-1 points).

ACR TI-RADS recommendations:

This nodule does NOT meet TI-RADS criteria for biopsy or dedicated
follow-up.

_________________________________________________________

Nodule # 9:

Location: Left; Inferior

Maximum size: 3.4 cm; Other 2 dimensions: 2.8 x 2.2 cm

Composition: mixed cystic and solid (1)

Echogenicity: hypoechoic (2) (hypoechoic mural nodules)

Shape: not taller-than-wide (0)

Margins: smooth (0)

Echogenic foci: none (0)

ACR TI-RADS total points: 3.

ACR TI-RADS risk category: TR3 (3 points).

ACR TI-RADS recommendations:

**Given size (>/= 2.5 cm) and appearance, fine needle aspiration of
this mildly suspicious nodule should be considered based on TI-RADS
criteria.

_________________________________________________________
IMPRESSION: 1. Markedly enlarged multinodular goiter.
2. Nodule labeled 4, 5, 7 and 9 meet criteria for biopsy. Based on
ACR TI-RADS recommendations, only the 2 most suspicious thyroid
nodule should be biopsied at a single point in time. Based on
highest grade and size, nodules labeled 4 and 7 are recommended for
biopsy.
3. No other nodules identified meet criteria for further dedicated
follow-up or biopsy.

The above is in keeping with the ACR TI-RADS recommendations - [HOSPITAL] 1338;[DATE].

## 2022-03-23 NOTE — Progress Notes (Unsigned)
Enrolled for [employer] to mail a ZIO XT long term holter monitor to the patients address on file.  

## 2022-03-23 NOTE — Progress Notes (Signed)
?Cardiology Office Note:   ? ?Date:  03/23/2022  ? ?ID:  Gregg [name], DOB Oct 11, 1956, MRN [id number] ? ?PCP:  Camillia Herter, NP ?  ?[facility] Providers ?Cardiologist:  None    ? ?Referring MD: Camillia Herter, NP  ? ?No chief complaint on file. ?RBBB ? ?History of Present Illness:   ? ?Gregg [name] is a 66 y.o. male with a hx of HTN, anxiety/depression, HLD, hyperthyroidism/thyroid goiter, referral for RBBB ? ?He notes that his heart rates have been high. He has hyperthyroidism with TSH that was low. He is on methimazole. His TSH has normalized. He notes that his heart rates can just jump while sitting and watching TV. He is asymptomatic. His apple watch beeps and tells him. He denies syncope. No cardiac dx history. His father has hx of afib. His oldest sister had a stroke/afib. His other siblings have atrial fibrillation. His mother had hx of thyroid dx. He has normal crt. No DM2. No PAD. No PCI.  LDL 80 mg/dL 10/21/2021. He notes his blood pressures are typically in the 166A systolic. ? ?Past Medical History:  ?Diagnosis Date  ? Anxiety   ? Bilateral inguinal hernia 06/02/2021  ? GERD (gastroesophageal reflux disease)   ? History of kidney stones 2020  ? Hypertension   ? Sinus complaint 06/02/2021  ? x 1 week per pt  ? Wears glasses 06/02/2021  ? ? ?Past Surgical History:  ?Procedure Laterality Date  ? COLONOSCOPY WITH PROPOFOL N/A 12/31/2021  ? Procedure: COLONOSCOPY WITH PROPOFOL;  Surgeon: Lavena Bullion, DO;  Location: [facility];  Service: Gastroenterology;  Laterality: N/A;  ? ENDOSCOPIC MUCOSAL RESECTION N/A 12/31/2021  ? Procedure: ENDOSCOPIC MUCOSAL RESECTION;  Surgeon: Lavena Bullion, DO;  Location: [facility];  Service: Gastroenterology;  Laterality: N/A;  ? EXTRACORPOREAL SHOCK WAVE LITHOTRIPSY Left 02/23/2019  ? Procedure: EXTRACORPOREAL SHOCK WAVE LITHOTRIPSY (ESWL);  Surgeon: Bjorn Loser, MD;  Location: [facility];  Service: Urology;  Laterality: Left;  ? HEMOSTASIS  CLIP PLACEMENT  12/31/2021  ? Procedure: HEMOSTASIS CLIP PLACEMENT;  Surgeon: Lavena Bullion, DO;  Location: [facility];  Service: Gastroenterology;;  ? INGUINAL HERNIA REPAIR Bilateral 06/06/2021  ? Procedure: LAPAROSCOPIC BILATERAL INGUINAL HERNIA REPAIRS WITH MESH;  Surgeon: Michael Boston, MD;  Location: [place];  Service: General;  Laterality: Bilateral;  TAP BLOCK  ? LAPAROSCOPIC APPENDECTOMY N/A 03/07/2021  ? Procedure: APPENDECTOMY LAPAROSCOPIC;  Surgeon: Michael Boston, MD;  Location: [facility];  Service: General;  Laterality: N/A;  ? SUBMUCOSAL LIFTING INJECTION  12/31/2021  ? Procedure: SUBMUCOSAL LIFTING INJECTION;  Surgeon: Lavena Bullion, DO;  Location: [facility];  Service: Gastroenterology;;  ? ? ?Current Medications: ?No outpatient medications have been marked as taking for the 03/23/22 encounter (Appointment) with Janina Mayo, MD.  ?  ? ?Allergies:   Patient has no known allergies.  ? ?Social History  ? ?Socioeconomic History  ? Marital status: Single  ?  Spouse name: Not on file  ? Number of children: Not on file  ? Years of education: Not on file  ? Highest education level: Not on file  ?Occupational History  ? Not on file  ?Tobacco Use  ? Smoking status: Former  ?  Packs/day: 0.50  ?  Years: 40.00  ?  Pack years: 20.00  ?  Types: Cigarettes  ?  Quit date: 03/29/2017  ?  Years since quitting: 4.9  ? Smokeless tobacco: Never  ?Vaping Use  ? Vaping Use: Never used  ?  Substance and Sexual Activity  ? Alcohol use: Yes  ?  Comment: rare  ? Drug use: Never  ? Sexual activity: Not on file  ?Other Topics Concern  ? Not on file  ?Social History Narrative  ? Not on file  ? ?Social Determinants of Health  ? ?Financial Resource Strain: Not on file  ?Food Insecurity: Not on file  ?Transportation Needs: Not on file  ?Physical Activity: Not on file  ?Stress: Not on file  ?Social Connections: Not on file  ?  ? ?Family History: ?The patient's family history includes Thyroid disease in his  father, mother, and sister. There is no history of Stomach cancer, Colon cancer, Esophageal cancer, or Rectal cancer. ? ?ROS:   ?Please see the history of present illness.    ? All other systems reviewed and are negative. ? ?EKGs/Labs/Other Studies Reviewed:   ? ?The following studies were reviewed today: ? ? ?EKG:  EKG is  ordered today.  The ekg ordered today demonstrates  ? ?03/23/2022-NSR, RBBB ? ?Recent Labs: ?01/08/2022: ALT 43; BUN 20; Creatinine, Ser 1.13; Hemoglobin 14.6; Platelets 233; Potassium 3.4; Sodium 136 ?01/13/2022: TSH 0.55  ?Recent Lipid Panel ?   ?Component Value Date/Time  ? CHOL 138 10/21/2021 0932  ? TRIG 139 10/21/2021 0932  ? HDL 33 (L) 10/21/2021 0932  ? CHOLHDL 4.2 10/21/2021 0932  ? [place] 80 10/21/2021 0932  ? ? ? ?Risk Assessment/Calculations:   ?  ? ?    ? ?Physical Exam:   ? ?VS: ? ?Vitals:  ? 03/23/22 0815  ?BP: (!) 160/78  ?Pulse: 93  ?SpO2: 93%  ? ? ? ?Wt Readings from Last 3 Encounters:  ?03/17/22 173 lb (78.5 kg)  ?01/13/22 169 lb 9.6 oz (76.9 kg)  ?01/08/22 168 lb (76.2 kg)  ?  ? ?GEN:  Well nourished, well developed in no acute distress ?HEENT: Normal ?NECK: No JVD; No carotid bruits ?LYMPHATICS: No lymphadenopathy ?CARDIAC: RRR, no murmurs, rubs, gallops ?RESPIRATORY:  Clear to auscultation without rales, wheezing or rhonchi  ?ABDOMEN: Soft, non-tender, non-distended ?MUSCULOSKELETAL:  No edema; No deformity  ?SKIN: Warm and dry ?NEUROLOGIC:  Alert and oriented x 3 ?PSYCHIATRIC:  Normal affect  ? ?ASSESSMENT:   ? ?RBBB: this is typically benign. He is asymptomatic. He has no significant fixed split S2 , no signs of RV enlargement. Unlikely related to significant ASD.  ? ?Palpitations: considering age, thyroid dx, and family hx. Will obtain 7 day ziopatch to ensure no signs of afib ? ?HLD: continue lipitor 40 mg daily ? ?PLAN:   ? ?In order of problems listed above: ? ?7 day ziopatch ?Follow up 3 months ? ?   ? ?   ? ? ?Medication Adjustments/Labs and Tests Ordered: ?Current  medicines are reviewed at length with the patient today.  Concerns regarding medicines are outlined above.  ?No orders of the defined types were placed in this encounter. ? ?No orders of the defined types were placed in this encounter. ? ? ?There are no Patient Instructions on file for this visit.  ? ?Signed, ?Janina Mayo, MD  ?03/23/2022 7:49 AM    ?[place] ?

## 2022-03-23 NOTE — Patient Instructions (Signed)
Medication Instructions:  ?No Changes In Medications at this time.  ?*If you need a refill on your cardiac medications before your next appointment, please call your pharmacy* ? ?Testing/Procedures: ? ?ZIO XT- Long Term Monitor Instructions  ? ?Your physician has requested you wear your ZIO patch monitor___7____days.  ? ?This is a single patch monitor.  [employer] supplies one patch monitor per enrollment.  Additional stickers are not available. ?  ?Please do not apply patch if you will be having a Nuclear Stress Test, Echocardiogram, Cardiac CT, MRI, or Chest Xray during the time frame you would be wearing the monitor. The patch cannot be worn during these tests.  You cannot remove and re-apply the ZIO XT patch monitor. ?  ?Your ZIO patch monitor will be sent USPS Priority mail from [facility] directly to your home address. The monitor may also be mailed to a PO BOX if home delivery is not available.   It may take 3-5 days to receive your monitor after you have been enrolled. ?  ?Once you have received you monitor, please review enclosed instructions.  Your monitor has already been registered assigning a specific monitor serial # to you. ?  ?Applying the monitor  ? ?Shave hair from upper left chest. ?  ?Hold abrader disc by orange tab.  Rub abrader in 40 strokes over left upper chest as indicated in your monitor instructions. ?  ?Clean area with 4 enclosed alcohol pads .  Use all pads to assure are is cleaned thoroughly.  Let dry.  ? ?Apply patch as indicated in monitor instructions.  Patch will be place under collarbone on left side of chest with arrow pointing upward. ?  ?Rub patch adhesive wings for 2 minutes.Remove white label marked "1".  Remove white label marked "2".  Rub patch adhesive wings for 2 additional minutes. ?  ?While looking in a mirror, press and release button in center of patch.  A small green light will flash 3-4 times .  This will be your only indicator the monitor has been turned  on. ?    ?Do not shower for the first 24 hours.  You may shower after the first 24 hours. ?  ?Press button if you feel a symptom. You will hear a small click.  Record Date, Time and Symptom in the Patient Log Book. ?  ?When you are ready to remove patch, follow instructions on last 2 pages of Patient Log Book.  Stick patch monitor onto last page of Patient Log Book. ?  ?Place Patient Log Book in [facility] box.  Use locking tab on box and tape box closed securely.  The [employer] has [employer] on it.  Please place in mailbox as soon as possible.  Your physician should have your test results approximately 7 days after the monitor has been mailed back to [facility]. ?  ?Call [facility] at [phone] if you have questions regarding your ZIO XT patch monitor.  Call them immediately if you see an orange light blinking on your monitor. ?  ?If your monitor falls off in less than 4 days contact our Monitor department at [phone].  If your monitor becomes loose or falls off after 4 days call [employer] at [phone] for suggestions on securing your monitor.  ? ?Follow-Up: ?At [facility], you and your health needs are our priority.  As part of our continuing mission to provide you with exceptional heart care, we have created designated Provider Care Teams.  These Care Teams include your primary Cardiologist (physician) and Advanced Practice Providers (APPs -  Physician Assistants and Nurse Practitioners) who all work together to provide you with the care you need, when you need it. ? ?Your next appointment:   ?3 month(s) ? ?The format for your next appointment:   ?In Person ? ?Provider:   ?Janina Mayo, MD   ?

## 2022-04-02 ENCOUNTER — Telehealth: Payer: BC Managed Care – PPO | Admitting: Family Medicine

## 2022-04-02 DIAGNOSIS — J069 Acute upper respiratory infection, unspecified: Secondary | ICD-10-CM | POA: Diagnosis not present

## 2022-04-02 MED ORDER — FLUTICASONE PROPIONATE 50 MCG/ACT NA SUSP: 2.0000 | Freq: Every day | NASAL | 0 refills | Status: DC

## 2022-04-02 MED ORDER — ALBUTEROL SULFATE HFA 108 (90 BASE) MCG/ACT IN AERS: 2.0000 | INHALATION_SPRAY | Freq: Four times a day (QID) | RESPIRATORY_TRACT | 0 refills | Status: DC | PRN

## 2022-04-02 MED ORDER — BENZONATATE 100 MG PO CAPS: 100.0000 mg | ORAL_CAPSULE | Freq: Two times a day (BID) | ORAL | 0 refills | Status: DC | PRN

## 2022-04-02 NOTE — Addendum Note (Signed)
Addended by: Perlie Mayo on: 04/02/2022 02:31 PM ? ? Modules accepted: Orders ? ?

## 2022-04-02 NOTE — Progress Notes (Signed)
E-Visit for Upper Respiratory Infection  ? ?We are sorry you are not feeling well.  Here is how we plan to help! ? ?Based on what you have shared with me, it looks like you may have a viral upper respiratory infection.  Upper respiratory infections are caused by a large number of viruses; however, rhinovirus is the most common cause.  ? ?Symptoms vary from person to person, with common symptoms including sore throat, cough, fatigue or lack of energy and feeling of general discomfort.  A low-grade fever of up to 100.4 may present, but is often uncommon.  Symptoms vary however, and are closely related to a person's age or underlying illnesses.  The most common symptoms associated with an upper respiratory infection are nasal discharge or congestion, cough, sneezing, headache and pressure in the ears and face.  These symptoms usually persist for about 3 to 10 days, but can last up to 2 weeks.  It is important to know that upper respiratory infections do not cause serious illness or complications in most cases.   ? ?Upper respiratory infections can be transmitted from person to person, with the most common method of transmission being a person's hands.  The virus is able to live on the skin and can infect other persons for up to 2 hours after direct contact.  Also, these can be transmitted when someone coughs or sneezes; thus, it is important to cover the mouth to reduce this risk.  To keep the spread of the illness at [place], good hand hygiene is very important. ? ?This is an infection that is most likely caused by a virus. There are no specific treatments other than to help you with the symptoms until the infection runs its course.  We are sorry you are not feeling well.  Here is how we plan to help! ? ? ?For nasal congestion, you may use an oral decongestants such as Mucinex D or if you have glaucoma or high blood pressure use plain Mucinex.  Saline nasal spray or nasal drops can help and can safely be used as often as  needed for congestion.  For your congestion, I have prescribed Fluticasone nasal spray one spray in each nostril twice a day ? ?If you do not have a history of heart disease, hypertension, diabetes or thyroid disease, prostate/bladder issues or glaucoma, you may also use Sudafed to treat nasal congestion.  It is highly recommended that you consult with a pharmacist or your primary care physician to ensure this medication is safe for you to take.    ? ?If you have a cough, you may use cough suppressants such as Delsym and Robitussin.  If you have glaucoma or high blood pressure, you can also use Coricidin HBP.   ?For cough I have prescribed for you A prescription cough medication called Tessalon Perles 100 mg. You may take 1-2 capsules every 8 hours as needed for cough ? ?If you have a sore or scratchy throat, use a saltwater gargle- ? to ? teaspoon of salt dissolved in a 4-ounce to 8-ounce glass of warm water.  Gargle the solution for approximately 15-30 seconds and then spit.  It is important not to swallow the solution.  You can also use throat lozenges/cough drops and Chloraseptic spray to help with throat pain or discomfort.  Warm or cold liquids can also be helpful in relieving throat pain. ? ?For headache, pain or general discomfort, you can use Ibuprofen or Tylenol as directed.   ?Some authorities believe  that zinc sprays or the use of Echinacea may shorten the course of your symptoms. ? ? ?HOME CARE ?Only take medications as instructed by your medical team. ?Be sure to drink plenty of fluids. Water is fine as well as fruit juices, sodas and electrolyte beverages. You may want to stay away from caffeine or alcohol. If you are nauseated, try taking small sips of liquids. How do you know if you are getting enough fluid? Your urine should be a pale yellow or almost colorless. ?Get rest. ?Taking a steamy shower or using a humidifier may help nasal congestion and ease sore throat pain. You can place a towel over  your head and breathe in the steam from hot water coming from a faucet. ?Using a saline nasal spray works much the same way. ?Cough drops, hard candies and sore throat lozenges may ease your cough. ?Avoid close contacts especially the very young and the elderly ?Cover your mouth if you cough or sneeze ?Always remember to wash your hands.  ? ?GET HELP RIGHT AWAY IF: ?You develop worsening fever. ?If your symptoms do not improve within 10 days ?You have coughing fits ?You develop a severe head ache or visual changes. ?You develop shortness of breath, difficulty breathing or start having chest pain ?Your symptoms persist after you have completed your treatment plan ? ?MAKE SURE YOU  ?Understand these instructions. ?Will watch your condition. ?Will get help right away if you are not doing well or get worse. ? ?Thank you for choosing an e-visit. ? ?Your e-visit answers were reviewed by a board certified advanced clinical practitioner to complete your personal care plan. Depending upon the condition, your plan could have included both over the counter or prescription medications. ? ?Please review your pharmacy choice. Make sure the pharmacy is open so you can pick up prescription now. If there is a problem, you may contact your provider through [employer] and have the prescription routed to another pharmacy.  Your safety is important to [place]. If you have drug allergies check your prescription carefully.  ? ?For the next 24 hours you can use MyChart to ask questions about today's visit, request a non-urgent call back, or ask for a work or school excuse. ?You will get an email in the next two days asking about your experience. I hope that your e-visit has been valuable and will speed your recovery. ? ? ? ?I provided 5 minutes of non face-to-face time during this encounter for chart review, medication and order placement, as well as and documentation.  ? ?

## 2022-04-06 ENCOUNTER — Telehealth: Payer: BC Managed Care – PPO | Admitting: Nurse Practitioner

## 2022-04-06 DIAGNOSIS — R053 Chronic cough: Secondary | ICD-10-CM

## 2022-04-06 NOTE — Progress Notes (Signed)
Gregg [name], ? ?I am concerned that you have had a recurrent upper respiratory infection since February. Have you been to your primary care or evaluated in person throughout any of these illnesses?  ? ?At this point I would recommend you have a chest Xray done. Unless this has happened at an outside facility and I cannot see it, this is the best option. Recurrent coughs need more workup, and in person evaluation.  ? ?I am including some urgent care locations if your primary care cannot see you in the next few days, you should visit an urgent care.  ? ?I am sorry for the inconvenience, we just want to assure you are getting the best care.  ? ?Thank you ?Apolonio Schneiders FNP-C  ? ?Based on what you shared with me, I feel your condition warrants further evaluation and I recommend that you be seen in a face to face visit. ?  ?NOTE: There will be NO CHARGE for this eVisit ?  ?If you are having a true medical emergency please call 911.   ?  ? For an urgent face to face visit, [facility] has six urgent care centers for your convenience:  ?  ? [facility] ?Get Driving Directions ?[phone] ?[address] ?[place], [place] [address] ?  ? [facility]) ?Get Driving Directions ?[phone] ?[address] ?[place], [place] [address] ? ?[facility] ([place]) ?Get Driving Directions ?[place],  [place]  [address] ? ?[facility] ?Get Driving Directions ?[phone] ?[address], [address] ?[place], [place] [address] ?  ?[facility] ?Get Driving Directions  ?[phone] ?[address] ?[address] ?[place], [place] [address] ?  ?[facility] ?Get Driving Directions ?[phone] ?[address] ?[place], [place] [address] ? ?Your MyChart E-visit questionnaire answers were reviewed by  a board certified advanced clinical practitioner to complete your personal care plan based on your specific symptoms.  Thank you for using e-Visits. ?

## 2022-04-08 ENCOUNTER — Encounter: Payer: Self-pay | Admitting: Internal Medicine

## 2022-04-13 ENCOUNTER — Ambulatory Visit: Payer: BC Managed Care – PPO

## 2022-04-14 ENCOUNTER — Ambulatory Visit (INDEPENDENT_AMBULATORY_CARE_PROVIDER_SITE_OTHER): Payer: BC Managed Care – PPO | Admitting: Endocrinology

## 2022-04-14 VITALS — BP 122/78 | HR 87 | Ht 69.0 in | Wt 171.6 lb

## 2022-04-14 DIAGNOSIS — E0590 Thyrotoxicosis, unspecified without thyrotoxic crisis or storm: Secondary | ICD-10-CM

## 2022-04-14 DIAGNOSIS — E042 Nontoxic multinodular goiter: Secondary | ICD-10-CM

## 2022-04-14 LAB — T4, FREE: Free T4: 0.82 ng/dL (ref 0.60–1.60)

## 2022-04-14 LAB — TSH: TSH: 0.66 u[IU]/mL (ref 0.35–5.50)

## 2022-04-14 NOTE — Progress Notes (Signed)
? ?Subjective:  ? ? Patient ID: Gregg [name], male    DOB: Jul 05, 1956, 66 y.o.   MRN: [id number] ? ?HPI ?Pt returns for f/u of hyperthyroidism (dx'ed 2022; US showed MNG; bx showed MNG--bx was advised; we discussed bx vs [place] f/u; he declined bx).  pt states he feels well in general.    ?Past Medical History:  ?Diagnosis Date  ? Anxiety   ? Bilateral inguinal hernia 06/02/2021  ? GERD (gastroesophageal reflux disease)   ? History of kidney stones 2020  ? Hypertension   ? Sinus complaint 06/02/2021  ? x 1 week per pt  ? Wears glasses 06/02/2021  ? ? ?Past Surgical History:  ?Procedure Laterality Date  ? COLONOSCOPY WITH PROPOFOL N/A 12/31/2021  ? Procedure: COLONOSCOPY WITH PROPOFOL;  Surgeon: Lavena Bullion, DO;  Location: [facility];  Service: Gastroenterology;  Laterality: N/A;  ? ENDOSCOPIC MUCOSAL RESECTION N/A 12/31/2021  ? Procedure: ENDOSCOPIC MUCOSAL RESECTION;  Surgeon: Lavena Bullion, DO;  Location: [facility];  Service: Gastroenterology;  Laterality: N/A;  ? EXTRACORPOREAL SHOCK WAVE LITHOTRIPSY Left 02/23/2019  ? Procedure: EXTRACORPOREAL SHOCK WAVE LITHOTRIPSY (ESWL);  Surgeon: Bjorn Loser, MD;  Location: [facility];  Service: Urology;  Laterality: Left;  ? HEMOSTASIS CLIP PLACEMENT  12/31/2021  ? Procedure: HEMOSTASIS CLIP PLACEMENT;  Surgeon: Lavena Bullion, DO;  Location: [facility];  Service: Gastroenterology;;  ? INGUINAL HERNIA REPAIR Bilateral 06/06/2021  ? Procedure: LAPAROSCOPIC BILATERAL INGUINAL HERNIA REPAIRS WITH MESH;  Surgeon: Michael Boston, MD;  Location: [facility];  Service: General;  Laterality: Bilateral;  TAP BLOCK  ? LAPAROSCOPIC APPENDECTOMY N/A 03/07/2021  ? Procedure: APPENDECTOMY LAPAROSCOPIC;  Surgeon: Michael Boston, MD;  Location: [facility];  Service: General;  Laterality: N/A;  ? SUBMUCOSAL LIFTING INJECTION  12/31/2021  ? Procedure: SUBMUCOSAL LIFTING INJECTION;  Surgeon: Lavena Bullion, DO;  Location: [facility];  Service:  Gastroenterology;;  ? ? ?Social History  ? ?Socioeconomic History  ? Marital status: Single  ?  Spouse name: Not on file  ? Number of children: Not on file  ? Years of education: Not on file  ? Highest education level: Not on file  ?Occupational History  ? Not on file  ?Tobacco Use  ? Smoking status: Former  ?  Packs/day: 0.50  ?  Years: 40.00  ?  Pack years: 20.00  ?  Types: Cigarettes  ?  Quit date: 03/29/2017  ?  Years since quitting: 5.0  ? Smokeless tobacco: Never  ?Vaping Use  ? Vaping Use: Never used  ?Substance and Sexual Activity  ? Alcohol use: Yes  ?  Comment: rare  ? Drug use: Never  ? Sexual activity: Not on file  ?Other Topics Concern  ? Not on file  ?Social History Narrative  ? Not on file  ? ?Social Determinants of Health  ? ?Financial Resource Strain: Not on file  ?Food Insecurity: Not on file  ?Transportation Needs: Not on file  ?Physical Activity: Not on file  ?Stress: Not on file  ?Social Connections: Not on file  ?Intimate Partner Violence: Not on file  ? ? ?Current Outpatient Medications on File Prior to Visit  ?Medication Sig Dispense Refill  ? albuterol (VENTOLIN HFA) 108 (90 Base) MCG/ACT inhaler Inhale 2 puffs into the lungs every 6 (six) hours as needed for wheezing or shortness of breath. 8 g 0  ? atorvastatin (LIPITOR) 40 MG tablet Take 1 tablet (40 mg total) by mouth daily. 120 tablet 0  ? fluticasone (  FLONASE) 50 MCG/ACT nasal spray Place 2 sprays into both nostrils daily. 16 g 0  ? levocetirizine (XYZAL) 5 MG tablet Take 5 mg by mouth every evening.    ? losartan-hydrochlorothiazide (HYZAAR) 50-12.5 MG tablet Take 1 tablet by mouth daily. 120 tablet 0  ? methimazole (TAPAZOLE) 5 MG tablet TAKE 0.5 TABLETS (2.5 MG TOTAL) BY MOUTH 3 (THREE) TIMES A WEEK. (Patient taking differently: Take 2.5 mg by mouth every other day.) 20 tablet 1  ? Multiple Vitamins-Minerals (MULTI COMPLETE PO) Take 1 tablet by mouth daily.    ? MYRBETRIQ 50 MG TB24 tablet Take 50 mg by mouth daily.    ? sertraline  (ZOLOFT) 25 MG tablet Take 1 tablet (25 mg total) by mouth daily. 120 tablet 0  ? benzonatate (TESSALON) 100 MG capsule Take 1 capsule (100 mg total) by mouth 2 (two) times daily as needed for cough. 20 capsule 0  ? ?No current facility-administered medications on file prior to visit.  ? ? ?No Known Allergies ? ?Family History  ?Problem Relation Age of Onset  ? Thyroid disease Mother   ? Thyroid disease Father   ? Thyroid disease Sister   ? Stomach cancer Neg Hx   ? Colon cancer Neg Hx   ? Esophageal cancer Neg Hx   ? Rectal cancer Neg Hx   ? ? ?BP 122/78 (BP Location: Left Arm, Patient Position: Sitting, Cuff Size: Normal)   Pulse 87   Ht '5\' 9"'$  (1.753 m)   Wt 171 lb 9.6 oz (77.8 kg)   SpO2 92%   BMI 25.34 kg/m?  ? ? ?Review of Systems ?He denies fever.   ?   ?Objective:  ? Physical Exam ?VITAL SIGNS:  See vs page.   ?GENERAL: no distress.   ?NECK: 3 cm right and 1-2 cm left thyroid nodules are palpable.  ? ? ?Lab Results  ?Component Value Date  ? TSH 0.66 04/14/2022  ? ?   ?Assessment & Plan:  ?Hyperthyroidism: well-controlled.  Please continue the same methimazole.   ?MNG: due for recheck this year.  I ordered. ? ?

## 2022-04-14 NOTE — Patient Instructions (Addendum)
Blood tests are requested for you today.  We'll let you know about the results.   ?If ever you have fever while taking methimazole, stop it and call us, even if the reason is obvious, because of the risk of a rare side-effect.   ?It is best to never miss the medication.  However, if you do miss it, next best is to double up the next time.   ?Let's recheck the ultrasound.  Please do in December.  you will receive a phone call, about a day and time for an appointment. ?You should have an endocrinology follow-up appointment in 4 months.   ?

## 2022-04-16 ENCOUNTER — Emergency Department (HOSPITAL_COMMUNITY): Payer: BC Managed Care – PPO

## 2022-04-16 ENCOUNTER — Encounter (HOSPITAL_COMMUNITY): Payer: Self-pay | Admitting: Emergency Medicine

## 2022-04-16 ENCOUNTER — Emergency Department (HOSPITAL_COMMUNITY)
Admission: EM | Admit: 2022-04-16 | Discharge: 2022-04-16 | Disposition: A | Discharge status: Home / Self Care | Payer: BC Managed Care – PPO | Attending: Emergency Medicine | Admitting: Emergency Medicine

## 2022-04-16 ENCOUNTER — Other Ambulatory Visit: Payer: Self-pay

## 2022-04-16 ENCOUNTER — Ambulatory Visit
Admission: RE | Admit: 2022-04-16 | Discharge: 2022-04-16 | Disposition: A | Discharge status: Home / Self Care | Payer: BC Managed Care – PPO | Source: Ambulatory Visit | Attending: Internal Medicine | Admitting: Internal Medicine

## 2022-04-16 VITALS — BP 149/72 | HR 103 | Temp 98.0°F | Resp 22 | Ht 69.0 in | Wt 171.0 lb

## 2022-04-16 DIAGNOSIS — J40 Bronchitis, not specified as acute or chronic: Secondary | ICD-10-CM | POA: Diagnosis not present

## 2022-04-16 DIAGNOSIS — R053 Chronic cough: Secondary | ICD-10-CM | POA: Diagnosis not present

## 2022-04-16 DIAGNOSIS — J069 Acute upper respiratory infection, unspecified: Secondary | ICD-10-CM | POA: Diagnosis not present

## 2022-04-16 DIAGNOSIS — R0602 Shortness of breath: Secondary | ICD-10-CM | POA: Diagnosis present

## 2022-04-16 DIAGNOSIS — Z20822 Contact with and (suspected) exposure to covid-19: Secondary | ICD-10-CM | POA: Insufficient documentation

## 2022-04-16 DIAGNOSIS — Z87891 Personal history of nicotine dependence: Secondary | ICD-10-CM | POA: Diagnosis not present

## 2022-04-16 LAB — CBC WITH DIFFERENTIAL/PLATELET
Abs Immature Granulocytes: 0.01 10*3/uL (ref 0.00–0.07)
Basophils Absolute: 0.1 10*3/uL (ref 0.0–0.1)
Basophils Relative: 1 %
Eosinophils Absolute: 0.2 10*3/uL (ref 0.0–0.5)
Eosinophils Relative: 3 %
HCT: 49.8 % (ref 39.0–52.0)
Hemoglobin: 17.2 g/dL — ABNORMAL HIGH (ref 13.0–17.0)
Immature Granulocytes: 0 %
Lymphocytes Relative: 20 %
Lymphs Abs: 1.4 10*3/uL (ref 0.7–4.0)
MCH: 32.1 pg (ref 26.0–34.0)
MCHC: 34.5 g/dL (ref 30.0–36.0)
MCV: 93.1 fL (ref 80.0–100.0)
Monocytes Absolute: 0.6 10*3/uL (ref 0.1–1.0)
Monocytes Relative: 8 %
Neutro Abs: 5 10*3/uL (ref 1.7–7.7)
Neutrophils Relative %: 68 %
Platelets: 242 10*3/uL (ref 150–400)
RBC: 5.35 MIL/uL (ref 4.22–5.81)
RDW: 12.9 % (ref 11.5–15.5)
WBC: 7.4 10*3/uL (ref 4.0–10.5)
nRBC: 0 % (ref 0.0–0.2)

## 2022-04-16 LAB — BASIC METABOLIC PANEL
Anion gap: 10 (ref 5–15)
BUN: 16 mg/dL (ref 8–23)
CO2: 26 mmol/L (ref 22–32)
Calcium: 9.7 mg/dL (ref 8.9–10.3)
Chloride: 102 mmol/L (ref 98–111)
Creatinine, Ser: 0.83 mg/dL (ref 0.61–1.24)
GFR, Estimated: >60 mL/min (ref >60)
Glucose, Bld: 109 mg/dL — ABNORMAL HIGH (ref 70–99)
Potassium: 4 mmol/L (ref 3.5–5.1)
Sodium: 138 mmol/L (ref 135–145)

## 2022-04-16 LAB — D-DIMER, QUANTITATIVE: D-Dimer, Quant: 0.29 ug/mL-FEU (ref 0.00–0.50)

## 2022-04-16 LAB — RESP PANEL BY RT-PCR (FLU A&B, COVID) ARPGX2
Influenza A by PCR: NEGATIVE
Influenza B by PCR: NEGATIVE
SARS Coronavirus 2 by RT PCR: NEGATIVE

## 2022-04-16 LAB — TROPONIN I (HIGH SENSITIVITY): Troponin I (High Sensitivity): 6 ng/L (ref <18)

## 2022-04-16 LAB — BRAIN NATRIURETIC PEPTIDE: B Natriuretic Peptide: 22.2 pg/mL (ref 0.0–100.0)

## 2022-04-16 MED ORDER — ALBUTEROL SULFATE (2.5 MG/3ML) 0.083% IN NEBU
2.5000 mg | INHALATION_SOLUTION | Freq: Once | RESPIRATORY_TRACT | Status: AC
  Administered 2022-04-16: 2.5 mg via RESPIRATORY_TRACT

## 2022-04-16 MED ORDER — ALBUTEROL SULFATE HFA 108 (90 BASE) MCG/ACT IN AERS
6.0000 | INHALATION_SPRAY | Freq: Once | RESPIRATORY_TRACT | Status: AC
  Administered 2022-04-16: 6 via RESPIRATORY_TRACT
  Filled 2022-04-16: qty 6.7

## 2022-04-16 MED ORDER — ALBUTEROL SULFATE HFA 108 (90 BASE) MCG/ACT IN AERS: 2.0000 | INHALATION_SPRAY | RESPIRATORY_TRACT | Status: DC | PRN

## 2022-04-16 MED ORDER — AZITHROMYCIN 250 MG PO TABS: 250.0000 mg | ORAL_TABLET | Freq: Every day | ORAL | 0 refills | Status: DC

## 2022-04-16 MED ORDER — PREDNISONE 20 MG PO TABS
60.0000 mg | ORAL_TABLET | Freq: Once | ORAL | Status: AC
  Administered 2022-04-16: 60 mg via ORAL
  Filled 2022-04-16: qty 3

## 2022-04-16 MED ORDER — PREDNISONE 20 MG PO TABS: 60.0000 mg | ORAL_TABLET | Freq: Every day | ORAL | 0 refills | Status: AC

## 2022-04-16 NOTE — ED Triage Notes (Signed)
Patient c/o chest congestion for several weeks, treated for bronchitis, no better, SOB, tried to see PCP, no appts, requesting a CXR. ?

## 2022-04-16 NOTE — Discharge Instructions (Signed)
Please go to the emergency department as soon as you leave urgent care for further evaluation and management. ?

## 2022-04-16 NOTE — Discharge Instructions (Addendum)
Take albuterol inhaler with 4 puffs every 4 hours for the next 24 hours and then as needed.  Take your next dose of steroid tomorrow.  Take your dose antibiotic at home.  Follow-up with your primary care doctor but I have also provided you with information to follow-up with pulmonologist. ?

## 2022-04-16 NOTE — ED Notes (Signed)
Placed pt on 2 L/M nasal cannula due to SpO2 readings RA was 88-90% with good waveform. ?

## 2022-04-16 NOTE — ED Triage Notes (Signed)
Pt reports being sent from UC. Pt reports chest congestion x a few weeks and SHOB. Pt reports worsening w/ exertion O2 levels in triage 88% RA, 2L placed on pt. 95% 2L.  ? ?

## 2022-04-16 NOTE — ED Provider Notes (Signed)
?[facility] DEPT ?Provider Note ? ? ?CSN: [id number] ?Arrival date & time: 04/16/22  0915 ? ?  ? ?History ? ?Chief Complaint  ?Patient presents with  ? Shortness of Breath  ? ? ?Gregg [name] is a 66 y.o. male. ? ?Patient here with shortness of breath, chest pain, cough and congestion for the last several weeks.  Former smoker.  States that he has had some frequent respiratory infections/processes for the last several months.  States that he thinks he had COVID several years ago and that has led him to have more chronic respiratory issues.  He went to urgent care today and they gave breathing treatment and sent in for further evaluation because his oxygen was low.  Patient states that he feels short of breath when he walks but he has been able to tolerate that fairly well.  Denies any leg swelling, recent travel, blood clot history. ? ?The history is provided by the patient.  ?Shortness of Breath ? ?  ? ?Home Medications ?Prior to Admission medications   ?Medication Sig Start Date End Date Taking? Authorizing Provider  ?azithromycin (ZITHROMAX) 250 MG tablet Take 1 tablet (250 mg total) by mouth daily. Take first 2 tablets together, then 1 every day until finished. 04/16/22  Yes Edmond Ginsberg, DO  ?predniSONE (DELTASONE) 20 MG tablet Take 3 tablets (60 mg total) by mouth daily for 5 days. 04/16/22 04/21/22 Yes William Schake, DO  ?albuterol (VENTOLIN HFA) 108 (90 Base) MCG/ACT inhaler Inhale 2 puffs into the lungs every 6 (six) hours as needed for wheezing or shortness of breath. 04/02/22   Perlie Mayo, NP  ?atorvastatin (LIPITOR) 40 MG tablet Take 1 tablet (40 mg total) by mouth daily. 03/17/22 07/15/22  Camillia Herter, NP  ?benzonatate (TESSALON) 100 MG capsule Take 1 capsule (100 mg total) by mouth 2 (two) times daily as needed for cough. 04/02/22   Perlie Mayo, NP  ?fluticasone (FLONASE) 50 MCG/ACT nasal spray Place 2 sprays into both nostrils daily. 04/02/22   Perlie Mayo, NP  ?levocetirizine (XYZAL) 5 MG tablet Take 5 mg by mouth every evening.    [provider]  ?losartan-hydrochlorothiazide (HYZAAR) 50-12.5 MG tablet Take 1 tablet by mouth daily. 03/17/22 07/15/22  Camillia Herter, NP  ?methimazole (TAPAZOLE) 5 MG tablet TAKE 0.5 TABLETS (2.5 MG TOTAL) BY MOUTH 3 (THREE) TIMES A WEEK. ?Patient taking differently: Take 2.5 mg by mouth every other day. 12/24/21   Renato Shin, MD  ?Multiple Vitamins-Minerals [facility] COMPLETE PO) Take 1 tablet by mouth daily.    [provider]  ?MYRBETRIQ 50 MG TB24 tablet Take 50 mg by mouth daily. 02/25/22   [provider]  ?sertraline (ZOLOFT) 25 MG tablet Take 1 tablet (25 mg total) by mouth daily. 03/17/22 07/15/22  Camillia Herter, NP  ?   ? ?Allergies    ?Patient has no known allergies.   ? ?Review of Systems   ?Review of Systems  ?Respiratory:  Positive for shortness of breath.   ? ?Physical Exam ?Updated Vital Signs ?BP 120/85   Pulse 100   Temp 98.3 ?F (36.8 ?C) (Oral)   Resp 20   SpO2 90%  ?Physical Exam ?Vitals and nursing note reviewed.  ?Constitutional:   ?   General: He is not in acute distress. ?   Appearance: He is well-developed.  ?HENT:  ?   Head: Normocephalic and atraumatic.  ?   Mouth/Throat:  ?   Mouth: Mucous membranes  are moist.  ?Eyes:  ?   Conjunctiva/sclera: Conjunctivae normal.  ?   Pupils: Pupils are equal, round, and reactive to light.  ?Cardiovascular:  ?   Rate and Rhythm: Normal rate and regular rhythm.  ?   Pulses: Normal pulses.  ?   Heart sounds: Normal heart sounds. No murmur heard. ?Pulmonary:  ?   Effort: Pulmonary effort is normal. No respiratory distress.  ?   Breath sounds: Wheezing present.  ?Abdominal:  ?   Palpations: Abdomen is soft.  ?   Tenderness: There is no abdominal tenderness.  ?Musculoskeletal:     ?   General: No swelling.  ?   Cervical back: Normal range of motion and neck supple.  ?   Right lower leg: No edema.  ?   Left lower leg: No edema.  ?Skin: ?   General:  Skin is warm and dry.  ?   Capillary Refill: Capillary refill takes less than 2 seconds.  ?Neurological:  ?   Mental Status: He is alert.  ?Psychiatric:     ?   Mood and Affect: Mood normal.  ? ? ?ED Results / Procedures / Treatments   ?Labs ?(all labs ordered are listed, but only abnormal results are displayed) ?Labs Reviewed  ?CBC WITH DIFFERENTIAL/PLATELET - Abnormal; Notable for the following components:  ?    Result Value  ? Hemoglobin 17.2 (*)   ? All other components within normal limits  ?BASIC METABOLIC PANEL - Abnormal; Notable for the following components:  ? Glucose, Bld 109 (*)   ? All other components within normal limits  ?RESP PANEL BY RT-PCR (FLU A&B, COVID) ARPGX2  ?D-DIMER, QUANTITATIVE  ?BRAIN NATRIURETIC PEPTIDE  ?TROPONIN I (HIGH SENSITIVITY)  ? ? ?EKG ?EKG Interpretation ? ?Date/Time:  Thursday April 16 2022 09:26:43 EDT ?Ventricular Rate:  100 ?PR Interval:  147 ?QRS Duration: 120 ?QT Interval:  347 ?QTC Calculation: 448 ?R Axis:   83 ?Text Interpretation: Sinus tachycardia LAE, consider biatrial enlargement IVCD, consider atypical RBBB Confirmed by Ronnald Nian, Tuck Dulworth ([id number]) on 04/16/2022 9:36:29 AM ? ?Radiology ?DG Chest Portable 1 View ? ?Result Date: 04/16/2022 ?CLINICAL DATA:  Cough and congestion for several weeks. Treated for bronchitis. EXAM: PORTABLE CHEST 1 VIEW COMPARISON:  None. FINDINGS: The heart size and mediastinal contours are within normal limits. Both lungs are clear. The visualized skeletal structures are unremarkable. IMPRESSION: No active disease. Electronically Signed   By: Keane Police D.O.   On: 04/16/2022 09:43   ? ?Procedures ?Procedures  ? ? ?Medications Ordered in ED ?Medications  ?albuterol (VENTOLIN HFA) 108 (90 Base) MCG/ACT inhaler 6 puff (6 puffs Inhalation Given 04/16/22 1025)  ?predniSONE (DELTASONE) tablet 60 mg (60 mg Oral Given 04/16/22 1025)  ? ? ?ED Course/ Medical Decision Making/ A&P ?  ?                        ?Medical Decision Making ?Amount and/or  Complexity of Data Reviewed ?Labs: ordered. ?Radiology: ordered. ? ?Risk ?Prescription drug management. ? ? ?Gregg [name] is here with shortness of breath, cough.  History of acid reflux, hypertension, former smoker.  Patient arrives with overall normal vitals.  Room air oxygenation is 90% and above.  When he ambulates in the room pulse ox stays fairly steady above 90%.  He does not look like he is having any signs of respiratory distress.  He does have some wheezing on exam but does have fairly good air movement.  No  history of asthma or COPD.  He states he has had some chronic lung issues since having COVID several years back.  He has had respiratory symptoms now the last several weeks.  Denies any active chest pain.  Denies any leg swelling.  Does not have any obvious blood clot risk factors.  EKG done in triage shows sinus rhythm.  No obvious ischemic changes.  Unchanged from prior EKG.  Overall differential diagnosis is likely reactive airway process/bronchitis but could be pneumonia and less likely acute coronary syndrome, PE, heart failure.  Since patient does not have definitive diagnosis of asthma or COPD will do further work-up given the length of his symptoms and will check CBC, BMP, troponin, BNP, D-dimer, COVID testing, chest x-ray. ? ?Per my review and interpretation of chest x-ray there is no significant pneumonia or pneumothorax.  D-dimer normal, troponin normal.  No significant anemia, electrolyte abnormality, kidney injury otherwise.  COVID and test are negative.  He is fairly minimally symptomatic.  Room air oxygenation has been stable both at rest and with ambulation while here.  Feeling better after breathing treatment.  Will treat for reactive airway/bronchitis process.  Prescribe Z-Pak and prednisone.  We will refer him to pulmonology and discharged in ED in good condition. ? ?This chart was dictated using voice recognition software.  Despite best efforts to proofread,  errors can  occur which can change the documentation meaning.  ? ? ? ? ? ? ? ?Final Clinical Impression(s) / ED Diagnoses ?Final diagnoses:  ?Bronchitis  ? ? ?Rx / DC Orders ?ED Discharge Orders   ? ?      Ordered  ?  azithromycin (

## 2022-04-16 NOTE — ED Provider Notes (Signed)
?[facility] ? ? ? ?CSN: [id number] ?Arrival date & time: 04/16/22  1517 ? ? ?  ? ?History   ?Chief Complaint ?Chief Complaint  ?Patient presents with  ? Follow-up  ?  Chest congestion - Entered by patient  ? Appointment  ? ? ?HPI ?Gregg [name] is a 66 y.o. male.  ? ?Patient presents with chest congestion, cough, nasal congestion that has been present for several weeks.  Patient has been seen multiple times by E-visit by PCP for symptoms.  He was seen on 02/08/2022 and was diagnosed with viral upper respiratory infection.  He was seen on 02/17/2022 and diagnosed with acute bacterial sinusitis and was prescribed Augmentin.  He then returned on 02/20/2022 and prescribed prednisone for suspected bronchitis.  Patient returned on 04/02/2022 and was treated for viral upper respiratory infection with several different medications including albuterol inhaler and Flonase.  Patient reports that he has not taken the albuterol inhaler.  He then had another visit on 04/06/2022 and was advised to seek care in person given persistent symptoms.  Patient reports intermittent shortness of breath and persistent productive cough.  Denies any known fevers at home.  Denies history of asthma or COPD but patient reports that he quit smoking approximately 5 years ago. ? ? ? ?Past Medical History:  ?Diagnosis Date  ? Anxiety   ? Bilateral inguinal hernia 06/02/2021  ? GERD (gastroesophageal reflux disease)   ? History of kidney stones 2020  ? Hypertension   ? Sinus complaint 06/02/2021  ? x 1 week per pt  ? Wears glasses 06/02/2021  ? ? ?Patient Active Problem List  ? Diagnosis Date Noted  ? Hyperthyroidism 01/13/2022  ? Symptomatic cholelithiasis 01/08/2022  ? S/P laparoscopic cholecystectomy 01/08/2022  ? Adenomatous polyp of transverse colon   ? Diverticulosis of colon without hemorrhage   ? Grade II internal hemorrhoids   ? Multinodular goiter 12/01/2021  ? Hyperlipidemia 09/11/2021  ? Essential hypertension 07/22/2021  ?  Anxiety and depression 07/02/2021  ? History of appendicitis 07/01/2021  ? Acute phlegmonous appendicitis s/p lap appendectomy 03/07/2021 03/07/2021  ? Kidney stone 03/07/2021  ? Bilateral inguinal hernia (BIH) s/p lap repair with mesh 06/06/2021 03/07/2021  ? ? ?Past Surgical History:  ?Procedure Laterality Date  ? COLONOSCOPY WITH PROPOFOL N/A 12/31/2021  ? Procedure: COLONOSCOPY WITH PROPOFOL;  Surgeon: Lavena Bullion, DO;  Location: [facility];  Service: Gastroenterology;  Laterality: N/A;  ? ENDOSCOPIC MUCOSAL RESECTION N/A 12/31/2021  ? Procedure: ENDOSCOPIC MUCOSAL RESECTION;  Surgeon: Lavena Bullion, DO;  Location: [facility];  Service: Gastroenterology;  Laterality: N/A;  ? EXTRACORPOREAL SHOCK WAVE LITHOTRIPSY Left 02/23/2019  ? Procedure: EXTRACORPOREAL SHOCK WAVE LITHOTRIPSY (ESWL);  Surgeon: Bjorn Loser, MD;  Location: [facility];  Service: Urology;  Laterality: Left;  ? HEMOSTASIS CLIP PLACEMENT  12/31/2021  ? Procedure: HEMOSTASIS CLIP PLACEMENT;  Surgeon: Lavena Bullion, DO;  Location[facility];  Service: Gastroenterology;;  ? INGUINAL HERNIA REPAIR Bilateral 06/06/2021  ? Procedure: LAPAROSCOPIC BILATERAL INGUINAL HERNIA REPAIRS WITH MESH;  Surgeon: Michael Boston, MD;  Location: [facility];  Service: General;  Laterality: Bilateral;  TAP BLOCK  ? LAPAROSCOPIC APPENDECTOMY N/A 03/07/2021  ? Procedure: APPENDECTOMY LAPAROSCOPIC;  Surgeon: Michael Boston, MD;  [facility];  Service: General;  Laterality: N/A;  ? SUBMUCOSAL LIFTING INJECTION  12/31/2021  ? Procedure: SUBMUCOSAL LIFTING INJECTION;  Surgeon: Lavena Bullion, DO;  [facility];  Service: Gastroenterology;;  ? ? ? ? ? ?Home Medications   ? ?  Prior to Admission medications   ?Medication Sig Start Date End Date Taking? Authorizing Provider  ?albuterol (VENTOLIN HFA) 108 (90 Base) MCG/ACT inhaler Inhale 2 puffs into the lungs every 6 (six) hours as needed for wheezing or shortness of breath.  04/02/22  Yes Perlie Mayo, NP  ?atorvastatin (LIPITOR) 40 MG tablet Take 1 tablet (40 mg total) by mouth daily. 03/17/22 07/15/22 Yes Minette Brine, Amy J, NP  ?fluticasone (FLONASE) 50 MCG/ACT nasal spray Place 2 sprays into both nostrils daily. 04/02/22  Yes Perlie Mayo, NP  ?levocetirizine (XYZAL) 5 MG tablet Take 5 mg by mouth every evening.   Yes [provider]  ?losartan-hydrochlorothiazide (HYZAAR) 50-12.5 MG tablet Take 1 tablet by mouth daily. 03/17/22 07/15/22 Yes Minette Brine, Amy J, NP  ?methimazole (TAPAZOLE) 5 MG tablet TAKE 0.5 TABLETS (2.5 MG TOTAL) BY MOUTH 3 (THREE) TIMES A WEEK. ?Patient taking differently: Take 2.5 mg by mouth every other day. 12/24/21  Yes Renato Shin, MD  ?Multiple Vitamins-Minerals [facility] COMPLETE PO) Take 1 tablet by mouth daily.   Yes [provider]  ?MYRBETRIQ 50 MG TB24 tablet Take 50 mg by mouth daily. 02/25/22  Yes [provider]  ?sertraline (ZOLOFT) 25 MG tablet Take 1 tablet (25 mg total) by mouth daily. 03/17/22 07/15/22 Yes Minette Brine, Amy J, NP  ?benzonatate (TESSALON) 100 MG capsule Take 1 capsule (100 mg total) by mouth 2 (two) times daily as needed for cough. 04/02/22   Perlie Mayo, NP  ? ? ?Family History ?Family History  ?Problem Relation Age of Onset  ? Thyroid disease Mother   ? Thyroid disease Father   ? Thyroid disease Sister   ? Stomach cancer Neg Hx   ? Colon cancer Neg Hx   ? Esophageal cancer Neg Hx   ? Rectal cancer Neg Hx   ? ? ?Social History ?Social History  ? ?Tobacco Use  ? Smoking status: Former  ?  Packs/day: 0.50  ?  Years: 40.00  ?  Pack years: 20.00  ?  Types: Cigarettes  ?  Quit date: 03/29/2017  ?  Years since quitting: 5.0  ? Smokeless tobacco: Never  ?Vaping Use  ? Vaping Use: Never used  ?Substance Use Topics  ? Alcohol use: Yes  ?  Comment: rare  ? Drug use: Never  ? ? ? ?Allergies   ?Patient has no known allergies. ? ? ?Review of Systems ?Review of Systems ?Per HPI ? ?Physical Exam ?Triage Vital Signs ?ED Triage  Vitals  ?Enc Vitals Group  ?   BP 04/16/22 0827 (!) 149/72  ?   Pulse Rate 04/16/22 0827 (!) 103  ?   Resp 04/16/22 0827 (!) 22  ?   Temp 04/16/22 0827 98 ?F (36.7 ?C)  ?   Temp Source 04/16/22 0827 Oral  ?   SpO2 04/16/22 0827 93 %  ?   Weight 04/16/22 0828 171 lb (77.6 kg)  ?   Height 04/16/22 0828 '5\' 9"'$  (1.753 m)  ?   Head Circumference --   ?   Peak Flow --   ?   Pain Score 04/16/22 0828 0  ?   Pain Loc --   ?   Pain Edu? --   ?   Excl. in [place]? --   ? ?No data found. ? ?Updated Vital Signs ?BP (!) 149/72 (BP Location: Left Arm)   Pulse (!) 103   Temp 98 ?F (36.7 ?C) (Oral)   Resp (!) 22   Ht '5\' 9"'$  (  1.753 m)   Wt 171 lb (77.6 kg)   SpO2 93%   BMI 25.25 kg/m?  ? ?Visual Acuity ?Right Eye Distance:   ?Left Eye Distance:   ?Bilateral Distance:   ? ?Right Eye Near:   ?Left Eye Near:    ?Bilateral Near:    ? ?Physical Exam ?Constitutional:   ?   General: He is not in acute distress. ?   Appearance: Normal appearance. He is not toxic-appearing or diaphoretic.  ?HENT:  ?   Head: Normocephalic and atraumatic.  ?   Right Ear: Tympanic membrane and ear canal normal.  ?   Left Ear: Tympanic membrane and ear canal normal.  ?   Nose: Congestion present.  ?   Mouth/Throat:  ?   Mouth: Mucous membranes are moist.  ?   Pharynx: No posterior oropharyngeal erythema.  ?Eyes:  ?   Extraocular Movements: Extraocular movements intact.  ?   Conjunctiva/sclera: Conjunctivae normal.  ?   Pupils: Pupils are equal, round, and reactive to light.  ?Cardiovascular:  ?   Rate and Rhythm: Normal rate and regular rhythm.  ?   Pulses: Normal pulses.  ?   Heart sounds: Normal heart sounds.  ?Pulmonary:  ?   Effort: Pulmonary effort is normal. No respiratory distress.  ?   Breath sounds: No stridor. Wheezing present. No rhonchi or rales.  ?   Comments: Expiratory wheezes on exam, very mild increased work of breathing noted.  ?Abdominal:  ?   General: Abdomen is flat. Bowel sounds are normal.  ?   Palpations: Abdomen is soft.   ?Musculoskeletal:     ?   General: Normal range of motion.  ?   Cervical back: Normal range of motion.  ?Skin: ?   General: Skin is warm and dry.  ?Neurological:  ?   General: No focal deficit present.  ?   Mental Status: He is alert

## 2022-04-16 NOTE — ED Notes (Signed)
Patient is being discharged from the Urgent Care and sent to the Emergency Department via private vehicle . Per [place] patient is in need of higher level of care due to further evaluation. Patient is aware and verbalizes understanding of plan of care.  ?Vitals:  ? 04/16/22 0827  ?BP: (!) 149/72  ?Pulse: (!) 103  ?Resp: (!) 22  ?Temp: 98 ?F (36.7 ?C)  ?SpO2: 93%  ?  ?

## 2022-04-20 DIAGNOSIS — R002 Palpitations: Secondary | ICD-10-CM | POA: Diagnosis not present

## 2022-04-29 ENCOUNTER — Encounter: Payer: Self-pay | Admitting: Family

## 2022-05-05 ENCOUNTER — Telehealth: Payer: Self-pay | Admitting: Pulmonary Disease

## 2022-05-05 ENCOUNTER — Ambulatory Visit (INDEPENDENT_AMBULATORY_CARE_PROVIDER_SITE_OTHER): Payer: BC Managed Care – PPO | Admitting: Pulmonary Disease

## 2022-05-05 ENCOUNTER — Other Ambulatory Visit: Payer: Self-pay | Admitting: Pulmonary Disease

## 2022-05-05 ENCOUNTER — Encounter: Payer: Self-pay | Admitting: Pulmonary Disease

## 2022-05-05 VITALS — BP 124/76 | HR 82 | Temp 98.0°F | Ht 69.0 in | Wt 172.0 lb

## 2022-05-05 DIAGNOSIS — J069 Acute upper respiratory infection, unspecified: Secondary | ICD-10-CM | POA: Diagnosis not present

## 2022-05-05 DIAGNOSIS — R0609 Other forms of dyspnea: Secondary | ICD-10-CM

## 2022-05-05 MED ORDER — ALBUTEROL SULFATE HFA 108 (90 BASE) MCG/ACT IN AERS: 2.0000 | INHALATION_SPRAY | Freq: Four times a day (QID) | RESPIRATORY_TRACT | 1 refills | Status: DC | PRN

## 2022-05-05 MED ORDER — FLUTICASONE FUROATE-VILANTEROL 100-25 MCG/ACT IN AEPB: 1.0000 | INHALATION_SPRAY | Freq: Every day | RESPIRATORY_TRACT | 0 refills | Status: DC

## 2022-05-05 NOTE — Addendum Note (Signed)
Addended byDessie Coma on: 05/05/2022 10:59 AM ? ? Modules accepted: Orders ? ?

## 2022-05-05 NOTE — Progress Notes (Signed)
? ?      ?Gregg [name]    [id number]    October 29, 1956 ? ?Primary Care Physician:Stephens, Amy J, NP ? ?Referring Physician: Camillia Herter, NP ?[address] ?[address] ?[place],  [place] [address] ? ?Chief complaint:   ?Patient being seen for shortness of breath ?Recurrent bronchitis ? ?HPI: ? ?Was recently seen in the emergency department for shortness of breath, diagnosed with bronchitis ?He was noted to have a saturation of 88 when he was evaluated ? ?Was given albuterol which seems to have helped his symptoms ? ?He was able to mow his lawn a couple of days ago and the following day noted significant wheezing ?The albuterol does help on days like this ? ?He was prescribed Xyzal at some point which he usually uses for allergies ?Has not been on it regularly recently ? ?Reformed smoker, quit smoking in 2018 ? ?Did not have significant lung disease growing up ? ?Works as a Customer service manager ? ?No pets ? ?Does not feel limited with activities of daily living ? ?He does have a history of hypertension, hypercholesterolemia, gallbladder surgery recently ? ?Outpatient Encounter Medications as of 05/05/2022  ?Medication Sig  ? albuterol (VENTOLIN HFA) 108 (90 Base) MCG/ACT inhaler Inhale 2 puffs into the lungs every 6 (six) hours as needed for wheezing or shortness of breath.  ? atorvastatin (LIPITOR) 40 MG tablet Take 1 tablet (40 mg total) by mouth daily.  ? fluticasone (FLONASE) 50 MCG/ACT nasal spray Place 2 sprays into both nostrils daily.  ? losartan-hydrochlorothiazide (HYZAAR) 50-12.5 MG tablet Take 1 tablet by mouth daily.  ? methimazole (TAPAZOLE) 5 MG tablet TAKE 0.5 TABLETS (2.5 MG TOTAL) BY MOUTH 3 (THREE) TIMES A WEEK. (Patient taking differently: Take 2.5 mg by mouth every other day.)  ? MYRBETRIQ 50 MG TB24 tablet Take 50 mg by mouth daily.  ? sertraline (ZOLOFT) 25 MG tablet Take 1 tablet (25 mg total) by mouth daily.  ? [DISCONTINUED] azithromycin (ZITHROMAX) 250 MG tablet Take 1 tablet (250 mg total) by  mouth daily. Take first 2 tablets together, then 1 every day until finished.  ? [DISCONTINUED] benzonatate (TESSALON) 100 MG capsule Take 1 capsule (100 mg total) by mouth 2 (two) times daily as needed for cough.  ? [DISCONTINUED] levocetirizine (XYZAL) 5 MG tablet Take 5 mg by mouth every evening.  ? [DISCONTINUED] Multiple Vitamins-Minerals (MULTI COMPLETE PO) Take 1 tablet by mouth daily.  ? ?No facility-administered encounter medications on file as of 05/05/2022.  ? ? ?Allergies as of 05/05/2022  ? (No Known Allergies)  ? ? ?Past Medical History:  ?Diagnosis Date  ? Anxiety   ? Bilateral inguinal hernia 06/02/2021  ? GERD (gastroesophageal reflux disease)   ? History of kidney stones 2020  ? Hypertension   ? Sinus complaint 06/02/2021  ? x 1 week per pt  ? Wears glasses 06/02/2021  ? ? ?Past Surgical History:  ?Procedure Laterality Date  ? COLONOSCOPY WITH PROPOFOL N/A 12/31/2021  ? Procedure: COLONOSCOPY WITH PROPOFOL;  Surgeon: Lavena Bullion, DO;  [facility];  Service: Gastroenterology;  Laterality: N/A;  ? ENDOSCOPIC MUCOSAL RESECTION N/A 12/31/2021  ? Procedure: ENDOSCOPIC MUCOSAL RESECTION;  Surgeon: Lavena Bullion, DO;  [facility];  Service: Gastroenterology;  Laterality: N/A;  ? EXTRACORPOREAL SHOCK WAVE LITHOTRIPSY Left 02/23/2019  ? Procedure: EXTRACORPOREAL SHOCK WAVE LITHOTRIPSY (ESWL);  Surgeon: Bjorn Loser, MD;  [facility];  Service: Urology;  Laterality: Left;  ? HEMOSTASIS CLIP PLACEMENT  12/31/2021  ?  Procedure: HEMOSTASIS CLIP PLACEMENT;  Surgeon: Lavena Bullion, DO;  [facility];  Service: Gastroenterology;;  ? INGUINAL HERNIA REPAIR Bilateral 06/06/2021  ? Procedure: LAPAROSCOPIC BILATERAL INGUINAL HERNIA REPAIRS WITH MESH;  Surgeon: Michael Boston, MD;  Location: [facility];  Service: General;  Laterality: Bilateral;  TAP BLOCK  ? LAPAROSCOPIC APPENDECTOMY N/A 03/07/2021  ? Procedure: APPENDECTOMY LAPAROSCOPIC;  Surgeon:  Michael Boston, MD;  [facility];  Service: General;  Laterality: N/A;  ? SUBMUCOSAL LIFTING INJECTION  12/31/2021  ? Procedure: SUBMUCOSAL LIFTING INJECTION;  Surgeon: Lavena Bullion, DO;  [facility];  Service: Gastroenterology;;  ? ? ?Family History  ?Problem Relation Age of Onset  ? Thyroid disease Mother   ? Thyroid disease Father   ? Thyroid disease Sister   ? Stomach cancer Neg Hx   ? Colon cancer Neg Hx   ? Esophageal cancer Neg Hx   ? Rectal cancer Neg Hx   ? ? ?Social History  ? ?Socioeconomic History  ? Marital status: Single  ?  Spouse name: Not on file  ? Number of children: Not on file  ? Years of education: Not on file  ? Highest education level: Not on file  ?Occupational History  ? Not on file  ?Tobacco Use  ? Smoking status: Former  ?  Packs/day: 0.50  ?  Years: 40.00  ?  Pack years: 20.00  ?  Types: Cigarettes  ?  Quit date: 03/29/2017  ?  Years since quitting: 5.1  ? Smokeless tobacco: Never  ?Vaping Use  ? Vaping Use: Never used  ?Substance and Sexual Activity  ? Alcohol use: Yes  ?  Comment: rare  ? Drug use: Never  ? Sexual activity: Not on file  ?Other Topics Concern  ? Not on file  ?Social History Narrative  ? Not on file  ? ?Social Determinants of Health  ? ?Financial Resource Strain: Not on file  ?Food Insecurity: Not on file  ?Transportation Needs: Not on file  ?Physical Activity: Not on file  ?Stress: Not on file  ?Social Connections: Not on file  ?Intimate Partner Violence: Not on file  ? ? ?Review of Systems  ?Constitutional:  Negative for fatigue.  ?Respiratory:  Positive for shortness of breath.   ? ?Vitals:  ? 05/05/22 1002  ?BP: 124/76  ?Pulse: 82  ?Temp: 98 ?F (36.7 ?C)  ?SpO2: 98%  ? ? ? ?Physical Exam ?Constitutional:   ?   Appearance: Normal appearance.  ?HENT:  ?   Head: Normocephalic.  ?   Nose: No congestion.  ?   Mouth/Throat:  ?   Mouth: Mucous membranes are moist.  ?Cardiovascular:  ?   Rate and Rhythm: Normal rate and regular rhythm.  ?   Heart sounds: No  murmur heard. ?  No friction rub.  ?Pulmonary:  ?   Effort: No respiratory distress.  ?   Breath sounds: No stridor. No wheezing or rhonchi.  ?Musculoskeletal:  ?   Cervical back: No rigidity or tenderness.  ?Neurological:  ?   Mental Status: He is alert.  ?Psychiatric:     ?   Mood and Affect: Mood normal.  ? ? ? ?Data Reviewed: ?No previous studies ? ?Most recent chest x-ray shows no acute infiltrate ? ?Recent abdominal CT shows no significant infiltrate at the bases of the lungs with no significant emphysematous changes at the base of the lungs ? ?Assessment:  ?Recurrent bronchitis ? ?Wheezing ? ?Shortness of breath on exertion ? ?  Possible significant obstructive lung disease with past history of smoking ? ? ? ?Plan/Recommendations: ?Schedule patient for pulmonary function test ? ?Prescription for [facility] sent into pharmacy ? ?Encouraged to use Breo daily, encouraged to rinse his mouth daily ? ?Inhaler technique does look good today ? ?Tentative follow-up in 2 to 3 months ? ? ?Sherrilyn Rist MD ?[facility] ?05/05/2022, 10:27 AM ? ?CC: Camillia Herter, NP ? ? ?

## 2022-05-05 NOTE — Patient Instructions (Signed)
I will see you back in 2 to 3 months ? ?Schedule for pulmonary function test ? ?Continue with rescue inhaler as needed ? ?Prescription for [facility] sent into pharmacy for you for 90-day supply ? ?Call us with significant concerns ?

## 2022-05-06 NOTE — Telephone Encounter (Signed)
Called patient and he states that BREO was denied by insurance. Can we possibly run a test claim to see what is covered for this patient in regards to this inhaler.  ? ?Thank you  ?

## 2022-05-07 ENCOUNTER — Other Ambulatory Visit (HOSPITAL_COMMUNITY): Payer: Self-pay

## 2022-05-07 NOTE — Telephone Encounter (Signed)
Test billing results for [employer] are as follows: Advair HFA: $0 Advair Diskus: $0 Symbicort: $0 Dulera: $0 Breo: $0

## 2022-05-08 NOTE — Telephone Encounter (Signed)
Called patient and let him know that he should be able to get his Breo inhaler with no issues now since the claim was ran for his inhaler. Patient states that he got a call yesterday and he picked up his inhaler yesterday. Patient had no questions or concerns. Nothing further needed

## 2022-06-03 ENCOUNTER — Encounter: Payer: Self-pay | Admitting: Gastroenterology

## 2022-06-03 ENCOUNTER — Ambulatory Visit (INDEPENDENT_AMBULATORY_CARE_PROVIDER_SITE_OTHER): Payer: BC Managed Care – PPO | Admitting: Gastroenterology

## 2022-06-03 VITALS — BP 110/76 | HR 94 | Ht 69.0 in | Wt 178.2 lb

## 2022-06-03 DIAGNOSIS — K641 Second degree hemorrhoids: Secondary | ICD-10-CM

## 2022-06-03 DIAGNOSIS — R9389 Abnormal findings on diagnostic imaging of other specified body structures: Secondary | ICD-10-CM | POA: Diagnosis not present

## 2022-06-03 DIAGNOSIS — K921 Melena: Secondary | ICD-10-CM

## 2022-06-03 NOTE — Progress Notes (Signed)
Chief Complaint:    Symptomatic hemorrhoids, hematochezia  GI History: 66 year old male with index screening colonoscopy in 09/2021 after positive Cologuard which was notable for large polyp as outlined below.  Otherwise was without GI symptoms and no known family history of colon cancer or related malignancies.  - 08/2021: Cologuard positive.  Normal CBC and liver enzymes - 10/17/2021: Colonoscopy: 3 polyps ranging 5-10 mm (path: Tubular adenoma, SSP), 30 mm polyp in proximal transverse colon biopsied and tattooed (path: Tubular adenoma), 8 mm flat transverse polyp (path: SSP), 2 flat polyps ranging 8-12 mm in descending colon (path: HP), 2 subcentimeter sigmoid/descending polyps (path: TA x1, HP x1), 2 small rectal polyps (path: HP).  Sigmoid diverticulosis, internal hemorrhoids.  Normal TI -10/20/122: CEA Normal - 12/31/2021: Colonoscopy: 30 mm transverse colon adenoma removed via EMR en bloc then clipped closed.  Sigmoid diverticulosis.  Internal hemorrhoids.  Repeat in 1 year - 01/08/2022: CT abdomen/pelvis (indication: Acute abdominal pain): Normal liver, cholelithiasis without cholecystitis or duct dilation.  Mild circumferential wall thickening of distal esophagus above the GE junction with adjacent lymph node in the posterior mediastinum measuring 0.6 cm.  Postsurgical changes c/w prior appendectomy - 01/08/2022: RUQ [place]: Cholelithiasis with upper normal GB wall thickness and minimal edema.  No duct dilation.  CBD 6 mm - 01/08/2022: Robotic assisted cholecystectomy     Endoscopic History: - Colonoscopy (10/17/2021): 3 polyps ranging 5-10 mm (path: Tubular adenoma, SSP), 30 mm polyp in proximal transverse colon biopsied and tattooed (path: Tubular adenoma), 8 mm flat transverse polyp (path: SSP), 2 flat polyps ranging 8-12 mm in descending colon (path: HP), 2 subcentimeter sigmoid/descending polyps (path: TA x1, HP x1), 2 small rectal polyps (path: HP).  Sigmoid diverticulosis, internal  hemorrhoids.  Normal TI - Colonoscopy (12/31/2021): 30 mm transverse colon adenoma removed via EMR en bloc then clipped closed.  Sigmoid diverticulosis.  Internal hemorrhoids.  Repeat in 1 year  HPI:     Patient is a 66 y.o. male presenting to the Gastroenterology Clinic for evaluation of symptomatic hemorrhoids.  Intermittent hemorrhoidal sxs with intermittent BRBPR and discomfort. Palpable hemorroid. Worse with any stress, even with kneeling/squatting with gardening. Has been using fiber supplement. No straining with BM. Last episode was 3 days ago. Improves with prn Preparation H. Has had hemorrhoids for many years.    Was seen in the pulmonary clinic on 05/05/2022 for evaluation of recurrent bronchitis.  Prescribed Breo with significant improvement and sent for PFTs.      Latest Ref Rng & Units 04/16/2022   10:20 AM 01/08/2022    6:16 PM 01/08/2022    2:14 AM  CBC  WBC 4.0 - 10.5 K/uL 7.4  16.7  10.2   Hemoglobin 13.0 - 17.0 g/dL 17.2  14.6  16.2   Hematocrit 39.0 - 52.0 % 49.8  43.8  46.2   Platelets 150 - 400 K/uL 242  233  273      Review of systems:     No chest pain, no SOB, no fevers, no urinary sx   Past Medical History:  Diagnosis Date   Anxiety    Bilateral inguinal hernia 06/02/2021   GERD (gastroesophageal reflux disease)    History of kidney stones 2020   Hypertension    Sinus complaint 06/02/2021   x 1 week per pt   Wears glasses 06/02/2021    Patient's surgical history, family medical history, social history, medications and allergies were all reviewed in Epic    Current Outpatient Medications  Medication  Sig Dispense Refill   albuterol (VENTOLIN HFA) 108 (90 Base) MCG/ACT inhaler Inhale 2 puffs into the lungs every 6 (six) hours as needed for wheezing or shortness of breath. 8 g 1   atorvastatin (LIPITOR) 40 MG tablet Take 1 tablet (40 mg total) by mouth daily. 120 tablet 0   BREO ELLIPTA 100-25 MCG/ACT AEPB Inhale 1 puff into the lungs daily. 60 each 5    fluticasone (FLONASE) 50 MCG/ACT nasal spray Place 2 sprays into both nostrils daily. 16 g 0   losartan-hydrochlorothiazide (HYZAAR) 50-12.5 MG tablet Take 1 tablet by mouth daily. 120 tablet 0   methimazole (TAPAZOLE) 5 MG tablet TAKE 0.5 TABLETS (2.5 MG TOTAL) BY MOUTH 3 (THREE) TIMES A WEEK. (Patient taking differently: Take 2.5 mg by mouth every other day.) 20 tablet 1   MYRBETRIQ 50 MG TB24 tablet Take 50 mg by mouth daily.     sertraline (ZOLOFT) 25 MG tablet Take 1 tablet (25 mg total) by mouth daily. 120 tablet 0   No current facility-administered medications for this visit.    Physical Exam:     BP 110/76   Pulse 94   Ht '5\' 9"'$  (1.753 m)   Wt 178 lb 4 oz (80.9 kg)   BMI 26.32 kg/m   GENERAL:  Pleasant male in NAD PSYCH: : Cooperative, normal affect NEURO: Alert and oriented x 3, no focal neurologic deficits Rectal exam: Sensation intact and preserved anal wink.  Small external skin tags.  No external anal fissures. Normal sphincter tone.  Anoscopy with grade 2 internal hemorrhoids in all positions.  (Chaperone: Curlene Labrum, CMA).     IMPRESSION and PLAN:    1) Symptomatic Internal hemorrhoids 2) Hematochezia 3) Rectal discomfort  PROCEDURE NOTE: The patient presents with symptomatic grade 2 hemorrhoids, unresponsive to maximal medical therapy, requesting rubber band ligation of symptomatic hemorrhoidal disease.  All risks, benefits and alternative forms of therapy were described and informed consent was obtained.  In the Left Lateral Decubitus position, anoscopic examination revealed grade 2 hemorrhoids in the all position(s).  The anorectum was pre-medicated with RectiCare. The decision was made to band the LL internal hemorrhoid, and the Crawford was used to perform band ligation without complication.  Digital anorectal examination was then performed to assure proper positioning of the band, and to adjust the banded tissue as required.  The patient was  discharged home without pain or other issues.  Dietary and behavioral recommendations were given and along with follow-up instructions.     The following adjunctive treatments were recommended:  -Resume high-fiber diet with fiber supplement (i.e. Citrucel or Benefiber) with goal for soft stools without straining to have a BM. -Resume adequate fluid intake.  The patient will return in 4 for follow-up and possible additional banding as required. No complications were encountered and the patient tolerated the procedure well.  4) Abnormal imaging study CT in January with incidentally noted mild circumferential wall thickening of the distal esophagus with adjacent 0.6 cm lymph node.  Discussed results today.  Possibly related to his recurrent bronchitis he was dealing with at the time which has since significantly improved since starting Breo.  Ultimately recommended EGD to evaluate esophagus. - EGD to be scheduled in July due to his schedule - If EGD unremarkable, can consider repeat CT chest for evaluation of the 0.6 cm posterior mediastinal lymph node vs follow-up with his Pulmonologist  I spent 20 minutes of nonprocedural time, including independent review of results as outlined above,  communicating results with the patient directly, face-to-face time with the patient, coordinating care, ordering studies and medications as appropriate, and documentation.    The indications, risks, and benefits of EGD were explained to the patient in detail. Risks include but are not limited to bleeding, perforation, adverse reaction to medications, and cardiopulmonary compromise. Sequelae include but are not limited to the possibility of surgery, hospitalization, and mortality. The patient verbalized understanding and wished to proceed. All questions answered, referred to scheduler. Further recommendations pending results of the exam.     Lavena Bullion ,DO, FACG 06/03/2022, 2:26 PM

## 2022-06-03 NOTE — Patient Instructions (Addendum)
You have been scheduled for an appointment with Dr. Bryan Lemma on 07/08/22 at 1:40 pm . Please arrive 10 minutes early for your appointment.   You have been scheduled for an endoscopy. Please follow written instructions given to you at your visit today. If you use inhalers (even only as needed), please bring them with you on the day of your procedure.   If you are age 64 or younger, your body mass index should be between 19-25. Your Body mass index is 26.32 kg/m. If this is out of the aformentioned range listed, please consider follow up with your Primary Care Provider.   ________________________________________________________  The [facility] providers would like to encourage you to use [facility] to communicate with providers for non-urgent requests or questions.  Due to long hold times on the telephone, sending your provider a message by [facility] may be a faster and more efficient way to get a response.  Please allow 48 business hours for a response.  Please remember that this is for non-urgent requests.  _______________________________________________________  Due to recent changes in healthcare laws, you may see the results of your imaging and laboratory studies on MyChart before your provider has had a chance to review them.  We understand that in some cases there may be results that are confusing or concerning to you. Not all laboratory results come back in the same time frame and the provider may be waiting for multiple results in order to interpret others.  Please give [place] 48 hours in order for your provider to thoroughly review all the results before contacting the office for clarification of your results.    HEMORRHOID BANDING PROCEDURE    FOLLOW-UP CARE   The procedure you have had should have been relatively painless since the banding of the area involved does not have nerve endings and there is no pain sensation.  The rubber band cuts off the blood supply to the hemorrhoid and the band  may fall off as soon as 48 hours after the banding (the band may occasionally be seen in the toilet bowl following a bowel movement). You may notice a temporary feeling of fullness in the rectum which should respond adequately to plain Tylenol or Motrin.  Following the banding, avoid strenuous exercise that evening and resume full activity the next day.  A sitz bath (soaking in a warm tub) or bidet is soothing, and can be useful for cleansing the area after bowel movements.     To avoid constipation, take two tablespoons of natural wheat bran, natural oat bran, flax, Benefiber or any over the counter fiber supplement and increase your water intake to 7-8 glasses daily.    Unless you have been prescribed anorectal medication, do not put anything inside your rectum for two weeks: No suppositories, enemas, fingers, etc.  Occasionally, you may have more bleeding than usual after the banding procedure.  This is often from the untreated hemorrhoids rather than the treated one.  Don't be concerned if there is a tablespoon or so of blood.  If there is more blood than this, lie flat with your bottom higher than your head and apply an ice pack to the area. If the bleeding does not stop within a half an hour or if you feel faint, call our office at [phone] or go to the emergency room.  Problems are not common; however, if there is a substantial amount of bleeding, severe pain, chills, fever or difficulty passing urine (very rare) or other problems,  you should call us at [phone] or report to the nearest emergency room.  Do not stay seated continuously for more than 2-3 hours for a day or two after the procedure.  Tighten your buttock muscles 10-15 times every two hours and take 10-15 deep breaths every 1-2 hours.  Do not spend more than a few minutes on the toilet if you cannot empty your bowel; instead re-visit the toilet at a later time.     Thank you for choosing me and [facility].  Vito Cirigliano, D.O.

## 2022-06-09 ENCOUNTER — Encounter: Payer: Self-pay | Admitting: Family

## 2022-06-09 ENCOUNTER — Other Ambulatory Visit: Payer: Self-pay | Admitting: Family

## 2022-06-09 ENCOUNTER — Other Ambulatory Visit: Payer: Self-pay

## 2022-06-09 DIAGNOSIS — F419 Anxiety disorder, unspecified: Secondary | ICD-10-CM

## 2022-06-09 DIAGNOSIS — I10 Essential (primary) hypertension: Secondary | ICD-10-CM

## 2022-06-09 MED ORDER — LOSARTAN POTASSIUM-HCTZ 50-12.5 MG PO TABS: 1.0000 | ORAL_TABLET | Freq: Every day | ORAL | 0 refills | Status: DC

## 2022-06-09 MED ORDER — SERTRALINE HCL 25 MG PO TABS: 25.0000 mg | ORAL_TABLET | Freq: Every day | ORAL | 0 refills | Status: DC

## 2022-06-09 NOTE — Telephone Encounter (Signed)
Last RF both requested meds last ordered on  03/17/22 #120 no RF Requested Prescriptions  Refused Prescriptions Disp Refills  . losartan-hydrochlorothiazide (HYZAAR) 50-12.5 MG tablet [Pharmacy Med Name: LOSARTAN-HCTZ 50-12.5 MG TAB] 90 tablet 1    Sig: TAKE 1 TABLET BY MOUTH EVERY DAY     Cardiovascular: ARB + Diuretic Combos Passed - 06/09/2022  9:31 AM      Passed - K in normal range and within 180 days    Potassium  Date Value Ref Range Status  04/16/2022 4.0 3.5 - 5.1 mmol/L Final         Passed - Na in normal range and within 180 days    Sodium  Date Value Ref Range Status  04/16/2022 138 135 - 145 mmol/L Final  07/22/2021 141 134 - 144 mmol/L Final         Passed - Cr in normal range and within 180 days    Creatinine, Ser  Date Value Ref Range Status  04/16/2022 0.83 0.61 - 1.24 mg/dL Final         Passed - eGFR is 10 or above and within 180 days    GFR calc Af Amer  Date Value Ref Range Status  02/19/2019 >60 >60 mL/min Final   GFR, Estimated  Date Value Ref Range Status  04/16/2022 >60 >60 mL/min Final    Comment:    (NOTE) Calculated using the CKD-EPI Creatinine Equation (2021)    eGFR  Date Value Ref Range Status  07/22/2021 93 >59 mL/min/1.73 Final         Passed - Patient is not pregnant      Passed - Last BP in normal range    BP Readings from Last 1 Encounters:  06/03/22 110/76         Passed - Valid encounter within last 6 months    Recent Outpatient Visits          2 months ago Essential (primary) hypertension   Primary Care at [facility], Amy J, NP   6 months ago Essential hypertension   Primary Care at [facility], Amy J, NP   9 months ago Annual physical exam   Primary Care at [facility], Amy J, NP   9 months ago Essential hypertension   Primary Care at [facility], Amy J, NP   10 months ago Essential hypertension   Primary Care at [facility], Flonnie Hailstone, NP      Future  Appointments            In 2 weeks Branch, Royetta Crochet, MD [place], [facility]   In 4 weeks Laurin Coder, MD [facility]   In 1 month Camillia Herter, NP Primary Care at [facility]           . sertraline (ZOLOFT) 25 MG tablet [Pharmacy Med Name: SERTRALINE HCL 25 MG TABLET] 90 tablet 1    Sig: TAKE 1 TABLET (25 MG TOTAL) BY MOUTH DAILY.     Psychiatry:  Antidepressants - SSRI - sertraline Passed - 06/09/2022  9:31 AM      Passed - AST in normal range and within 360 days    AST  Date Value Ref Range Status  01/08/2022 33 15 - 41 U/L Final         Passed - ALT in normal range and within 360 days    ALT  Date Value Ref Range Status  01/08/2022 43 0 -  44 U/L Final         Passed - Completed PHQ-2 or PHQ-9 in the last 360 days      Passed - Valid encounter within last 6 months    Recent Outpatient Visits          2 months ago Essential (primary) hypertension   Primary Care at [facility], [place], NP   6 months ago Essential hypertension   Primary Care at [facility], Amy J, NP   9 months ago Annual physical exam   Primary Care at [facility], [place], NP   9 months ago Essential hypertension   Primary Care at [facility], Amy J, NP   10 months ago Essential hypertension   Primary Care at [facility], Flonnie Hailstone, NP      Future Appointments            In 2 weeks Branch, Royetta Crochet, MD [place] [facility], [facility]   In 4 weeks Laurin Coder, MD [facility]   In 1 month Camillia Herter, NP Primary Care at [facility]

## 2022-06-24 ENCOUNTER — Other Ambulatory Visit: Payer: Self-pay

## 2022-06-24 DIAGNOSIS — E0590 Thyrotoxicosis, unspecified without thyrotoxic crisis or storm: Secondary | ICD-10-CM

## 2022-06-24 MED ORDER — METHIMAZOLE 5 MG PO TABS: 2.5000 mg | ORAL_TABLET | ORAL | 1 refills | Status: DC

## 2022-06-25 ENCOUNTER — Encounter: Payer: Self-pay | Admitting: Gastroenterology

## 2022-06-25 ENCOUNTER — Telehealth: Payer: Self-pay | Admitting: Gastroenterology

## 2022-06-25 ENCOUNTER — Ambulatory Visit (AMBULATORY_SURGERY_CENTER): Payer: BC Managed Care – PPO | Admitting: Gastroenterology

## 2022-06-25 VITALS — BP 101/58 | HR 77 | Temp 97.3°F | Resp 11 | Ht 67.0 in | Wt 178.0 lb

## 2022-06-25 DIAGNOSIS — R9389 Abnormal findings on diagnostic imaging of other specified body structures: Secondary | ICD-10-CM | POA: Diagnosis not present

## 2022-06-25 DIAGNOSIS — K200 Eosinophilic esophagitis: Secondary | ICD-10-CM

## 2022-06-25 DIAGNOSIS — R933 Abnormal findings on diagnostic imaging of other parts of digestive tract: Secondary | ICD-10-CM

## 2022-06-25 DIAGNOSIS — K2100 Gastro-esophageal reflux disease with esophagitis, without bleeding: Secondary | ICD-10-CM

## 2022-06-25 DIAGNOSIS — K209 Esophagitis, unspecified without bleeding: Secondary | ICD-10-CM | POA: Diagnosis not present

## 2022-06-25 DIAGNOSIS — K219 Gastro-esophageal reflux disease without esophagitis: Secondary | ICD-10-CM

## 2022-06-25 DIAGNOSIS — K449 Diaphragmatic hernia without obstruction or gangrene: Secondary | ICD-10-CM

## 2022-06-25 MED ORDER — PANTOPRAZOLE SODIUM 40 MG PO TBEC: 40.0000 mg | DELAYED_RELEASE_TABLET | Freq: Two times a day (BID) | ORAL | 3 refills | Status: DC

## 2022-06-25 MED ORDER — SODIUM CHLORIDE 0.9 % IV SOLN: 500.0000 mL | Freq: Once | INTRAVENOUS | Status: DC

## 2022-06-25 NOTE — Op Note (Signed)
[facility] Patient Name: Gregg [name] Procedure Date: 06/25/2022 1:12 PM MRN: [id number] Endoscopist: Gerrit Heck , MD Age: 66 Referring MD:  Date of Birth: 1956-09-28 Gender: Male Account #: [id number] Procedure:                Upper GI endoscopy Indications:              Abnormal CT of the GI tract                           CT in January with incidentally noted mild                            circumferential wall thickening of the distal                            esophagus with adjacent 0.6 cm lymph node. Medicines:                Monitored Anesthesia Care Procedure:                Pre-Anesthesia Assessment:                           - Prior to the procedure, a History and Physical                            was performed, and patient medications and                            allergies were reviewed. The patient's tolerance of                            previous anesthesia was also reviewed. The risks                            and benefits of the procedure and the sedation                            options and risks were discussed with the patient.                            All questions were answered, and informed consent                            was obtained. Prior Anticoagulants: The patient has                            taken no previous anticoagulant or antiplatelet                            agents. ASA Grade Assessment: II - A patient with                            mild systemic disease. After reviewing the risks  and benefits, the patient was deemed in                            satisfactory condition to undergo the procedure.                           After obtaining informed consent, the endoscope was                            passed under direct vision. Throughout the                            procedure, the patient's blood pressure, pulse, and                            oxygen saturations were monitored continuously. The                             GIF [id number] #[id number] was introduced through the                            mouth, and advanced to the second part of duodenum.                            The upper GI endoscopy was accomplished without                            difficulty. The patient tolerated the procedure                            well. Scope In: Scope Out: Findings:                 LA Grade B (one or more mucosal breaks greater than                            5 mm, not extending between the tops of two mucosal                            folds) esophagitis with no bleeding was found in                            the lower third of the esophagus. Biopsies were                            taken with a cold forceps for histology. Estimated                            blood loss was minimal.                           The upper third of the esophagus and middle third  of the esophagus were normal.                           A 4 cm hiatal hernia was present.                           The entire examined stomach was normal.                           The examined duodenum was normal. Complications:            No immediate complications. Estimated Blood Loss:     Estimated blood loss was minimal. 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                            Biopsied.                           - Normal upper third of esophagus and middle third                            of esophagus.                           - 4 cm hiatal hernia.                           - Normal stomach.                           - Normal examined duodenum. Recommendation:           - Patient has a contact number available for                            emergencies. The signs and symptoms of potential                            delayed complications were discussed with the                            patient. Return to normal activities tomorrow.                             Written discharge instructions were provided to the                            patient.                           - Resume previous diet.                           - Continue present medications.                           -  Await pathology results.                           - Use Protonix (pantoprazole) 40 mg PO BID for 6                            weeks to promote mucosal healing, then reduce to 40                            mg daily and continue to titrate to the lowest                            effective dose to control reflux symtoms.                           - Return to GI clinic as scheduled.                           - Follow-up in the Pulmonary Clinic. Can consider                            repeat CT Chest after appropriate treatment of                            reflux esophagitis to evaluate for interval                            resolution. Gerrit Heck, MD 06/25/2022 1:46:55 PM

## 2022-06-25 NOTE — Telephone Encounter (Signed)
This patient called the answering service after hours reporting 2 episodes of painless rectal bleeding this afternoon and this evening.  He was concerned because he underwent upper endoscopy earlier today with Dr. Bryan Lemma, at which time he had esophageal biopsies taken with findings of esophagitis.  He has known internal hemorrhoids for which he had banding of the LL column on 06/03/2022.  Notes indicate he had an anoscopy that day showing 3 columns of internal hemorrhoids.  I reassured him this bleeding was not from his endoscopic procedure of earlier today.  He is either having bleeding from one of the remaining hemorrhoidal columns or from the site of recent banding, which I consider to be less likely given that it is 3 weeks later.  He is otherwise asymptomatic and will monitor this overnight. I told Gregg [name] he would get a call in the morning from Dr. Vivia Ewing nurse to check on him.  Gregg [name],    Please pass this to Dr. Vivia Ewing nurse in the morning and they should consult with DOD for further advice if patient has ongoing bleeding. (Dr. Bryan Lemma is out of the office 06/26/22)  - H. Danis

## 2022-06-25 NOTE — Patient Instructions (Signed)
Await pathology results.  A RX for Protonix was sent to your pharmacy.  YOU HAD AN ENDOSCOPIC PROCEDURE TODAY AT [facility]:   Refer to the procedure report that was given to you for any specific questions about what was found during the examination.  If the procedure report does not answer your questions, please call your gastroenterologist to clarify.  If you requested that your care partner not be given the details of your procedure findings, then the procedure report has been included in a sealed envelope for you to review at your convenience later.  YOU SHOULD EXPECT: Some feelings of bloating in the abdomen. Passage of more gas than usual.  Walking can help get rid of the air that was put into your GI tract during the procedure and reduce the bloating. If you had a lower endoscopy (such as a colonoscopy or flexible sigmoidoscopy) you may notice spotting of blood in your stool or on the toilet paper. If you underwent a bowel prep for your procedure, you may not have a normal bowel movement for a few days.  Please Note:  You might notice some irritation and congestion in your nose or some drainage.  This is from the oxygen used during your procedure.  There is no need for concern and it should clear up in a day or so.  SYMPTOMS TO REPORT IMMEDIATELY:   Following upper endoscopy (EGD)  Vomiting of blood or coffee ground material  New chest pain or pain under the shoulder blades  Painful or persistently difficult swallowing  New shortness of breath  Fever of 100F or higher  Black, tarry-looking stools  For urgent or emergent issues, a gastroenterologist can be reached at any hour by calling [phone]. Do not use MyChart messaging for urgent concerns.    DIET:  We do recommend a small meal at first, but then you may proceed to your regular diet.  Drink plenty of fluids but you should avoid alcoholic beverages for 24 hours.  ACTIVITY:  You should plan to take it  easy for the rest of today and you should NOT DRIVE or use heavy machinery until tomorrow (because of the sedation medicines used during the test).    FOLLOW UP: Our staff will call the number listed on your records the next business day following your procedure.  We will call around 7:15- 8:00 am to check on you and address any questions or concerns that you may have regarding the information given to you following your procedure. If we do not reach you, we will leave a message.  If you develop any symptoms (ie: fever, flu-like symptoms, shortness of breath, cough etc.) before then, please call [phone].  If you test positive for Covid 19 in the 2 weeks post procedure, please call and report this information to [place].    If any biopsies were taken you will be contacted by phone or by letter within the next 1-3 weeks.  Please call us at [phone] if you have not heard about the biopsies in 3 weeks.    SIGNATURES/CONFIDENTIALITY: You and/or your care partner have signed paperwork which will be entered into your electronic medical record.  These signatures attest to the fact that that the information above on your After Visit Summary has been reviewed and is understood.  Full responsibility of the confidentiality of this discharge information lies with you and/or your care-partner.

## 2022-06-25 NOTE — Progress Notes (Signed)
Pt's states no medical or surgical changes since previsit or office visit. 

## 2022-06-25 NOTE — Progress Notes (Signed)
Report to PACU, RN, vss, BBS= Clear.  

## 2022-06-25 NOTE — Progress Notes (Signed)
GASTROENTEROLOGY PROCEDURE H&P NOTE   Primary Care Physician: Camillia Herter, NP    Reason for Procedure:  Abnormal CT  Plan:    EGD  Patient is appropriate for endoscopic procedure(s) in the ambulatory ([place]) setting.  The nature of the procedure, as well as the risks, benefits, and alternatives were carefully and thoroughly reviewed with the patient. Ample time for discussion and questions allowed. The patient understood, was satisfied, and agreed to proceed.     HPI: Gregg [name] is a 66 y.o. male who presents for EGD for evaluation of abnormal finding on recent CT.   CT in January with incidentally noted mild circumferential wall thickening of the distal esophagus with adjacent 0.6 cm lymph node.   Endoscopic History: - Colonoscopy (10/17/2021): 3 polyps ranging 5-10 mm (path: Tubular adenoma, SSP), 30 mm polyp in proximal transverse colon biopsied and tattooed (path: Tubular adenoma), 8 mm flat transverse polyp (path: SSP), 2 flat polyps ranging 8-12 mm in descending colon (path: HP), 2 subcentimeter sigmoid/descending polyps (path: TA x1, HP x1), 2 small rectal polyps (path: HP).  Sigmoid diverticulosis, internal hemorrhoids.  Normal TI - Colonoscopy (12/31/2021): 30 mm transverse colon adenoma removed via EMR en bloc then clipped closed.  Sigmoid diverticulosis.  Internal hemorrhoids.  Repeat in 1 year  Past Medical History:  Diagnosis Date   Anxiety    Bilateral inguinal hernia 06/02/2021   GERD (gastroesophageal reflux disease)    History of kidney stones 2020   Hypertension    Sinus complaint 06/02/2021   x 1 week per pt   Wears glasses 06/02/2021    Past Surgical History:  Procedure Laterality Date   COLONOSCOPY WITH PROPOFOL N/A 12/31/2021   Procedure: COLONOSCOPY WITH PROPOFOL;  Surgeon: Lavena Bullion, DO;  Location: [facility];  Service: Gastroenterology;  Laterality: N/A;   ENDOSCOPIC MUCOSAL RESECTION N/A 12/31/2021   Procedure: ENDOSCOPIC  MUCOSAL RESECTION;  Surgeon: Lavena Bullion, DO;  Location: [facility];  Service: Gastroenterology;  Laterality: N/A;   EXTRACORPOREAL SHOCK WAVE LITHOTRIPSY Left 02/23/2019   Procedure: EXTRACORPOREAL SHOCK WAVE LITHOTRIPSY (ESWL);  Surgeon: Bjorn Loser, MD;  Location: [facility];  Service: Urology;  Laterality: Left;   HEMOSTASIS CLIP PLACEMENT  12/31/2021   Procedure: HEMOSTASIS CLIP PLACEMENT;  Surgeon: Lavena Bullion, DO;  Location: [facility];  Service: Gastroenterology;;   INGUINAL HERNIA REPAIR Bilateral 06/06/2021   Procedure: LAPAROSCOPIC BILATERAL INGUINAL HERNIA REPAIRS WITH MESH;  Surgeon: Michael Boston, MD;  Location: [place];  Service: General;  Laterality: Bilateral;  TAP BLOCK   LAPAROSCOPIC APPENDECTOMY N/A 03/07/2021   Procedure: APPENDECTOMY LAPAROSCOPIC;  Surgeon: Michael Boston, MD;  Location: [facility];  Service: General;  Laterality: N/A;   SUBMUCOSAL LIFTING INJECTION  12/31/2021   Procedure: SUBMUCOSAL LIFTING INJECTION;  Surgeon: Lavena Bullion, DO;  Location: [facility];  Service: Gastroenterology;;    Prior to Admission medications   Medication Sig Start Date End Date Taking? Authorizing Provider  albuterol (VENTOLIN HFA) 108 (90 Base) MCG/ACT inhaler Inhale 2 puffs into the lungs every 6 (six) hours as needed for wheezing or shortness of breath. 05/05/22   Olalere, Cicero Duck A, MD  atorvastatin (LIPITOR) 40 MG tablet Take 1 tablet (40 mg total) by mouth daily. 03/17/22 07/15/22  Camillia Herter, NP  BREO ELLIPTA 100-25 MCG/ACT AEPB Inhale 1 puff into the lungs daily. 05/05/22   Olalere, Ernesto Rutherford, MD  fluticasone (FLONASE) 50 MCG/ACT nasal spray Place 2 sprays into both nostrils daily. 04/02/22  Perlie Mayo, NP  losartan-hydrochlorothiazide (HYZAAR) 50-12.5 MG tablet Take 1 tablet by mouth daily. 06/09/22 09/07/22  Camillia Herter, NP  methimazole (TAPAZOLE) 5 MG tablet Take 0.5 tablets (2.5 mg total) by mouth 3 (three) times a week.  06/24/22   Shamleffer, Melanie Crazier, MD  MYRBETRIQ 50 MG TB24 tablet Take 50 mg by mouth daily. 02/25/22   [provider]  sertraline (ZOLOFT) 25 MG tablet Take 1 tablet (25 mg total) by mouth daily. 06/09/22 09/07/22  Camillia Herter, NP    Current Outpatient Medications  Medication Sig Dispense Refill   albuterol (VENTOLIN HFA) 108 (90 Base) MCG/ACT inhaler Inhale 2 puffs into the lungs every 6 (six) hours as needed for wheezing or shortness of breath. 8 g 1   atorvastatin (LIPITOR) 40 MG tablet Take 1 tablet (40 mg total) by mouth daily. 120 tablet 0   BREO ELLIPTA 100-25 MCG/ACT AEPB Inhale 1 puff into the lungs daily. 60 each 5   fluticasone (FLONASE) 50 MCG/ACT nasal spray Place 2 sprays into both nostrils daily. 16 g 0   losartan-hydrochlorothiazide (HYZAAR) 50-12.5 MG tablet Take 1 tablet by mouth daily. 90 tablet 0   methimazole (TAPAZOLE) 5 MG tablet Take 0.5 tablets (2.5 mg total) by mouth 3 (three) times a week. 20 tablet 1   MYRBETRIQ 50 MG TB24 tablet Take 50 mg by mouth daily.     sertraline (ZOLOFT) 25 MG tablet Take 1 tablet (25 mg total) by mouth daily. 90 tablet 0   No current facility-administered medications for this visit.    Allergies as of 06/25/2022   (No Known Allergies)    Family History  Problem Relation Age of Onset   Thyroid disease Mother    Thyroid disease Father    Thyroid disease Sister    Stomach cancer Neg Hx    Colon cancer Neg Hx    Esophageal cancer Neg Hx    Rectal cancer Neg Hx     Social History   Socioeconomic History   Marital status: Single    Spouse name: Not on file   Number of children: Not on file   Years of education: Not on file   Highest education level: Not on file  Occupational History   Not on file  Tobacco Use   Smoking status: Former    Packs/day: 0.50    Years: 40.00    Total pack years: 20.00    Types: Cigarettes    Quit date: 03/29/2017    Years since quitting: 5.2   Smokeless tobacco: Never  Vaping  Use   Vaping Use: Never used  Substance and Sexual Activity   Alcohol use: Yes    Comment: rare   Drug use: Never   Sexual activity: Not on file  Other Topics Concern   Not on file  Social History Narrative   Not on file   Social Determinants of Health   Financial Resource Strain: Not on file  Food Insecurity: Not on file  Transportation Needs: Not on file  Physical Activity: Not on file  Stress: Not on file  Social Connections: Not on file  Intimate Partner Violence: Not on file    Physical Exam: Vital signs in last 24 hours: '@There'$  were no vitals taken for this visit. GEN: NAD EYE: Sclerae anicteric ENT: MMM CV: Non-tachycardic Pulm: CTA b/l GI: Soft, NT/ND NEURO:  Alert & Oriented x 3   Gerrit Heck, DO [facility]   06/25/2022 1:03 PM

## 2022-06-26 ENCOUNTER — Encounter: Payer: Self-pay | Admitting: Gastroenterology

## 2022-06-26 ENCOUNTER — Telehealth: Payer: Self-pay | Admitting: *Deleted

## 2022-06-26 NOTE — Telephone Encounter (Signed)
Called and spoke with pt. See 7/6 telephone encounter for further documentation.

## 2022-06-26 NOTE — Telephone Encounter (Signed)
  Follow up Call-     06/25/2022    1:22 PM 10/17/2021    7:48 AM  Call back number  Post procedure Call Back phone  # [phone]  Permission to leave phone message Yes Yes     Patient questions:  Do you have a fever, pain , or abdominal swelling? No. Pain Score  0 *  Have you tolerated food without any problems? Yes.    Have you been able to return to your normal activities? Yes.    Do you have any questions about your discharge instructions: Diet   No. Medications  No. Follow up visit  No.  Do you have questions or concerns about your Care? Yes.  --Patient called in last night and spoke to the oncall MD.  Message was sent to Dr. Bryan Lemma to follow up.   Actions: * If pain score is 4 or above: No action needed, pain <4.

## 2022-06-26 NOTE — Telephone Encounter (Signed)
Spoke with pt. Pt stated that he had one more bowel movement that had blood in it since talking with Dr. Loletha Carrow last night. Pt has had a total of 3 bowel movements with bright red blood since bleeding started yesterday at 4 pm. Pt states that blood appeared to be inside stool this morning and when pt wiped no blood was on toilet paper. Pt reports stool is formed but at 8:30 pm last night pt stated he had a bm that was mostly mucus with blood. Pt denies any weakness, dizziness, lightheadedness, fainting, or SOB. Pt also denies any abd pain. Dr. Bryan Lemma pt. See note below, as DOD please advise.

## 2022-06-26 NOTE — Telephone Encounter (Signed)
Left message for pt to call back  °

## 2022-06-29 ENCOUNTER — Ambulatory Visit: Payer: BC Managed Care – PPO | Admitting: Internal Medicine

## 2022-06-29 ENCOUNTER — Encounter: Payer: Self-pay | Admitting: Internal Medicine

## 2022-06-29 VITALS — BP 120/60 | HR 85 | Ht 69.0 in | Wt 181.2 lb

## 2022-06-29 DIAGNOSIS — R9431 Abnormal electrocardiogram [ECG] [EKG]: Secondary | ICD-10-CM | POA: Diagnosis not present

## 2022-06-29 NOTE — Patient Instructions (Signed)

## 2022-06-29 NOTE — Progress Notes (Signed)
Cardiology Office Note:    Date:  06/29/2022   ID:  Vernie Shanks, DOB [birth year], MRN [id number]  PCP:  Camillia Herter, NP   [facility] Providers Cardiologist:  Janina Mayo, MD     Referring MD: Camillia Herter, NP   No chief complaint on file. RBBB  History of Present Illness:    Gregg [name] is a 66 y.o. male with a hx of HTN, anxiety/depression, HLD, hyperthyroidism/thyroid goiter, referral for RBBB.  He notes that his heart rates have been high. He has hyperthyroidism with TSH that was low. He is on methimazole. His TSH has normalized. He notes that his heart rates can just jump while sitting and watching TV. He is asymptomatic. His apple watch beeps and tells him. He denies syncope. No cardiac dx history. His father has hx of afib. His oldest sister had a stroke/afib. His other siblings have atrial fibrillation. His mother had hx of thyroid dx. He has normal crt. No DM2. No PAD. No PCI.  LDL 80 mg/dL 10/21/2021. He notes his blood pressures are typically in the 130Q systolic.  Interim Hx: He feels well. No palpitations.  He notes seeing pulmonology for possible diagnosis of reactive bronchitis. He denies SOB today.  Past Medical History:  Diagnosis Date   Anxiety    Bilateral inguinal hernia 06/02/2021   GERD (gastroesophageal reflux disease)    History of kidney stones 2020   Hypertension    Sinus complaint 06/02/2021   x 1 week per pt   Wears glasses 06/02/2021    Past Surgical History:  Procedure Laterality Date   COLONOSCOPY WITH PROPOFOL N/A 12/31/2021   Procedure: COLONOSCOPY WITH PROPOFOL;  Surgeon: Lavena Bullion, DO;  Location: [facility];  Service: Gastroenterology;  Laterality: N/A;   ENDOSCOPIC MUCOSAL RESECTION N/A 12/31/2021   Procedure: ENDOSCOPIC MUCOSAL RESECTION;  Surgeon: Lavena Bullion, DO;  Location: [facility];  Service: Gastroenterology;  Laterality: N/A;   EXTRACORPOREAL SHOCK WAVE LITHOTRIPSY Left 02/23/2019    Procedure: EXTRACORPOREAL SHOCK WAVE LITHOTRIPSY (ESWL);  Surgeon: Bjorn Loser, MD;  Location: [facility];  Service: Urology;  Laterality: Left;   HEMOSTASIS CLIP PLACEMENT  12/31/2021   Procedure: HEMOSTASIS CLIP PLACEMENT;  Surgeon: Lavena Bullion, DO;  Location: [facility];  Service: Gastroenterology;;   INGUINAL HERNIA REPAIR Bilateral 06/06/2021   Procedure: LAPAROSCOPIC BILATERAL INGUINAL HERNIA REPAIRS WITH MESH;  Surgeon: Michael Boston, MD;  Location: [place];  Service: General;  Laterality: Bilateral;  TAP BLOCK   LAPAROSCOPIC APPENDECTOMY N/A 03/07/2021   Procedure: APPENDECTOMY LAPAROSCOPIC;  Surgeon: Michael Boston, MD;  Location: [facility];  Service: General;  Laterality: N/A;   SUBMUCOSAL LIFTING INJECTION  12/31/2021   Procedure: SUBMUCOSAL LIFTING INJECTION;  Surgeon: Lavena Bullion, DO;  Location: [facility];  Service: Gastroenterology;;    Current Medications: No outpatient medications have been marked as taking for the 06/29/22 encounter (Appointment) with Janina Mayo, MD.     Allergies:   Patient has no known allergies.   Social History   Socioeconomic History   Marital status: Single    Spouse name: Not on file   Number of children: Not on file   Years of education: Not on file   Highest education level: Not on file  Occupational History   Not on file  Tobacco Use   Smoking status: Former    Packs/day: 0.50    Years: 40.00    Total pack years: 20.00    Types: Cigarettes  Quit date: 03/29/2017    Years since quitting: 5.2   Smokeless tobacco: Never  Vaping Use   Vaping Use: Never used  Substance and Sexual Activity   Alcohol use: Yes    Comment: rare   Drug use: Never   Sexual activity: Not on file  Other Topics Concern   Not on file  Social History Narrative   Not on file   Social Determinants of Health   Financial Resource Strain: Not on file  Food Insecurity: Not on file  Transportation Needs: Not on file   Physical Activity: Not on file  Stress: Not on file  Social Connections: Not on file     Family History: The patient's family history includes Thyroid disease in his father, mother, and sister. There is no history of Stomach cancer, Colon cancer, Esophageal cancer, or Rectal cancer.  ROS:   Please see the history of present illness.     All other systems reviewed and are negative.  EKGs/Labs/Other Studies Reviewed:    The following studies were reviewed today:   EKG:  EKG is  ordered today.  The ekg ordered today demonstrates   03/23/2022-NSR, RBBB  Recent Labs: 01/08/2022: ALT 43 04/14/2022: TSH 0.66 04/16/2022: B Natriuretic Peptide 22.2; BUN 16; Creatinine, Ser 0.83; Hemoglobin 17.2; Platelets 242; Potassium 4.0; Sodium 138  Recent Lipid Panel    Component Value Date/Time   CHOL 138 10/21/2021 0932   TRIG 139 10/21/2021 0932   HDL 33 (L) 10/21/2021 0932   CHOLHDL 4.2 10/21/2021 0932   LDLCALC 80 10/21/2021 0932     Risk Assessment/Calculations:           Physical Exam:    VS:  There were no vitals filed for this visit.    Wt Readings from Last 3 Encounters:  06/25/22 178 lb (80.7 kg)  06/03/22 178 lb 4 oz (80.9 kg)  05/05/22 172 lb (78 kg)     GEN:  Well nourished, well developed in no acute distress HEENT: Normal NECK: No JVD; No carotid bruits LYMPHATICS: No lymphadenopathy CARDIAC: RRR, no murmurs, rubs, gallops RESPIRATORY:  Clear to auscultation without rales, wheezing or rhonchi  ABDOMEN: Soft, non-tender, non-distended MUSCULOSKELETAL:  No edema; No deformity  SKIN: Warm and dry NEUROLOGIC:  Alert and oriented x 3 PSYCHIATRIC:  Normal affect   ASSESSMENT:    RBBB: this is typically benign. He is asymptomatic. He has no significant fixed split S2 , no signs of RV enlargement. Unlikely related to significant ASD. His zio was unremarkable.   Palpitations: ziopatch did not show a concerning arrhythmia  HLD: continue lipitor 40 mg  daily  PLAN:    In order of problems listed above:  No cardiac follow up needed at this time          Medication Adjustments/Labs and Tests Ordered: Current medicines are reviewed at length with the patient today.  Concerns regarding medicines are outlined above.    Signed, Janina Mayo, MD  06/29/2022 6:59 AM    [place]

## 2022-07-06 NOTE — Progress Notes (Signed)
Patient ID: Gregg [name], male    DOB: 1956/02/11  MRN: [id number]  CC: Chronic Care Management   Subjective: Gregg [name] is a 66 y.o. male who presents for chronic care management.   His concerns today include:  - Doing well on current blood pressure, cholesterol, and anxiety/depression medications without issues/concerns.  - Concern for some weight gain over the course of time. He is monitoring what he eats and exercising. Thinks weight gain may be related to hyperthyroidism. Next appointment with Endocrinology 08/17/2022. Plans to ask provider at that time regarding these concerns.  - Since last appointment established with Pulmonology.  - He is established with Cardiology.      07/15/2022    8:15 AM 03/17/2022    8:39 AM 11/25/2021    2:02 PM 09/09/2021   10:16 AM 08/08/2021   11:02 AM  Depression screen PHQ 2/9  Decreased Interest 0 0 0 0 0  Down, Depressed, Hopeless 0 0 0 0 0  PHQ - 2 Score 0 0 0 0 0  Altered sleeping     1  Tired, decreased energy     0  Change in appetite     0  Feeling bad or failure about yourself      0  Trouble concentrating     0  Moving slowly or fidgety/restless     0  Suicidal thoughts     0  PHQ-9 Score     1  Difficult doing work/chores    Not difficult at all Not difficult at all     Patient Active Problem List   Diagnosis Date Noted   Mild persistent asthma 07/10/2022   Former smoker 07/10/2022   Hyperthyroidism 01/13/2022   Symptomatic cholelithiasis 01/08/2022   S/P laparoscopic cholecystectomy 01/08/2022   Adenomatous polyp of transverse colon    Diverticulosis of colon without hemorrhage    Grade II internal hemorrhoids    Multinodular goiter 12/01/2021   Hyperlipidemia 09/11/2021   Essential hypertension 07/22/2021   Anxiety and depression 07/02/2021   History of appendicitis 07/01/2021   Acute phlegmonous appendicitis s/p lap appendectomy 03/07/2021 03/07/2021   Kidney stone 03/07/2021   Bilateral inguinal hernia  (BIH) s/p lap repair with mesh 06/06/2021 03/07/2021     Current Outpatient Medications on File Prior to Visit  Medication Sig Dispense Refill   albuterol (VENTOLIN HFA) 108 (90 Base) MCG/ACT inhaler Inhale 2 puffs into the lungs every 6 (six) hours as needed for wheezing or shortness of breath. 8 g 1   atorvastatin (LIPITOR) 40 MG tablet TAKE 1 TABLET BY MOUTH EVERY DAY 90 tablet 0   BREO ELLIPTA 100-25 MCG/ACT AEPB Inhale 1 puff into the lungs daily. 60 each 5   fluticasone (FLONASE) 50 MCG/ACT nasal spray Place 2 sprays into both nostrils daily. (Patient taking differently: Place 2 sprays into both nostrils daily as needed for allergies or rhinitis.) 16 g 0   losartan-hydrochlorothiazide (HYZAAR) 50-12.5 MG tablet Take 1 tablet by mouth daily. 90 tablet 0   methimazole (TAPAZOLE) 5 MG tablet Take 0.5 tablets (2.5 mg total) by mouth 3 (three) times a week. 20 tablet 1   MYRBETRIQ 50 MG TB24 tablet Take 50 mg by mouth daily.     pantoprazole (PROTONIX) 40 MG tablet Take 1 tablet (40 mg total) by mouth 2 (two) times daily. Use '40mg'$  BID for 6 weeks, then reduce to '40mg'$  daily 60 tablet 3   sertraline (ZOLOFT) 25 MG tablet Take 1 tablet (25 mg  total) by mouth daily. 90 tablet 0   No current facility-administered medications on file prior to visit.    No Known Allergies  Social History   Socioeconomic History   Marital status: Single    Spouse name: Not on file   Number of children: Not on file   Years of education: Not on file   Highest education level: Not on file  Occupational History   Not on file  Tobacco Use   Smoking status: Former    Packs/day: 0.50    Years: 40.00    Total pack years: 20.00    Types: Cigarettes    Quit date: 03/29/2017    Years since quitting: 5.2    Passive exposure: Past   Smokeless tobacco: Never  Vaping Use   Vaping Use: Never used  Substance and Sexual Activity   Alcohol use: Yes    Comment: rare   Drug use: Never   Sexual activity: Not on file   Other Topics Concern   Not on file  Social History Narrative   Not on file   Social Determinants of Health   Financial Resource Strain: Not on file  Food Insecurity: Not on file  Transportation Needs: Not on file  Physical Activity: Not on file  Stress: Not on file  Social Connections: Not on file  Intimate Partner Violence: Not on file    Family History  Problem Relation Age of Onset   Thyroid disease Mother    Thyroid disease Father    Thyroid disease Sister    Stomach cancer Neg Hx    Colon cancer Neg Hx    Esophageal cancer Neg Hx    Rectal cancer Neg Hx     Past Surgical History:  Procedure Laterality Date   COLONOSCOPY WITH PROPOFOL N/A 12/31/2021   Procedure: COLONOSCOPY WITH PROPOFOL;  Surgeon: Lavena Bullion, DO;  Location: [facility];  Service: Gastroenterology;  Laterality: N/A;   ENDOSCOPIC MUCOSAL RESECTION N/A 12/31/2021   Procedure: ENDOSCOPIC MUCOSAL RESECTION;  Surgeon: Lavena Bullion, DO;  Location: [facility];  Service: Gastroenterology;  Laterality: N/A;   EXTRACORPOREAL SHOCK WAVE LITHOTRIPSY Left 02/23/2019   Procedure: EXTRACORPOREAL SHOCK WAVE LITHOTRIPSY (ESWL);  Surgeon: Bjorn Loser, MD;  Location: [facility];  Service: Urology;  Laterality: Left;   HEMOSTASIS CLIP PLACEMENT  12/31/2021   Procedure: HEMOSTASIS CLIP PLACEMENT;  Surgeon: Lavena Bullion, DO;  Location: [facility];  Service: Gastroenterology;;   INGUINAL HERNIA REPAIR Bilateral 06/06/2021   Procedure: LAPAROSCOPIC BILATERAL INGUINAL HERNIA REPAIRS WITH MESH;  Surgeon: Michael Boston, MD;  Location: [facility];  Service: General;  Laterality: Bilateral;  TAP BLOCK   LAPAROSCOPIC APPENDECTOMY N/A 03/07/2021   Procedure: APPENDECTOMY LAPAROSCOPIC;  Surgeon: Michael Boston, MD;  Location: [facility];  Service: General;  Laterality: N/A;   SUBMUCOSAL LIFTING INJECTION  12/31/2021   Procedure: SUBMUCOSAL LIFTING INJECTION;  Surgeon: Lavena Bullion, DO;   Location: [facility];  Service: Gastroenterology;;    ROS: Review of Systems Negative except as stated above  PHYSICAL EXAM: BP 128/80 (BP Location: Left Arm, Patient Position: Sitting, Cuff Size: Normal)   Pulse 84   Temp 98.3 F (36.8 C)   Resp 16   Ht 5' 8.9" (1.75 m)   Wt 180 lb (81.6 kg)   SpO2 95%   BMI 26.66 kg/m   Physical Exam HENT:     Head: Normocephalic and atraumatic.  Eyes:     Extraocular Movements: Extraocular movements intact.  Conjunctiva/sclera: Conjunctivae normal.     Pupils: Pupils are equal, round, and reactive to light.  Cardiovascular:     Rate and Rhythm: Normal rate and regular rhythm.     Pulses: Normal pulses.     Heart sounds: Normal heart sounds.  Pulmonary:     Effort: Pulmonary effort is normal.     Breath sounds: Normal breath sounds.  Musculoskeletal:     Cervical back: Normal range of motion and neck supple.  Neurological:     General: No focal deficit present.     Mental Status: He is alert and oriented to person, place, and time.  Psychiatric:        Mood and Affect: Mood normal.        Behavior: Behavior normal.     ASSESSMENT AND PLAN: 1. Essential (primary) hypertension - Continue Losartan-Hydrochlorothiazide as prescribed. No refills needed as of present. Last refill 06/09/2022 x 90 days. - Counseled on blood pressure goal of less than 130/80, low-sodium, DASH diet, medication compliance, and 150 minutes of moderate intensity exercise per week as tolerated. Counseled on medication adherence and adverse effects. - Follow-up with primary provider in 3 months or sooner if needed.   2. Anxiety and depression - Patient denies thoughts of self-harm, suicidal ideations, homicidal ideations. - Continue Sertraline as prescribed. No refills needed as of present. Last refill 06/09/2022 x 90 days.  - Follow-up with primary provider in 3 months or sooner if needed.   3. Hyperlipidemia, unspecified hyperlipidemia type - Continue  Atorvastatin as prescribed. No refills needed as of present. Last refill 07/08/2022 x 90 days.  - Follow-up with primary provider as scheduled.   4. Encounter for weight management - Patient reports not ready for weight management referral/medications for weight loss as of present. Discussed some weight loss medications can affect the thyroid and that we will need medical advisement from his endocrinologist if he would like to begin in the future. Patient verbalized understanding. He is aware to follow-up with me as scheduled concerning weight management.     Patient was given the opportunity to ask questions.  Patient verbalized understanding of the plan and was able to repeat key elements of the plan. Patient was given clear instructions to go to Emergency Department or return to medical center if symptoms don't improve, worsen, or new problems develop.The patient verbalized understanding.    Return in about 3 months (around 10/15/2022) for Follow-Up or next available chronic care mgmt.  Camillia Herter, NP

## 2022-07-07 ENCOUNTER — Other Ambulatory Visit: Payer: Self-pay | Admitting: Family

## 2022-07-07 ENCOUNTER — Ambulatory Visit: Payer: BC Managed Care – PPO | Admitting: Pulmonary Disease

## 2022-07-07 DIAGNOSIS — E785 Hyperlipidemia, unspecified: Secondary | ICD-10-CM

## 2022-07-08 ENCOUNTER — Ambulatory Visit: Payer: BC Managed Care – PPO | Admitting: Gastroenterology

## 2022-07-08 ENCOUNTER — Encounter: Payer: Self-pay | Admitting: Gastroenterology

## 2022-07-08 VITALS — BP 112/62 | HR 93 | Ht 69.0 in | Wt 183.1 lb

## 2022-07-08 DIAGNOSIS — K2100 Gastro-esophageal reflux disease with esophagitis, without bleeding: Secondary | ICD-10-CM | POA: Diagnosis not present

## 2022-07-08 DIAGNOSIS — K641 Second degree hemorrhoids: Secondary | ICD-10-CM | POA: Diagnosis not present

## 2022-07-08 NOTE — Patient Instructions (Addendum)
If you are age 64 or older, your body mass index should be between 23-30. Your Body mass index is 27.04 kg/m. If this is out of the aforementioned range listed, please consider follow up with your Primary Care Provider.  If you are age 16 or younger, your body mass index should be between 19-25. Your Body mass index is 27.04 kg/m. If this is out of the aformentioned range listed, please consider follow up with your Primary Care Provider.   __________________________________________________________  The [facility] providers would like to encourage you to use [facility] to communicate with providers for non-urgent requests or questions.  Due to long hold times on the telephone, sending your provider a message by [facility] may be a faster and more efficient way to get a response.  Please allow 48 business hours for a response.  Please remember that this is for non-urgent requests.   Due to recent changes in healthcare laws, you may see the results of your imaging and laboratory studies on MyChart before your provider has had a chance to review them.  We understand that in some cases there may be results that are confusing or concerning to you. Not all laboratory results come back in the same time frame and the provider may be waiting for multiple results in order to interpret others.  Please give [place] 48 hours in order for your provider to thoroughly review all the results before contacting the office for clarification of your results.   You have been scheduled for an appointment with Dr. Bryan Lemma on 08/10/22 at 1:20 pm . Please arrive 10 minutes early for your appointment.   Thank you for choosing me and [facility].  Vito Cirigliano, D.O.  HEMORRHOID BANDING PROCEDURE    FOLLOW-UP CARE   The procedure you have had should have been relatively painless since the banding of the area involved does not have nerve endings and there is no pain sensation.  The rubber band cuts off the blood  supply to the hemorrhoid and the band may fall off as soon as 48 hours after the banding (the band may occasionally be seen in the toilet bowl following a bowel movement). You may notice a temporary feeling of fullness in the rectum which should respond adequately to plain Tylenol or Motrin.  Following the banding, avoid strenuous exercise that evening and resume full activity the next day.  A sitz bath (soaking in a warm tub) or bidet is soothing, and can be useful for cleansing the area after bowel movements.     To avoid constipation, take two tablespoons of natural wheat bran, natural oat bran, flax, Benefiber or any over the counter fiber supplement and increase your water intake to 7-8 glasses daily.    Unless you have been prescribed anorectal medication, do not put anything inside your rectum for two weeks: No suppositories, enemas, fingers, etc.  Occasionally, you may have more bleeding than usual after the banding procedure.  This is often from the untreated hemorrhoids rather than the treated one.  Don't be concerned if there is a tablespoon or so of blood.  If there is more blood than this, lie flat with your bottom higher than your head and apply an ice pack to the area. If the bleeding does not stop within a half an hour or if you feel faint, call our office at [phone] or go to the emergency room.  Problems are not common; however, if there is a substantial amount of bleeding,  severe pain, chills, fever or difficulty passing urine (very rare) or other problems, you should call us at [phone] or report to the nearest emergency room.  Do not stay seated continuously for more than 2-3 hours for a day or two after the procedure.  Tighten your buttock muscles 10-15 times every two hours and take 10-15 deep breaths every 1-2 hours.  Do not spend more than a few minutes on the toilet if you cannot empty your bowel; instead re-visit the toilet at a later time.

## 2022-07-08 NOTE — Telephone Encounter (Signed)
Requested Prescriptions  Pending Prescriptions Disp Refills  . atorvastatin (LIPITOR) 40 MG tablet [Pharmacy Med Name: ATORVASTATIN 40 MG TABLET] 120 tablet 0    Sig: TAKE 1 TABLET BY MOUTH EVERY DAY     Cardiovascular:  Antilipid - Statins Failed - 07/07/2022  9:30 AM      Failed - Lipid Panel in normal range within the last 12 months    Cholesterol, Total  Date Value Ref Range Status  10/21/2021 138 100 - 199 mg/dL Final   LDL Chol Calc (NIH)  Date Value Ref Range Status  10/21/2021 80 0 - 99 mg/dL Final   HDL  Date Value Ref Range Status  10/21/2021 33 (L) >39 mg/dL Final   Triglycerides  Date Value Ref Range Status  10/21/2021 139 0 - 149 mg/dL Final         Passed - Patient is not pregnant      Passed - Valid encounter within last 12 months    Recent Outpatient Visits          3 months ago Essential (primary) hypertension   Primary Care at [facility], Amy J, NP   7 months ago Essential hypertension   Primary Care at [facility], Amy J, NP   10 months ago Annual physical exam   Primary Care at [facility], [place], NP   10 months ago Essential hypertension   Primary Care at [facility], Amy J, NP   11 months ago Essential hypertension   Primary Care at [facility], Flonnie Hailstone, NP      Future Appointments            In 2 days Belenda Cruise, Karie Schwalbe, NP [facility]   In 1 week Camillia Herter, NP Primary Care at [facility]

## 2022-07-08 NOTE — Progress Notes (Signed)
Chief Complaint:    Symptomatic Internal Hemorrhoids; Hemorrhoid Band Ligation. GERD, medication management  GI History: 66 year old male with index screening colonoscopy in 09/2021 after positive Cologuard which was notable for large polyp as outlined below.  Otherwise was without GI symptoms and no known family history of colon cancer or related malignancies.  - 08/2021: Cologuard positive.  Normal CBC and liver enzymes - 10/17/2021: Colonoscopy: 3 polyps ranging 5-10 mm (path: Tubular adenoma, SSP), 30 mm polyp in proximal transverse colon biopsied and tattooed (path: Tubular adenoma), 8 mm flat transverse polyp (path: SSP), 2 flat polyps ranging 8-12 mm in descending colon (path: HP), 2 subcentimeter sigmoid/descending polyps (path: TA x1, HP x1), 2 small rectal polyps (path: HP).  Sigmoid diverticulosis, internal hemorrhoids.  Normal TI -10/20/122: CEA Normal - 12/31/2021: Colonoscopy: 30 mm transverse colon adenoma removed via EMR en bloc then clipped closed.  Sigmoid diverticulosis.  Internal hemorrhoids.  Repeat in 1 year - 01/08/2022: CT abdomen/pelvis (indication: Acute abdominal pain): Normal liver, cholelithiasis without cholecystitis or duct dilation.  Mild circumferential wall thickening of distal esophagus above the GE junction with adjacent lymph node in the posterior mediastinum measuring 0.6 cm.  Postsurgical changes c/w prior appendectomy - 01/08/2022: RUQ [place]: Cholelithiasis with upper normal GB wall thickness and minimal edema.  No duct dilation.  CBD 6 mm - 01/08/2022: Robotic assisted cholecystectomy - 06/03/2022: Banding of LL hemorrhoid - 06/25/2022: EGD: LA Grade B esophagitis, 4 cm hiatal hernia.  Was started on Protonix 40 mg BID x6 weeks with plan to titrate     Endoscopic History: - Colonoscopy (10/17/2021): 3 polyps ranging 5-10 mm (path: Tubular adenoma, SSP), 30 mm polyp in proximal transverse colon biopsied and tattooed (path: Tubular adenoma), 8 mm flat transverse polyp  (path: SSP), 2 flat polyps ranging 8-12 mm in descending colon (path: HP), 2 subcentimeter sigmoid/descending polyps (path: TA x1, HP x1), 2 small rectal polyps (path: HP).  Sigmoid diverticulosis, internal hemorrhoids.  Normal TI - Colonoscopy (12/31/2021): 30 mm transverse colon adenoma removed via EMR en bloc then clipped closed.  Sigmoid diverticulosis.  Internal hemorrhoids.  Repeat in 1 year - EGD (06/2022): LA Grade B esophagitis (path: Reflux changes), 4 cm hiatal hernia, normal stomach and duodenum.  Started on Protonix 40 mg twice daily x6 weeks  HPI:     Patient is a 66 y.o. malewith a history of symptomatic internal hemorrhoids presenting to the [facility]ology [facility] for follow-up and ongoing treatment. The patient presents with symptomatic grade 2 hemorrhoids, unresponsive to maximal medical therapy, requesting rubber band ligation of symptomatic hemorrhoidal disease.  Did well with first hemorrhoid banding on 06/03/2022.  Did have 2 episodes of painless BRBPR earlier this month (06/25/22). Sxs resolved that evening and have not recurred.   Additionally, GERD sxs have essentially resolved since starting Protonix. Had 1 episode of breakthrough earlier this week after drinking decaf coffee late at night.  Otherwise tolerating Protonix well.  No change in medical or surgical history, medications, allergies, social history since last appointment with me.   Review of systems:     No chest pain, no SOB, no fevers, no urinary sx   Past Medical History:  Diagnosis Date   Anxiety    Bilateral inguinal hernia 06/02/2021   GERD (gastroesophageal reflux disease)    History of kidney stones 2020   Hypertension    Sinus complaint 06/02/2021   x 1 week per pt   Wears glasses 06/02/2021    Patient's surgical history, family medical history,  social history, medications and allergies were all reviewed in Epic    Current Outpatient Medications  Medication Sig Dispense Refill   albuterol  (VENTOLIN HFA) 108 (90 Base) MCG/ACT inhaler Inhale 2 puffs into the lungs every 6 (six) hours as needed for wheezing or shortness of breath. 8 g 1   atorvastatin (LIPITOR) 40 MG tablet TAKE 1 TABLET BY MOUTH EVERY DAY 90 tablet 0   BREO ELLIPTA 100-25 MCG/ACT AEPB Inhale 1 puff into the lungs daily. 60 each 5   fluticasone (FLONASE) 50 MCG/ACT nasal spray Place 2 sprays into both nostrils daily. 16 g 0   losartan-hydrochlorothiazide (HYZAAR) 50-12.5 MG tablet Take 1 tablet by mouth daily. 90 tablet 0   methimazole (TAPAZOLE) 5 MG tablet Take 0.5 tablets (2.5 mg total) by mouth 3 (three) times a week. 20 tablet 1   MYRBETRIQ 50 MG TB24 tablet Take 50 mg by mouth daily.     pantoprazole (PROTONIX) 40 MG tablet Take 1 tablet (40 mg total) by mouth 2 (two) times daily. Use '40mg'$  BID for 6 weeks, then reduce to '40mg'$  daily 60 tablet 3   sertraline (ZOLOFT) 25 MG tablet Take 1 tablet (25 mg total) by mouth daily. 90 tablet 0   No current facility-administered medications for this visit.    Physical Exam:     BP 112/62   Pulse 93   Ht '5\' 9"'$  (1.753 m)   Wt 183 lb 2 oz (83.1 kg)   BMI 27.04 kg/m   GENERAL:  Pleasant male in NAD PSYCH: : Cooperative, normal affect NEURO: Alert and oriented x 3, no focal neurologic deficits Rectal exam: Sensation intact and preserved anal wink.  Grade 2 hemorrhoids noted in RA and RP positions on exam.  No external anal fissures noted. Normal sphincter tone. No palpable mass. No blood on the exam glove. (Chaperone: Renee Rival, CMA).   IMPRESSION and PLAN:    #1.  Symptomatic internal hemorrhoids: PROCEDURE NOTE: The patient presents with symptomatic grade 2 hemorrhoids, unresponsive to maximal medical therapy, requesting rubber band ligation of symptomatic hemorrhoidal disease.  All risks, benefits and alternative forms of therapy were described and informed consent was obtained.  In the Left Lateral Decubitus position, anoscopic examination revealed grade 2  hemorrhoids in the RA and RP position(s).  The anorectum was pre-medicated with RectiCare. The decision was made to band the RP internal hemorrhoid, and the [place] was used to perform band ligation without complication.  Digital anorectal examination was then performed to assure proper positioning of the band, and to adjust the banded tissue as required.  The patient was discharged home without pain or other issues.  Dietary and behavioral recommendations were given and along with follow-up instructions.     The following adjunctive treatments were recommended:  -Resume high-fiber diet with fiber supplement (i.e. Citrucel or Benefiber) with goal for soft stools without straining to have a BM. -Resume adequate fluid intake.  The patient will return in 4 for follow-up and possible additional banding as required. No complications were encountered and the patient tolerated the procedure well.      #2.  GERD with erosive esophagitis - Continue Protonix 40 mg bid for total of 6 weeks, then reduce to 40 mg daily x2 weeks.  If reflux symptoms still well controlled, titrate to 20 mg daily then every other day, and potentially prn if reflux symptoms do not recur - Continue antireflux lifestyle/dietary modifications with avoidance of exacerbating foods  #3.  Abnormal imaging  study CT in January with incidentally noted mild circumferential wall thickening of the distal esophagus with adjacent 0.6 cm lymph node.  Circumferential wall thickening certainly due to GERD with erosive esophagitis as above.  Small lymph node could be reactive inflammatory node from either the esophagitis or bronchitis he was dealing with at that time. - Plan for high-dose PPI therapy as above and can consider repeat imaging to evaluate for resolution - I will discuss with his Pulmonologist about whether or not repeat CT chest is needed to ensure resolution of this incidentally noted node  I spent an additional 20  minutes of nonprocedural time, including independent review of results as outlined above, communicating results with the patient directly, face-to-face time with the patient, coordinating care, ordering studies and medications as appropriate, and documentation.     Lavena Bullion ,DO, FACG 07/08/2022, 1:57 PM

## 2022-07-09 ENCOUNTER — Ambulatory Visit (INDEPENDENT_AMBULATORY_CARE_PROVIDER_SITE_OTHER): Payer: BC Managed Care – PPO | Admitting: Pulmonary Disease

## 2022-07-09 DIAGNOSIS — R0609 Other forms of dyspnea: Secondary | ICD-10-CM | POA: Diagnosis not present

## 2022-07-09 LAB — PULMONARY FUNCTION TEST
DL/VA % pred: 113 %
DL/VA: 4.75 ml/min/mmHg/L
DLCO cor % pred: 109 %
DLCO cor: 28.55 ml/min/mmHg
DLCO unc % pred: 109 %
DLCO unc: 28.55 ml/min/mmHg
FEF 25-75 Post: 2.33 L/sec
FEF 25-75 Pre: 2.23 L/sec
FEF2575-%Change-Post: 4 %
FEF2575-%Pred-Post: 87 %
FEF2575-%Pred-Pre: 83 %
FEV1-%Change-Post: 0 %
FEV1-%Pred-Post: 83 %
FEV1-%Pred-Pre: 82 %
FEV1-Post: 2.79 L
FEV1-Pre: 2.77 L
FEV1FVC-%Change-Post: -1 %
FEV1FVC-%Pred-Pre: 100 %
FEV6-%Change-Post: 2 %
FEV6-%Pred-Post: 88 %
FEV6-%Pred-Pre: 86 %
FEV6-Post: 3.77 L
FEV6-Pre: 3.68 L
FEV6FVC-%Pred-Post: 105 %
FEV6FVC-%Pred-Pre: 105 %
FVC-%Change-Post: 2 %
FVC-%Pred-Post: 84 %
FVC-%Pred-Pre: 82 %
FVC-Post: 3.77 L
FVC-Pre: 3.68 L
Post FEV1/FVC ratio: 74 %
Post FEV6/FVC ratio: 100 %
Pre FEV1/FVC ratio: 75 %
Pre FEV6/FVC Ratio: 100 %
RV % pred: 124 %
RV: 2.83 L
TLC % pred: 98 %
TLC: 6.68 L

## 2022-07-09 NOTE — Progress Notes (Signed)
Full PFT performed today. °

## 2022-07-09 NOTE — Patient Instructions (Signed)
Full PFT performed today. °

## 2022-07-10 ENCOUNTER — Encounter: Payer: Self-pay | Admitting: Nurse Practitioner

## 2022-07-10 ENCOUNTER — Telehealth (INDEPENDENT_AMBULATORY_CARE_PROVIDER_SITE_OTHER): Payer: BC Managed Care – PPO | Admitting: Nurse Practitioner

## 2022-07-10 VITALS — HR 71

## 2022-07-10 DIAGNOSIS — J4530 Mild persistent asthma, uncomplicated: Secondary | ICD-10-CM | POA: Insufficient documentation

## 2022-07-10 DIAGNOSIS — Z87891 Personal history of nicotine dependence: Secondary | ICD-10-CM | POA: Diagnosis not present

## 2022-07-10 NOTE — Assessment & Plan Note (Signed)
Former smoker.  Quit in 2018.  Has never had dedicated CT imaging of the chest.  We will refer him to the lung cancer screening program today.

## 2022-07-10 NOTE — Assessment & Plan Note (Signed)
Although his pulmonary function testing was normal today and he did not have any significant bronchodilator response, his constellation of previous symptoms and resolution with ICS/LABA therapy is consistent with asthma diagnosis.  We will continue him on ICS/LABA therapy and as needed albuterol.  Advised that he continue on Xyzal for trigger prevention.  Patient Instructions  Continue Breo 1 puff daily. Brush tongue and rinse mouth afterwards Continue Albuterol inhaler 2 puffs every 6 hours as needed for shortness of breath or wheezing. Notify if symptoms persist despite rescue inhaler/neb use.  Continue Xyzal 1 tab daily  Continue flonase 2 sprays each nostril daily  Referral to lung cancer screening program - someone will contact you for scheduling  Follow up in 6 months with Dr. Ander Slade. If symptoms do not improve or worsen, please contact office for sooner follow up or seek emergency care.

## 2022-07-10 NOTE — Progress Notes (Signed)
Patient ID: Gregg [name], male     DOB: Aug 30, 1956, 66 y.o.      MRN: [id number]  Chief Complaint  Patient presents with   Follow-up    Virtual Visit via Video Note  I connected with Gregg [name] on 07/10/22 at 10:30 AM EDT by a video enabled telemedicine application and verified that I am speaking with the correct person using two identifiers.  Location: Patient: Home Provider: Office   I discussed the limitations of evaluation and management by telemedicine and the availability of in person appointments. The patient expressed understanding and agreed to proceed.  History of Present Illness: 66 year old male, former smoker followed for DOE and recurrent bronchitis.  He is a patient of Dr. Judson Roch and last seen in office 05/05/2022.Past medical history significant for hypertension, multinodular goiter, hypothyroidism, anxiety and depression, HLD.  05/05/2022: OV with Dr. Ander Slade.  Recently seen in the emergency department for shortness of breath and diagnosed with bronchitis.  He was noted to have a saturation of 88% when he was there.  He was given albuterol which seemed to have helped his symptoms.  Referred to pulmonary for further evaluation.  He was able to mow his lawn a couple of days ago and the following day noted significant wheezing.  The albuterol does help on days like this.  He was also prescribed Xyzal at some point which he uses for allergies but has not been on it regularly.  Quit smoking in 2018.  Did not have significant lung disease growing up.  Works as a Customer service manager.  Does not have any pets.  Does not feel limited with activities of daily living.  Most recent chest x-ray showed no acute infiltrate.  Recent abdominal CT shows no significant infiltrate at the bases of the lungs with no significant emphysematous changes at the bases either.  Question possible obstructive lung disease with past history of smoking.  Scheduled for PFTs and sent prescription in for  [facility] for trial.  07/10/2022: Today-follow-up Patient presents today for follow-up after being started on Breo and undergoing pulmonary function testing.  His pulmonary function testing was overall normal without significant bronchodilator response.  He does report today that his breathing has been significantly better on Breo.  He has not had any recent cough or wheezing.  He feels like he is able to go outside without any difficulties and no longer is experiencing shortness of breath.  He has not had to use his albuterol inhaler since starting on the Breo.  He denies any hemoptysis, weight loss, anorexia.  He is overall doing well without any concerns or complaints.  Allergies are well controlled on Xyzal.  No Known Allergies Immunization History  Administered Date(s) Administered   Influenza-Unspecified 12/03/2020   PFIZER(Purple Top)SARS-COV-2 Vaccination 06/09/2020, 06/30/2020   Tetanus 07/19/2020   Past Medical History:  Diagnosis Date   Anxiety    Bilateral inguinal hernia 06/02/2021   GERD (gastroesophageal reflux disease)    History of kidney stones 2020   Hypertension    Sinus complaint 06/02/2021   x 1 week per pt   Wears glasses 06/02/2021    Tobacco History: Social History   Tobacco Use  Smoking Status Former   Packs/day: 0.50   Years: 40.00   Total pack years: 20.00   Types: Cigarettes   Quit date: 03/29/2017   Years since quitting: 5.2  Smokeless Tobacco Never   Counseling given: Not Answered   Outpatient Medications Prior to Visit  Medication Sig Dispense  Refill   albuterol (VENTOLIN HFA) 108 (90 Base) MCG/ACT inhaler Inhale 2 puffs into the lungs every 6 (six) hours as needed for wheezing or shortness of breath. 8 g 1   atorvastatin (LIPITOR) 40 MG tablet TAKE 1 TABLET BY MOUTH EVERY DAY 90 tablet 0   BREO ELLIPTA 100-25 MCG/ACT AEPB Inhale 1 puff into the lungs daily. 60 each 5   fluticasone (FLONASE) 50 MCG/ACT nasal spray Place 2 sprays into both nostrils  daily. 16 g 0   losartan-hydrochlorothiazide (HYZAAR) 50-12.5 MG tablet Take 1 tablet by mouth daily. 90 tablet 0   methimazole (TAPAZOLE) 5 MG tablet Take 0.5 tablets (2.5 mg total) by mouth 3 (three) times a week. 20 tablet 1   MYRBETRIQ 50 MG TB24 tablet Take 50 mg by mouth daily.     pantoprazole (PROTONIX) 40 MG tablet Take 1 tablet (40 mg total) by mouth 2 (two) times daily. Use '40mg'$  BID for 6 weeks, then reduce to '40mg'$  daily 60 tablet 3   sertraline (ZOLOFT) 25 MG tablet Take 1 tablet (25 mg total) by mouth daily. 90 tablet 0   No facility-administered medications prior to visit.     Review of Systems:   Constitutional: No weight loss or gain, night sweats, fevers, chills, fatigue, or lassitude. HEENT: No headaches, difficulty swallowing, tooth/dental problems, or sore throat. No sneezing, itching, ear ache, nasal congestion, or post nasal drip CV:  No chest pain, orthopnea, PND, swelling in lower extremities, anasarca, dizziness, palpitations, syncope Resp: No shortness of breath with exertion or at rest. No excess mucus or change in color of mucus. No productive or non-productive. No hemoptysis. No wheezing.  No chest wall deformity Skin: No rash, lesions, ulcerations MSK:  No joint pain or swelling.  No decreased range of motion.  No back pain. Neuro: No dizziness or lightheadedness.  Psych: No depression or anxiety. Mood stable.   Observations/Objective: Patient is well-developed, well-nourished in no acute distress.  ANO x3.  Resting comfortably at home.  Unlabored, regular breathing. Speech is clear and coherent with logical content.   04/16/2022 CXR: Both lungs are clear. 07/09/2022 PFTs: FVC 82, FEV1 82, ratio 74, TLC 98, DLCOcor 109.  No BD.  Assessment and Plan: Mild persistent asthma Although his pulmonary function testing was normal today and he did not have any significant bronchodilator response, his constellation of previous symptoms and resolution with ICS/LABA  therapy is consistent with asthma diagnosis.  We will continue him on ICS/LABA therapy and as needed albuterol.  Advised that he continue on Xyzal for trigger prevention.  Patient Instructions  Continue Breo 1 puff daily. Brush tongue and rinse mouth afterwards Continue Albuterol inhaler 2 puffs every 6 hours as needed for shortness of breath or wheezing. Notify if symptoms persist despite rescue inhaler/neb use.  Continue Xyzal 1 tab daily  Continue flonase 2 sprays each nostril daily  Referral to lung cancer screening program - someone will contact you for scheduling  Follow up in 6 months with Dr. Ander Slade. If symptoms do not improve or worsen, please contact office for sooner follow up or seek emergency care.     Former smoker Former smoker.  Quit in 2018.  Has never had dedicated CT imaging of the chest.  We will refer him to the lung cancer screening program today.    I discussed the assessment and treatment plan with the patient. The patient was provided an opportunity to ask questions and all were answered. The patient agreed with the plan  and demonstrated an understanding of the instructions.   The patient was advised to call back or seek an in-person evaluation if the symptoms worsen or if the condition fails to improve as anticipated.  I provided 25 minutes of non-face-to-face time during this encounter.   Clayton Bibles, NP

## 2022-07-10 NOTE — Patient Instructions (Addendum)
Continue Breo 1 puff daily. Brush tongue and rinse mouth afterwards Continue Albuterol inhaler 2 puffs every 6 hours as needed for shortness of breath or wheezing. Notify if symptoms persist despite rescue inhaler/neb use.  Continue Xyzal 1 tab daily  Continue flonase 2 sprays each nostril daily  Referral to lung cancer screening program - someone will contact you for scheduling  Follow up in 6 months with Dr. Ander Slade. If symptoms do not improve or worsen, please contact office for sooner follow up or seek emergency care.

## 2022-07-15 ENCOUNTER — Encounter: Payer: Self-pay | Admitting: Family

## 2022-07-15 ENCOUNTER — Ambulatory Visit (INDEPENDENT_AMBULATORY_CARE_PROVIDER_SITE_OTHER): Payer: BC Managed Care – PPO | Admitting: Family

## 2022-07-15 VITALS — BP 128/80 | HR 84 | Temp 98.3°F | Resp 16 | Ht 68.9 in | Wt 180.0 lb

## 2022-07-15 DIAGNOSIS — Z7689 Persons encountering health services in other specified circumstances: Secondary | ICD-10-CM | POA: Diagnosis not present

## 2022-07-15 DIAGNOSIS — E785 Hyperlipidemia, unspecified: Secondary | ICD-10-CM | POA: Diagnosis not present

## 2022-07-15 DIAGNOSIS — F32A Depression, unspecified: Secondary | ICD-10-CM

## 2022-07-15 DIAGNOSIS — F419 Anxiety disorder, unspecified: Secondary | ICD-10-CM

## 2022-07-15 DIAGNOSIS — I10 Essential (primary) hypertension: Secondary | ICD-10-CM

## 2022-07-15 NOTE — Patient Instructions (Signed)
Mediterranean Diet ?A Mediterranean diet refers to food and lifestyle choices that are based on the traditions of countries located on the [place] Sea. It focuses on eating more fruits, vegetables, whole grains, beans, nuts, seeds, and heart-healthy fats, and eating less dairy, meat, eggs, and processed foods with added sugar, salt, and fat. This way of eating has been shown to help prevent certain conditions and improve outcomes for people who have chronic diseases, like kidney disease and heart disease. ?What are tips for following this plan? ?Reading food labels ?Check the serving size of packaged foods. For foods such as rice and pasta, the serving size refers to the amount of cooked product, not dry. ?Check the total fat in packaged foods. Avoid foods that have saturated fat or trans fats. ?Check the ingredient list for added sugars, such as corn syrup. ?Shopping ? ?Buy a variety of foods that offer a balanced diet, including: ?Fresh fruits and vegetables (produce). ?Grains, beans, nuts, and seeds. Some of these may be available in unpackaged forms or large amounts (in bulk). ?Fresh seafood. ?Poultry and eggs. ?Low-fat dairy products. ?Buy whole ingredients instead of prepackaged foods. ?Buy fresh fruits and vegetables in-season from local farmers markets. ?Buy plain frozen fruits and vegetables. ?If you do not have access to quality fresh seafood, buy precooked frozen shrimp or canned fish, such as tuna, salmon, or sardines. ?Stock your pantry so you always have certain foods on hand, such as olive oil, canned tuna, canned tomatoes, rice, pasta, and beans. ?Cooking ?Cook foods with extra-virgin olive oil instead of using butter or other vegetable oils. ?Have meat as a side dish, and have vegetables or grains as your main dish. This means having meat in small portions or adding small amounts of meat to foods like pasta or stew. ?Use beans or vegetables instead of meat in common dishes like chili or  lasagna. ?Experiment with different cooking methods. Try roasting, broiling, steaming, and saut?ing vegetables. ?Add frozen vegetables to soups, stews, pasta, or rice. ?Add nuts or seeds for added healthy fats and plant protein at each meal. You can add these to yogurt, salads, or vegetable dishes. ?Marinate fish or vegetables using olive oil, lemon juice, garlic, and fresh herbs. ?Meal planning ?Plan to eat one vegetarian meal one day each week. Try to work up to two vegetarian meals, if possible. ?Eat seafood two or more times a week. ?Have healthy snacks readily available, such as: ?Vegetable sticks with hummus. ?Greek yogurt. ?Fruit and nut trail mix. ?Eat balanced meals throughout the week. This includes: ?Fruit: 2-3 servings a day. ?Vegetables: 4-5 servings a day. ?Low-fat dairy: 2 servings a day. ?Fish, poultry, or lean meat: 1 serving a day. ?Beans and legumes: 2 or more servings a week. ?Nuts and seeds: 1-2 servings a day. ?Whole grains: 6-8 servings a day. ?Extra-virgin olive oil: 3-4 servings a day. ?Limit red meat and sweets to only a few servings a month. ?Lifestyle ? ?Cook and eat meals together with your family, when possible. ?Drink enough fluid to keep your urine pale yellow. ?Be physically active every day. This includes: ?Aerobic exercise like running or swimming. ?Leisure activities like gardening, walking, or housework. ?Get 7-8 hours of sleep each night. ?If recommended by your health care provider, drink red wine in moderation. This means 1 glass a day for nonpregnant women and 2 glasses a day for men. A glass of wine equals 5 oz (150 mL). ?What foods should I eat? ?Fruits ?Apples. Apricots. Avocado. Berries. Bananas. Cherries. Dates.   Figs. Grapes. Lemons. Melon. Oranges. Peaches. Plums. Pomegranate. ?Vegetables ?Artichokes. Beets. Broccoli. Cabbage. Carrots. Eggplant. Green beans. Chard. Kale. Spinach. Onions. Leeks. Peas. Squash. Tomatoes. Peppers. Radishes. ?Grains ?Whole-grain pasta. Brown  rice. Bulgur wheat. Polenta. Couscous. Whole-wheat bread. Oatmeal. Quinoa. ?Meats and other proteins ?Beans. Almonds. Sunflower seeds. Pine nuts. Peanuts. Cod. Salmon. Scallops. Shrimp. Tuna. Tilapia. Clams. Oysters. Eggs. Poultry without skin. ?Dairy ?Low-fat milk. Cheese. Greek yogurt. ?Fats and oils ?Extra-virgin olive oil. Avocado oil. Grapeseed oil. ?Beverages ?Water. Red wine. Herbal tea. ?Sweets and desserts ?Greek yogurt with honey. Baked apples. Poached pears. Trail mix. ?Seasonings and condiments ?Basil. Cilantro. Coriander. Cumin. Mint. Parsley. Sage. Rosemary. Tarragon. Garlic. Oregano. Thyme. Pepper. Balsamic vinegar. Tahini. Hummus. Tomato sauce. Olives. Mushrooms. ?The items listed above may not be a complete list of foods and beverages you can eat. Contact a dietitian for more information. ?What foods should I limit? ?This is a list of foods that should be eaten rarely or only on special occasions. ?Fruits ?Fruit canned in syrup. ?Vegetables ?Deep-fried potatoes (french fries). ?Grains ?Prepackaged pasta or rice dishes. Prepackaged cereal with added sugar. Prepackaged snacks with added sugar. ?Meats and other proteins ?Beef. Pork. Lamb. Poultry with skin. Hot dogs. Bacon. ?Dairy ?Ice cream. Sour cream. Whole milk. ?Fats and oils ?Butter. Canola oil. Vegetable oil. Beef fat (tallow). Lard. ?Beverages ?Juice. Sugar-sweetened soft drinks. Beer. Liquor and spirits. ?Sweets and desserts ?Cookies. Cakes. Pies. Candy. ?Seasonings and condiments ?Mayonnaise. Pre-made sauces and marinades. ?The items listed above may not be a complete list of foods and beverages you should limit. Contact a dietitian for more information. ?Summary ?The Mediterranean diet includes both food and lifestyle choices. ?Eat a variety of fresh fruits and vegetables, beans, nuts, seeds, and whole grains. ?Limit the amount of red meat and sweets that you eat. ?If recommended by your health care provider, drink red wine in moderation.  This means 1 glass a day for nonpregnant women and 2 glasses a day for men. A glass of wine equals 5 oz (150 mL). ?This information is not intended to replace advice given to you by your health care provider. Make sure you discuss any questions you have with your health care provider. ?Document Revised: 01/12/2020 Document Reviewed: 11/09/2019 ?Elsevier Patient Education ? 2023 Elsevier Inc. ? ?

## 2022-07-15 NOTE — Progress Notes (Signed)
Pt presents for chronic care management f/u

## 2022-08-07 NOTE — Telephone Encounter (Signed)
error 

## 2022-08-10 ENCOUNTER — Ambulatory Visit (INDEPENDENT_AMBULATORY_CARE_PROVIDER_SITE_OTHER): Payer: BC Managed Care – PPO | Admitting: Gastroenterology

## 2022-08-10 ENCOUNTER — Encounter: Payer: Self-pay | Admitting: Gastroenterology

## 2022-08-10 VITALS — BP 126/68 | HR 93 | Ht 69.0 in | Wt 186.4 lb

## 2022-08-10 DIAGNOSIS — K219 Gastro-esophageal reflux disease without esophagitis: Secondary | ICD-10-CM

## 2022-08-10 DIAGNOSIS — K641 Second degree hemorrhoids: Secondary | ICD-10-CM

## 2022-08-10 DIAGNOSIS — K649 Unspecified hemorrhoids: Secondary | ICD-10-CM

## 2022-08-10 NOTE — Progress Notes (Signed)
Chief Complaint:    Symptomatic Internal Hemorrhoids; Hemorrhoid Band Ligation  GI History: 66 year old male with index screening colonoscopy in 09/2021 after positive Cologuard which was notable for large polyp as outlined below.  Otherwise was without GI symptoms and no known family history of colon cancer or related malignancies.  - 08/2021: Cologuard positive.  Normal CBC and liver enzymes - 10/17/2021: Colonoscopy: 3 polyps ranging 5-10 mm (path: Tubular adenoma, SSP), 30 mm polyp in proximal transverse colon biopsied and tattooed (path: Tubular adenoma), 8 mm flat transverse polyp (path: SSP), 2 flat polyps ranging 8-12 mm in descending colon (path: HP), 2 subcentimeter sigmoid/descending polyps (path: TA x1, HP x1), 2 small rectal polyps (path: HP).  Sigmoid diverticulosis, internal hemorrhoids.  Normal TI -10/20/122: CEA Normal - 12/31/2021: Colonoscopy: 30 mm transverse colon adenoma removed via EMR en bloc then clipped closed.  Sigmoid diverticulosis.  Internal hemorrhoids.  Repeat in 1 year - 01/08/2022: CT abdomen/pelvis (indication: Acute abdominal pain): Normal liver, cholelithiasis without cholecystitis or duct dilation.  Mild circumferential wall thickening of distal esophagus above the GE junction with adjacent lymph node in the posterior mediastinum measuring 0.6 cm.  Postsurgical changes c/w prior appendectomy - 01/08/2022: RUQ [place]: Cholelithiasis with upper normal GB wall thickness and minimal edema.  No duct dilation.  CBD 6 mm - 01/08/2022: Robotic assisted cholecystectomy - 06/03/2022: Banding of LL hemorrhoid - 06/25/2022: EGD: LA Grade B esophagitis, 4 cm hiatal hernia.  Was started on Protonix 40 mg BID x6 weeks with plan to titrate - 07/08/2022: Banding of RP hemorrhoid     Endoscopic History: - Colonoscopy (10/17/2021): 3 polyps ranging 5-10 mm (path: Tubular adenoma, SSP), 30 mm polyp in proximal transverse colon biopsied and tattooed (path: Tubular adenoma), 8 mm flat  transverse polyp (path: SSP), 2 flat polyps ranging 8-12 mm in descending colon (path: HP), 2 subcentimeter sigmoid/descending polyps (path: TA x1, HP x1), 2 small rectal polyps (path: HP).  Sigmoid diverticulosis, internal hemorrhoids.  Normal TI - Colonoscopy (12/31/2021): 30 mm transverse colon adenoma removed via EMR en bloc then clipped closed.  Sigmoid diverticulosis.  Internal hemorrhoids.  Repeat in 1 year - EGD (06/2022): LA Grade B esophagitis (path: Reflux changes), 4 cm hiatal hernia, normal stomach and duodenum.  Started on Protonix 40 mg twice daily x6 weeks  HPI:     Patient is a 66 y.o. malewith a history of symptomatic internal hemorrhoids presenting to the [facility] for follow-up and ongoing treatment. The patient presents with symptomatic grade 2 hemorrhoids, unresponsive to maximal medical therapy, requesting rubber band ligation of symptomatic hemorrhoidal disease.  Separately, reflux symptoms have been generally well controlled since decreasing Protonix to 40 mg/day.  Did have some breakthrough yesterday, which quickly relieved with Tums.  He was seen in follow-up in the Pulmonary Clinic on 07/10/2022.  Normal PFTs, breathing improved since starting Breo and no longer requiring albuterol inhaler prn.  Allergies well controlled with Xyzal.  He was set up for lung cancer screening program with plan for dedicated CT chest and follow-up in 6 months.  No change in medical or surgical history, medications, allergies, social history since last appointment with me.  No new imaging or labs for review today.   Review of systems:     No chest pain, no SOB, no fevers, no urinary sx   Past Medical History:  Diagnosis Date   Anxiety    Bilateral inguinal hernia 06/02/2021   GERD (gastroesophageal reflux disease)    History of kidney  stones 2020   Hypertension    Sinus complaint 06/02/2021   x 1 week per pt   Wears glasses 06/02/2021    Patient's surgical history,  family medical history, social history, medications and allergies were all reviewed in Epic    Current Outpatient Medications  Medication Sig Dispense Refill   albuterol (VENTOLIN HFA) 108 (90 Base) MCG/ACT inhaler Inhale 2 puffs into the lungs every 6 (six) hours as needed for wheezing or shortness of breath. 8 g 1   atorvastatin (LIPITOR) 40 MG tablet TAKE 1 TABLET BY MOUTH EVERY DAY 90 tablet 0   BREO ELLIPTA 100-25 MCG/ACT AEPB Inhale 1 puff into the lungs daily. 60 each 5   fluticasone (FLONASE) 50 MCG/ACT nasal spray Place 2 sprays into both nostrils daily. (Patient taking differently: Place 2 sprays into both nostrils daily as needed for allergies or rhinitis.) 16 g 0   losartan-hydrochlorothiazide (HYZAAR) 50-12.5 MG tablet Take 1 tablet by mouth daily. 90 tablet 0   methimazole (TAPAZOLE) 5 MG tablet Take 0.5 tablets (2.5 mg total) by mouth 3 (three) times a week. 20 tablet 1   MYRBETRIQ 50 MG TB24 tablet Take 50 mg by mouth daily.     pantoprazole (PROTONIX) 40 MG tablet Take 1 tablet (40 mg total) by mouth 2 (two) times daily. Use '40mg'$  BID for 6 weeks, then reduce to '40mg'$  daily 60 tablet 3   sertraline (ZOLOFT) 25 MG tablet Take 1 tablet (25 mg total) by mouth daily. 90 tablet 0   No current facility-administered medications for this visit.    Physical Exam:     There were no vitals taken for this visit.  GENERAL:  Pleasant, conversive in NAD PSYCH: : Cooperative, normal affect NEURO: Alert and oriented x 3, no focal neurologic deficits Rectal exam: Sensation intact and preserved anal wink.  External skin tags present.  Grade 2 hemorrhoid in the RA position on exam.  No external anal fissures noted. Normal sphincter tone. No palpable mass. No blood on the exam glove. (Chaperone: Renee Rival, CMA).   IMPRESSION and PLAN:    #1.  Symptomatic internal hemorrhoids: PROCEDURE NOTE: The patient presents with symptomatic grade 2 hemorrhoids, unresponsive to maximal medical  therapy, requesting rubber band ligation of symptomatic hemorrhoidal disease.  All risks, benefits and alternative forms of therapy were described and informed consent was obtained.  In the Left Lateral Decubitus position, anoscopic examination revealed grade 2 hemorrhoids in the RA position(s).  The anorectum was pre-medicated with RectiCare. The decision was made to band the RA internal hemorrhoid, and the Ayden was used to perform band ligation without complication.  Digital anorectal examination was then performed to assure proper positioning of the band, and to adjust the banded tissue as required.  The patient was discharged home without pain or other issues.  Dietary and behavioral recommendations were given and along with follow-up instructions.     The following adjunctive treatments were recommended:  -Resume high-fiber diet with fiber supplement (i.e. Citrucel or Benefiber) with goal for soft stools without straining to have a BM. -Resume adequate fluid intake.  The patient will return as needed if return of hemorrhoidal symptoms for evaluation and possible additional banding as required. No complications were encountered and the patient tolerated the procedure well.       #2.  GERD with erosive esophagitis - Continue Protonix 40 mg daily - Ok to use Tums or other OTC antacid for breakthrough - If having more frequent/regular breakthrough, may  need to go back up to high-dose PPI  - Continue antireflux lifestyle/dietary modifications with avoidance of exacerbating foods  #3.  Abnormal imaging study #4.  Mild persistent asthma #5.  Former smoker CT in January with incidentally noted mild circumferential wall thickening of the distal esophagus with adjacent 0.6 cm lymph node.  Circumferential wall thickening certainly due to GERD with erosive esophagitis as above.  Small lymph node could be reactive inflammatory node from either the esophagitis or bronchitis he was  dealing with at that time. - Completed high-dose PPI therapy as above - Plan to enroll in lung cancer screening program (former smoker) with dedicated CT chest.  This should be able to reevaluate the lymph node in question.  Follow-up with Pulmonary Clinic as scheduled  RTC in 12 months or sooner as needed      Lavena Bullion ,DO, FACG 08/10/2022, 1:12 PM

## 2022-08-10 NOTE — Patient Instructions (Signed)
_______________________________________________________  If you are age 64 or older, your body mass index should be between 23-30. Your Body mass index is 27.53 kg/m. If this is out of the aforementioned range listed, please consider follow up with your Primary Care Provider.  If you are age 54 or younger, your body mass index should be between 19-25. Your Body mass index is 27.53 kg/m. If this is out of the aformentioned range listed, please consider follow up with your Primary Care Provider.   ________________________________________________________  The [place] [facility] providers would like to encourage you to use [facility] to communicate with providers for non-urgent requests or questions.  Due to long hold times on the telephone, sending your provider a message by [facility] may be a faster and more efficient way to get a response.  Please allow 48 business hours for a response.  Please remember that this is for non-urgent requests.  _______________________________________________________  Follow up in 1 year.  HEMORRHOID BANDING PROCEDURE    FOLLOW-UP CARE   The procedure you have had should have been relatively painless since the banding of the area involved does not have nerve endings and there is no pain sensation.  The rubber band cuts off the blood supply to the hemorrhoid and the band may fall off as soon as 48 hours after the banding (the band may occasionally be seen in the toilet bowl following a bowel movement). You may notice a temporary feeling of fullness in the rectum which should respond adequately to plain Tylenol or Motrin.  Following the banding, avoid strenuous exercise that evening and resume full activity the next day.  A sitz bath (soaking in a warm tub) or bidet is soothing, and can be useful for cleansing the area after bowel movements.     To avoid constipation, take two tablespoons of natural wheat bran, natural oat bran, flax, Benefiber or any over the counter fiber  supplement and increase your water intake to 7-8 glasses daily.    Unless you have been prescribed anorectal medication, do not put anything inside your rectum for two weeks: No suppositories, enemas, fingers, etc.  Occasionally, you may have more bleeding than usual after the banding procedure.  This is often from the untreated hemorrhoids rather than the treated one.  Don't be concerned if there is a tablespoon or so of blood.  If there is more blood than this, lie flat with your bottom higher than your head and apply an ice pack to the area. If the bleeding does not stop within a half an hour or if you feel faint, call our office at [phone] or go to the emergency room.  Problems are not common; however, if there is a substantial amount of bleeding, severe pain, chills, fever or difficulty passing urine (very rare) or other problems, you should call us at [phone] or report to the nearest emergency room.  Do not stay seated continuously for more than 2-3 hours for a day or two after the procedure.  Tighten your buttock muscles 10-15 times every two hours and take 10-15 deep breaths every 1-2 hours.  Do not spend more than a few minutes on the toilet if you cannot empty your bowel; instead re-visit the toilet at a later time.   It was a pleasure to see you today!  Vito Cirigliano, D.O.

## 2022-08-16 NOTE — Progress Notes (Unsigned)
Name: Gregg [name]  MRN/ DOB: [id number], 1956-08-30    Age/ Sex: 66 y.o., male     PCP: Camillia Herter, NP   Reason for Endocrinology Evaluation: Hyperthyroidism     Initial Endocrinology Clinic Visit: 12/01/2021    PATIENT IDENTIFIER: Gregg [name] is a 66 y.o., male with a past medical history of MNG. He has followed with [facility] since 12/01/2021 for consultative assistance with management of his hyperthyroidism.   HISTORICAL SUMMARY: The patient was first diagnosed with  hyperthyroidism in 2022.   Thyroid ultrasound showed multiple nodules in 11/2021 with multiple nodules meeting FNA criteria, but the pt declines   Pt was started on Methimazole   Pt with FH of thyroid disease   SUBJECTIVE:     Today (08/18/2022):  Gregg [name] is here for hyperthyroidism and multinodular goiter. 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  Has noted increase in neck size  He does not like needles so he declines FNA    Methimazole 5 mg, half a tablet every other day    Sees cardiology and Follows with  GI  for GERD    HISTORY:  Past Medical History:  Past Medical History:  Diagnosis Date   Anxiety    Bilateral inguinal hernia 06/02/2021   GERD (gastroesophageal reflux disease)    History of kidney stones 2020   Hypertension    Sinus complaint 06/02/2021   x 1 week per pt   Wears glasses 06/02/2021   Past Surgical History:  Past Surgical History:  Procedure Laterality Date   COLONOSCOPY WITH PROPOFOL N/A 12/31/2021   Procedure: COLONOSCOPY WITH PROPOFOL;  Surgeon: Lavena Bullion, DO;  Location: [facility];  Service: Gastroenterology;  Laterality: N/A;   ENDOSCOPIC MUCOSAL RESECTION N/A 12/31/2021   Procedure: ENDOSCOPIC MUCOSAL RESECTION;  Surgeon: Lavena Bullion, DO;  Location: [facility];  Service: Gastroenterology;  Laterality: N/A;   EXTRACORPOREAL SHOCK  WAVE LITHOTRIPSY Left 02/23/2019   Procedure: EXTRACORPOREAL SHOCK WAVE LITHOTRIPSY (ESWL);  Surgeon: Bjorn Loser, MD;  Location: [facility];  Service: Urology;  Laterality: Left;   HEMOSTASIS CLIP PLACEMENT  12/31/2021   Procedure: HEMOSTASIS CLIP PLACEMENT;  Surgeon: Lavena Bullion, DO;  Location: [facility];  Service: Gastroenterology;;   INGUINAL HERNIA REPAIR Bilateral 06/06/2021   Procedure: LAPAROSCOPIC BILATERAL INGUINAL HERNIA REPAIRS WITH MESH;  Surgeon: Michael Boston, MD;  Location: [place];  Service: General;  Laterality: Bilateral;  TAP BLOCK   LAPAROSCOPIC APPENDECTOMY N/A 03/07/2021   Procedure: APPENDECTOMY LAPAROSCOPIC;  Surgeon: Michael Boston, MD;  Location: [facility];  Service: General;  Laterality: N/A;   SUBMUCOSAL LIFTING INJECTION  12/31/2021   Procedure: SUBMUCOSAL LIFTING INJECTION;  Surgeon: Lavena Bullion, DO;  Location: [facility];  Service: Gastroenterology;;   Social History:  reports that he quit smoking about 5 years ago. His smoking use included cigarettes. He has a 20.00 pack-year smoking history. He has been exposed to tobacco smoke. He has never used smokeless tobacco. He reports current alcohol use. He reports that he does not use drugs. Family History:  Family History  Problem Relation Age of Onset   Thyroid disease Mother    Thyroid disease Father    Thyroid disease Sister    Stomach cancer Neg Hx    Colon cancer Neg Hx    Esophageal cancer Neg Hx    Rectal cancer Neg Hx      HOME  MEDICATIONS: Allergies as of 08/17/2022   Not on File      Medication List        Accurate as of August 17, 2022 11:59 PM. If you have any questions, ask your nurse or doctor.          STOP taking these medications    Myrbetriq 50 MG Tb24 tablet Generic drug: mirabegron ER Stopped by: Ibtehal J Shamleffer, MD       TAKE these medications    albuterol 108 (90 Base) MCG/ACT inhaler Commonly known as: VENTOLIN HFA Inhale 2  puffs into the lungs every 6 (six) hours as needed for wheezing or shortness of breath.   atorvastatin 40 MG tablet Commonly known as: LIPITOR TAKE 1 TABLET BY MOUTH EVERY DAY   Breo Ellipta 100-25 MCG/ACT Aepb Generic drug: fluticasone furoate-vilanterol Inhale 1 puff into the lungs daily.   fluticasone 50 MCG/ACT nasal spray Commonly known as: FLONASE Place 2 sprays into both nostrils daily. What changed:  when to take this reasons to take this   losartan-hydrochlorothiazide 50-12.5 MG tablet Commonly known as: HYZAAR Take 1 tablet by mouth daily.   methimazole 5 MG tablet Commonly known as: TAPAZOLE Take 0.5 tablets (2.5 mg total) by mouth 3 (three) times a week. What changed: additional instructions   pantoprazole 40 MG tablet Commonly known as: PROTONIX Take 1 tablet (40 mg total) by mouth 2 (two) times daily. Use 40mg BID for 6 weeks, then reduce to 40mg daily   sertraline 25 MG tablet Commonly known as: Zoloft Take 1 tablet (25 mg total) by mouth daily.          OBJECTIVE:   PHYSICAL EXAM: VS: BP 124/70 (BP Location: Left Arm, Patient Position: Sitting, Cuff Size: Small)   Pulse 84   Ht 5' 9" (1.753 m)   Wt 185 lb (83.9 kg)   SpO2 95%   BMI 27.32 kg/m    EXAM: General: Pt appears well and is in NAD  Eyes: External eye exam normal without stare, lid lag or exophthalmos.  EOM intact.    Neck: General: Supple without adenopathy. Thyroid: Bilateral nodules appreciated.  Lungs: Clear with good BS bilat with no rales, rhonchi, or wheezes  Heart: Auscultation: RRR.  Abdomen: Normoactive bowel sounds, soft, nontender, without masses or organomegaly palpable  Extremities:  BL LE: No pretibial edema normal ROM and strength.  Mental Status: Judgment, insight: Intact Orientation: Oriented to time, place, and person Mood and affect: No depression, anxiety, or agitation     DATA REVIEWED:   Latest Reference Range & Units 08/17/22 09:45  TSH 0.35 - 5.50  uIU/mL 1.13  Triiodothyronine (T3) 76 - 181 ng/dL 130  T4,Free(Direct) 0.60 - 1.60 ng/dL 0.78     ASSESSMENT / PLAN / RECOMMENDATIONS:   Hyperthyroidism   -Patient is clinically and biochemically euthyroid -No changes to methimazole   Medications  Methimazole 5 mg, half a tablet every other day    2. MNG :  - Pt with 9 nodules and a total of 4 met FNA criteria. We discussed risk of thyroid cancer 3-5% in each nodule, he has fear of needles hence declined FNA, we opted to proceed with thyroid uptake and scan and that was if the nodules are " hot" no need to Bx, but if they are cold, he will have to make a decision where to proceed with FNA vs total thyroidectomy - We discussed the need for life long LT-4 replacement with total thyroidectomy      Follow-up in 4 months  Signed electronically by: Abby Jaralla Shamleffer, MD   Endocrinology  [facility] [address] [place], [place] [address] Phone: [phone] FAX: [phone]      CC: Stephens, Amy J, NP [address] [place] [address] Phone: [phone]  Fax: [phone]   Return to Endocrinology clinic as below: Future Appointments  Date Time Provider Department Center  10/15/2022  8:20 AM Stephens, Amy J, NP [facility] None  12/17/2022  1:00 PM [facility] US 1 [facility]  01/08/2023  8:10 AM Shamleffer, Ibtehal Jaralla, MD [facility] None     

## 2022-08-17 ENCOUNTER — Encounter: Payer: Self-pay | Admitting: Internal Medicine

## 2022-08-17 ENCOUNTER — Ambulatory Visit: Payer: BC Managed Care – PPO | Admitting: Internal Medicine

## 2022-08-17 VITALS — BP 124/70 | HR 84 | Ht 69.0 in | Wt 185.0 lb

## 2022-08-17 DIAGNOSIS — E042 Nontoxic multinodular goiter: Secondary | ICD-10-CM | POA: Diagnosis not present

## 2022-08-17 DIAGNOSIS — E0590 Thyrotoxicosis, unspecified without thyrotoxic crisis or storm: Secondary | ICD-10-CM

## 2022-08-17 LAB — T4, FREE: Free T4: 0.78 ng/dL (ref 0.60–1.60)

## 2022-08-17 LAB — TSH: TSH: 1.13 u[IU]/mL (ref 0.35–5.50)

## 2022-08-18 LAB — T3: T3, Total: 130 ng/dL (ref 76–181)

## 2022-08-27 ENCOUNTER — Other Ambulatory Visit: Payer: Self-pay

## 2022-08-27 DIAGNOSIS — Z87891 Personal history of nicotine dependence: Secondary | ICD-10-CM

## 2022-08-27 DIAGNOSIS — Z122 Encounter for screening for malignant neoplasm of respiratory organs: Secondary | ICD-10-CM

## 2022-08-31 ENCOUNTER — Encounter: Payer: Self-pay | Admitting: Physician Assistant

## 2022-08-31 ENCOUNTER — Other Ambulatory Visit: Payer: Self-pay | Admitting: Family

## 2022-08-31 ENCOUNTER — Telehealth: Payer: BC Managed Care – PPO | Admitting: Physician Assistant

## 2022-08-31 DIAGNOSIS — F419 Anxiety disorder, unspecified: Secondary | ICD-10-CM

## 2022-08-31 DIAGNOSIS — L989 Disorder of the skin and subcutaneous tissue, unspecified: Secondary | ICD-10-CM | POA: Diagnosis not present

## 2022-08-31 DIAGNOSIS — I10 Essential (primary) hypertension: Secondary | ICD-10-CM

## 2022-08-31 MED ORDER — TRIAMCINOLONE ACETONIDE 0.1 % EX CREA: 1.0000 | TOPICAL_CREAM | Freq: Two times a day (BID) | CUTANEOUS | 0 refills | Status: DC

## 2022-08-31 NOTE — Progress Notes (Signed)
Virtual Visit Consent   Gregg [name], you are scheduled for a virtual visit with a [facility] provider today. Just as with appointments in the office, your consent must be obtained to participate. Your consent will be active for this visit and any virtual visit you may have with one of our providers in the next 365 days. If you have a MyChart account, a copy of this consent can be sent to you electronically.  As this is a virtual visit, video technology does not allow for your provider to perform a traditional examination. This may limit your provider's ability to fully assess your condition. If your provider identifies any concerns that need to be evaluated in person or the need to arrange testing (such as labs, EKG, etc.), we will make arrangements to do so. Although advances in technology are sophisticated, we cannot ensure that it will always work on either your end or our end. If the connection with a video visit is poor, the visit may have to be switched to a telephone visit. With either a video or telephone visit, we are not always able to ensure that we have a secure connection.  By engaging in this virtual visit, you consent to the provision of healthcare and authorize for your insurance to be billed (if applicable) for the services provided during this visit. Depending on your insurance coverage, you may receive a charge related to this service.  I need to obtain your verbal consent now. Are you willing to proceed with your visit today? Gregg [name] has provided verbal consent on 08/31/2022 for a virtual visit (video or telephone). Mar Daring, PA-C  Date: 08/31/2022 6:30 PM  Virtual Visit via Video Note   I, Mar Daring, connected with  Gregg [name]  ([id number], 1959/09/17) on 08/31/22 at  6:15 PM EDT by a video-enabled telemedicine application and verified that I am speaking with the correct person using two identifiers.  Location: Patient: Virtual  Visit Location Patient: Home Provider: Virtual Visit Location Provider: Home Office   I discussed the limitations of evaluation and management by telemedicine and the availability of in person appointments. The patient expressed understanding and agreed to proceed.    History of Present Illness: Gregg [name] is a 66 y.o. who identifies as a male who was assigned male at birth, and is being seen today for skin lesion. Noted to have a skin lesion on his left bicep area. He has not noticed before. Does not think it is a bug bite. It is an annular lesion that is red. It has well demarcated borders. Denies itching, pain, swelling, tenderness, drainage, fluctuance, or induration.   Does note he has been on Methimazole. Had to stop recently for upcoming radioactive iodine uptake scan.   Problems:  Patient Active Problem List   Diagnosis Date Noted   Mild persistent asthma 07/10/2022   Former smoker 07/10/2022   Hyperthyroidism 01/13/2022   Symptomatic cholelithiasis 01/08/2022   S/P laparoscopic cholecystectomy 01/08/2022   Adenomatous polyp of transverse colon    Diverticulosis of colon without hemorrhage    Grade II internal hemorrhoids    Multinodular goiter 12/01/2021   Hyperlipidemia 09/11/2021   Essential hypertension 07/22/2021   Anxiety and depression 07/02/2021   History of appendicitis 07/01/2021   Acute phlegmonous appendicitis s/p lap appendectomy 03/07/2021 03/07/2021   Kidney stone 03/07/2021   Bilateral inguinal hernia (BIH) s/p lap repair with mesh 06/06/2021 03/07/2021    Allergies: No Known Allergies Medications:  Current Outpatient Medications:    triamcinolone cream (KENALOG) 0.1 %, Apply 1 Application topically 2 (two) times daily., Disp: 30 g, Rfl: 0   albuterol (VENTOLIN HFA) 108 (90 Base) MCG/ACT inhaler, Inhale 2 puffs into the lungs every 6 (six) hours as needed for wheezing or shortness of breath., Disp: 8 g, Rfl: 1   atorvastatin (LIPITOR) 40 MG tablet,  TAKE 1 TABLET BY MOUTH EVERY DAY, Disp: 90 tablet, Rfl: 0   BREO ELLIPTA 100-25 MCG/ACT AEPB, Inhale 1 puff into the lungs daily., Disp: 60 each, Rfl: 5   fluticasone (FLONASE) 50 MCG/ACT nasal spray, Place 2 sprays into both nostrils daily. (Patient taking differently: Place 2 sprays into both nostrils daily as needed for allergies or rhinitis.), Disp: 16 g, Rfl: 0   losartan-hydrochlorothiazide (HYZAAR) 50-12.5 MG tablet, Take 1 tablet by mouth daily., Disp: 90 tablet, Rfl: 0   methimazole (TAPAZOLE) 5 MG tablet, Take 0.5 tablets (2.5 mg total) by mouth 3 (three) times a week. (Patient taking differently: Take 2.5 mg by mouth 3 (three) times a week. Every other day), Disp: 20 tablet, Rfl: 1   pantoprazole (PROTONIX) 40 MG tablet, Take 1 tablet (40 mg total) by mouth 2 (two) times daily. Use '40mg'$  BID for 6 weeks, then reduce to '40mg'$  daily, Disp: 60 tablet, Rfl: 3   sertraline (ZOLOFT) 25 MG tablet, Take 1 tablet (25 mg total) by mouth daily., Disp: 90 tablet, Rfl: 0  Observations/Objective: Patient is well-developed, well-nourished in no acute distress.  Resting comfortably at home.  Head is normocephalic, atraumatic.  No labored breathing.  Speech is clear and coherent with logical content.  Patient is alert and oriented at baseline.  Photo available in mychart media; annular, erythematous lesion with well demarcated borders. Has hyperkeratotic appearance almost  Assessment and Plan: 1. Skin lesion of left upper extremity - triamcinolone cream (KENALOG) 0.1 %; Apply 1 Application topically 2 (two) times daily.  Dispense: 30 g; Refill: 0  - No concerning features at this time - Triamcinolone prescribed to lessen size and redness - Monitor for changes - Seek in person evaluation if changes, worsens, or fails to resolve  Follow Up Instructions: I discussed the assessment and treatment plan with the patient. The patient was provided an opportunity to ask questions and all were answered. The  patient agreed with the plan and demonstrated an understanding of the instructions.  A copy of instructions were sent to the patient via MyChart unless otherwise noted below.    The patient was advised to call back or seek an in-person evaluation if the symptoms worsen or if the condition fails to improve as anticipated.  Time:  I spent 14 minutes with the patient via telehealth technology discussing the above problems/concerns.    Mar Daring, PA-C

## 2022-08-31 NOTE — Patient Instructions (Signed)
Gregg [name], thank you for joining Mar Daring, PA-C for today's virtual visit.  While this provider is not your primary care provider (PCP), if your PCP is located in our provider database this encounter information will be shared with them immediately following your visit.  Consent: (Patient) Gregg [name] provided verbal consent for this virtual visit at the beginning of the encounter.  Current Medications:  Current Outpatient Medications:    albuterol (VENTOLIN HFA) 108 (90 Base) MCG/ACT inhaler, Inhale 2 puffs into the lungs every 6 (six) hours as needed for wheezing or shortness of breath., Disp: 8 g, Rfl: 1   atorvastatin (LIPITOR) 40 MG tablet, TAKE 1 TABLET BY MOUTH EVERY DAY, Disp: 90 tablet, Rfl: 0   BREO ELLIPTA 100-25 MCG/ACT AEPB, Inhale 1 puff into the lungs daily., Disp: 60 each, Rfl: 5   fluticasone (FLONASE) 50 MCG/ACT nasal spray, Place 2 sprays into both nostrils daily. (Patient taking differently: Place 2 sprays into both nostrils daily as needed for allergies or rhinitis.), Disp: 16 g, Rfl: 0   losartan-hydrochlorothiazide (HYZAAR) 50-12.5 MG tablet, Take 1 tablet by mouth daily., Disp: 90 tablet, Rfl: 0   methimazole (TAPAZOLE) 5 MG tablet, Take 0.5 tablets (2.5 mg total) by mouth 3 (three) times a week. (Patient taking differently: Take 2.5 mg by mouth 3 (three) times a week. Every other day), Disp: 20 tablet, Rfl: 1   pantoprazole (PROTONIX) 40 MG tablet, Take 1 tablet (40 mg total) by mouth 2 (two) times daily. Use '40mg'$  BID for 6 weeks, then reduce to '40mg'$  daily, Disp: 60 tablet, Rfl: 3   sertraline (ZOLOFT) 25 MG tablet, Take 1 tablet (25 mg total) by mouth daily., Disp: 90 tablet, Rfl: 0   triamcinolone cream (KENALOG) 0.1 %, Apply 1 Application topically 2 (two) times daily., Disp: 30 g, Rfl: 0   Medications ordered in this encounter:  Meds ordered this encounter  Medications   DISCONTD: triamcinolone cream (KENALOG) 0.1 %    Sig: Apply 1  Application topically 2 (two) times daily.    Dispense:  30 g    Refill:  0    Order Specific Question:   Supervising Provider    Answer:   Chase Picket [[id number]]   DISCONTD: triamcinolone cream (KENALOG) 0.1 %    Sig: Apply 1 Application topically 2 (two) times daily.    Dispense:  30 g    Refill:  0    Order Specific Question:   Supervising Provider    Answer:   Chase Picket [id number]   triamcinolone cream (KENALOG) 0.1 %    Sig: Apply 1 Application topically 2 (two) times daily.    Dispense:  30 g    Refill:  0    Sorry if duplicate, had an error message on my end so I resent    Order Specific Question:   Supervising Provider    Answer:   Chase Picket [[id number]]     *If you need refills on other medications prior to your next appointment, please contact your pharmacy*  Follow-Up: Call back or seek an in-person evaluation if the symptoms worsen or if the condition fails to improve as anticipated.  Other Instructions Actinic Keratosis An actinic keratosis is a precancerous growth on the skin. If there is more than one, the condition is called actinic keratoses. These growths appear most often on parts of the skin that get a lot of sun exposure, including the: Scalp. Face. Ears. Lips. Upper back. Forearms.  Backs of the hands. If left untreated, these growths may develop into a skin cancer called squamous cell carcinoma. It is important to have all these growths checked by a health care provider to determine the best treatment. What are the causes? Actinic keratoses are caused by getting too much ultraviolet (UV) radiation from the sun or other UV light sources. What increases the risk? You are more likely to develop this condition if you: Have light-colored skin or blue eyes. Have blond or red hair. Spend a lot of time in the sun. Do not protect your skin from the sun when outdoors. Are an older person. The risk of developing an actinic keratosis increases  with age. What are the signs or symptoms? These growths feel like scaly, rough spots of skin. Symptoms of this condition include growths that may: Be as small as a pinhead or as big as a quarter. Itch, hurt, or feel sensitive. Be skin-colored, light tan, dark tan, pink, or a combination of these colors. In most cases, the growths become red. Have a small piece of pink or gray skin (skin tag) growing from them. It may be easier to notice the growths by feeling them rather than seeing them. Sometimes, actinic keratoses disappear but may return a few days to a few weeks later. How is this diagnosed? This condition is usually diagnosed with a physical exam. A tissue sample may be removed from the growth and examined under a microscope (biopsy). How is this treated? This condition may be treated by: Scraping off the actinic keratosis (curettage). Freezing the actinic keratosis with liquid nitrogen (cryosurgery). This causes the growth to eventually fall off. Applying medicated creams or gels to destroy the cells in the growth. Applying chemicals to the growth to make the outer layers of skin peel off (chemical peel). Using photodynamic therapy. In this procedure, medicated cream is applied to the actinic keratosis. This cream increases your skin's sensitivity to light. Then, a strong light is aimed at the actinic keratosis to destroy cells in the growth. Follow these instructions at home: Skin care Apply cool, wet cloths (coolcompresses) to the affected areas. Do not scratch your skin. Check your skin regularly for any growths, especially ones that: Start to itch or bleed. Change in size, shape, or color. Caring for the treated area Keep the treated area clean and dry as told by your health care provider. Do not apply any medicine, cream, or lotion to the treated area unless your health care provider tells you to do that. Do not pick at blisters or try to break them open. This can cause  infection and scarring. If you have red or irritated skin after treatment, follow instructions from your health care provider about how to take care of the treated area. Make sure you: Wash your hands with soap and water for at least 20 seconds before and after you change your bandage (dressing). If soap and water are not available, use hand sanitizer. Change your dressing as told by your health care provider. If you have red or irritated skin after treatment, check the treated area every day for signs of infection. Check for: Redness, swelling, or pain. Fluid or blood. Warmth. Pus or a bad smell. Lifestyle Do not use any products that contain nicotine or tobacco. These products include cigarettes, chewing tobacco, and vaping devices, such as e-cigarettes. If you need help quitting, ask your health care provider. Take steps to protect your skin from the sun, such as: Avoiding the sun between  10:00 a.m. and 4:00 p.m. This is when the UV light is the strongest. Using a sunscreen or sunblock with SPF 30 or greater. Applying sunscreen before you are exposed to sunlight and reapplying as often as told by the instructions on the sunscreen container. Wearing protective gear, including: Sunglasses with UV protection. A hat and clothing that protect your skin from sunlight. Avoiding medicines that increase your sensitivity to sunlight when possible. Avoidingtanning beds and other indoor tanning devices. General instructions Take or apply over-the-counter and prescription medicines only as told by your health care provider. Return to your normal activities as told by your health care provider. Ask your health care provider what activities are safe for you. Have a skin exam done every year by a health care provider who is a skin specialist (dermatologist). Keep all follow-up visits. Your health care provider will want to check that the site has healed after treatment. Contact a health care provider  if: You notice any changes or new growths on your skin. You have swelling, pain, or redness around your treated area. You have fluid or blood coming from your treated area. Your treated area feels warm to the touch. You have pus or a bad smell coming from your treated area. You have a fever or chills. You have a blister that becomes large and painful. Summary An actinic keratosis is a precancerous growth on the skin.If left untreated, these growths can develop into skin cancer. Check your skin regularly for any growths, especially growths that start to itch or bleed, or change in size, shape, or color. Take steps to protect your skin from the sun. Contact a health care provider if you notice any changes or new growths on your skin. This information is not intended to replace advice given to you by your health care provider. Make sure you discuss any questions you have with your health care provider. Document Revised: 02/19/2022 Document Reviewed: 02/19/2022 Elsevier Patient Education  [place].    If you have been instructed to have an in-person evaluation today at a local Urgent Care facility, please use the link below. It will take you to a list of all of our available [facility], including address, phone number and hours of operation. Please do not delay care.  [facility]  If you or a family member do not have a primary care provider, use the link below to schedule a visit and establish care. When you choose a [facility] primary care physician or advanced practice provider, you gain a long-term partner in health. Find a Primary Care Provider  Learn more about [facility]'s in-office and virtual care options: [facility]

## 2022-09-02 ENCOUNTER — Ambulatory Visit (HOSPITAL_COMMUNITY)
Admission: RE | Admit: 2022-09-02 | Discharge: 2022-09-02 | Disposition: A | Discharge status: Home / Self Care | Payer: BC Managed Care – PPO | Source: Ambulatory Visit | Attending: Internal Medicine | Admitting: Internal Medicine

## 2022-09-02 DIAGNOSIS — E042 Nontoxic multinodular goiter: Secondary | ICD-10-CM | POA: Insufficient documentation

## 2022-09-02 MED ORDER — SODIUM IODIDE I-123 7.4 MBQ CAPS
449.0000 | ORAL_CAPSULE | Freq: Once | ORAL | Status: AC
  Administered 2022-09-02: 449 via ORAL

## 2022-09-02 NOTE — Telephone Encounter (Signed)
Requested Prescriptions  Pending Prescriptions Disp Refills  . losartan-hydrochlorothiazide (HYZAAR) 50-12.5 MG tablet [Pharmacy Med Name: LOSARTAN-HCTZ 50-12.5 MG TAB] 90 tablet 0    Sig: TAKE 1 TABLET BY MOUTH EVERY DAY     Cardiovascular: ARB + Diuretic Combos Passed - 08/31/2022  6:56 PM      Passed - K in normal range and within 180 days    Potassium  Date Value Ref Range Status  04/16/2022 4.0 3.5 - 5.1 mmol/L Final         Passed - Na in normal range and within 180 days    Sodium  Date Value Ref Range Status  04/16/2022 138 135 - 145 mmol/L Final  07/22/2021 141 134 - 144 mmol/L Final         Passed - Cr in normal range and within 180 days    Creatinine, Ser  Date Value Ref Range Status  04/16/2022 0.83 0.61 - 1.24 mg/dL Final         Passed - eGFR is 10 or above and within 180 days    GFR calc Af Amer  Date Value Ref Range Status  02/19/2019 >60 >60 mL/min Final   GFR, Estimated  Date Value Ref Range Status  04/16/2022 >60 >60 mL/min Final    Comment:    (NOTE) Calculated using the CKD-EPI Creatinine Equation (2021)    eGFR  Date Value Ref Range Status  07/22/2021 93 >59 mL/min/1.73 Final         Passed - Patient is not pregnant      Passed - Last BP in normal range    BP Readings from Last 1 Encounters:  08/17/22 124/70         Passed - Valid encounter within last 6 months    Recent Outpatient Visits          1 month ago Essential (primary) hypertension   Primary Care at [facility], Amy J, NP   5 months ago Essential (primary) hypertension   Primary Care at [facility], Amy J, NP   9 months ago Essential hypertension   [facility], Flonnie Hailstone, NP   11 months ago Annual physical exam   Primary Care at [facility], Amy J, NP   1 year ago Essential hypertension   Primary Care at [facility], Flonnie Hailstone, NP      Future Appointments            In 1 month Camillia Herter, NP  Primary Care at [facility]           . sertraline (ZOLOFT) 25 MG tablet [Pharmacy Med Name: SERTRALINE HCL 25 MG TABLET] 90 tablet 0    Sig: TAKE 1 TABLET (25 MG TOTAL) BY MOUTH DAILY.     Psychiatry:  Antidepressants - SSRI - sertraline Passed - 08/31/2022  6:56 PM      Passed - AST in normal range and within 360 days    AST  Date Value Ref Range Status  01/08/2022 33 15 - 41 U/L Final         Passed - ALT in normal range and within 360 days    ALT  Date Value Ref Range Status  01/08/2022 43 0 - 44 U/L Final         Passed - Completed PHQ-2 or PHQ-9 in the last 360 days      Passed - Valid encounter within last 6 months  Recent Outpatient Visits          1 month ago Essential (primary) hypertension   Primary Care at [facility], Amy J, NP   5 months ago Essential (primary) hypertension   Primary Care at [facility], Amy J, NP   9 months ago Essential hypertension   Primary Care at [facility], Amy J, NP   11 months ago Annual physical exam   Primary Care at [facility], Amy J, NP   1 year ago Essential hypertension   Primary Care at [facility] Flonnie Hailstone, NP      Future Appointments            In 1 month Camillia Herter, NP Primary Care at [facility]

## 2022-09-03 ENCOUNTER — Ambulatory Visit (HOSPITAL_COMMUNITY)
Admission: RE | Admit: 2022-09-03 | Discharge: 2022-09-03 | Disposition: A | Discharge status: Home / Self Care | Payer: BC Managed Care – PPO | Source: Ambulatory Visit | Attending: Internal Medicine | Admitting: Internal Medicine

## 2022-09-04 ENCOUNTER — Encounter: Payer: Self-pay | Admitting: Internal Medicine

## 2022-09-04 DIAGNOSIS — E0520 Thyrotoxicosis with toxic multinodular goiter without thyrotoxic crisis or storm: Secondary | ICD-10-CM

## 2022-09-07 ENCOUNTER — Encounter: Payer: Self-pay | Admitting: Gastroenterology

## 2022-09-07 ENCOUNTER — Telehealth: Payer: Self-pay

## 2022-09-07 ENCOUNTER — Encounter: Payer: Self-pay | Admitting: Internal Medicine

## 2022-09-07 NOTE — Telephone Encounter (Signed)
Recommend trialing Recticare instead. Please see if we have patient coupons available. Can try to see if there is an expedited appt time available for eval, particularly if not improving with Recticare and Sitz baths. Thanks.

## 2022-09-07 NOTE — Telephone Encounter (Signed)
Patient states that Dr. Harlow Asa office doesn't have referral. Can we resend

## 2022-09-10 DIAGNOSIS — D123 Benign neoplasm of transverse colon: Secondary | ICD-10-CM

## 2022-09-10 DIAGNOSIS — K5730 Diverticulosis of large intestine without perforation or abscess without bleeding: Secondary | ICD-10-CM

## 2022-09-10 DIAGNOSIS — K641 Second degree hemorrhoids: Secondary | ICD-10-CM

## 2022-09-11 ENCOUNTER — Encounter: Payer: Self-pay | Admitting: Gastroenterology

## 2022-09-11 ENCOUNTER — Ambulatory Visit (INDEPENDENT_AMBULATORY_CARE_PROVIDER_SITE_OTHER): Payer: BC Managed Care – PPO | Admitting: Gastroenterology

## 2022-09-11 VITALS — BP 110/73 | HR 93 | Ht 69.0 in | Wt 183.5 lb

## 2022-09-11 DIAGNOSIS — K602 Anal fissure, unspecified: Secondary | ICD-10-CM | POA: Diagnosis not present

## 2022-09-11 DIAGNOSIS — K219 Gastro-esophageal reflux disease without esophagitis: Secondary | ICD-10-CM | POA: Diagnosis not present

## 2022-09-11 DIAGNOSIS — K641 Second degree hemorrhoids: Secondary | ICD-10-CM | POA: Diagnosis not present

## 2022-09-11 MED ORDER — AMBULATORY NON FORMULARY MEDICATION: 1 refills | Status: DC

## 2022-09-11 NOTE — Progress Notes (Signed)
Chief Complaint:    Rectal pain  GI History: 66 year old male with index screening colonoscopy in 09/2021 after positive Cologuard which was notable for large polyp as outlined below.  Otherwise was without GI symptoms and no known family history of colon cancer or related malignancies.  - 08/2021: Cologuard positive.  Normal CBC and liver enzymes - 10/17/2021: Colonoscopy: 3 polyps ranging 5-10 mm (path: Tubular adenoma, SSP), 30 mm polyp in proximal transverse colon biopsied and tattooed (path: Tubular adenoma), 8 mm flat transverse polyp (path: SSP), 2 flat polyps ranging 8-12 mm in descending colon (path: HP), 2 subcentimeter sigmoid/descending polyps (path: TA x1, HP x1), 2 small rectal polyps (path: HP).  Sigmoid diverticulosis, internal hemorrhoids.  Normal TI -10/20/122: CEA Normal - 12/31/2021: Colonoscopy: 30 mm transverse colon adenoma removed via EMR en bloc then clipped closed.  Sigmoid diverticulosis.  Internal hemorrhoids.  Repeat in 1 year - 01/08/2022: CT abdomen/pelvis (indication: Acute abdominal pain): Normal liver, cholelithiasis without cholecystitis or duct dilation.  Mild circumferential wall thickening of distal esophagus above the GE junction with adjacent lymph node in the posterior mediastinum measuring 0.6 cm.  Postsurgical changes c/w prior appendectomy - 01/08/2022: RUQ [place]: Cholelithiasis with upper normal GB wall thickness and minimal edema.  No duct dilation.  CBD 6 mm - 01/08/2022: Robotic assisted cholecystectomy - 06/03/2022: Banding of LL hemorrhoid - 06/25/2022: EGD: LA Grade B esophagitis, 4 cm hiatal hernia.  Was started on Protonix 40 mg BID x6 weeks with plan to titrate - 07/08/2022: Banding of RP hemorrhoid - 08/10/2022: Banding of RA hemorrhoid     Endoscopic History: - Colonoscopy (10/17/2021): 3 polyps ranging 5-10 mm (path: Tubular adenoma, SSP), 30 mm polyp in proximal transverse colon biopsied and tattooed (path: Tubular adenoma), 8 mm flat transverse  polyp (path: SSP), 2 flat polyps ranging 8-12 mm in descending colon (path: HP), 2 subcentimeter sigmoid/descending polyps (path: TA x1, HP x1), 2 small rectal polyps (path: HP).  Sigmoid diverticulosis, internal hemorrhoids.  Normal TI - Colonoscopy (12/31/2021): 30 mm transverse colon adenoma removed via EMR en bloc then clipped closed.  Sigmoid diverticulosis.  Internal hemorrhoids.  Repeat in 1 year - EGD (06/2022): LA Grade B esophagitis (path: Reflux changes), 4 cm hiatal hernia, normal stomach and duodenum.  Started on Protonix 40 mg twice daily x6 weeks  HPI:     Patient is a 66 y.o. male presenting to the Gastroenterology Clinic for follow-up.  Was last seen by me on 08/10/2022 with banding of the RA hemorrhoid.  Exam at that time also with external skin tags.  Did well with hemorrhoid banding.  Today, states he had recurrence of painful external hemorrhoid.  No appreciable improvement with Preparation H, OTC RectiCare and sitz bath's. However, sxs have improved since making this appointment and essentially resolved in last 2 days.   Was recently evaluated in the surgical clinic for potential thyroidectomy for multinodular goiter.  Interestingly, he was advised to stop taking his thyroid medication about 2 weeks prior to symptoms onset and subsequently developed constipation.  Review of systems:     No chest pain, no SOB, no fevers, no urinary sx   Past Medical History:  Diagnosis Date   Anxiety    Bilateral inguinal hernia 06/02/2021   GERD (gastroesophageal reflux disease)    History of kidney stones 2020   Hypertension    Sinus complaint 06/02/2021   x 1 week per pt   Wears glasses 06/02/2021    Patient's surgical history, family medical  history, social history, medications and allergies were all reviewed in Epic    Current Outpatient Medications  Medication Sig Dispense Refill   albuterol (VENTOLIN HFA) 108 (90 Base) MCG/ACT inhaler Inhale 2 puffs into the lungs every 6  (six) hours as needed for wheezing or shortness of breath. 8 g 1   atorvastatin (LIPITOR) 40 MG tablet TAKE 1 TABLET BY MOUTH EVERY DAY 90 tablet 0   BREO ELLIPTA 100-25 MCG/ACT AEPB Inhale 1 puff into the lungs daily. 60 each 5   fluticasone (FLONASE) 50 MCG/ACT nasal spray Place 2 sprays into both nostrils daily. (Patient taking differently: Place 2 sprays into both nostrils daily as needed for allergies or rhinitis.) 16 g 0   losartan-hydrochlorothiazide (HYZAAR) 50-12.5 MG tablet TAKE 1 TABLET BY MOUTH EVERY DAY 90 tablet 0   methimazole (TAPAZOLE) 5 MG tablet Take 0.5 tablets (2.5 mg total) by mouth 3 (three) times a week. (Patient taking differently: Take 2.5 mg by mouth 3 (three) times a week. Every other day) 20 tablet 1   pantoprazole (PROTONIX) 40 MG tablet Take 1 tablet (40 mg total) by mouth 2 (two) times daily. Use '40mg'$  BID for 6 weeks, then reduce to '40mg'$  daily 60 tablet 3   sertraline (ZOLOFT) 25 MG tablet TAKE 1 TABLET (25 MG TOTAL) BY MOUTH DAILY. 90 tablet 0   triamcinolone cream (KENALOG) 0.1 % Apply 1 Application topically 2 (two) times daily. 30 g 0   No current facility-administered medications for this visit.    Physical Exam:     BP 110/73   Pulse 93   Ht '5\' 9"'$  (1.753 m)   Wt 183 lb 8 oz (83.2 kg)   SpO2 98%   BMI 27.10 kg/m   GENERAL:  Pleasant male in NAD PSYCH: : Cooperative, normal affect Musculoskeletal:  Normal muscle tone, normal strength NEURO: Alert and oriented x 3, no focal neurologic deficits Rectal exam: Sensation intact and preserved anal wink.  External anal fissure with exquisite TTP and adjacent skin tag.  Too tender to perform a anoscopy.  (Chaperone: Renee Rival, CMA).     IMPRESSION and PLAN:    1) Anal Fissure: Physical exam notable for external anal fissure in the 3:00 position.  Suspect fissure developed as a result of constipation this thyroid medication for testing.  Will treat as below:  - Start topical NTG 0.125%, apply a small,  pea-sized amount to the affected area BID for 6-8 weeks.  - We discussed the ADR of headache, and if patient does experience, to call me and will change and make pharmacy request to compound topical CCB (topical nifedipine 0.2-0.3% applied 2-4 times daily; unfortunately, the CCB also carries an ADR of headache in 5-12%). Additionally, cautioned to avoid strenuous activity within 30 minutes of application  - Continue fiber supplement for a goal of regular, soft, stools without straining to have a bowel movement.  - Start  1/2 cap MiraLAX daily and titrate to soft stools without straining to have BM.   - Sitz bath with warm water for 10-15 minutes 2-3 times daily; directed to [employer] available online or at [employer]; ensure to dry area afterwards  2) Internal hemorrhoids - Completed hemorrhoid banding series  3) GERD with erosive esophagitis - Continue Protonix 40 mg daily - Ok to use Tums or other OTC antacid for breakthrough - If having more frequent/regular breakthrough, may need to go back up to high-dose PPI  - Continue antireflux lifestyle/dietary modifications with avoidance  of exacerbating foods  4) abnormal imaging study 5) mild persistent asthma 6) former smoker CT in January with incidentally noted mild circumferential wall thickening of the distal esophagus with adjacent 0.6 cm lymph node.  Circumferential wall thickening certainly due to GERD with erosive esophagitis as above.  Small lymph node could be reactive inflammatory node from either the esophagitis or bronchitis he was dealing with at that time. - Completed high-dose PPI therapy as above - Plan to enroll in lung cancer screening program (former smoker) with dedicated CT chest.  This should be able to reevaluate the lymph node in question.  Follow-up with Pulmonary Clinic as   7) Multinodular goiter - Planning thyroidectomy - Management per surgical service and PCM      RTC prn  Lavena Bullion  ,DO, FACG 09/11/2022, 1:31 PM

## 2022-09-11 NOTE — Patient Instructions (Addendum)
If you are age 64 or older, your body mass index should be between 23-30. Your Body mass index is 27.1 kg/m. If this is out of the aforementioned range listed, please consider follow up with your Primary Care Provider.  If you are age 80 or younger, your body mass index should be between 19-25. Your Body mass index is 27.1 kg/m. If this is out of the aformentioned range listed, please consider follow up with your Primary Care Provider.   __________________________________________________________  The [place] [facility] providers would like to encourage you to use [facility] to communicate with providers for non-urgent requests or questions.  Due to long hold times on the telephone, sending your provider a message by [facility] may be a faster and more efficient way to get a response.  Please allow 48 business hours for a response.  Please remember that this is for non-urgent requests.   Due to recent changes in healthcare laws, you may see the results of your imaging and laboratory studies on MyChart before your provider has had a chance to review them.  We understand that in some cases there may be results that are confusing or concerning to you. Not all laboratory results come back in the same time frame and the provider may be waiting for multiple results in order to interpret others.  Please give [place] 48 hours in order for your provider to thoroughly review all the results before contacting the office for clarification of your results.   We have sent a prescription for nitroglycerin 0.125% gel to [facility]. You should apply a pea size amount to your rectum three times daily x 6-8 weeks.  [facility]'s information is below: Address: [address], [place], [place] [address]  [phone]  *Please DO NOT go directly from our office to pick up this medication! Give the pharmacy 1 day to process the prescription as this is compounded and takes time to make.   Take Miralax one half  capful daily.  Thank you for choosing me and [facility].  Vito Cirigliano, D.O.

## 2022-09-16 ENCOUNTER — Ambulatory Visit (INDEPENDENT_AMBULATORY_CARE_PROVIDER_SITE_OTHER): Payer: BC Managed Care – PPO | Admitting: Acute Care

## 2022-09-16 ENCOUNTER — Encounter: Payer: Self-pay | Admitting: Acute Care

## 2022-09-16 DIAGNOSIS — Z87891 Personal history of nicotine dependence: Secondary | ICD-10-CM | POA: Diagnosis not present

## 2022-09-16 NOTE — Progress Notes (Signed)
Virtual Visit via Telephone Note  I connected with Gregg [name] on 09/16/22 at  9:30 AM EDT by telephone and verified that I am speaking with the correct person using two identifiers.  Location: Patient:  At home Provider: [place], [place], [place], [address]    I discussed the limitations, risks, security and privacy concerns of performing an evaluation and management service by telephone and the availability of in person appointments. I also discussed with the patient that there may be a patient responsible charge related to this service. The patient expressed understanding and agreed to proceed.    Shared Decision Making Visit Lung Cancer Screening Program [phone])   Eligibility: Age 66 y.o. Pack Years Smoking History Calculation 21 pack year smoking history (# packs/per year x # years smoked) Recent History of coughing up blood  no Unexplained weight loss? no ( >Than 15 pounds within the last 6 months ) Prior History Lung / other cancer no (Diagnosis within the last 5 years already requiring surveillance chest CT Scans). Smoking Status Former Smoker Former Smokers: Years since quit: 5 years  Quit Date: 03/29/2017  Visit Components: Discussion included one or more decision making aids. yes Discussion included risk/benefits of screening. yes Discussion included potential follow up diagnostic testing for abnormal scans. yes Discussion included meaning and risk of over diagnosis. yes Discussion included meaning and risk of False Positives. yes Discussion included meaning of total radiation exposure. yes  Counseling Included: Importance of adherence to annual lung cancer LDCT screening. yes Impact of comorbidities on ability to participate in the program. yes Ability and willingness to under diagnostic treatment. yes  Smoking Cessation Counseling: Current Smokers:  Discussed importance of smoking cessation. yes Information about tobacco cessation classes  and interventions provided to patient. yes Patient provided with "ticket" for LDCT Scan. yes Symptomatic Patient. no  Counseling NA Diagnosis Code: Tobacco Use Z72.0 Asymptomatic Patient yes  Counseling (Intermediate counseling: > three minutes counseling) K5993 Former Smokers:  Discussed the importance of maintaining cigarette abstinence. yes Diagnosis Code: Personal History of Nicotine Dependence. T70.177 Information about tobacco cessation classes and interventions provided to patient. Yes Patient provided with "ticket" for LDCT Scan. yes Written Order for Lung Cancer Screening with LDCT placed in Epic. Yes (CT Chest Lung Cancer Screening Low Dose W/O CM) [id number] Z12.2-Screening of respiratory organs Z87.891-Personal history of nicotine dependence  I spent 25 minutes of face to face time/virtual visit time  with  Gregg [name] discussing the risks and benefits of lung cancer screening. We took the time to pause the power point at intervals to allow for questions to be asked and answered to ensure understanding. We discussed that he had taken the single most powerful action possible to decrease his risk of developing lung cancer when he quit smoking. I counseled him to remain smoke free, and to contact me if he ever had the desire to smoke again so that I can provide resources and tools to help support the effort to remain smoke free. We discussed the time and location of the scan, and that either  Doroteo Glassman RN, Joella Prince, RN or I  or I will call / send a letter with the results within  24-72 hours of receiving them. He has the office contact information in the event he needs to speak with me,  he verbalized understanding of all of the above and had no further questions upon leaving the office.     I explained to the patient that there  has been a high incidence of coronary artery disease noted on these exams. I explained that this is a non-gated exam therefore degree or severity cannot be  determined. This patient is on statin therapy. I have asked the patient to follow-up with their PCP regarding any incidental finding of coronary artery disease and management with diet or medication as they feel is clinically indicated. The patient verbalized understanding of the above and had no further questions.     Magdalen Spatz, NP 09/16/2022

## 2022-09-16 NOTE — Patient Instructions (Signed)
Thank you for participating in the  [facility] Lung Cancer Screening Program. It was our pleasure to meet you today. We will call you with the results of your scan within the next few days. Your scan will be assigned a Lung RADS category score by the physicians reading the scans.  This Lung RADS score determines follow up scanning.  See below for description of categories, and follow up screening recommendations. We will be in touch to schedule your follow up screening annually or based on recommendations of our providers. We will fax a copy of your scan results to your Primary Care Physician, or the physician who referred you to the program, to ensure they have the results. Please call the office if you have any questions or concerns regarding your scanning experience or results.  Our office number is [phone]. Please speak with Denise Phelps, RN. , or  Denise Buckner RN, They are  our Lung Cancer Screening RN.'s If They are unavailable when you call, Please leave a message on the voice mail. We will return your call at our earliest convenience.This voice mail is monitored several times a day.  Remember, if your scan is normal, we will scan you annually as long as you continue to meet the criteria for the program. (Age 55-77, Current smoker or smoker who has quit within the last 15 years). If you are a smoker, remember, quitting is the single most powerful action that you can take to decrease your risk of lung cancer and other pulmonary, breathing related problems. We know quitting is hard, and we are here to help.  Please let us know if there is anything we can do to help you meet your goal of quitting. If you are a former smoker, congratulations. We are proud of you! Remain smoke free! Remember you can refer friends or family members through the number above.  We will screen them to make sure they meet criteria for the program. Thank you for helping us take better care of you by  participating in Lung Screening.  You can receive free nicotine replacement therapy ( patches, gum or mints) by calling 1-800-QUIT NOW. Please call so we can get you on the path to becoming  a non-smoker. I know it is hard, but you can do this!  Lung RADS Categories:  Lung RADS 1: no nodules or definitely non-concerning nodules.  Recommendation is for a repeat annual scan in 12 months.  Lung RADS 2:  nodules that are non-concerning in appearance and behavior with a very low likelihood of becoming an active cancer. Recommendation is for a repeat annual scan in 12 months.  Lung RADS 3: nodules that are probably non-concerning , includes nodules with a low likelihood of becoming an active cancer.  Recommendation is for a 6-month repeat screening scan. Often noted after an upper respiratory illness. We will be in touch to make sure you have no questions, and to schedule your 6-month scan.  Lung RADS 4 A: nodules with concerning findings, recommendation is most often for a follow up scan in 3 months or additional testing based on our provider's assessment of the scan. We will be in touch to make sure you have no questions and to schedule the recommended 3 month follow up scan.  Lung RADS 4 B:  indicates findings that are concerning. We will be in touch with you to schedule additional diagnostic testing based on our provider's  assessment of the scan.  Other options for assistance in smoking cessation (   As covered by your insurance benefits)  Hypnosis for smoking cessation  [employer] [phone]  Acupuncture for smoking cessation  [facility] [phone]   

## 2022-09-17 ENCOUNTER — Ambulatory Visit
Admission: RE | Admit: 2022-09-17 | Discharge: 2022-09-17 | Disposition: A | Discharge status: Home / Self Care | Payer: BC Managed Care – PPO | Source: Ambulatory Visit | Attending: Family | Admitting: Family

## 2022-09-17 DIAGNOSIS — Z87891 Personal history of nicotine dependence: Secondary | ICD-10-CM

## 2022-09-17 DIAGNOSIS — Z122 Encounter for screening for malignant neoplasm of respiratory organs: Secondary | ICD-10-CM

## 2022-09-17 NOTE — Progress Notes (Signed)
Sent message, via epic in basket, requesting orders in epic from surgeon.  

## 2022-09-21 ENCOUNTER — Other Ambulatory Visit: Payer: Self-pay

## 2022-09-21 DIAGNOSIS — Z87891 Personal history of nicotine dependence: Secondary | ICD-10-CM

## 2022-09-21 DIAGNOSIS — Z122 Encounter for screening for malignant neoplasm of respiratory organs: Secondary | ICD-10-CM

## 2022-09-22 ENCOUNTER — Ambulatory Visit: Payer: Self-pay | Admitting: Surgery

## 2022-09-22 NOTE — Progress Notes (Signed)
Sent message, via epic in basket, requesting orders in epic from surgeon.  

## 2022-09-23 ENCOUNTER — Other Ambulatory Visit: Payer: Self-pay | Admitting: Pulmonary Disease

## 2022-09-23 ENCOUNTER — Other Ambulatory Visit: Payer: Self-pay | Admitting: Family

## 2022-09-23 DIAGNOSIS — E785 Hyperlipidemia, unspecified: Secondary | ICD-10-CM

## 2022-09-23 NOTE — Patient Instructions (Signed)
DUE TO COVID-19 ONLY TWO VISITORS  (aged 64 and older)  ARE ALLOWED TO COME WITH YOU AND STAY IN THE WAITING ROOM ONLY DURING PRE OP AND PROCEDURE.   **NO VISITORS ARE ALLOWED IN THE SHORT STAY AREA OR RECOVERY ROOM!!**  IF YOU WILL BE ADMITTED INTO THE HOSPITAL YOU ARE ALLOWED ONLY FOUR SUPPORT PEOPLE DURING VISITATION HOURS ONLY (7 AM -8PM)   The support person(s) must pass our screening, gel in and out, and wear a mask at all times, including in the patient's room. Patients must also wear a mask when staff or their support person are in the room. Visitors GUEST BADGE MUST BE WORN VISIBLY  One adult visitor may remain with you overnight and MUST be in the room by 8 P.M.     Your procedure is scheduled on: 10/02/22   Report to [facility] Main Entrance    Report to admitting at : 8:45 AM   Call this number if you have problems the morning of surgery [phone]   Do not eat food :After Midnight.   After Midnight you may have the following liquids until : 8:00 AM DAY OF SURGERY  Water Black Coffee (sugar ok, NO MILK/CREAM OR CREAMERS)  Tea (sugar ok, NO MILK/CREAM OR CREAMERS) regular and decaf                             Plain Jell-O (NO RED)                                           Fruit ices (not with fruit pulp, NO RED)                                     Popsicles (NO RED)                                                                  Juice: apple, WHITE grape, WHITE cranberry Sports drinks like Gatorade (NO RED)              Oral Hygiene is also important to reduce your risk of infection.                                    Remember - BRUSH YOUR TEETH THE MORNING OF SURGERY WITH YOUR REGULAR TOOTHPASTE   Do NOT smoke after Midnight   Take these medicines the morning of surgery with A SIP OF WATER: sertraline,pantoprazole.Use inhalers and Flonase as usual.  DO NOT TAKE ANY ORAL DIABETIC MEDICATIONS DAY OF YOUR SURGERY                              You may not  have any metal on your body including hair pins, jewelry, and body piercing             Do not wear lotions, powders, perfumes/cologne, or deodorant  Men may shave face and neck.   Do not bring valuables to the hospital. [place].   Contacts, dentures or bridgework may not be worn into surgery.   Bring small overnight bag day of surgery.   DO NOT [place]. PHARMACY WILL DISPENSE MEDICATIONS LISTED ON YOUR MEDICATION LIST TO YOU DURING YOUR ADMISSION [place]!    Patients discharged on the day of surgery will not be allowed to drive home.  Someone NEEDS to stay with you for the first 24 hours after anesthesia.   Special Instructions: Bring a copy of your healthcare power of attorney and living will documents         the day of surgery if you haven't scanned them before.              Please read over the following fact sheets you were given: IF YOU HAVE QUESTIONS ABOUT YOUR PRE-OP INSTRUCTIONS PLEASE CALL [phone]     [facility] - Preparing for Surgery Before surgery, you can play an important role.  Because skin is not sterile, your skin needs to be as free of germs as possible.  You can reduce the number of germs on your skin by washing with CHG (chlorahexidine gluconate) soap before surgery.  CHG is an antiseptic cleaner which kills germs and bonds with the skin to continue killing germs even after washing. Please DO NOT use if you have an allergy to CHG or antibacterial soaps.  If your skin becomes reddened/irritated stop using the CHG and inform your nurse when you arrive at Short Stay. Do not shave (including legs and underarms) for at least 48 hours prior to the first CHG shower.  You may shave your face/neck. Please follow these instructions carefully:  1.  Shower with CHG Soap the night before surgery and the  morning of Surgery.  2.  If you choose to wash your hair, wash  your hair first as usual with your  normal  shampoo.  3.  After you shampoo, rinse your hair and body thoroughly to remove the  shampoo.                           4.  Use CHG as you would any other liquid soap.  You can apply chg directly  to the skin and wash                       Gently with a scrungie or clean washcloth.  5.  Apply the CHG Soap to your body ONLY FROM THE NECK DOWN.   Do not use on face/ open                           Wound or open sores. Avoid contact with eyes, ears mouth and genitals (private parts).                       Wash face,  Genitals (private parts) with your normal soap.             6.  Wash thoroughly, paying special attention to the area where your surgery  will be performed.  7.  Thoroughly rinse your body with warm water from the neck down.  8.  DO NOT shower/wash with your normal soap after using and rinsing off  the CHG Soap.                9.  Pat yourself dry with a clean towel.            10.  Wear clean pajamas.            11.  Place clean sheets on your bed the night of your first shower and do not  sleep with pets. Day of Surgery : Do not apply any lotions/deodorants the morning of surgery.  Please wear clean clothes to the hospital/surgery center.  FAILURE TO FOLLOW THESE INSTRUCTIONS MAY RESULT IN THE CANCELLATION OF YOUR SURGERY PATIENT SIGNATURE_________________________________  NURSE SIGNATURE__________________________________  ________________________________________________________________________

## 2022-09-24 ENCOUNTER — Encounter (HOSPITAL_COMMUNITY)
Admission: RE | Admit: 2022-09-24 | Discharge: 2022-09-24 | Disposition: A | Discharge status: Home / Self Care | Payer: BC Managed Care – PPO | Source: Ambulatory Visit | Attending: Surgery | Admitting: Surgery

## 2022-09-24 ENCOUNTER — Other Ambulatory Visit: Payer: Self-pay

## 2022-09-24 ENCOUNTER — Encounter: Payer: Self-pay | Admitting: Pulmonary Disease

## 2022-09-24 ENCOUNTER — Encounter (HOSPITAL_COMMUNITY): Payer: Self-pay

## 2022-09-24 VITALS — BP 134/75 | HR 87 | Temp 98.7°F | Ht 69.0 in | Wt 181.0 lb

## 2022-09-24 DIAGNOSIS — Z01812 Encounter for preprocedural laboratory examination: Secondary | ICD-10-CM | POA: Insufficient documentation

## 2022-09-24 DIAGNOSIS — I10 Essential (primary) hypertension: Secondary | ICD-10-CM | POA: Insufficient documentation

## 2022-09-24 HISTORY — DX: Unspecified asthma, uncomplicated: J45.909

## 2022-09-24 LAB — CBC
HCT: 48.9 % (ref 39.0–52.0)
Hemoglobin: 16.5 g/dL (ref 13.0–17.0)
MCH: 31.8 pg (ref 26.0–34.0)
MCHC: 33.7 g/dL (ref 30.0–36.0)
MCV: 94.2 fL (ref 80.0–100.0)
Platelets: 247 10*3/uL (ref 150–400)
RBC: 5.19 MIL/uL (ref 4.22–5.81)
RDW: 12.2 % (ref 11.5–15.5)
WBC: 7.2 10*3/uL (ref 4.0–10.5)
nRBC: 0 % (ref 0.0–0.2)

## 2022-09-24 LAB — BASIC METABOLIC PANEL
Anion gap: 9 (ref 5–15)
BUN: 19 mg/dL (ref 8–23)
CO2: 26 mmol/L (ref 22–32)
Calcium: 9.9 mg/dL (ref 8.9–10.3)
Chloride: 103 mmol/L (ref 98–111)
Creatinine, Ser: 0.95 mg/dL (ref 0.61–1.24)
GFR, Estimated: >60 mL/min (ref >60)
Glucose, Bld: 94 mg/dL (ref 70–99)
Potassium: 3.7 mmol/L (ref 3.5–5.1)
Sodium: 138 mmol/L (ref 135–145)

## 2022-09-24 MED ORDER — BREO ELLIPTA 100-25 MCG/ACT IN AEPB: 1.0000 | INHALATION_SPRAY | Freq: Every day | RESPIRATORY_TRACT | 3 refills | Status: DC

## 2022-09-24 NOTE — Telephone Encounter (Signed)
Requested Prescriptions  Pending Prescriptions Disp Refills  . atorvastatin (LIPITOR) 40 MG tablet [Pharmacy Med Name: ATORVASTATIN 40 MG TABLET] 90 tablet 0    Sig: TAKE 1 TABLET BY MOUTH EVERY DAY     Cardiovascular:  Antilipid - Statins Failed - 09/23/2022  4:14 PM      Failed - Lipid Panel in normal range within the last 12 months    Cholesterol, Total  Date Value Ref Range Status  10/21/2021 138 100 - 199 mg/dL Final   LDL Chol Calc (NIH)  Date Value Ref Range Status  10/21/2021 80 0 - 99 mg/dL Final   HDL  Date Value Ref Range Status  10/21/2021 33 (L) >39 mg/dL Final   Triglycerides  Date Value Ref Range Status  10/21/2021 139 0 - 149 mg/dL Final         Passed - Patient is not pregnant      Passed - Valid encounter within last 12 months    Recent Outpatient Visits          2 months ago Essential (primary) hypertension   Primary Care at [facility], Amy J, NP   6 months ago Essential (primary) hypertension   Primary Care at [facility], Amy J, NP   10 months ago Essential hypertension   Primary Care at [facility] [place], NP   1 year ago Annual physical exam   Primary Care at [facility] [place], NP   1 year ago Essential hypertension   Primary Care at [facility], Flonnie Hailstone, NP      Future Appointments            In 3 weeks Camillia Herter, NP Primary Care at [facility]

## 2022-09-24 NOTE — Progress Notes (Signed)
For Short Stay: [place] appointment date: Date of COVID positive in last 58 days:  Bowel Prep reminder:   For Anesthesia: PCP - NP: Durene Fruits Cardiologist - Dr. Phineas Inches  Chest x-ray - 04/16/22 EKG - 04/17/22 Stress Test -  ECHO -  Cardiac Cath -  Pacemaker/ICD device last checked: Pacemaker orders received: Device Rep notified:  Spinal Cord Stimulator:  Sleep Study -  CPAP -   Fasting Blood Sugar -  Checks Blood Sugar _____ times a day Date and result of last Hgb A1c-  Blood Thinner Instructions: Aspirin Instructions: Last Dose:  Activity level: Can go up a flight of stairs and activities of daily living without stopping and without chest pain and/or shortness of breath   Able to exercise without chest pain and/or shortness of breath   Unable to go up a flight of stairs without chest pain and/or shortness of breath     Anesthesia review: Hx: HTN,Rt. BBB  Patient denies shortness of breath, fever, cough and chest pain at PAT appointment   Patient verbalized understanding of instructions that were given to them at the PAT appointment. Patient was also instructed that they will need to review over the PAT instructions again at home before surgery.

## 2022-09-25 ENCOUNTER — Other Ambulatory Visit: Payer: Self-pay

## 2022-09-25 DIAGNOSIS — K2100 Gastro-esophageal reflux disease with esophagitis, without bleeding: Secondary | ICD-10-CM

## 2022-09-25 DIAGNOSIS — R9389 Abnormal findings on diagnostic imaging of other specified body structures: Secondary | ICD-10-CM

## 2022-09-25 MED ORDER — PANTOPRAZOLE SODIUM 40 MG PO TBEC: 40.0000 mg | DELAYED_RELEASE_TABLET | Freq: Every day | ORAL | 3 refills | Status: DC

## 2022-09-28 ENCOUNTER — Encounter (HOSPITAL_COMMUNITY): Payer: Self-pay | Admitting: Surgery

## 2022-09-28 NOTE — H&P (Signed)
PROVIDER: Trypp Heckmann Charlotta Newton, MD   Chief Complaint: New Consultation (Multinodular goiter, history of hyperthyroidism)  History of Present Illness:  Patient is referred by Dr. 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 Patient is known to my practice having undergone previous appendectomy, cholecystectomy, and hernia repair by my partners. Patient was recently seen by Dr. Kaylyn Lim and referred over to me for thyroidectomy. Patient has approximately a 1 year history of multinodular thyroid goiter with hyperthyroidism. He was seen in December 2022 by Dr. Renato Shin and started on methimazole for hyperthyroidism. Has developed some mild compressive symptoms with intermittent hoarseness. His recent TSH level was normal at 1.13. Patient did have an ultrasound performed at the request of Dr. Loanne Drilling on December 18, 2021. This showed multiple bilateral thyroid nodules. Fine-needle aspiration biopsy was recommended on 4 of the nodules. Does have a family history of thyroid cancer in his father who died in 61. He also has a sister who underwent thyroid surgery and his mother was on thyroid medication. Patient presents today to discuss proceeding with thyroidectomy in lieu of undergoing fine-needle aspiration biopsy. Patient is retired from [employer]. 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  Review of Systems: A complete review of systems was obtained from the patient. I have reviewed this information and discussed as appropriate with the patient. See HPI as well for other ROS.  ROS   Medical History: Past Medical History:  Diagnosis Date  Anxiety   Patient Active Problem List  Diagnosis  History of appendicitis  Bilateral inguinal hernia (BIH)  Acute phlegmonous appendicitis  Kidney stone  Adenomatous polyp of transverse colon  Anxiety and  depression  Diverticulosis of colon without hemorrhage  Essential hypertension  Former smoker  Grade II internal hemorrhoids  Hyperlipidemia  Hyperthyroidism  Mild persistent asthma  Multiple thyroid nodules  S/P laparoscopic cholecystectomy  Symptomatic cholelithiasis  Enlarged thyroid gland   Past Surgical History:  Procedure Laterality Date  ROBOT ASSISTED LAPAROSCOPIC CHOLECYSTECTOMY N/A 12/2021  APPENDECTOMY  HERNIA REPAIR  SPINE SURGERY    No Known Allergies  Current Outpatient Medications on File Prior to Visit  Medication Sig Dispense Refill  nitroglycerin 0.4 % (w/w) Oint Apply a pea sized amount to the affected area twice daily for 6 weeks   No current facility-administered medications on file prior to visit.   Family History  Problem Relation Age of Onset  Breast cancer Mother  Myocardial Infarction (Heart attack) Father  Diabetes Sister  High blood pressure (Hypertension) Sister  Obesity Sister  Skin cancer Sister    Social History   Tobacco Use  Smoking Status Former  Types: Cigarettes  Quit date: 03/29/2017  Years since quitting: 5.4  Smokeless Tobacco Never    Social History   Socioeconomic History  Marital status: Single  Tobacco Use  Smoking status: Former  Types: Cigarettes  Quit date: 03/29/2017  Years since quitting: 5.4  Smokeless tobacco: Never  Vaping Use  Vaping Use: Never used  Substance and Sexual Activity  Alcohol use: Never  Drug use: Never   Objective:   There were no vitals filed for this visit.  There is no height or weight on file to calculate BMI.  Physical Exam   GENERAL APPEARANCE Comfortable, no acute issues Development: normal Gross deformities: none  SKIN Rash, lesions, ulcers: none Induration, erythema: none Nodules: none palpable  EYES Conjunctiva and  lids: normal Pupils: equal and reactive  EARS, NOSE, MOUTH, THROAT External ears: no lesion or deformity External nose: no lesion or  deformity Hearing: grossly normal  NECK Symmetric: yes Trachea: midline Thyroid: Enlarged thyroid gland which is relatively firm and nodular without discrete or dominant mass. There is no associated lymphadenopathy.  CHEST Respiratory effort: normal Retraction or accessory muscle use: no Breath sounds: normal bilaterally Rales, rhonchi, wheeze: none  CARDIOVASCULAR Auscultation: regular rhythm, normal rate Murmurs: none Pulses: radial pulse 2+ palpable Lower extremity edema: none  ABDOMEN Not assessed  GENITOURINARY/RECTAL Not assessed  MUSCULOSKELETAL Station and gait: normal Digits and nails: no clubbing or cyanosis Muscle strength: grossly normal all extremities Range of motion: grossly normal all extremities Deformity: none  LYMPHATIC Cervical: none palpable Supraclavicular: none palpable  PSYCHIATRIC Oriented to person, place, and time: yes Mood and affect: normal for situation Judgment and insight: appropriate for situation   Assessment and Plan:   Hyperthyroidism  Multiple thyroid nodules  Enlarged thyroid gland  Patient is referred by endocrinology for thyroidectomy for management of toxic multinodular thyroid goiter. Patient is provided with written literature to review at home.  Patient provided with a copy of "The Thyroid Book: Medical and Surgical Treatment of Thyroid Problems", published by [employer], 16 pages. Book reviewed and explained to patient during visit today.  Today we discussed proceeding with total thyroidectomy. Patient has discussed this previously with his endocrinologist and with my partner, Dr. Kaylyn Lim. We discussed the risk and benefits of the procedure. We discussed the potential risk of recurrent laryngeal nerve injury with alteration of voice quality and we discussed potential injury to parathyroid glands affecting his calcium levels. We discussed the size and location of the surgical incision. We discussed the hospital stay to  be anticipated. We discussed his postoperative recovery and return to work and activities. We discussed the need for lifelong thyroid hormone replacement therapy. The patient understands and agrees to proceed with surgery in the near future.  Armandina Gemma, MD [facility] [place] [facility] Office: [phone]

## 2022-10-02 ENCOUNTER — Ambulatory Visit (HOSPITAL_COMMUNITY): Payer: BC Managed Care – PPO | Admitting: Physician Assistant

## 2022-10-02 ENCOUNTER — Inpatient Hospital Stay (HOSPITAL_COMMUNITY)
Admission: AD | Admit: 2022-10-02 | Discharge: 2022-10-05 | DRG: 627 | Disposition: A | Discharge status: Home / Self Care | Payer: BC Managed Care – PPO | Attending: Surgery | Admitting: Surgery

## 2022-10-02 ENCOUNTER — Encounter (HOSPITAL_COMMUNITY): Payer: Self-pay | Admitting: Surgery

## 2022-10-02 ENCOUNTER — Other Ambulatory Visit: Payer: Self-pay

## 2022-10-02 ENCOUNTER — Ambulatory Visit (HOSPITAL_COMMUNITY): Payer: BC Managed Care – PPO | Admitting: Certified Registered Nurse Anesthetist

## 2022-10-02 ENCOUNTER — Encounter (HOSPITAL_COMMUNITY)
Admission: AD | Disposition: A | Discharge status: Home / Self Care | Payer: Self-pay | Source: Home / Self Care | Attending: Surgery

## 2022-10-02 DIAGNOSIS — Z87891 Personal history of nicotine dependence: Secondary | ICD-10-CM

## 2022-10-02 DIAGNOSIS — J4530 Mild persistent asthma, uncomplicated: Secondary | ICD-10-CM | POA: Diagnosis present

## 2022-10-02 DIAGNOSIS — E042 Nontoxic multinodular goiter: Secondary | ICD-10-CM | POA: Diagnosis present

## 2022-10-02 DIAGNOSIS — E0520 Thyrotoxicosis with toxic multinodular goiter without thyrotoxic crisis or storm: Principal | ICD-10-CM | POA: Diagnosis present

## 2022-10-02 DIAGNOSIS — E785 Hyperlipidemia, unspecified: Secondary | ICD-10-CM | POA: Diagnosis present

## 2022-10-02 DIAGNOSIS — K219 Gastro-esophageal reflux disease without esophagitis: Secondary | ICD-10-CM | POA: Diagnosis present

## 2022-10-02 DIAGNOSIS — F419 Anxiety disorder, unspecified: Secondary | ICD-10-CM | POA: Diagnosis present

## 2022-10-02 DIAGNOSIS — F32A Depression, unspecified: Secondary | ICD-10-CM | POA: Diagnosis present

## 2022-10-02 DIAGNOSIS — Z8719 Personal history of other diseases of the digestive system: Secondary | ICD-10-CM

## 2022-10-02 DIAGNOSIS — E049 Nontoxic goiter, unspecified: Secondary | ICD-10-CM | POA: Diagnosis present

## 2022-10-02 DIAGNOSIS — Z833 Family history of diabetes mellitus: Secondary | ICD-10-CM

## 2022-10-02 DIAGNOSIS — Z808 Family history of malignant neoplasm of other organs or systems: Secondary | ICD-10-CM

## 2022-10-02 DIAGNOSIS — I10 Essential (primary) hypertension: Secondary | ICD-10-CM | POA: Diagnosis present

## 2022-10-02 DIAGNOSIS — E0590 Thyrotoxicosis, unspecified without thyrotoxic crisis or storm: Principal | ICD-10-CM

## 2022-10-02 DIAGNOSIS — Z9049 Acquired absence of other specified parts of digestive tract: Secondary | ICD-10-CM

## 2022-10-02 DIAGNOSIS — Z8249 Family history of ischemic heart disease and other diseases of the circulatory system: Secondary | ICD-10-CM

## 2022-10-02 HISTORY — PX: THYROIDECTOMY: SHX17

## 2022-10-02 SURGERY — THYROIDECTOMY
Anesthesia: General | Site: Neck

## 2022-10-02 MED ORDER — FENTANYL CITRATE (PF) 100 MCG/2ML IJ SOLN
INTRAMUSCULAR | Status: AC
  Filled 2022-10-02: qty 2

## 2022-10-02 MED ORDER — ACETAMINOPHEN 325 MG PO TABS: 325.0000 mg | ORAL_TABLET | ORAL | Status: DC | PRN

## 2022-10-02 MED ORDER — ONDANSETRON 4 MG PO TBDP
4.0000 mg | ORAL_TABLET | Freq: Four times a day (QID) | ORAL | Status: DC | PRN
  Administered 2022-10-04: 4 mg via ORAL
  Filled 2022-10-02: qty 1

## 2022-10-02 MED ORDER — LEVOTHYROXINE SODIUM 100 MCG PO TABS: 100.0000 ug | ORAL_TABLET | Freq: Every day | ORAL | 3 refills | Status: DC

## 2022-10-02 MED ORDER — ROCURONIUM BROMIDE 10 MG/ML (PF) SYRINGE
PREFILLED_SYRINGE | INTRAVENOUS | Status: AC
  Filled 2022-10-02: qty 10

## 2022-10-02 MED ORDER — FLUTICASONE FUROATE-VILANTEROL 100-25 MCG/ACT IN AEPB
1.0000 | INHALATION_SPRAY | Freq: Every day | RESPIRATORY_TRACT | Status: DC
  Administered 2022-10-04: 1 via RESPIRATORY_TRACT

## 2022-10-02 MED ORDER — FENTANYL CITRATE PF 50 MCG/ML IJ SOSY
PREFILLED_SYRINGE | INTRAMUSCULAR | Status: AC
  Filled 2022-10-02: qty 2

## 2022-10-02 MED ORDER — EPHEDRINE SULFATE-NACL 50-0.9 MG/10ML-% IV SOSY
PREFILLED_SYRINGE | INTRAVENOUS | Status: DC | PRN
  Administered 2022-10-02: 7.5 mg via INTRAVENOUS

## 2022-10-02 MED ORDER — SODIUM CHLORIDE 0.45 % IV SOLN: INTRAVENOUS | Status: DC

## 2022-10-02 MED ORDER — SERTRALINE HCL 25 MG PO TABS
25.0000 mg | ORAL_TABLET | Freq: Every day | ORAL | Status: DC
  Administered 2022-10-04 – 2022-10-05 (×2): 25 mg via ORAL
  Filled 2022-10-02 (×2): qty 1

## 2022-10-02 MED ORDER — CHLORHEXIDINE GLUCONATE CLOTH 2 % EX PADS: 6.0000 | MEDICATED_PAD | Freq: Once | CUTANEOUS | Status: DC

## 2022-10-02 MED ORDER — OXYCODONE HCL 5 MG/5ML PO SOLN: 5.0000 mg | Freq: Once | ORAL | Status: DC | PRN

## 2022-10-02 MED ORDER — DEXAMETHASONE SODIUM PHOSPHATE 10 MG/ML IJ SOLN
INTRAMUSCULAR | Status: DC | PRN
  Administered 2022-10-02: 5 mg via INTRAVENOUS

## 2022-10-02 MED ORDER — ACETAMINOPHEN 325 MG PO TABS
650.0000 mg | ORAL_TABLET | Freq: Four times a day (QID) | ORAL | Status: DC | PRN
  Administered 2022-10-03 – 2022-10-04 (×3): 650 mg via ORAL
  Filled 2022-10-02 (×4): qty 2

## 2022-10-02 MED ORDER — LIDOCAINE 2% (20 MG/ML) 5 ML SYRINGE
INTRAMUSCULAR | Status: DC | PRN
  Administered 2022-10-02: 100 mg via INTRAVENOUS

## 2022-10-02 MED ORDER — ACETAMINOPHEN 650 MG RE SUPP: 650.0000 mg | Freq: Four times a day (QID) | RECTAL | Status: DC | PRN

## 2022-10-02 MED ORDER — FENTANYL CITRATE (PF) 100 MCG/2ML IJ SOLN
INTRAMUSCULAR | Status: DC | PRN
  Administered 2022-10-02 (×6): 50 ug via INTRAVENOUS

## 2022-10-02 MED ORDER — MEPERIDINE HCL 50 MG/ML IJ SOLN: 6.2500 mg | INTRAMUSCULAR | Status: DC | PRN

## 2022-10-02 MED ORDER — TRAMADOL HCL 50 MG PO TABS: 50.0000 mg | ORAL_TABLET | Freq: Four times a day (QID) | ORAL | 0 refills | Status: DC | PRN

## 2022-10-02 MED ORDER — ONDANSETRON HCL 4 MG/2ML IJ SOLN
INTRAMUSCULAR | Status: DC | PRN
  Administered 2022-10-02: 4 mg via INTRAVENOUS

## 2022-10-02 MED ORDER — PROPOFOL 10 MG/ML IV BOLUS
INTRAVENOUS | Status: DC | PRN
  Administered 2022-10-02: 20 mg via INTRAVENOUS
  Administered 2022-10-02: 160 mg via INTRAVENOUS

## 2022-10-02 MED ORDER — TRAMADOL HCL 50 MG PO TABS
50.0000 mg | ORAL_TABLET | Freq: Four times a day (QID) | ORAL | Status: DC | PRN
  Administered 2022-10-02 – 2022-10-04 (×2): 50 mg via ORAL
  Filled 2022-10-02 (×2): qty 1

## 2022-10-02 MED ORDER — OXYCODONE HCL 5 MG PO TABS
5.0000 mg | ORAL_TABLET | ORAL | Status: DC | PRN
  Administered 2022-10-02 – 2022-10-04 (×6): 10 mg via ORAL
  Filled 2022-10-02 (×7): qty 2

## 2022-10-02 MED ORDER — LOSARTAN POTASSIUM-HCTZ 50-12.5 MG PO TABS: 1.0000 | ORAL_TABLET | Freq: Every day | ORAL | Status: DC

## 2022-10-02 MED ORDER — LIDOCAINE HCL (PF) 2 % IJ SOLN
INTRAMUSCULAR | Status: AC
  Filled 2022-10-02: qty 5

## 2022-10-02 MED ORDER — DEXAMETHASONE SODIUM PHOSPHATE 10 MG/ML IJ SOLN
INTRAMUSCULAR | Status: AC
  Filled 2022-10-02: qty 1

## 2022-10-02 MED ORDER — FENTANYL CITRATE PF 50 MCG/ML IJ SOSY
PREFILLED_SYRINGE | INTRAMUSCULAR | Status: AC
  Filled 2022-10-02: qty 1

## 2022-10-02 MED ORDER — LOSARTAN POTASSIUM 50 MG PO TABS
50.0000 mg | ORAL_TABLET | Freq: Every day | ORAL | Status: DC
  Administered 2022-10-02 – 2022-10-05 (×3): 50 mg via ORAL
  Filled 2022-10-02 (×4): qty 1

## 2022-10-02 MED ORDER — ACETAMINOPHEN 160 MG/5ML PO SOLN: 325.0000 mg | ORAL | Status: DC | PRN

## 2022-10-02 MED ORDER — OXYCODONE HCL 5 MG PO TABS: 5.0000 mg | ORAL_TABLET | Freq: Once | ORAL | Status: DC | PRN

## 2022-10-02 MED ORDER — HYDROMORPHONE HCL 1 MG/ML IJ SOLN
1.0000 mg | INTRAMUSCULAR | Status: DC | PRN
  Administered 2022-10-02 – 2022-10-03 (×3): 1 mg via INTRAVENOUS
  Filled 2022-10-02 (×3): qty 1

## 2022-10-02 MED ORDER — LACTATED RINGERS IV SOLN: INTRAVENOUS | Status: DC

## 2022-10-02 MED ORDER — ONDANSETRON HCL 4 MG/2ML IJ SOLN
INTRAMUSCULAR | Status: AC
  Filled 2022-10-02: qty 2

## 2022-10-02 MED ORDER — MIDAZOLAM HCL 2 MG/2ML IJ SOLN
INTRAMUSCULAR | Status: AC
  Filled 2022-10-02: qty 2

## 2022-10-02 MED ORDER — MIDAZOLAM HCL 5 MG/5ML IJ SOLN
INTRAMUSCULAR | Status: DC | PRN
  Administered 2022-10-02: 2 mg via INTRAVENOUS

## 2022-10-02 MED ORDER — 0.9 % SODIUM CHLORIDE (POUR BTL) OPTIME
TOPICAL | Status: DC | PRN
  Administered 2022-10-02: 1000 mL

## 2022-10-02 MED ORDER — CALCIUM CARBONATE ANTACID 500 MG PO CHEW: 2.0000 | CHEWABLE_TABLET | Freq: Three times a day (TID) | ORAL | 1 refills | Status: DC

## 2022-10-02 MED ORDER — PANTOPRAZOLE SODIUM 40 MG PO TBEC
40.0000 mg | DELAYED_RELEASE_TABLET | Freq: Every day | ORAL | Status: DC
  Administered 2022-10-04 – 2022-10-05 (×2): 40 mg via ORAL
  Filled 2022-10-02 (×2): qty 1

## 2022-10-02 MED ORDER — ORAL CARE MOUTH RINSE: 15.0000 mL | Freq: Once | OROMUCOSAL | Status: AC

## 2022-10-02 MED ORDER — CALCIUM CARBONATE 1250 (500 CA) MG PO TABS
2.0000 | ORAL_TABLET | Freq: Three times a day (TID) | ORAL | Status: DC
  Administered 2022-10-02 – 2022-10-05 (×5): 2500 mg via ORAL
  Filled 2022-10-02 (×5): qty 2

## 2022-10-02 MED ORDER — HYDROCHLOROTHIAZIDE 12.5 MG PO TABS
12.5000 mg | ORAL_TABLET | Freq: Every day | ORAL | Status: DC
  Administered 2022-10-02 – 2022-10-05 (×3): 12.5 mg via ORAL
  Filled 2022-10-02 (×3): qty 1

## 2022-10-02 MED ORDER — CEFAZOLIN SODIUM-DEXTROSE 2-4 GM/100ML-% IV SOLN
2.0000 g | INTRAVENOUS | Status: AC
  Administered 2022-10-02: 2 g via INTRAVENOUS
  Filled 2022-10-02: qty 100

## 2022-10-02 MED ORDER — CHLORHEXIDINE GLUCONATE 0.12 % MT SOLN
15.0000 mL | Freq: Once | OROMUCOSAL | Status: AC
  Administered 2022-10-02: 15 mL via OROMUCOSAL

## 2022-10-02 MED ORDER — FLUTICASONE FUROATE-VILANTEROL 100-25 MCG/ACT IN AEPB
1.0000 | INHALATION_SPRAY | Freq: Every day | RESPIRATORY_TRACT | Status: DC
  Administered 2022-10-02: 1 via RESPIRATORY_TRACT
  Filled 2022-10-02: qty 28

## 2022-10-02 MED ORDER — ROCURONIUM BROMIDE 10 MG/ML (PF) SYRINGE
PREFILLED_SYRINGE | INTRAVENOUS | Status: DC | PRN
  Administered 2022-10-02: 20 mg via INTRAVENOUS
  Administered 2022-10-02: 50 mg via INTRAVENOUS

## 2022-10-02 MED ORDER — FENTANYL CITRATE PF 50 MCG/ML IJ SOSY
25.0000 ug | PREFILLED_SYRINGE | INTRAMUSCULAR | Status: DC | PRN
  Administered 2022-10-02 (×3): 50 ug via INTRAVENOUS

## 2022-10-02 MED ORDER — SUGAMMADEX SODIUM 500 MG/5ML IV SOLN
INTRAVENOUS | Status: DC | PRN
  Administered 2022-10-02: 240 mg via INTRAVENOUS

## 2022-10-02 MED ORDER — ONDANSETRON HCL 4 MG/2ML IJ SOLN
4.0000 mg | Freq: Four times a day (QID) | INTRAMUSCULAR | Status: DC | PRN
  Administered 2022-10-03 (×2): 4 mg via INTRAVENOUS
  Filled 2022-10-02 (×2): qty 2

## 2022-10-02 MED ORDER — ONDANSETRON HCL 4 MG/2ML IJ SOLN: 4.0000 mg | Freq: Once | INTRAMUSCULAR | Status: DC | PRN

## 2022-10-02 MED ORDER — PROPOFOL 10 MG/ML IV BOLUS
INTRAVENOUS | Status: AC
  Filled 2022-10-02: qty 20

## 2022-10-02 MED ORDER — COQ10 100 MG PO CAPS: 100.0000 mg | ORAL_CAPSULE | Freq: Every morning | ORAL | Status: DC

## 2022-10-02 MED ORDER — HEMOSTATIC AGENTS (NO CHARGE) OPTIME
TOPICAL | Status: DC | PRN
  Administered 2022-10-02: 1 via TOPICAL

## 2022-10-02 MED ORDER — ALBUTEROL SULFATE (2.5 MG/3ML) 0.083% IN NEBU: 3.0000 mL | INHALATION_SOLUTION | Freq: Four times a day (QID) | RESPIRATORY_TRACT | Status: DC | PRN

## 2022-10-02 SURGICAL SUPPLY — 29 items
ATTRACTOMAT 16X20 MAGNETIC DRP (DRAPES) ×1 IMPLANT
BAG COUNTER SPONGE SURGICOUNT (BAG) ×1 IMPLANT
BLADE SURG 15 STRL LF DISP TIS (BLADE) ×1 IMPLANT
BLADE SURG 15 STRL SS (BLADE) ×1
CHLORAPREP W/TINT 26 (MISCELLANEOUS) ×1 IMPLANT
CLIP TI MEDIUM 6 (CLIP) ×2 IMPLANT
CLIP TI WIDE RED SMALL 6 (CLIP) ×2 IMPLANT
COVER SURGICAL LIGHT HANDLE (MISCELLANEOUS) ×1 IMPLANT
DERMABOND ADVANCED .7 DNX12 (GAUZE/BANDAGES/DRESSINGS) ×1 IMPLANT
DRAPE LAPAROTOMY T 98X78 PEDS (DRAPES) ×1 IMPLANT
DRAPE UTILITY XL STRL (DRAPES) ×1 IMPLANT
ELECT PENCIL ROCKER SW 15FT (MISCELLANEOUS) ×1 IMPLANT
ELECT REM PT RETURN 15FT ADLT (MISCELLANEOUS) ×1 IMPLANT
GAUZE 4X4 16PLY ~~LOC~~+RFID DBL (SPONGE) ×1 IMPLANT
GLOVE SURG ORTHO 8.0 STRL STRW (GLOVE) ×1 IMPLANT
GOWN STRL REUS W/ TWL XL LVL3 (GOWN DISPOSABLE) ×2 IMPLANT
GOWN STRL REUS W/TWL XL LVL3 (GOWN DISPOSABLE) ×2
HEMOSTAT SURGICEL 2X4 FIBR (HEMOSTASIS) ×1 IMPLANT
ILLUMINATOR WAVEGUIDE N/F (MISCELLANEOUS) ×1 IMPLANT
KIT BASIN OR (CUSTOM PROCEDURE TRAY) ×1 IMPLANT
KIT TURNOVER KIT A (KITS) IMPLANT
PACK BASIC VI WITH GOWN DISP (CUSTOM PROCEDURE TRAY) ×1 IMPLANT
SHEARS HARMONIC 9CM CVD (BLADE) ×1 IMPLANT
SUT MNCRL AB 4-0 PS2 18 (SUTURE) ×1 IMPLANT
SUT VIC AB 3-0 SH 18 (SUTURE) ×2 IMPLANT
SYR BULB IRRIG 60ML STRL (SYRINGE) ×1 IMPLANT
TOWEL OR 17X26 10 PK STRL BLUE (TOWEL DISPOSABLE) ×1 IMPLANT
TOWEL OR NON WOVEN STRL DISP B (DISPOSABLE) ×1 IMPLANT
TUBING CONNECTING 10 (TUBING) ×1 IMPLANT

## 2022-10-02 NOTE — Progress Notes (Signed)
PHARMACIST - PHYSICIAN ORDER COMMUNICATION  CONCERNING: P&T Medication Policy on Herbal Medications  DESCRIPTION:  This patient's order for:  Co-Q  has been noted.  This product(s) is classified as an "herbal" or natural product. Due to a lack of definitive safety studies or FDA approval, nonstandard manufacturing practices, plus the potential risk of unknown drug-drug interactions while on inpatient medications, the Pharmacy and Therapeutics Committee does not permit the use of "herbal" or natural products of this type within [facility].   ACTION TAKEN: The pharmacy department is unable to verify this order at this time and your patient has been informed of this safety policy. Please reevaluate patient's clinical condition at discharge and address if the herbal or natural product(s) should be resumed at that time.   Peggyann Juba, PharmD, BCPS 10/02/2022 2:53 PM Pharmacy: [phone]

## 2022-10-02 NOTE — Discharge Instructions (Signed)
[facility] - [name]  THYROID & PARATHYROID SURGERY:  POST-OP INSTRUCTIONS  Always review the instruction sheet provided by the hospital nurse at discharge.  A prescription for pain medication may be sent to your pharmacy at the time of discharge.  Take your pain medication as prescribed.  If narcotic pain medicine is not needed, then you may take acetaminophen (Tylenol) or ibuprofen (Advil) as needed for pain or soreness.  Take your normal home medications as prescribed unless otherwise directed.  If you need a refill on your pain medication, please contact the office during regular business hours.  Prescriptions will not be processed by the office after 5:00PM or on weekends.  Start with a light diet upon arrival home, such as soup and crackers or toast.  Be sure to drink plenty of fluids.  Resume your normal diet the day after surgery.  Most patients will experience some swelling and bruising on the chest and neck area.  Ice packs will help for the first 48 hours after arriving home.  Swelling and bruising will take several days to resolve.   It is common to experience some constipation after surgery.  Increasing fluid intake and taking a stool softener (Colace) will usually help to prevent this problem.  A mild laxative (Milk of Magnesia or Miralax) should be taken according to package directions if there has been no bowel movement after 48 hours.  Dermabond glue covers your incision. This seals the wound and you may shower at any time. The Dermabond will remain in place for about a week.  You may gradually remove the glue when it loosens around the edges.  If you need to loosen the Dermabond for removal, apply a layer of Vaseline to the wound for 15 minutes and then remove with a Kleenex. Your sutures are under the skin and will not show - they will dissolve on their own.  You may resume light daily activities beginning the day after discharge (such as self-care,  walking, climbing stairs), gradually increasing activities as tolerated. You may have sexual intercourse when it is comfortable. Refrain from any heavy lifting or straining until approved by your doctor. You may drive when you no longer are taking prescription pain medication, you can comfortably wear a seatbelt, and you can safely maneuver your car and apply the brakes.  You will see your doctor in the office for a follow-up appointment approximately three weeks after your surgery.  Make sure that you call for this appointment within a day or two after you arrive home to insure a convenient appointment time. Please have any requested laboratory tests performed a few days prior to your office visit so that the results will be available at your follow up appointment.  WHEN TO CALL THE [facility] OFFICE: -- Fever greater than 101.5 -- Inability to urinate -- Nausea and/or vomiting - persistent -- Extreme swelling or bruising -- Continued bleeding from incision -- Increased pain, redness, or drainage from the incision -- Difficulty swallowing or breathing -- Muscle cramping or spasms -- Numbness or tingling in hands or around lips  The clinic staff is available to answer your questions during regular business hours.  Please don't hesitate to call and ask to speak to one of the nurses if you have concerns.  [facility] OFFICE: [phone] (24 hours)  Please sign up for MyChart accounts. This will allow you to communicate directly with my nurse or myself without having to call the office. It will also allow you   to view your test results. You will need to enroll in MyChart for my office ([facility]) and for the hospital ([facility]).  Gregg Lacivita, MD [facility] A [facility] practice 

## 2022-10-02 NOTE — Interval H&P Note (Signed)
History and Physical Interval Note:  10/02/2022 10:22 AM  Gregg [name]  has presented today for surgery, with the diagnosis of [place].  The various methods of treatment have been discussed with the patient and family. After consideration of risks, benefits and other options for treatment, the patient has consented to    Procedure(s): TOTAL THYROIDECTOMY (N/A) as a surgical intervention.    The patient's history has been reviewed, patient examined, no change in status, stable for surgery.  I have reviewed the patient's chart and labs.  Questions were answered to the patient's satisfaction.    Armandina Gemma, [facility] Office: [place]

## 2022-10-02 NOTE — Anesthesia Procedure Notes (Signed)
Procedure Name: Intubation Date/Time: 10/02/2022 10:57 AM  Performed by: West Pugh, CRNAPre-anesthesia Checklist: Patient identified, Emergency Drugs available, Suction available, Patient being monitored and Timeout performed Patient Re-evaluated:Patient Re-evaluated prior to induction Oxygen Delivery Method: Circle system utilized Preoxygenation: Pre-oxygenation with 100% oxygen Induction Type: IV induction Ventilation: Mask ventilation without difficulty Laryngoscope Size: Mac and 4 Grade View: Grade I Tube type: Oral Number of attempts: 1 Airway Equipment and Method: Stylet Placement Confirmation: ETT inserted through vocal cords under direct vision, positive ETCO2, CO2 detector and breath sounds checked- equal and bilateral Secured at: 21 cm Tube secured with: Tape Dental Injury: Teeth and Oropharynx as per pre-operative assessment

## 2022-10-02 NOTE — Op Note (Signed)
Procedure Note  Pre-operative Diagnosis:  Toxic multinodular thyroid goiter  Post-operative Diagnosis:  same  Surgeon:  Armandina Gemma, MD  Assistant:  Johnathan Hausen, MD   Procedure:  Total thyroidectomy  Anesthesia:  General  Estimated Blood Loss:  minimal  Drains: none         Specimen: thyroid to pathology  Indications:  Patient is referred by Dr. 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 Patient is known to my practice having undergone previous appendectomy, cholecystectomy, and hernia repair by my partners. Patient was recently seen by Dr. Kaylyn Lim and referred over to me for thyroidectomy. Patient has approximately a 1 year history of multinodular thyroid goiter with hyperthyroidism. He was seen in December 2022 by Dr. Renato Shin and started on methimazole for hyperthyroidism. Has developed some mild compressive symptoms with intermittent hoarseness. His recent TSH level was normal at 1.13. Patient did have an ultrasound performed at the request of Dr. Loanne Drilling on December 18, 2021. This showed multiple bilateral thyroid nodules. Fine-needle aspiration biopsy was recommended on 4 of the nodules. Does have a family history of thyroid cancer in his father who died in 79. He also has a sister who underwent thyroid surgery and his mother was on thyroid medication. Patient presents today to discuss proceeding with thyroidectomy in lieu of undergoing fine-needle aspiration biopsy. Patient is retired from [employer]. 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  Procedure Details: Procedure was done in OR #1 at the [facility]. The patient was brought to the operating room and placed in a supine position on the operating room table. Following administration of general anesthesia, the patient was positioned and then prepped and draped in the usual  aseptic fashion. After ascertaining that an adequate level of anesthesia had been achieved, a small Kocher incision was made with #15 blade. Dissection was carried through subcutaneous tissues and platysma. Hemostasis was achieved with the electrocautery. Skin flaps were elevated cephalad and caudad from the thyroid notch to the sternal notch. A Mahorner self-retaining retractor was placed for exposure. Strap muscles were incised in the midline and dissection was begun on the left side.  Strap muscles were reflected laterally.  Left thyroid lobe was markedly enlarged and multinodular.  The left lobe was gently mobilized with blunt dissection. Superior pole vessels were dissected out and divided individually between small and medium ligaclips with the harmonic scalpel. The thyroid lobe was rolled anteriorly. Branches of the inferior thyroid artery were divided between ligaclips with the harmonic scalpel. Inferior venous tributaries were divided between ligaclips. Both the superior and inferior parathyroid glands were identified and preserved on their vascular pedicles. The recurrent laryngeal nerve was identified and preserved along its course. The ligament of Gwenlyn Found was released with the electrocautery and the gland was mobilized onto the anterior trachea. Isthmus was mobilized across the midline. There was a small pyramidal lobe present on the right anterior thyroid cartilage.  This was dissected off and resected and place with the specimen. Dry pack was placed in the left neck.  The right thyroid lobe was gently mobilized with blunt dissection. Right thyroid lobe was markedly enlarged with multiple nodules. Superior pole vessels were dissected out and divided between small and medium ligaclips with the Harmonic scalpel. Superior parathyroid was identified and preserved. Inferior venous tributaries were divided between medium ligaclips with the harmonic scalpel. The right thyroid lobe was rolled anteriorly  and the  branches of the inferior thyroid artery divided between small ligaclips. The right recurrent laryngeal nerve was identified and preserved along its course. The ligament of Gwenlyn Found was released with the electrocautery. The right thyroid lobe was mobilized onto the anterior trachea and the remainder of the thyroid was dissected off the anterior trachea and the thyroid was completely excised. A suture was used to mark the left lobe. The entire thyroid gland was submitted to pathology for review.  The neck was irrigated with warm saline. Fibrillar was placed throughout the operative field. Strap muscles were approximated in the midline with interrupted 3-0 Vicryl sutures. Platysma was closed with interrupted 3-0 Vicryl sutures. Skin was closed with a running 4-0 Monocryl subcuticular suture. Wound was washed and Dermabond was applied. The patient was awakened from anesthesia and brought to the recovery room. The patient tolerated the procedure well.   Armandina Gemma, [facility] Office: [phone]

## 2022-10-02 NOTE — Anesthesia Postprocedure Evaluation (Signed)
Anesthesia Post Note  Patient: Gregg [name]  Procedure(s) Performed: TOTAL THYROIDECTOMY (Neck)     Patient location during evaluation: PACU Anesthesia Type: General Level of consciousness: awake and alert Pain management: pain level controlled Vital Signs Assessment: post-procedure vital signs reviewed and stable Respiratory status: spontaneous breathing, nonlabored ventilation, respiratory function stable and patient connected to nasal cannula oxygen Cardiovascular status: blood pressure returned to baseline and stable Postop Assessment: no apparent nausea or vomiting Anesthetic complications: no   No notable events documented.  Last Vitals:  Vitals:   10/02/22 1345 10/02/22 1400  BP: (!) 160/74 (!) 170/91  Pulse: 94 95  Resp: (!) 8 12  Temp:    SpO2: 96% 97%    Last Pain:  Vitals:   10/02/22 1400  TempSrc:   PainSc: 6                  Ottie Tillery

## 2022-10-02 NOTE — Anesthesia Preprocedure Evaluation (Addendum)
Anesthesia Evaluation  Patient identified by MRN, date of birth, ID band Patient awake    Reviewed: Allergy & Precautions, NPO status , Patient's Chart, lab work & pertinent test results  Airway Mallampati: IV  TM Distance: <3 FB Neck ROM: Full    Dental no notable dental hx. (+) Dental Advisory Given, Edentulous Upper, Poor Dentition, Missing,    Pulmonary former smoker,    Pulmonary exam normal breath sounds clear to auscultation       Cardiovascular hypertension, Pt. on medications and Pt. on home beta blockers Normal cardiovascular exam Rhythm:Regular Rate:Normal     Neuro/Psych Anxiety Depression negative neurological ROS     GI/Hepatic GERD  ,  Endo/Other  Hyperthyroidism   Renal/GU      Musculoskeletal negative musculoskeletal ROS (+)   Abdominal   Peds  Hematology   Anesthesia Other Findings   Reproductive/Obstetrics                            Anesthesia Physical  Anesthesia Plan  ASA: 3  Anesthesia Plan: General   Post-op Pain Management:    Induction: Intravenous  PONV Risk Score and Plan: 3 and Ondansetron and Propofol infusion  Airway Management Planned: Oral ETT, LMA and Video Laryngoscope Planned  Additional Equipment: None  Intra-op Plan:   Post-operative Plan: Extubation in OR  Informed Consent: I have reviewed the patients History and Physical, chart, labs and discussed the procedure including the risks, benefits and alternatives for the proposed anesthesia with the patient or authorized representative who has indicated his/her understanding and acceptance.     Dental advisory given  Plan Discussed with: Anesthesiologist and CRNA  Anesthesia Plan Comments: (  )       Anesthesia Quick Evaluation

## 2022-10-02 NOTE — Transfer of Care (Signed)
Immediate Anesthesia Transfer of Care Note  Patient: Gregg [name]  Procedure(s) Performed: TOTAL THYROIDECTOMY (Neck)  Patient Location: PACU  Anesthesia Type:General  Level of Consciousness: drowsy  Airway & Oxygen Therapy: Patient Spontanous Breathing and Patient connected to face mask oxygen  Post-op Assessment: Report given to RN and Post -op Vital signs reviewed and stable  Post vital signs: Reviewed and stable  Last Vitals:  Vitals Value Taken Time  BP 162/91 10/02/22 1330  Temp 37 C 10/02/22 1330  Pulse 94 10/02/22 1332  Resp 29 10/02/22 1332  SpO2 95 % 10/02/22 1332  Vitals shown include unvalidated device data.  Last Pain:  Vitals:   10/02/22 1330  TempSrc:   PainSc: Asleep         Complications: No notable events documented.

## 2022-10-03 ENCOUNTER — Encounter (HOSPITAL_COMMUNITY): Payer: Self-pay | Admitting: Surgery

## 2022-10-03 DIAGNOSIS — E0520 Thyrotoxicosis with toxic multinodular goiter without thyrotoxic crisis or storm: Secondary | ICD-10-CM | POA: Diagnosis present

## 2022-10-03 DIAGNOSIS — Z8719 Personal history of other diseases of the digestive system: Secondary | ICD-10-CM | POA: Diagnosis not present

## 2022-10-03 DIAGNOSIS — E785 Hyperlipidemia, unspecified: Secondary | ICD-10-CM | POA: Diagnosis present

## 2022-10-03 DIAGNOSIS — K219 Gastro-esophageal reflux disease without esophagitis: Secondary | ICD-10-CM | POA: Diagnosis present

## 2022-10-03 DIAGNOSIS — F419 Anxiety disorder, unspecified: Secondary | ICD-10-CM | POA: Diagnosis present

## 2022-10-03 DIAGNOSIS — I10 Essential (primary) hypertension: Secondary | ICD-10-CM | POA: Diagnosis present

## 2022-10-03 DIAGNOSIS — Z8249 Family history of ischemic heart disease and other diseases of the circulatory system: Secondary | ICD-10-CM | POA: Diagnosis not present

## 2022-10-03 DIAGNOSIS — F32A Depression, unspecified: Secondary | ICD-10-CM | POA: Diagnosis present

## 2022-10-03 DIAGNOSIS — Z87891 Personal history of nicotine dependence: Secondary | ICD-10-CM | POA: Diagnosis not present

## 2022-10-03 DIAGNOSIS — Z833 Family history of diabetes mellitus: Secondary | ICD-10-CM | POA: Diagnosis not present

## 2022-10-03 DIAGNOSIS — Z808 Family history of malignant neoplasm of other organs or systems: Secondary | ICD-10-CM | POA: Diagnosis not present

## 2022-10-03 DIAGNOSIS — J4530 Mild persistent asthma, uncomplicated: Secondary | ICD-10-CM | POA: Diagnosis present

## 2022-10-03 DIAGNOSIS — Z9049 Acquired absence of other specified parts of digestive tract: Secondary | ICD-10-CM | POA: Diagnosis not present

## 2022-10-03 LAB — BASIC METABOLIC PANEL
Anion gap: 7 (ref 5–15)
BUN: 19 mg/dL (ref 8–23)
CO2: 31 mmol/L (ref 22–32)
Calcium: 9.3 mg/dL (ref 8.9–10.3)
Chloride: 99 mmol/L (ref 98–111)
Creatinine, Ser: 1.09 mg/dL (ref 0.61–1.24)
GFR, Estimated: >60 mL/min (ref >60)
Glucose, Bld: 144 mg/dL — ABNORMAL HIGH (ref 70–99)
Potassium: 3.7 mmol/L (ref 3.5–5.1)
Sodium: 137 mmol/L (ref 135–145)

## 2022-10-03 MED ORDER — SODIUM CHLORIDE 0.9 % IV SOLN
12.5000 mg | Freq: Three times a day (TID) | INTRAVENOUS | Status: DC | PRN
  Administered 2022-10-03: 12.5 mg via INTRAVENOUS
  Filled 2022-10-03: qty 12.5

## 2022-10-03 NOTE — Progress Notes (Signed)
Mobility Specialist Cancellation/Refusal Note:   Reason for Cancellation/Refusal: Pt declined mobility at this time. Pt stated he wants to get some rest. Will check back as schedule permits.    10/03/22 1127  Mobility  Activity Refused mobility     [employer]

## 2022-10-03 NOTE — Progress Notes (Signed)
1 Day Post-Op   Subjective/Chief Complaint: Having moderate nausea this morning Reports only mild to moderate pain at incision   Objective: Vital signs in last 24 hours: Temp:  [98.1 F (36.7 C)-99.3 F (37.4 C)] 98.7 F (37.1 C) (10/14 0516) Pulse Rate:  [71-110] 88 (10/14 0516) Resp:  [8-18] 16 (10/14 0516) BP: (118-179)/(73-97) 118/75 (10/14 0516) SpO2:  [92 %-99 %] 94 % (10/14 0516) Weight:  [82.1 kg] 82.1 kg (10/13 0909)    Intake/Output from previous day: 10/13 0701 - 10/14 0700 In: 2007.9 [P.O.:180; I.V.:1727.9; IV Piggyback:100] Out: 890 [Urine:880; Blood:10] Intake/Output this shift: No intake/output data recorded.  Exam: Awake and alert Neck incision clean, no hematoma Voice normal  Lab Results:  No results for input(s): "WBC", "HGB", "HCT", "PLT" in the last 72 hours. BMET Recent Labs    10/03/22 0445  NA 137  K 3.7  CL 99  CO2 31  GLUCOSE 144*  BUN 19  CREATININE 1.09  CALCIUM 9.3   PT/INR No results for input(s): "LABPROT", "INR" in the last 72 hours. ABG No results for input(s): "PHART", "HCO3" in the last 72 hours.  Invalid input(s): "PCO2", "PO2"  Studies/Results: No results found.  Anti-infectives: Anti-infectives (From admission, onward)    Start     Dose/Rate Route Frequency Ordered Stop   10/02/22 0900  ceFAZolin (ANCEF) IVPB 2g/100 mL premix        2 g 200 mL/hr over 30 Minutes Intravenous On call to O.R. 10/02/22 0850 10/02/22 1128       Assessment/Plan: s/p Procedure(s): TOTAL THYROIDECTOMY (N/A)  Calcium normal this morning Treating nausea.  Home later today if nausea improves.  If not, hopefully home tomorrow   Coralie Keens MD 10/03/2022

## 2022-10-04 ENCOUNTER — Other Ambulatory Visit: Payer: Self-pay

## 2022-10-04 MED ORDER — ALUM & MAG HYDROXIDE-SIMETH 200-200-20 MG/5ML PO SUSP
30.0000 mL | Freq: Four times a day (QID) | ORAL | Status: DC | PRN
  Administered 2022-10-04: 30 mL via ORAL
  Filled 2022-10-04: qty 30

## 2022-10-04 MED ORDER — CALCIUM CARBONATE ANTACID 500 MG PO CHEW
2.0000 | CHEWABLE_TABLET | Freq: Four times a day (QID) | ORAL | Status: DC | PRN
  Administered 2022-10-04 – 2022-10-05 (×2): 400 mg via ORAL
  Filled 2022-10-04 (×2): qty 2

## 2022-10-04 NOTE — Progress Notes (Signed)
2 Days Post-Op   Subjective/Chief Complaint: Nausea and emesis resolved Has some increased incisional pain after the emesis Low grade fever   Objective: Vital signs in last 24 hours: Temp:  [98.6 F (37 C)-100 F (37.8 C)] 99.1 F (37.3 C) (10/15 0557) Pulse Rate:  [80-103] 103 (10/15 0557) Resp:  [16-18] 18 (10/15 0557) BP: (110-126)/(64-79) 110/64 (10/15 0557) SpO2:  [91 %-93 %] 93 % (10/15 0557) Last BM Date : 10/02/22  Intake/Output from previous day: 10/14 0701 - 10/15 0700 In: 1484.8 [P.O.:250; I.V.:1184.8; IV Piggyback:50] Out: 200 [Urine:200] Intake/Output this shift: No intake/output data recorded.  Exam: Awake and alert Looks more comfortable Lungs clear Neck incision with slightly more swelling but no hematoma  Lab Results:  No results for input(s): "WBC", "HGB", "HCT", "PLT" in the last 72 hours. BMET Recent Labs    10/03/22 0445  NA 137  K 3.7  CL 99  CO2 31  GLUCOSE 144*  BUN 19  CREATININE 1.09  CALCIUM 9.3   PT/INR No results for input(s): "LABPROT", "INR" in the last 72 hours. ABG No results for input(s): "PHART", "HCO3" in the last 72 hours.  Invalid input(s): "PCO2", "PO2"  Studies/Results: No results found.  Anti-infectives: Anti-infectives (From admission, onward)    Start     Dose/Rate Route Frequency Ordered Stop   10/02/22 0900  ceFAZolin (ANCEF) IVPB 2g/100 mL premix        2 g 200 mL/hr over 30 Minutes Intravenous On call to O.R. 10/02/22 0850 10/02/22 1128       Assessment/Plan: s/p Procedure(s): TOTAL THYROIDECTOMY (N/A)  Given his low grade temp and need for more pain meds, will keep today and likely discharge tomorrow.  He agrees with the plans  LOS: 1 day    Coralie Keens MD 10/04/2022

## 2022-10-05 LAB — BASIC METABOLIC PANEL
Anion gap: 7 (ref 5–15)
BUN: 17 mg/dL (ref 8–23)
CO2: 31 mmol/L (ref 22–32)
Calcium: 8.8 mg/dL — ABNORMAL LOW (ref 8.9–10.3)
Chloride: 96 mmol/L — ABNORMAL LOW (ref 98–111)
Creatinine, Ser: 0.96 mg/dL (ref 0.61–1.24)
GFR, Estimated: >60 mL/min (ref >60)
Glucose, Bld: 101 mg/dL — ABNORMAL HIGH (ref 70–99)
Potassium: 3.3 mmol/L — ABNORMAL LOW (ref 3.5–5.1)
Sodium: 134 mmol/L — ABNORMAL LOW (ref 135–145)

## 2022-10-05 NOTE — Discharge Summary (Signed)
    Physician Discharge Summary   Patient ID: Gregg [name] MRN: [id number] DOB/AGE: 1956-11-25 66 y.o.  Admit date: 10/02/2022  Discharge date: 10/05/2022  Discharge Diagnoses:  Principal Problem:   Hyperthyroidism Active Problems:   Multinodular goiter   Toxic multinodular goiter   Discharged Condition: good  Hospital Course: Patient was admitted for observation following total thyroidectomy.  Post op course was complicated by nausea and pain and reflux symptoms.  Pain was well controlled.  Tolerated diet.  Post op calcium level on morning following surgery was 8.7 mg/dl.  Patient was prepared for discharge home on POD#3.  Consults: None  Treatments: surgery: total thyroidectomy  Discharge Exam: Blood pressure 123/81, pulse 81, temperature 98.5 F (36.9 C), temperature source Oral, resp. rate 12, height '5\' 9"'$  (1.753 m), weight 82.1 kg, SpO2 90 %. HEENT - clear Neck - wound dry and intact; Dermabond in place; voice normal; mild STS and ecchymosis  Disposition: Home  Discharge Instructions     Diet - low sodium heart healthy   Complete by: As directed    Increase activity slowly   Complete by: As directed    No dressing needed   Complete by: As directed          Follow-up Information     Armandina Gemma, MD. Schedule an appointment as soon as possible for a visit in 3 week(s).   Specialty: General Surgery Why: For wound re-check Contact information: [place] [address] [phone]                 Anastasio Wogan, [facility] Office: [phone]   Signed: Armandina Gemma 10/05/2022, 9:27 AM

## 2022-10-05 NOTE — Progress Notes (Signed)
Pt given d/c paperwork and all questions answered. Pt brought to front entrance via WC.

## 2022-10-06 ENCOUNTER — Telehealth: Payer: Self-pay

## 2022-10-06 NOTE — Telephone Encounter (Signed)
Transition Care Management Follow-up Telephone Call Date of discharge and from where: 10/05/2022, [facility] How have you been since you were released from the hospital? He said he is doing better, having some trouble talking but that is due to anesthesia.  Any questions or concerns? No  Items Reviewed: Did the pt receive and understand the discharge instructions provided? Yes  Medications obtained and verified? Yes - he said he has all of his medications and did not have any questions about the med regime  Other? No  Any new allergies since your discharge? No  Dietary orders reviewed? Yes- he said he has been tolerating soft food. He had been having some difficulty swallowing when he was in the hospital but is doing better.  Do you have support at home? Yes   Home Care and Equipment/Supplies: Were home health services ordered? no If so, what is the name of the agency? N/a  Has the agency set up a time to come to the patient's home? not applicable Were any new equipment or medical supplies ordered?  No What is the name of the medical supply agency? N/a Were you able to get the supplies/equipment? not applicable Do you have any questions related to the use of the equipment or supplies? No  Functional Questionnaire: (I = Independent and D = Dependent) ADLs: independent  Follow up appointments reviewed:  PCP Hospital f/u appt confirmed? Yes  Scheduled to see Durene Fruits, NP -10/15/2022.  [facility] f/u appt confirmed? Yes  Scheduled to see surgeon - 10/14/2022.  Are transportation arrangements needed? No  If their condition worsens, is the pt aware to call PCP or go to the Emergency Dept.? Yes Was the patient provided with contact information for the PCP's office or ED? Yes Was to pt encouraged to call back with questions or concerns? Yes

## 2022-10-08 ENCOUNTER — Encounter: Payer: Self-pay | Admitting: Family

## 2022-10-09 NOTE — Telephone Encounter (Signed)
Will need proof of positive flu test prior to prescribing.

## 2022-10-09 NOTE — Progress Notes (Unsigned)
Virtual Visit via Telephone Note  I connected with Gregg [name], on 10/12/2022 at 2:08 PM  by telephone and verified that I am speaking with the correct person using two identifiers.  Consent: I discussed the limitations, risks, security and privacy concerns of performing an evaluation and management service by telephone and the availability of in person appointments. I also discussed with the patient that there may be a patient responsible charge related to this service. The patient expressed understanding and agreed to proceed.   Location of Patient: Home  Location of Provider: [facility] [place] participating in Telemedicine visit: Tilda Franco, NP  History of Present Illness: Gregg [name] is a 66 year-old male who presents for hospital discharge follow-up.  Date of Admission: 10/02/2022  Date of Discharge: 10/05/2022  Transitions of Care Call: 10/06/2022  Discharged from: [facility]   Discharge Diagnosis:   Hyperthyroidism Active Problems:   Multinodular goiter   Toxic multinodular goiter  Summary of Admission per MD note:  H&P: Patient is referred by Dr. 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 Patient is known to my practice having undergone previous appendectomy, cholecystectomy, and hernia repair by my partners. Patient was recently seen by Dr. Kaylyn Lim and referred over to me for thyroidectomy. Patient has approximately a 1 year history of multinodular thyroid goiter with hyperthyroidism. He was seen in December 2022 by Dr. Renato Shin and started on methimazole for hyperthyroidism. Has developed some mild compressive symptoms with intermittent hoarseness. His recent TSH level was normal at 1.13. Patient did have an ultrasound performed at the request of Dr. Loanne Drilling on December 18, 2021. This showed multiple  bilateral thyroid nodules. Fine-needle aspiration biopsy was recommended on 4 of the nodules. Does have a family history of thyroid cancer in his father who died in 4. He also has a sister who underwent thyroid surgery and his mother was on thyroid medication. Patient presents today to discuss proceeding with thyroidectomy in lieu of undergoing fine-needle aspiration biopsy. Patient is retired from [employer]. 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  Discharged Condition: good   Hospital Course: Patient was admitted for observation following total thyroidectomy.  Post op course was complicated by nausea and pain and reflux symptoms.  Pain was well controlled.  Tolerated diet.  Post op calcium level on morning following surgery was 8.7 mg/dl.  Patient was prepared for discharge home on POD#3.   Consults: None   Treatments: surgery: total thyroidectomy   Follow-Ups: Schedule an appointment with Armandina Gemma, MD (General Surgery) in 3 weeks (10/26/2022); For wound re-check  Today's visit 10/12/2022: - States feeling about the same/minimal improvement since hospital discharge. Sore throat/neck and cough (productive of yellow mucus) persisting. States "It all came back." Reports during hospital admission he had fevers which has continued now that he is home. Today's temperature 99.1 degrees fahrenheit. Taking over-the-counter Tylenol keeping temperature just about where it is. Tried tessalon perles, Bromfed, and over-the-counter cough medications without improvement. Experiencing pain but not taking Oxycodone because it causes constipation. Difficulty eating/swallowing. Has not gotten a flu/RSV test as of yet. Denies chest pain, shortness of breath, and additional red flag symptoms. Reports he sent his surgeon a message for advisement and was directed to ask primary care. He does have an appointment with General Surgery on 10/14/2022 and Endocrinology  on 11/24/2022. -  Taking blood pressure, cholesterol, and anxiety/depression medications as prescribed. Denies thoughts of self-harm, suicidal ideations, and homicidal ideations. Home blood pressures 140's/80's. Denies red flag symptoms.    Patient/Caregiver self-reported problems/concerns: see above  MEDICATIONS  Medication Reconciliation conducted with patient/caregiver? (Yes/ No): yes  New medications prescribed/discontinued upon discharge? (Yes/No):  yes  Barriers identified related to medications: no  LABS  Lab Reviewed (Yes/No/NA): yes  FOLLOW-UP (Include any further testing or referrals):  - Keep all scheduled appointments with General Surgery.  - Keep all scheduled appointments with Endocrinology.    Past Medical History:  Diagnosis Date   Anxiety    Asthma    Bilateral inguinal hernia 06/02/2021   GERD (gastroesophageal reflux disease)    History of kidney stones 2020   Hypertension    Sinus complaint 06/02/2021   x 1 week per pt   Wears glasses 06/02/2021   No Known Allergies  Current Outpatient Medications on File Prior to Visit  Medication Sig Dispense Refill   albuterol (VENTOLIN HFA) 108 (90 Base) MCG/ACT inhaler Inhale 2 puffs into the lungs every 6 (six) hours as needed for wheezing or shortness of breath. 8 g 1   AMBULATORY NON FORMULARY MEDICATION Medication Name: Nitroglycerin 0.125 %  Apply a pea sized amount to the affected area twice daily for 6 weeks. 30 g 1   atorvastatin (LIPITOR) 40 MG tablet TAKE 1 TABLET BY MOUTH EVERY DAY 90 tablet 0   BREO ELLIPTA 100-25 MCG/ACT AEPB Inhale 1 puff into the lungs daily. 3 each 3   calcium carbonate (TUMS) 500 MG chewable tablet Chew 2 tablets (400 mg of elemental calcium total) by mouth 3 (three) times daily. 90 tablet 1   Coenzyme Q10 (COQ10) 100 MG CAPS Take 100 mg by mouth in the morning.     fluticasone (FLONASE) 50 MCG/ACT nasal spray Place 2 sprays into both nostrils daily. (Patient taking differently:  Place 2 sprays into both nostrils daily as needed for allergies or rhinitis.) 16 g 0   levothyroxine (SYNTHROID) 100 MCG tablet Take 1 tablet (100 mcg total) by mouth daily before breakfast. 30 tablet 3   losartan-hydrochlorothiazide (HYZAAR) 50-12.5 MG tablet TAKE 1 TABLET BY MOUTH EVERY DAY 90 tablet 0   pantoprazole (PROTONIX) 40 MG tablet Take 1 tablet (40 mg total) by mouth daily before breakfast. 90 tablet 3   sertraline (ZOLOFT) 25 MG tablet TAKE 1 TABLET (25 MG TOTAL) BY MOUTH DAILY. 90 tablet 0   traMADol (ULTRAM) 50 MG tablet Take 1-2 tablets (50-100 mg total) by mouth every 6 (six) hours as needed for moderate pain. 15 tablet 0   No current facility-administered medications on file prior to visit.    Observations/Objective: Alert and oriented x 3. Not in acute distress. Physical examination not completed as this is a telemedicine visit.  Assessment and Plan: 1. Hospital discharge follow-up - Reviewed hospital course, current medications, ensured proper follow-up in place, and addressed concerns.   2. S/P thyroidectomy 3. Hyperthyroidism 4. Multinodular goiter 5. Toxic multinodular goiter 6. History of hernia repair - Keep appointment scheduled 10/14/2022 with Armandina Gemma, MD at [facility].  - Keep appointment scheduled 11/24/2022 with Collier Flowers, MD at Endocrinology.   7. Cough, unspecified type 8. Fever, unspecified fever cause - Discussed in detail with Dorna Mai, MD. Recommendation for patient to come to our office for respiratory panel. Patient states he plans to on tomorrow. Discussed conservative viral management. Dicussed recommendation for chest x-ray during appointment with Armandina Gemma,  MD on 10/14/2022. Informed patient to report to the emergency department if symptoms worsen and/or become severe. Patient verbalized understanding and agreement.  - Resp panel by RT-PCR (RSV, Flu A&B, Covid) Anterior Nasal Swab; Future  9. Essential (primary)  hypertension - Home blood pressures above goal. This may be due to patient not feeling well. Patient denies red flag symptoms.  - Continue Losartan-Hydrochlorothiazide as prescribed. No refills needed as of present.  - Counseled on blood pressure goal of less than 130/80, low-sodium, DASH diet, medication compliance, and 150 minutes of moderate intensity exercise per week as tolerated. Counseled on medication adherence and adverse effects. - Patient has appointment scheduled with me on 10/15/2022 for blood pressure check.   10. Hyperlipidemia, unspecified hyperlipidemia type - Continue Atorvastatin as prescribed. No refills needed as of present.  - Follow-up with primary provider as scheduled.   11. Anxiety and depression - Patient denies thoughts of self-harm, suicidal ideations, homicidal ideations. - Continue Sertraline as prescribed. No refills needed as of present.  - Follow-up with primary provider in 3 months or sooner if needed.    Follow Up Instructions: Blood pressure check on 10/15/2022 at [facility].    Patient was given clear instructions to go to Emergency Department or return to medical center if symptoms don't improve, worsen, or new problems develop.The patient verbalized understanding.  I discussed the assessment and treatment plan with the patient. The patient was provided an opportunity to ask questions and all were answered. The patient agreed with the plan and demonstrated an understanding of the instructions.   The patient was advised to call back or seek an in-person evaluation if the symptoms worsen or if the condition fails to improve as anticipated.     I provided 21 minutes total of non-face-to-face time during this encounter.   Camillia Herter, NP  [facility] [place], [place] 10/12/2022, 3:27 PM

## 2022-10-09 NOTE — Progress Notes (Unsigned)
Patient ID: Gregg [name], male    DOB: September 06, 1956  MRN: [id number]  CC: No chief complaint on file.   Subjective: Gregg [name] is a 66 y.o. male who presents for  His concerns today include:  Last appt 10/12/2022 for hospital discharge follow-up   Patient Active Problem List   Diagnosis Date Noted   Toxic multinodular goiter 10/02/2022   Mild persistent asthma 07/10/2022   Former smoker 07/10/2022   Hyperthyroidism 01/13/2022   Symptomatic cholelithiasis 01/08/2022   S/P laparoscopic cholecystectomy 01/08/2022   Adenomatous polyp of transverse colon    Diverticulosis of colon without hemorrhage    Grade II internal hemorrhoids    Multinodular goiter 12/01/2021   Hyperlipidemia 09/11/2021   Essential hypertension 07/22/2021   Anxiety and depression 07/02/2021   History of appendicitis 07/01/2021   Acute phlegmonous appendicitis s/p lap appendectomy 03/07/2021 03/07/2021   Kidney stone 03/07/2021   Bilateral inguinal hernia (BIH) s/p lap repair with mesh 06/06/2021 03/07/2021     Current Outpatient Medications on File Prior to Visit  Medication Sig Dispense Refill   albuterol (VENTOLIN HFA) 108 (90 Base) MCG/ACT inhaler Inhale 2 puffs into the lungs every 6 (six) hours as needed for wheezing or shortness of breath. 8 g 1   AMBULATORY NON FORMULARY MEDICATION Medication Name: Nitroglycerin 0.125 %  Apply a pea sized amount to the affected area twice daily for 6 weeks. 30 g 1   atorvastatin (LIPITOR) 40 MG tablet TAKE 1 TABLET BY MOUTH EVERY DAY 90 tablet 0   BREO ELLIPTA 100-25 MCG/ACT AEPB Inhale 1 puff into the lungs daily. 3 each 3   calcium carbonate (TUMS) 500 MG chewable tablet Chew 2 tablets (400 mg of elemental calcium total) by mouth 3 (three) times daily. 90 tablet 1   Coenzyme Q10 (COQ10) 100 MG CAPS Take 100 mg by mouth in the morning.     fluticasone (FLONASE) 50 MCG/ACT nasal spray Place 2 sprays into both nostrils daily. (Patient taking differently:  Place 2 sprays into both nostrils daily as needed for allergies or rhinitis.) 16 g 0   levothyroxine (SYNTHROID) 100 MCG tablet Take 1 tablet (100 mcg total) by mouth daily before breakfast. 30 tablet 3   losartan-hydrochlorothiazide (HYZAAR) 50-12.5 MG tablet TAKE 1 TABLET BY MOUTH EVERY DAY 90 tablet 0   pantoprazole (PROTONIX) 40 MG tablet Take 1 tablet (40 mg total) by mouth daily before breakfast. 90 tablet 3   sertraline (ZOLOFT) 25 MG tablet TAKE 1 TABLET (25 MG TOTAL) BY MOUTH DAILY. 90 tablet 0   traMADol (ULTRAM) 50 MG tablet Take 1-2 tablets (50-100 mg total) by mouth every 6 (six) hours as needed for moderate pain. 15 tablet 0   No current facility-administered medications on file prior to visit.    No Known Allergies  Social History   Socioeconomic History   Marital status: Single    Spouse name: Not on file   Number of children: Not on file   Years of education: Not on file   Highest education level: Not on file  Occupational History   Not on file  Tobacco Use   Smoking status: Former    Packs/day: 0.50    Years: 40.00    Total pack years: 20.00    Types: Cigarettes    Quit date: 03/29/2017    Years since quitting: 5.5    Passive exposure: Past   Smokeless tobacco: Never  Vaping Use   Vaping Use: Never used  Substance and  Sexual Activity   Alcohol use: Yes    Comment: rare   Drug use: Never   Sexual activity: Not on file  Other Topics Concern   Not on file  Social History Narrative   Not on file   Social Determinants of Health   Financial Resource Strain: Not on file  Food Insecurity: No Food Insecurity (10/02/2022)   Hunger Vital Sign    Worried About Running Out of Food in the Last Year: Never true    Ran Out of Food in the Last Year: Never true  Transportation Needs: No Transportation Needs (10/02/2022)   PRAPARE - Hydrologist (Medical): No    Lack of Transportation (Non-Medical): No  Physical Activity: Not on file   Stress: Not on file  Social Connections: Not on file  Intimate Partner Violence: Not At Risk (10/02/2022)   Humiliation, Afraid, Rape, and Kick questionnaire    Fear of Current or Ex-Partner: No    Emotionally Abused: No    Physically Abused: No    Sexually Abused: No    Family History  Problem Relation Age of Onset   Thyroid disease Mother    Thyroid disease Father    Thyroid disease Sister    Stomach cancer Neg Hx    Colon cancer Neg Hx    Esophageal cancer Neg Hx    Rectal cancer Neg Hx     Past Surgical History:  Procedure Laterality Date   CHOLECYSTECTOMY     COLONOSCOPY WITH PROPOFOL N/A 12/31/2021   Procedure: COLONOSCOPY WITH PROPOFOL;  Surgeon: Lavena Bullion, DO;  Location: [facility];  Service: Gastroenterology;  Laterality: N/A;   ENDOSCOPIC MUCOSAL RESECTION N/A 12/31/2021   Procedure: ENDOSCOPIC MUCOSAL RESECTION;  Surgeon: Lavena Bullion, DO;  Location: [facility];  Service: Gastroenterology;  Laterality: N/A;   EXTRACORPOREAL SHOCK WAVE LITHOTRIPSY Left 02/23/2019   Procedure: EXTRACORPOREAL SHOCK WAVE LITHOTRIPSY (ESWL);  Surgeon: Bjorn Loser, MD;  Location: [facility];  Service: Urology;  Laterality: Left;   HEMOSTASIS CLIP PLACEMENT  12/31/2021   Procedure: HEMOSTASIS CLIP PLACEMENT;  Surgeon: Lavena Bullion, DO;  Location: [facility];  Service: Gastroenterology;;   INGUINAL HERNIA REPAIR Bilateral 06/06/2021   Procedure: LAPAROSCOPIC BILATERAL INGUINAL HERNIA REPAIRS WITH MESH;  Surgeon: Michael Boston, MD;  Location: [facility];  Service: General;  Laterality: Bilateral;  TAP BLOCK   LAPAROSCOPIC APPENDECTOMY N/A 03/07/2021   Procedure: APPENDECTOMY LAPAROSCOPIC;  Surgeon: Michael Boston, MD;  Location: [facility];  Service: General;  Laterality: N/A;   SUBMUCOSAL LIFTING INJECTION  12/31/2021   Procedure: SUBMUCOSAL LIFTING INJECTION;  Surgeon: Lavena Bullion, DO;  Location: [facility];  Service: Gastroenterology;;    THYROIDECTOMY N/A 10/02/2022   Procedure: TOTAL THYROIDECTOMY;  Surgeon: Armandina Gemma, MD;  Location: [facility];  Service: General;  Laterality: N/A;    ROS: Review of Systems Negative except as stated above  PHYSICAL EXAM: There were no vitals taken for this visit.  Physical Exam  {male adult master:310786} {male adult master:310785}     Latest Ref Rng & Units 10/05/2022    3:50 AM 10/03/2022    4:45 AM 09/24/2022    1:26 PM  CMP  Glucose 70 - 99 mg/dL 101  144  94   BUN 8 - 23 mg/dL '17  19  19   '$ Creatinine 0.61 - 1.24 mg/dL 0.96  1.09  0.95   Sodium 135 - 145 mmol/L 134  137  138   Potassium 3.5 -  5.1 mmol/L 3.3  3.7  3.7   Chloride 98 - 111 mmol/L 96  99  103   CO2 22 - 32 mmol/L '31  31  26   '$ Calcium 8.9 - 10.3 mg/dL 8.8  9.3  9.9    Lipid Panel     Component Value Date/Time   CHOL 138 10/21/2021 0932   TRIG 139 10/21/2021 0932   HDL 33 (L) 10/21/2021 0932   CHOLHDL 4.2 10/21/2021 0932   LDLCALC 80 10/21/2021 0932    CBC    Component Value Date/Time   WBC 7.2 09/24/2022 1326   RBC 5.19 09/24/2022 1326   HGB 16.5 09/24/2022 1326   HGB 17.3 09/09/2021 1141   HCT 48.9 09/24/2022 1326   HCT 50.1 09/09/2021 1141   PLT 247 09/24/2022 1326   PLT 228 09/09/2021 1141   MCV 94.2 09/24/2022 1326   MCV 91 09/09/2021 1141   MCH 31.8 09/24/2022 1326   MCHC 33.7 09/24/2022 1326   RDW 12.2 09/24/2022 1326   RDW 12.8 09/09/2021 1141   LYMPHSABS 1.4 04/16/2022 1020   MONOABS 0.6 04/16/2022 1020   EOSABS 0.2 04/16/2022 1020   BASOSABS 0.1 04/16/2022 1020    ASSESSMENT AND PLAN:  There are no diagnoses linked to this encounter.   Patient was given the opportunity to ask questions.  Patient verbalized understanding of the plan and was able to repeat key elements of the plan. Patient was given clear instructions to go to Emergency Department or return to medical center if symptoms don't improve, worsen, or new problems develop.The patient verbalized  understanding.   No orders of the defined types were placed in this encounter.    Requested Prescriptions    No prescriptions requested or ordered in this encounter    No follow-ups on file.  Camillia Herter, NP

## 2022-10-12 ENCOUNTER — Ambulatory Visit (INDEPENDENT_AMBULATORY_CARE_PROVIDER_SITE_OTHER): Payer: BC Managed Care – PPO | Admitting: Family

## 2022-10-12 DIAGNOSIS — E0590 Thyrotoxicosis, unspecified without thyrotoxic crisis or storm: Secondary | ICD-10-CM

## 2022-10-12 DIAGNOSIS — Z9889 Other specified postprocedural states: Secondary | ICD-10-CM

## 2022-10-12 DIAGNOSIS — R509 Fever, unspecified: Secondary | ICD-10-CM | POA: Diagnosis not present

## 2022-10-12 DIAGNOSIS — E0520 Thyrotoxicosis with toxic multinodular goiter without thyrotoxic crisis or storm: Secondary | ICD-10-CM

## 2022-10-12 DIAGNOSIS — Z09 Encounter for follow-up examination after completed treatment for conditions other than malignant neoplasm: Secondary | ICD-10-CM

## 2022-10-12 DIAGNOSIS — R059 Cough, unspecified: Secondary | ICD-10-CM

## 2022-10-12 DIAGNOSIS — E042 Nontoxic multinodular goiter: Secondary | ICD-10-CM

## 2022-10-12 DIAGNOSIS — E890 Postprocedural hypothyroidism: Secondary | ICD-10-CM | POA: Diagnosis not present

## 2022-10-12 DIAGNOSIS — Z8719 Personal history of other diseases of the digestive system: Secondary | ICD-10-CM

## 2022-10-12 DIAGNOSIS — F419 Anxiety disorder, unspecified: Secondary | ICD-10-CM

## 2022-10-12 DIAGNOSIS — F32A Depression, unspecified: Secondary | ICD-10-CM

## 2022-10-12 DIAGNOSIS — I10 Essential (primary) hypertension: Secondary | ICD-10-CM

## 2022-10-12 DIAGNOSIS — E785 Hyperlipidemia, unspecified: Secondary | ICD-10-CM

## 2022-10-12 LAB — SURGICAL PATHOLOGY

## 2022-10-12 NOTE — Progress Notes (Signed)
Good news!  Pathology is all benign.  Will have a copy of the report for the patient at post op visit.  tmg  Armandina Gemma, [facility] [place] practice Office: [phone]

## 2022-10-14 DIAGNOSIS — E890 Postprocedural hypothyroidism: Secondary | ICD-10-CM | POA: Insufficient documentation

## 2022-10-15 ENCOUNTER — Ambulatory Visit (INDEPENDENT_AMBULATORY_CARE_PROVIDER_SITE_OTHER): Payer: BC Managed Care – PPO | Admitting: Family

## 2022-10-15 ENCOUNTER — Other Ambulatory Visit (HOSPITAL_COMMUNITY)
Admission: RE | Admit: 2022-10-15 | Discharge: 2022-10-15 | Disposition: A | Discharge status: Home / Self Care | Payer: BC Managed Care – PPO | Attending: Family | Admitting: Family

## 2022-10-15 VITALS — BP 138/78 | HR 103 | Temp 98.3°F | Resp 16 | Wt 178.0 lb

## 2022-10-15 DIAGNOSIS — I10 Essential (primary) hypertension: Secondary | ICD-10-CM

## 2022-10-15 DIAGNOSIS — J3089 Other allergic rhinitis: Secondary | ICD-10-CM | POA: Diagnosis not present

## 2022-10-15 DIAGNOSIS — Z1152 Encounter for screening for COVID-19: Secondary | ICD-10-CM | POA: Insufficient documentation

## 2022-10-15 DIAGNOSIS — R059 Cough, unspecified: Secondary | ICD-10-CM

## 2022-10-15 LAB — RESP PANEL BY RT-PCR (RSV, FLU A&B, COVID)  RVPGX2
Influenza A by PCR: NEGATIVE
Influenza B by PCR: NEGATIVE
Resp Syncytial Virus by PCR: NEGATIVE
SARS Coronavirus 2 by RT PCR: NEGATIVE

## 2022-10-15 MED ORDER — LOSARTAN POTASSIUM-HCTZ 50-12.5 MG PO TABS: 1.0000 | ORAL_TABLET | Freq: Every day | ORAL | 2 refills | Status: DC

## 2022-10-15 NOTE — Progress Notes (Signed)
Pt presents for follow-up from 10/23 video visit -nasal swab completed

## 2022-10-16 ENCOUNTER — Ambulatory Visit: Payer: BC Managed Care – PPO | Admitting: Gastroenterology

## 2022-10-29 ENCOUNTER — Encounter: Payer: Self-pay | Admitting: Internal Medicine

## 2022-10-30 ENCOUNTER — Other Ambulatory Visit: Payer: Self-pay | Admitting: Internal Medicine

## 2022-10-30 ENCOUNTER — Other Ambulatory Visit (INDEPENDENT_AMBULATORY_CARE_PROVIDER_SITE_OTHER): Payer: 59

## 2022-10-30 DIAGNOSIS — E890 Postprocedural hypothyroidism: Secondary | ICD-10-CM

## 2022-10-30 LAB — T4, FREE: Free T4: 1.01 ng/dL (ref 0.60–1.60)

## 2022-10-30 LAB — TSH: TSH: 6.64 u[IU]/mL — ABNORMAL HIGH (ref 0.35–5.50)

## 2022-10-30 MED ORDER — LEVOTHYROXINE SODIUM 125 MCG PO TABS: 125.0000 ug | ORAL_TABLET | Freq: Every day | ORAL | 3 refills | Status: DC

## 2022-11-11 ENCOUNTER — Encounter: Payer: Self-pay | Admitting: Internal Medicine

## 2022-11-24 ENCOUNTER — Ambulatory Visit (INDEPENDENT_AMBULATORY_CARE_PROVIDER_SITE_OTHER): Payer: 59 | Admitting: Internal Medicine

## 2022-11-24 ENCOUNTER — Encounter: Payer: Self-pay | Admitting: Internal Medicine

## 2022-11-24 VITALS — BP 126/74 | HR 66 | Ht 69.0 in | Wt 185.0 lb

## 2022-11-24 DIAGNOSIS — E890 Postprocedural hypothyroidism: Secondary | ICD-10-CM

## 2022-11-24 NOTE — Patient Instructions (Signed)

## 2022-11-24 NOTE — Progress Notes (Signed)
Name: Gregg [name]  MRN/ DOB: [id number], 10/12/1956    Age/ Sex: 66 y.o., male     PCP: Camillia Herter, NP   Reason for Endocrinology Evaluation: Hyperthyroidism     Initial Endocrinology Clinic Visit: 12/01/2021    PATIENT IDENTIFIER: [name] is a 66 y.o., male with a past medical history of MNG. He has followed with [facility] since 12/01/2021 for consultative assistance with management of his hyperthyroidism.   HISTORICAL SUMMARY: The patient was first diagnosed with  hyperthyroidism in 2022.   Thyroid ultrasound showed multiple nodules in 11/2021 with multiple nodules meeting FNA criteria, but the pt declines   Pt was started on Methimazole   Pt with FH of thyroid disease    Patient opted to proceed with total thyroidectomy due to multinodular goiter, which was done on 10/02/2022 with benign pathology report   SUBJECTIVE:     Today (11/24/2022):  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  Has hoarseness , has an appointment with surgeon tomorrow  No perioral tingling or spasms   Levothyroxine 100 mcg, takes 1.5 tabs daily    HISTORY:  Past Medical History:  Past Medical History:  Diagnosis Date   Anxiety    Asthma    Bilateral inguinal hernia 06/02/2021   GERD (gastroesophageal reflux disease)    History of kidney stones 2020   Hypertension    Sinus complaint 06/02/2021   x 1 week per pt   Wears glasses 06/02/2021   Past Surgical History:  Past Surgical History:  Procedure Laterality Date   CHOLECYSTECTOMY     COLONOSCOPY WITH PROPOFOL N/A 12/31/2021   Procedure: COLONOSCOPY WITH  PROPOFOL;  Surgeon: Lavena Bullion, DO;  Location: [facility];  Service: Gastroenterology;  Laterality: N/A;   ENDOSCOPIC MUCOSAL RESECTION N/A 12/31/2021   Procedure: ENDOSCOPIC MUCOSAL RESECTION;  Surgeon: Lavena Bullion, DO;  Location: [facility];  Service: Gastroenterology;  Laterality: N/A;   EXTRACORPOREAL SHOCK WAVE LITHOTRIPSY Left 02/23/2019   Procedure: EXTRACORPOREAL SHOCK WAVE LITHOTRIPSY (ESWL);  Surgeon: Bjorn Loser, MD;  Location: [facility];  Service: Urology;  Laterality: Left;   HEMOSTASIS CLIP PLACEMENT  12/31/2021   Procedure: HEMOSTASIS CLIP PLACEMENT;  Surgeon: Lavena Bullion, DO;  Location[facility];  Service: Gastroenterology;;   INGUINAL HERNIA REPAIR Bilateral 06/06/2021   Procedure: LAPAROSCOPIC BILATERAL INGUINAL HERNIA REPAIRS WITH MESH;  Surgeon: Michael Boston, MD;  Location: [facility];  Service: General;  Laterality: Bilateral;  TAP BLOCK   LAPAROSCOPIC APPENDECTOMY N/A 03/07/2021   Procedure: APPENDECTOMY LAPAROSCOPIC;  Surgeon: Michael Boston, MD;  [facility];  Service: General;  Laterality: N/A;   SUBMUCOSAL LIFTING INJECTION  12/31/2021   Procedure: SUBMUCOSAL LIFTING INJECTION;  Surgeon: Lavena Bullion, DO;  [facility];  Service: Gastroenterology;;   THYROIDECTOMY N/A 10/02/2022   Procedure: TOTAL THYROIDECTOMY;  Surgeon: Armandina Gemma, MD;  Location[facility];  Service: General;  Laterality: N/A;   Social History:  reports that he quit smoking about 5 years ago. His smoking use included cigarettes. He has a 20.00 pack-year smoking history. He has been exposed to tobacco smoke. He has never used  smokeless tobacco. He reports current alcohol use. He reports that he does not use drugs. Family History:  Family History  Problem Relation Age of Onset   Thyroid disease Mother    Thyroid disease Father    Thyroid disease Sister    Stomach cancer Neg Hx    Colon cancer Neg Hx    Esophageal cancer Neg Hx     Rectal cancer Neg Hx      HOME MEDICATIONS: Allergies as of 11/24/2022   No Known Allergies      Medication List        Accurate as of November 24, 2022  8:26 AM. If you have any questions, ask your nurse or doctor.          STOP taking these medications    AMBULATORY NON FORMULARY MEDICATION Stopped by: Dorita Sciara, MD   calcium carbonate 500 MG chewable tablet Commonly known as: Tums Stopped by: Dorita Sciara, MD   levothyroxine 125 MCG tablet Commonly known as: SYNTHROID Stopped by: Dorita Sciara, MD   traMADol 50 MG tablet Commonly known as: ULTRAM Stopped by: Dorita Sciara, MD       TAKE these medications    albuterol 108 (90 Base) MCG/ACT inhaler Commonly known as: VENTOLIN HFA Inhale 2 puffs into the lungs every 6 (six) hours as needed for wheezing or shortness of breath.   atorvastatin 40 MG tablet Commonly known as: LIPITOR TAKE 1 TABLET BY MOUTH EVERY DAY   Breo Ellipta 100-25 MCG/ACT Aepb Generic drug: fluticasone furoate-vilanterol Inhale 1 puff into the lungs daily.   CoQ10 100 MG Caps Take 100 mg by mouth in the morning.   fluticasone 50 MCG/ACT nasal spray Commonly known as: FLONASE Place 2 sprays into both nostrils daily. What changed:  when to take this reasons to take this   losartan-hydrochlorothiazide 50-12.5 MG tablet Commonly known as: HYZAAR Take 1 tablet by mouth daily.   pantoprazole 40 MG tablet Commonly known as: PROTONIX Take 1 tablet (40 mg total) by mouth daily before breakfast.   sertraline 25 MG tablet Commonly known as: ZOLOFT TAKE 1 TABLET (25 MG TOTAL) BY MOUTH DAILY.          OBJECTIVE:   PHYSICAL EXAM: VS: BP 126/74 (BP Location: Left Arm, Patient Position: Sitting, Cuff Size: Small)   Pulse 66   Ht '5\' 9"'$  (1.753 m)   Wt 185 lb (83.9 kg)   SpO2 97%   BMI 27.32 kg/m    EXAM: General: Pt appears well and is in NAD  Neck: General: Supple without  adenopathy. Thyroid: incision clean   Lungs: Clear with good BS bilat with no rales, rhonchi, or wheezes  Heart: Auscultation: RRR.  Extremities:  BL LE: No pretibial edema normal ROM and strength.  Mental Status: Judgment, insight: Intact Orientation: Oriented to time, place, and person Mood and affect: No depression, anxiety, or agitation     DATA REVIEWED:    Latest Reference Range & Units 10/30/22 11:22  TSH 0.35 - 5.50 uIU/mL 6.64 (H)  T4,Free(Direct) 0.60 - 1.60 ng/dL 1.01    Thyroid Pathology 10/02/2022   FINAL MICROSCOPIC DIAGNOSIS:   A. THYROID, TOTAL THYROIDECTOMY:  - ADENOMATOID NODULAR HYPERPLASIA (SEE COMMENT).  - NEGATIVE FOR MALIGNANCY.   ASSESSMENT / PLAN / RECOMMENDATIONS:   Postoperative Hypothyroidism  - He has felt better since being on levothyroxine 150 mcg. He has plenty of levothyroxine 100 mcg and has been using 1.5 tabs  - - Pt educated  extensively on the correct way to take levothyroxine (first thing in the morning with water, 30 minutes before eating or taking other medications). - Pt encouraged to double dose the following day if she were to miss a dose given long half-life of levothyroxine.  - Its too soon to check TSH at this time, I did explain to the pt we need to wait a minimum 6 weeks from any LT-4  changes  - He will return for TSH check the first week of January    Medication  Continue Levothyroxine 100 mcg , 1.5 tab daily   Follow-up in 4 months Labs in 4 weeks   Signed electronically by: Mack Guise, MD  [facility]  [address] [address] [place], [place] [address] Phone: [phone] FAX: [phone]      CC: Camillia Herter, NP [address] [place] [address] Phone: [phone]  Fax: [phone]   Return to Endocrinology clinic as below: Future Appointments  Date Time Provider [place]  01/08/2023  8:10 AM Tracia Lacomb, Melanie Crazier, MD [facility] None  01/13/2023  8:20 AM Camillia Herter, NP [facility] None

## 2022-11-27 ENCOUNTER — Other Ambulatory Visit: Payer: Self-pay | Admitting: Family

## 2022-11-27 DIAGNOSIS — F419 Anxiety disorder, unspecified: Secondary | ICD-10-CM

## 2022-12-17 ENCOUNTER — Other Ambulatory Visit: Payer: BC Managed Care – PPO

## 2022-12-20 ENCOUNTER — Other Ambulatory Visit: Payer: Self-pay | Admitting: Family

## 2022-12-20 DIAGNOSIS — E785 Hyperlipidemia, unspecified: Secondary | ICD-10-CM

## 2022-12-22 ENCOUNTER — Other Ambulatory Visit: Payer: 59

## 2022-12-22 NOTE — Telephone Encounter (Signed)
Requested medication (s) are due for refill today:   Yes  Requested medication (s) are on the active medication list:   Yes  Future visit scheduled:   Yes 01/13/2023 with Amy   Last ordered: 09/24/2022 #90, 0 refills  Returned because labs are overdue.    Provider to review for refills prior to upcoming appt.     Requested Prescriptions  Pending Prescriptions Disp Refills   atorvastatin (LIPITOR) 40 MG tablet [Pharmacy Med Name: ATORVASTATIN 40 MG TABLET] 90 tablet 0    Sig: TAKE 1 TABLET BY MOUTH EVERY DAY     Cardiovascular:  Antilipid - Statins Failed - 12/20/2022  9:23 AM      Failed - Lipid Panel in normal range within the last 12 months    Cholesterol, Total  Date Value Ref Range Status  10/21/2021 138 100 - 199 mg/dL Final   LDL Chol Calc (NIH)  Date Value Ref Range Status  10/21/2021 80 0 - 99 mg/dL Final   HDL  Date Value Ref Range Status  10/21/2021 33 (L) >39 mg/dL Final   Triglycerides  Date Value Ref Range Status  10/21/2021 139 0 - 149 mg/dL Final         Passed - Patient is not pregnant      Passed - Valid encounter within last 12 months    Recent Outpatient Visits           2 months ago Primary hypertension   Primary Care at [facility], [place], NP   2 months ago Hospital discharge follow-up   Primary Care at [facility], Amy J, NP   5 months ago Essential (primary) hypertension   Primary Care at [facility], Amy J, NP   9 months ago Essential (primary) hypertension   Primary Care at [facility], [place], NP   1 year ago Essential hypertension   Primary Care at [facility], Flonnie Hail[place], NP       Future Appointments             In 3 weeks Camillia Herter, NP Primary Care at [facility]

## 2022-12-23 ENCOUNTER — Other Ambulatory Visit (INDEPENDENT_AMBULATORY_CARE_PROVIDER_SITE_OTHER): Payer: 59

## 2022-12-23 DIAGNOSIS — E890 Postprocedural hypothyroidism: Secondary | ICD-10-CM | POA: Diagnosis not present

## 2022-12-23 LAB — TSH: TSH: 0.09 u[IU]/mL — ABNORMAL LOW (ref 0.35–5.50)

## 2022-12-23 MED ORDER — LEVOTHYROXINE SODIUM 125 MCG PO TABS: 125.0000 ug | ORAL_TABLET | Freq: Every day | ORAL | 3 refills | Status: DC

## 2022-12-30 ENCOUNTER — Telehealth: Payer: Self-pay | Admitting: Internal Medicine

## 2022-12-30 DIAGNOSIS — E890 Postprocedural hypothyroidism: Secondary | ICD-10-CM

## 2022-12-30 NOTE — Telephone Encounter (Signed)
Called patient to cancel duplicated appointment and he asked if he should do labs between now and his April 2024 visit . He had labs and change of medication dosage 12/23/22.  Call back number regarding lab appointment - [phone]

## 2022-12-31 ENCOUNTER — Telehealth: Payer: Self-pay | Admitting: Internal Medicine

## 2022-12-31 NOTE — Telephone Encounter (Signed)
Patient was called to schedule his 6 week follow up lab appointment and he stated that he had a question about the  levothyroxine levothyroxine (SYNTHROID) 125 MCG tablet.  Patient states that since he started the new dosage for Levothyroxine that at times during this week he has felt that his heart is racing.  He states that most mornings his pulse is elevated (he did not have any readings).  HE says that a few times in the afternoon when he has felt his heart racing that he has taken his blood pressure and it is fine (around 122/79).  Patient would like to discuss what is going on since dosage change.  He is just not feeling well.

## 2022-12-31 NOTE — Telephone Encounter (Signed)
Patient is scheduled for labs on 02/11/2023 at 8:15 AM.

## 2022-12-31 NOTE — Telephone Encounter (Signed)
Pt contacted and advised  The symptoms are normal because he was on too much levothyroxine, we just recently adjusted his medication so it will take 6 weeks for his symptoms and his levels to steady out.  This is why it is important for him to follow the instructions, and not self adjust his medication  Pt verbalized understanding and will continue on current dose.

## 2023-01-08 ENCOUNTER — Ambulatory Visit: Payer: BC Managed Care – PPO | Admitting: Internal Medicine

## 2023-01-12 NOTE — Progress Notes (Unsigned)
Patient ID: Gregg [name], male    DOB: 05-21-1956  MRN: [id number]  CC: Chronic Care Management   Subjective: Gregg [name] is a 67 y.o. male who presents for chronic care management.   His concerns today include:  Endo - hyperthyroidism, thyroidectomy, goiter HTN - Losartan-HCTZ HLD - Atorvastatin  Anxiety depression - Sertraline   Patient Active Problem List   Diagnosis Date Noted   Toxic multinodular goiter 10/02/2022   Mild persistent asthma 07/10/2022   Former smoker 07/10/2022   Hyperthyroidism 01/13/2022   Symptomatic cholelithiasis 01/08/2022   S/P laparoscopic cholecystectomy 01/08/2022   Adenomatous polyp of transverse colon    Diverticulosis of colon without hemorrhage    Grade II internal hemorrhoids    Multinodular goiter 12/01/2021   Hyperlipidemia 09/11/2021   Essential hypertension 07/22/2021   Anxiety and depression 07/02/2021   History of appendicitis 07/01/2021   Acute phlegmonous appendicitis s/p lap appendectomy 03/07/2021 03/07/2021   Kidney stone 03/07/2021   Bilateral inguinal hernia (BIH) s/p lap repair with mesh 06/06/2021 03/07/2021     Current Outpatient Medications on File Prior to Visit  Medication Sig Dispense Refill   levothyroxine (SYNTHROID) 125 MCG tablet Take 1 tablet (125 mcg total) by mouth daily. 90 tablet 3   albuterol (VENTOLIN HFA) 108 (90 Base) MCG/ACT inhaler Inhale 2 puffs into the lungs every 6 (six) hours as needed for wheezing or shortness of breath. 8 g 1   atorvastatin (LIPITOR) 40 MG tablet TAKE 1 TABLET BY MOUTH EVERY DAY 90 tablet 0   BREO ELLIPTA 100-25 MCG/ACT AEPB Inhale 1 puff into the lungs daily. 3 each 3   Coenzyme Q10 (COQ10) 100 MG CAPS Take 100 mg by mouth in the morning.     fluticasone (FLONASE) 50 MCG/ACT nasal spray Place 2 sprays into both nostrils daily. (Patient taking differently: Place 2 sprays into both nostrils daily as needed for allergies or rhinitis.) 16 g 0   losartan-hydrochlorothiazide  (HYZAAR) 50-12.5 MG tablet Take 1 tablet by mouth daily. 30 tablet 2   pantoprazole (PROTONIX) 40 MG tablet Take 1 tablet (40 mg total) by mouth daily before breakfast. 90 tablet 3   sertraline (ZOLOFT) 25 MG tablet TAKE 1 TABLET (25 MG TOTAL) BY MOUTH DAILY. 90 tablet 0   No current facility-administered medications on file prior to visit.    No Known Allergies  Social History   Socioeconomic History   Marital status: Single    Spouse name: Not on file   Number of children: Not on file   Years of education: Not on file   Highest education level: Not on file  Occupational History   Not on file  Tobacco Use   Smoking status: Former    Packs/day: 0.50    Years: 40.00    Total pack years: 20.00    Types: Cigarettes    Quit date: 03/29/2017    Years since quitting: 5.7    Passive exposure: Past   Smokeless tobacco: Never  Vaping Use   Vaping Use: Never used  Substance and Sexual Activity   Alcohol use: Yes    Comment: rare   Drug use: Never   Sexual activity: Not on file  Other Topics Concern   Not on file  Social History Narrative   Not on file   Social Determinants of Health   Financial Resource Strain: Not on file  Food Insecurity: No Food Insecurity (10/02/2022)   Hunger Vital Sign    Worried About Running Out  of Food in the Last Year: Never true    [place] in the Last Year: Never true  Transportation Needs: No Transportation Needs (10/02/2022)   PRAPARE - Hydrologist (Medical): No    Lack of Transportation (Non-Medical): No  Physical Activity: Not on file  Stress: Not on file  Social Connections: Not on file  Intimate Partner Violence: Not At Risk (10/02/2022)   Humiliation, Afraid, Rape, and Kick questionnaire    Fear of Current or Ex-Partner: No    Emotionally Abused: No    Physically Abused: No    Sexually Abused: No    Family History  Problem Relation Age of Onset   Thyroid disease Mother    Thyroid disease  Father    Thyroid disease Sister    Stomach cancer Neg Hx    Colon cancer Neg Hx    Esophageal cancer Neg Hx    Rectal cancer Neg Hx     Past Surgical History:  Procedure Laterality Date   CHOLECYSTECTOMY     COLONOSCOPY WITH PROPOFOL N/A 12/31/2021   Procedure: COLONOSCOPY WITH PROPOFOL;  Surgeon: Lavena Bullion, DO;  [facility];  Service: Gastroenterology;  Laterality: N/A;   ENDOSCOPIC MUCOSAL RESECTION N/A 12/31/2021   Procedure: ENDOSCOPIC MUCOSAL RESECTION;  Surgeon: Lavena Bullion, DO;  [facility];  Service: Gastroenterology;  Laterality: N/A;   EXTRACORPOREAL [name] WAVE LITHOTRIPSY Left 02/23/2019   Procedure: EXTRACORPOREAL [name] WAVE LITHOTRIPSY (ESWL);  Surgeon: Bjorn Loser, MD;  [facility];  Service: Urology;  Laterality: Left;   HEMOSTASIS CLIP PLACEMENT  12/31/2021   Procedure: HEMOSTASIS CLIP PLACEMENT;  Surgeon: Lavena Bullion, DO;  [facility];  Service: Gastroenterology;;   INGUINAL HERNIA REPAIR Bilateral 06/06/2021   Procedure: LAPAROSCOPIC BILATERAL INGUINAL HERNIA REPAIRS WITH MESH;  Surgeon: Michael Boston, MD;  Location: [facility];  Service: General;  Laterality: Bilateral;  TAP BLOCK   LAPAROSCOPIC APPENDECTOMY N/A 03/07/2021   Procedure: APPENDECTOMY LAPAROSCOPIC;  Surgeon: Michael Boston, MD;  [facility];  Service: General;  Laterality: N/A;   SUBMUCOSAL LIFTING INJECTION  12/31/2021   Procedure: SUBMUCOSAL LIFTING INJECTION;  Surgeon: Lavena Bullion, DO;  [facility];  Service: Gastroenterology;;   THYROIDECTOMY N/A 10/02/2022   Procedure: TOTAL THYROIDECTOMY;  Surgeon: Armandina Gemma, MD;  [facility];  Service: General;  Laterality: N/A;    ROS: Review of Systems Negative except as stated above  PHYSICAL EXAM: There were no vitals taken for this visit.  Physical Exam  {male adult master:310786} {male adult master:310785}     Latest Ref Rng & Units  10/05/2022    3:50 AM 10/03/2022    4:45 AM 09/24/2022    1:26 PM  CMP  Glucose 70 - 99 mg/dL 101  144  94   BUN 8 - 23 mg/dL '17  19  19   '$ Creatinine 0.61 - 1.24 mg/dL 0.96  1.09  0.95   Sodium 135 - 145 mmol/L 134  137  138   Potassium 3.5 - 5.1 mmol/L 3.3  3.7  3.7   Chloride 98 - 111 mmol/L 96  99  103   CO2 22 - 32 mmol/L '31  31  26   '$ Calcium 8.9 - 10.3 mg/dL 8.8  9.3  9.9    Lipid Panel     Component Value Date/Time   CHOL 138 10/21/2021 0932   TRIG 139 10/21/2021 0932   HDL 33 (L) 10/21/2021 0932  CHOLHDL 4.2 10/21/2021 0932   LDLCALC 80 10/21/2021 0932    CBC    Component Value Date/Time   WBC 7.2 09/24/2022 1326   RBC 5.19 09/24/2022 1326   HGB 16.5 09/24/2022 1326   HGB 17.3 09/09/2021 1141   HCT 48.9 09/24/2022 1326   HCT 50.1 09/09/2021 1141   PLT 247 09/24/2022 1326   PLT 228 09/09/2021 1141   MCV 94.2 09/24/2022 1326   MCV 91 09/09/2021 1141   MCH 31.8 09/24/2022 1326   MCHC 33.7 09/24/2022 1326   RDW 12.2 09/24/2022 1326   RDW 12.8 09/09/2021 1141   LYMPHSABS 1.4 04/16/2022 1020   MONOABS 0.6 04/16/2022 1020   EOSABS 0.2 04/16/2022 1020   BASOSABS 0.1 04/16/2022 1020    ASSESSMENT AND PLAN:  There are no diagnoses linked to this encounter.   Patient was given the opportunity to ask questions.  Patient verbalized understanding of the plan and was able to repeat key elements of the plan. Patient was given clear instructions to go to Emergency Department or return to medical center if symptoms don't improve, worsen, or new problems develop.The patient verbalized understanding.   No orders of the defined types were placed in this encounter.    Requested Prescriptions    No prescriptions requested or ordered in this encounter    No follow-ups on file.  Camillia Herter, NP

## 2023-01-13 ENCOUNTER — Encounter: Payer: Self-pay | Admitting: Family

## 2023-01-13 ENCOUNTER — Ambulatory Visit (INDEPENDENT_AMBULATORY_CARE_PROVIDER_SITE_OTHER): Payer: 59 | Admitting: Family

## 2023-01-13 VITALS — BP 134/86 | HR 81 | Temp 98.3°F | Resp 16 | Ht 69.02 in | Wt 181.0 lb

## 2023-01-13 DIAGNOSIS — E785 Hyperlipidemia, unspecified: Secondary | ICD-10-CM

## 2023-01-13 DIAGNOSIS — Z131 Encounter for screening for diabetes mellitus: Secondary | ICD-10-CM

## 2023-01-13 DIAGNOSIS — I10 Essential (primary) hypertension: Secondary | ICD-10-CM | POA: Diagnosis not present

## 2023-01-13 DIAGNOSIS — R69 Illness, unspecified: Secondary | ICD-10-CM | POA: Diagnosis not present

## 2023-01-13 DIAGNOSIS — F32A Depression, unspecified: Secondary | ICD-10-CM

## 2023-01-13 DIAGNOSIS — F419 Anxiety disorder, unspecified: Secondary | ICD-10-CM

## 2023-01-13 MED ORDER — LOSARTAN POTASSIUM-HCTZ 50-12.5 MG PO TABS: 1.0000 | ORAL_TABLET | Freq: Every day | ORAL | 2 refills | Status: DC

## 2023-01-13 NOTE — Progress Notes (Signed)
.  Pt presents for chronic care management

## 2023-01-14 LAB — LIPID PANEL
Chol/HDL Ratio: 4.5 ratio (ref 0.0–5.0)
Cholesterol, Total: 183 mg/dL (ref 100–199)
HDL: 41 mg/dL (ref >39)
LDL Chol Calc (NIH): 108 mg/dL — ABNORMAL HIGH (ref 0–99)
Triglycerides: 196 mg/dL — ABNORMAL HIGH (ref 0–149)
VLDL Cholesterol Cal: 34 mg/dL (ref 5–40)

## 2023-01-14 LAB — BASIC METABOLIC PANEL
BUN/Creatinine Ratio: 13 (ref 10–24)
BUN: 14 mg/dL (ref 8–27)
CO2: 22 mmol/L (ref 20–29)
Calcium: 9.4 mg/dL (ref 8.6–10.2)
Chloride: 101 mmol/L (ref 96–106)
Creatinine, Ser: 1.04 mg/dL (ref 0.76–1.27)
Glucose: 65 mg/dL — ABNORMAL LOW (ref 70–99)
Potassium: 4 mmol/L (ref 3.5–5.2)
Sodium: 142 mmol/L (ref 134–144)
eGFR: 80 mL/min/{1.73_m2} (ref >59)

## 2023-01-14 LAB — HEMOGLOBIN A1C
Est. average glucose Bld gHb Est-mCnc: 114 mg/dL
Hgb A1c MFr Bld: 5.6 % (ref 4.8–5.6)

## 2023-02-09 DIAGNOSIS — R972 Elevated prostate specific antigen [PSA]: Secondary | ICD-10-CM | POA: Diagnosis not present

## 2023-02-11 ENCOUNTER — Other Ambulatory Visit (INDEPENDENT_AMBULATORY_CARE_PROVIDER_SITE_OTHER): Payer: 59

## 2023-02-11 DIAGNOSIS — E890 Postprocedural hypothyroidism: Secondary | ICD-10-CM

## 2023-02-11 LAB — TSH: TSH: 0.48 u[IU]/mL (ref 0.35–5.50)

## 2023-02-15 ENCOUNTER — Other Ambulatory Visit: Payer: Self-pay | Admitting: Urology

## 2023-02-15 DIAGNOSIS — R972 Elevated prostate specific antigen [PSA]: Secondary | ICD-10-CM

## 2023-02-19 ENCOUNTER — Ambulatory Visit (INDEPENDENT_AMBULATORY_CARE_PROVIDER_SITE_OTHER): Payer: HMO | Admitting: Pulmonary Disease

## 2023-02-19 ENCOUNTER — Encounter: Payer: Self-pay | Admitting: Pulmonary Disease

## 2023-02-19 ENCOUNTER — Other Ambulatory Visit: Payer: Self-pay | Admitting: Family

## 2023-02-19 VITALS — BP 122/76 | HR 92 | Ht 69.0 in | Wt 185.6 lb

## 2023-02-19 DIAGNOSIS — J431 Panlobular emphysema: Secondary | ICD-10-CM

## 2023-02-19 DIAGNOSIS — E785 Hyperlipidemia, unspecified: Secondary | ICD-10-CM

## 2023-02-19 DIAGNOSIS — F419 Anxiety disorder, unspecified: Secondary | ICD-10-CM

## 2023-02-19 MED ORDER — BREO ELLIPTA 100-25 MCG/ACT IN AEPB: 1.0000 | INHALATION_SPRAY | Freq: Every day | RESPIRATORY_TRACT | 3 refills | Status: DC

## 2023-02-19 NOTE — Patient Instructions (Signed)
Continue using Breo  Albuterol as needed  Graded exercise as tolerated  Call us with significant concerns  I will see you back in about a year

## 2023-02-19 NOTE — Progress Notes (Signed)
Gregg [name]    [id number]    1956/10/17  Primary Care Physician:Stephens, Flonnie Hailstone, NP  Referring Physician: Camillia Herter, NP [address] [place],  [place] [address]  Chief complaint:   Patient being seen for shortness of breath Recurrent bronchitis  HPI:  Continues to do well on Breo Uses Breo daily  Since the last visit as only needed albuterol probably once or twice  Continues to function well  Recent thyroidectomy for multinodular goiter  Quit smoking about 6 years ago  No significant history of lung disease  Activity level remains very good  Works as a Customer service manager  No pets  Does not feel limited with activities of daily living  He does have a history of hypertension, hypercholesterolemia, gallbladder surgery recently  Outpatient Encounter Medications as of 02/19/2023  Medication Sig   albuterol (VENTOLIN HFA) 108 (90 Base) MCG/ACT inhaler Inhale 2 puffs into the lungs every 6 (six) hours as needed for wheezing or shortness of breath.   atorvastatin (LIPITOR) 40 MG tablet TAKE 1 TABLET BY MOUTH EVERY DAY   BREO ELLIPTA 100-25 MCG/ACT AEPB Inhale 1 puff into the lungs daily.   Coenzyme Q10 (COQ10) 100 MG CAPS Take 100 mg by mouth in the morning.   fluticasone (FLONASE) 50 MCG/ACT nasal spray Place 2 sprays into both nostrils daily. (Patient taking differently: Place 2 sprays into both nostrils daily as needed for allergies or rhinitis.)   levothyroxine (SYNTHROID) 125 MCG tablet Take 1 tablet (125 mcg total) by mouth daily.   losartan-hydrochlorothiazide (HYZAAR) 50-12.5 MG tablet Take 1 tablet by mouth daily.   pantoprazole (PROTONIX) 40 MG tablet Take 1 tablet (40 mg total) by mouth daily before breakfast.   sertraline (ZOLOFT) 25 MG tablet TAKE 1 TABLET (25 MG TOTAL) BY MOUTH DAILY.   No facility-administered encounter medications on file as of 02/19/2023.    Allergies as of 02/19/2023   (No Known Allergies)    Past Medical  History:  Diagnosis Date   Anxiety    Asthma    Bilateral inguinal hernia 06/02/2021   GERD (gastroesophageal reflux disease)    History of kidney stones 2020   Hypertension    Sinus complaint 06/02/2021   x 1 week per pt   Wears glasses 06/02/2021    Past Surgical History:  Procedure Laterality Date   CHOLECYSTECTOMY     COLONOSCOPY WITH PROPOFOL N/A 12/31/2021   Procedure: COLONOSCOPY WITH PROPOFOL;  Surgeon: Lavena Bullion, DO;  [facility];  Service: Gastroenterology;  Laterality: N/A;   ENDOSCOPIC MUCOSAL RESECTION N/A 12/31/2021   Procedure: ENDOSCOPIC MUCOSAL RESECTION;  Surgeon: Lavena Bullion, DO;  [facility];  Service: Gastroenterology;  Laterality: N/A;   EXTRACORPOREAL SHOCK WAVE LITHOTRIPSY Left 02/23/2019   Procedure: EXTRACORPOREAL SHOCK WAVE LITHOTRIPSY (ESWL);  Surgeon: Bjorn Loser, MD;  [facility];  Service: Urology;  Laterality: Left;   HEMOSTASIS CLIP PLACEMENT  12/31/2021   Procedure: HEMOSTASIS CLIP PLACEMENT;  Surgeon: Lavena Bullion, DO;  [facility];  Service: Gastroenterology;;   INGUINAL HERNIA REPAIR Bilateral 06/06/2021   Procedure: LAPAROSCOPIC BILATERAL INGUINAL HERNIA REPAIRS WITH MESH;  Surgeon: Michael Boston, MD;  Location: [place];  Service: General;  Laterality: Bilateral;  TAP BLOCK   LAPAROSCOPIC APPENDECTOMY N/A 03/07/2021   Procedure: APPENDECTOMY LAPAROSCOPIC;  Surgeon: Michael Boston, MD;  [facility];  Service: General;  Laterality: N/A;   SUBMUCOSAL LIFTING INJECTION  12/31/2021  Procedure: SUBMUCOSAL LIFTING INJECTION;  Surgeon: Lavena Bullion, DO;  [facility];  Service: Gastroenterology;;   THYROIDECTOMY N/A 10/02/2022   Procedure: TOTAL THYROIDECTOMY;  Surgeon: Armandina Gemma, MD;  [facility];  Service: General;  Laterality: N/A;    Family History  Problem Relation Age of Onset   Thyroid disease Mother    Thyroid disease Father     Thyroid disease Sister    Stomach cancer Neg Hx    Colon cancer Neg Hx    Esophageal cancer Neg Hx    Rectal cancer Neg Hx     Social History   Socioeconomic History   Marital status: Single    Spouse name: Not on file   Number of children: Not on file   Years of education: Not on file   Highest education level: Not on file  Occupational History   Not on file  Tobacco Use   Smoking status: Former    Packs/day: 0.50    Years: 40.00    Total pack years: 20.00    Types: Cigarettes    Quit date: 03/29/2017    Years since quitting: 5.8    Passive exposure: Past   Smokeless tobacco: Never  Vaping Use   Vaping Use: Never used  Substance and Sexual Activity   Alcohol use: Yes    Comment: rare   Drug use: Never   Sexual activity: Not on file  Other Topics Concern   Not on file  Social History Narrative   Not on file   Social Determinants of Health   Financial Resource Strain: Not on file  Food Insecurity: No Food Insecurity (10/02/2022)   Hunger Vital Sign    Worried About Running Out of Food in the Last Year: Never true    Ran Out of Food in the Last Year: Never true  Transportation Needs: No Transportation Needs (10/02/2022)   PRAPARE - Hydrologist (Medical): No    Lack of Transportation (Non-Medical): No  Physical Activity: Not on file  Stress: Not on file  Social Connections: Not on file  Intimate Partner Violence: Not At Risk (10/02/2022)   Humiliation, Afraid, Rape, and Kick questionnaire    Fear of Current or Ex-Partner: No    Emotionally Abused: No    Physically Abused: No    Sexually Abused: No    Review of Systems  Constitutional:  Negative for fatigue.  Respiratory:  Positive for shortness of breath.     Vitals:   02/19/23 0828  BP: 122/76  Pulse: 92  SpO2: 95%     Physical Exam Constitutional:      Appearance: Normal appearance.  HENT:     Head: Normocephalic.     Nose: No congestion.     Mouth/Throat:      Mouth: Mucous membranes are moist.  Cardiovascular:     Rate and Rhythm: Normal rate and regular rhythm.     Heart sounds: No murmur heard.    No friction rub.  Pulmonary:     Effort: No respiratory distress.     Breath sounds: No stridor. No wheezing or rhonchi.  Musculoskeletal:     Cervical back: No rigidity or tenderness.  Neurological:     Mental Status: He is alert.  Psychiatric:        Mood and Affect: Mood normal.    Data Reviewed: No previous studies  Most recent chest x-ray shows no acute infiltrate  CT scan 09/17/2022-evidence of emphysema  PFT  2023 within normal limits-reviewed by myself  Assessment:  Recurrent bronchitis Wheezing Shortness of breath on exertion  CT with emphysema   Plan/Recommendations: Continue Breo  Albuterol as needed  Graded exercise as tolerated  Follow-up a year from now   Sherrilyn Rist MD [facility] 02/19/2023, 8:35 AM  CC: Camillia Herter, NP

## 2023-02-22 NOTE — Telephone Encounter (Signed)
Unable to refill per protocol, Rx request is too soon. Last refill 11/27/22 and 12/22/22 for 90 days.  Requested Prescriptions  Pending Prescriptions Disp Refills   atorvastatin (LIPITOR) 40 MG tablet [Pharmacy Med Name: ATORVASTATIN 40 MG TABLET] 30 tablet 2    Sig: TAKE 1 TABLET BY MOUTH EVERY DAY     Cardiovascular:  Antilipid - Statins Failed - 02/19/2023  6:57 PM      Failed - Lipid Panel in normal range within the last 12 months    Cholesterol, Total  Date Value Ref Range Status  01/13/2023 183 100 - 199 mg/dL Final   LDL Chol Calc (NIH)  Date Value Ref Range Status  01/13/2023 108 (H) 0 - 99 mg/dL Final   HDL  Date Value Ref Range Status  01/13/2023 41 >39 mg/dL Final   Triglycerides  Date Value Ref Range Status  01/13/2023 196 (H) 0 - 149 mg/dL Final         Passed - Patient is not pregnant      Passed - Valid encounter within last 12 months    Recent Outpatient Visits           1 month ago Primary hypertension   [place] [facility], Amy J, NP   4 months ago Primary hypertension   [place] [facility] [place], NP   4 months ago Hospital discharge follow-up   [facility], Amy J, NP   7 months ago Essential (primary) hypertension   [place] [facility], Amy J, NP   11 months ago Essential (primary) hypertension   [place] [facility], [place], NP       Future Appointments             In 1 month Camillia Herter, NP [place] [facility]             sertraline (ZOLOFT) 25 MG tablet [Pharmacy Med Name: SERTRALINE HCL 25 MG TABLET] 30 tablet 2    Sig: TAKE 1 TABLET (25 MG TOTAL) BY MOUTH DAILY.     Psychiatry:  Antidepressants - SSRI - sertraline Failed - 02/19/2023  6:57 PM      Failed - AST in normal range and within 360 days    AST  Date Value Ref Range Status  01/08/2022 33  15 - 41 U/L Final         Failed - ALT in normal range and within 360 days    ALT  Date Value Ref Range Status  01/08/2022 43 0 - 44 U/L Final         Passed - Completed PHQ-2 or PHQ-9 in the last 360 days      Passed - Valid encounter within last 6 months    Recent Outpatient Visits           1 month ago Primary hypertension   [place] [facility], Amy J, NP   4 months ago Primary hypertension   [place] [facility], [place], NP   4 months ago Hospital discharge follow-up   [facility], Amy J, NP   7 months ago Essential (primary) hypertension   [place] [facility], Amy J, NP   11 months ago Essential (primary)  hypertension   [place] [facility], [place], NP       Future Appointments             In 1 month Camillia Herter, NP [facility]

## 2023-02-23 ENCOUNTER — Encounter: Payer: Self-pay | Admitting: Gastroenterology

## 2023-03-15 ENCOUNTER — Ambulatory Visit
Admission: RE | Admit: 2023-03-15 | Discharge: 2023-03-15 | Disposition: A | Discharge status: Home / Self Care | Payer: HMO | Source: Ambulatory Visit | Attending: Urology | Admitting: Urology

## 2023-03-15 DIAGNOSIS — R972 Elevated prostate specific antigen [PSA]: Secondary | ICD-10-CM

## 2023-03-15 MED ORDER — GADOPICLENOL 0.5 MMOL/ML IV SOLN
8.0000 mL | Freq: Once | INTRAVENOUS | Status: AC | PRN
  Administered 2023-03-15: 8 mL via INTRAVENOUS

## 2023-03-17 ENCOUNTER — Other Ambulatory Visit: Payer: Self-pay | Admitting: Family

## 2023-03-17 DIAGNOSIS — F32A Depression, unspecified: Secondary | ICD-10-CM

## 2023-03-17 MED ORDER — SERTRALINE HCL 25 MG PO TABS: 25.0000 mg | ORAL_TABLET | Freq: Every day | ORAL | 0 refills | Status: DC

## 2023-03-18 ENCOUNTER — Other Ambulatory Visit: Payer: Self-pay | Admitting: Family

## 2023-03-18 DIAGNOSIS — E785 Hyperlipidemia, unspecified: Secondary | ICD-10-CM

## 2023-03-18 NOTE — Telephone Encounter (Signed)
Requested Prescriptions  Pending Prescriptions Disp Refills   atorvastatin (LIPITOR) 40 MG tablet [Pharmacy Med Name: ATORVASTATIN 40 MG TABLET] 90 tablet 0    Sig: TAKE 1 TABLET BY MOUTH EVERY DAY     Cardiovascular:  Antilipid - Statins Failed - 03/18/2023  2:17 AM      Failed - Lipid Panel in normal range within the last 12 months    Cholesterol, Total  Date Value Ref Range Status  01/13/2023 183 100 - 199 mg/dL Final   LDL Chol Calc (NIH)  Date Value Ref Range Status  01/13/2023 108 (H) 0 - 99 mg/dL Final   HDL  Date Value Ref Range Status  01/13/2023 41 >39 mg/dL Final   Triglycerides  Date Value Ref Range Status  01/13/2023 196 (H) 0 - 149 mg/dL Final         Passed - Patient is not pregnant      Passed - Valid encounter within last 12 months    Recent Outpatient Visits           2 months ago Primary hypertension   [place] [facility], Amy J, NP   5 months ago Primary hypertension   [place] [facility], [place], NP   5 months ago Hospital discharge follow-up   [facility], Amy J, NP   8 months ago Essential (primary) hypertension   [place] [facility], [place], NP   1 year ago Essential (primary) hypertension   [place] [facility], [place], NP       Future Appointments             In 1 month Camillia Herter, NP [place] at [facility]

## 2023-03-30 ENCOUNTER — Encounter: Payer: Self-pay | Admitting: Internal Medicine

## 2023-03-30 ENCOUNTER — Ambulatory Visit (INDEPENDENT_AMBULATORY_CARE_PROVIDER_SITE_OTHER): Payer: HMO | Admitting: Internal Medicine

## 2023-03-30 VITALS — BP 132/84 | HR 75 | Ht 69.0 in | Wt 184.8 lb

## 2023-03-30 DIAGNOSIS — E890 Postprocedural hypothyroidism: Secondary | ICD-10-CM

## 2023-03-30 LAB — TSH: TSH: 0.39 u[IU]/mL (ref 0.35–5.50)

## 2023-03-30 MED ORDER — LEVOTHYROXINE SODIUM 112 MCG PO TABS: 112.0000 ug | ORAL_TABLET | Freq: Every day | ORAL | 3 refills | Status: DC

## 2023-03-30 NOTE — Patient Instructions (Signed)

## 2023-03-30 NOTE — Progress Notes (Signed)
Name: Gregg [name]  MRN/ DOB: [id number], Jul 26, 1956    Age/ Sex: 67 y.o., male     PCP: Rema FendtStephens, Amy J, NP   Reason for Endocrinology Evaluation: Hyperthyroidism     Initial Endocrinology Clinic Visit: 12/01/2021    PATIENT IDENTIFIER: Mr. Gregg MaisDavid Franklin [name] is a 67 y.o., male with a past medical history of MNG. He has followed with [facility] since 12/01/2021 for consultative assistance with management of his hyperthyroidism.   HISTORICAL SUMMARY: The patient was first diagnosed with  hyperthyroidism in 2022.   Thyroid ultrasound showed multiple nodules in 11/2021 with multiple nodules meeting FNA criteria, but the pt declines   Pt was started on Methimazole   Pt with FH of thyroid disease    Patient opted to proceed with total thyroidectomy due to multinodular goiter, which was done on 10/02/2022 with benign pathology report   SUBJECTIVE:     Today (03/30/2023):  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   S/P total thyroidectomy 10/02/2022 Weight continues to fluctuate  Palpitations have resolved  Denies constipation or diarrhea  Denies tremors  Denies local neck swelling Scheduled for prostate  Bx tomorrow    Had a right upper shoulder pain since thyroid sx   Levothyroxine 125 mcg   HISTORY:  Past Medical History:  Past Medical History:  Diagnosis Date   Anxiety    Asthma    Bilateral inguinal hernia 06/02/2021   GERD (gastroesophageal reflux disease)    History of kidney stones 2020   Hypertension    Sinus complaint 06/02/2021   x 1 week per pt   Wears glasses 06/02/2021   Past Surgical History:  Past Surgical History:  Procedure Laterality Date   CHOLECYSTECTOMY     COLONOSCOPY WITH PROPOFOL N/A 12/31/2021   Procedure: COLONOSCOPY WITH PROPOFOL;  Surgeon: Shellia Cleverlyirigliano, Vito V, DO;  [facility];  Service: Gastroenterology;  Laterality: N/A;   ENDOSCOPIC MUCOSAL RESECTION  N/A 12/31/2021   Procedure: ENDOSCOPIC MUCOSAL RESECTION;  Surgeon: Shellia Cleverlyirigliano, Vito V, DO;  [facility];  Service: Gastroenterology;  Laterality: N/A;   EXTRACORPOREAL SHOCK WAVE LITHOTRIPSY Left 02/23/2019   Procedure: EXTRACORPOREAL SHOCK WAVE LITHOTRIPSY (ESWL);  Surgeon: Alfredo MartinezMacDiarmid, Scott, MD;  [facility];  Service: Urology;  Laterality: Left;   HEMOSTASIS CLIP PLACEMENT  12/31/2021   Procedure: HEMOSTASIS CLIP PLACEMENT;  Surgeon: Shellia Cleverlyirigliano, Vito V, DO;  [facility];  Service: Gastroenterology;;   INGUINAL HERNIA REPAIR Bilateral 06/06/2021   Procedure: LAPAROSCOPIC BILATERAL INGUINAL HERNIA REPAIRS WITH MESH;  Surgeon: Karie SodaGross, Steven, MD;  [facility];  Service: General;  Laterality: Bilateral;  TAP BLOCK   LAPAROSCOPIC APPENDECTOMY N/A 03/07/2021   Procedure: APPENDECTOMY LAPAROSCOPIC;  Surgeon: Karie SodaGross, Steven, MD;  [facility];  Service: General;  Laterality: N/A;   SUBMUCOSAL LIFTING INJECTION  12/31/2021   Procedure: SUBMUCOSAL LIFTING INJECTION;  Surgeon: Shellia Cleverlyirigliano, Vito V, DO;  [facility];  Service: Gastroenterology;;   THYROIDECTOMY N/A 10/02/2022   Procedure: TOTAL THYROIDECTOMY;  Surgeon: Darnell LevelGerkin, Todd, MD;  [facility];  Service: General;  Laterality: N/A;   Social History:  reports that he quit smoking about 6 years ago. His smoking use included cigarettes. He has a 20.00 pack-year smoking history. He has been exposed to tobacco smoke. He has never used smokeless tobacco. He reports current alcohol use. He reports that he does not use drugs. Family History:  Family History  Problem Relation Age of Onset   Thyroid  disease Mother    Thyroid disease Father    Thyroid disease Sister    Stomach cancer Neg Hx    Colon cancer Neg Hx    Esophageal cancer Neg Hx    Rectal cancer Neg Hx      HOME MEDICATIONS: Allergies as of 03/30/2023   No Known Allergies      Medication List        Accurate as of  March 30, 2023  2:02 PM. If you have any questions, ask your nurse or doctor.          albuterol 108 (90 Base) MCG/ACT inhaler Commonly known as: VENTOLIN HFA Inhale 2 puffs into the lungs every 6 (six) hours as needed for wheezing or shortness of breath.   atorvastatin 40 MG tablet Commonly known as: LIPITOR TAKE 1 TABLET BY MOUTH EVERY DAY   Breo Ellipta 100-25 MCG/ACT Aepb Generic drug: fluticasone furoate-vilanterol Inhale 1 puff into the lungs daily.   CoQ10 100 MG Caps Take 100 mg by mouth in the morning.   fluticasone 50 MCG/ACT nasal spray Commonly known as: FLONASE Place 2 sprays into both nostrils daily. What changed:  when to take this reasons to take this   levothyroxine 125 MCG tablet Commonly known as: SYNTHROID Take 1 tablet (125 mcg total) by mouth daily.   losartan-hydrochlorothiazide 50-12.5 MG tablet Commonly known as: HYZAAR Take 1 tablet by mouth daily.   pantoprazole 40 MG tablet Commonly known as: PROTONIX Take 1 tablet (40 mg total) by mouth daily before breakfast.   sertraline 25 MG tablet Commonly known as: ZOLOFT Take 1 tablet (25 mg total) by mouth daily.          OBJECTIVE:   PHYSICAL EXAM: VS: BP 132/84 (BP Location: Left Arm, Patient Position: Sitting, Cuff Size: Normal)   Pulse 75   Ht 5\' 9"  (1.753 m)   Wt 184 lb 12.8 oz (83.8 kg)   SpO2 96%   BMI 27.29 kg/m    EXAM: General: Pt appears well and is in NAD  Neck: General: Supple without adenopathy. Thyroid: incision clean   Lungs: Clear with good BS bilat with no rales, rhonchi, or wheezes  Heart: Auscultation: RRR.  Extremities:  BL LE: No pretibial edema normal ROM and strength.  Mental Status: Judgment, insight: Intact Orientation: Oriented to time, place, and person Mood and affect: No depression, anxiety, or agitation     DATA REVIEWED:  Latest Reference Range & Units 03/30/23 10:47  TSH 0.35 - 5.50 uIU/mL 0.39     Thyroid Pathology  10/02/2022   FINAL MICROSCOPIC DIAGNOSIS:   A. THYROID, TOTAL THYROIDECTOMY:  - ADENOMATOID NODULAR HYPERPLASIA (SEE COMMENT).  - NEGATIVE FOR MALIGNANCY.   ASSESSMENT / PLAN / RECOMMENDATIONS:   Postoperative Hypothyroidism  -Patient is clinically euthyroid -No local neck symptoms -TSH is trending down, will reduce the dose as below   Medication  Stop levothyroxine 125 mcg Start levothyroxine 112 mcg daily  Follow-up in 6 months   Signed electronically by: Lyndle Herrlich, MD  [facility]  [facility] [address] [place] [place], [place] [address] Phone: [phone] FAX: [phone]      CC: Rema Fendt, NP [address] [place] [address] Phone: [phone]  Fax: [phone]   Return to Endocrinology clinic as below: Future Appointments  Date Time Provider Department Center  04/08/2023  9:00 AM [facility] PREVISIT RM 52 [facility]  04/20/2023  8:20 AM Rema Fendt, NP  [facility] None  04/29/2023  8:30 AM Cirigliano, Vito V, DO [facility]  10/05/2023  7:50 AM Tanairi Cypert, Konrad Dolores, MD [facility] None

## 2023-03-31 DIAGNOSIS — D075 Carcinoma in situ of prostate: Secondary | ICD-10-CM | POA: Diagnosis not present

## 2023-03-31 DIAGNOSIS — C61 Malignant neoplasm of prostate: Secondary | ICD-10-CM | POA: Diagnosis not present

## 2023-04-05 DIAGNOSIS — C801 Malignant (primary) neoplasm, unspecified: Secondary | ICD-10-CM

## 2023-04-05 HISTORY — DX: Malignant (primary) neoplasm, unspecified: C80.1

## 2023-04-07 ENCOUNTER — Other Ambulatory Visit (HOSPITAL_COMMUNITY): Payer: Self-pay | Admitting: Urology

## 2023-04-07 DIAGNOSIS — C61 Malignant neoplasm of prostate: Secondary | ICD-10-CM

## 2023-04-08 ENCOUNTER — Ambulatory Visit (AMBULATORY_SURGERY_CENTER): Payer: HMO | Admitting: *Deleted

## 2023-04-08 VITALS — Ht 69.0 in | Wt 185.0 lb

## 2023-04-08 DIAGNOSIS — C61 Malignant neoplasm of prostate: Secondary | ICD-10-CM | POA: Diagnosis not present

## 2023-04-08 DIAGNOSIS — Z86010 Personal history of colonic polyps: Secondary | ICD-10-CM

## 2023-04-08 MED ORDER — NA SULFATE-K SULFATE-MG SULF 17.5-3.13-1.6 GM/177ML PO SOLN
1.0000 | Freq: Once | ORAL | 0 refills | Status: AC
Start: 2023-04-08 — End: 2023-04-08

## 2023-04-08 NOTE — Progress Notes (Addendum)
Pt's pre-visit is done over the phone and all paperwork (prep instructions) sent to patient. Pt's name and DOB verified at the beginning of the pre-visit. Pt denies any difficulty with ambulating.  No egg or soy allergy known to patient  No issues known to pt with past sedation with any surgeries or procedures Pt denies having issues being intubated Pt has no issues moving head neck or swallowing No FH of Malignant Hyperthermia Pt is not on diet pills Pt is not on home 02  Pt is not on blood thinners  Pt denies issues with constipation  Pt has frequent issues with constipation RN instructed pt to use Miralax per bottles instructions a week before prep days. Pt states they will Pt is not on dialysis Pt denies any upcoming cardiac testing Pt encouraged to use to use Singlecare or Goodrx to reduce cost  Patient's chart reviewed by Cathlyn Parsons CNRA prior to pre-visit and patient appropriate for the [facility].  Pre-visit completed and red dot placed by patient's name on their procedure day (on provider's schedule).  . Visit by phone Pt states  weight is 187 lb Instructions reviewed with pt and pt states understanding. Instructed to review again prior to procedure. Pt states they will.  Instructions sent by mail with coupon and by my chart Pt states he was diagnosed with prostate CA  on 4/15 and is to see MD today about POC. He  has a PET scan on 5/6 and will ask MD today if OK to do Colonoscopy on the Thursday following procedure on 5/6 or at all during POC for CA. He states it is in early stage CA. Gave # to facility for pt to call if needs to reschedule or cancel.colonoscopy.

## 2023-04-12 NOTE — Progress Notes (Signed)
Patient ID: Ayham Word, male    DOB: [birth year]  MRN: [id number]  CC: Chronic Care Management   Subjective: Gregg Carlisi is a 67 y.o. male who presents for chronic care management.   His concerns today include:  - Doing well on blood pressure medication, no issues/concerns. Blood pressures outside of office 120's/70's. He denies red flag symptoms such as but not limited to chest pain, shortness of breath, worst headache of life, nausea/vomiting.  - Doing well on cholesterol medication, no issues/concerns. - Doing well on anxiety depression medication, no issues/concerns. Reports he was recently diagnosed with prostate cancer. He is established with Urology and upcoming appointment with Oncology. Reports he has a supportive partner. He is not interested in counseling as of present. He is aware to notify me if he would like a referral in the future. He denies thoughts of self-harm, suicidal ideations, homicidal ideations.  Patient Active Problem List   Diagnosis Date Noted   Status post total thyroidectomy 10/14/2022   Toxic multinodular goiter 10/02/2022   Adenomatous polyp of transverse colon 09/10/2022   Diverticulosis of colon without hemorrhage 09/10/2022   Grade II internal hemorrhoids 09/10/2022   Mild persistent asthma 07/10/2022   Former smoker 07/10/2022   Enlarged thyroid gland 01/13/2022   Symptomatic cholelithiasis 01/08/2022   S/P laparoscopic cholecystectomy 01/08/2022   Multinodular goiter 12/01/2021   Multiple thyroid nodules 12/01/2021   Hyperlipidemia 09/11/2021   Essential hypertension 07/22/2021   Anxiety and depression 07/02/2021   History of appendicitis 07/01/2021   Acute phlegmonous appendicitis s/p lap appendectomy 03/07/2021 03/07/2021   Kidney stone 03/07/2021   Bilateral inguinal hernia (BIH) s/p lap repair with mesh 06/06/2021 03/07/2021     Current Outpatient Medications on File Prior to Visit  Medication Sig Dispense Refill   BREO ELLIPTA  100-25 MCG/ACT AEPB Inhale 1 puff into the lungs daily. 3 each 3   Coenzyme Q10 (COQ10) 100 MG CAPS Take 100 mg by mouth in the morning.     fluticasone (FLONASE) 50 MCG/ACT nasal spray Place 2 sprays into both nostrils daily. (Patient taking differently: Place 2 sprays into both nostrils daily as needed for allergies or rhinitis.) 16 g 0   levothyroxine (SYNTHROID) 112 MCG tablet Take 1 tablet (112 mcg total) by mouth daily. 90 tablet 3   albuterol (VENTOLIN HFA) 108 (90 Base) MCG/ACT inhaler Inhale 2 puffs into the lungs every 6 (six) hours as needed for wheezing or shortness of breath. (Patient not taking: Reported on 04/08/2023) 8 g 1   pantoprazole (PROTONIX) 40 MG tablet Take 1 tablet (40 mg total) by mouth daily before breakfast. (Patient not taking: Reported on 04/20/2023) 90 tablet 3   No current facility-administered medications on file prior to visit.    No Known Allergies  Social History   Socioeconomic History   Marital status: Single    Spouse name: Not on file   Number of children: Not on file   Years of education: Not on file   Highest education level: Bachelor's degree (e.g., BA, AB, BS)  Occupational History   Not on file  Tobacco Use   Smoking status: Former    Packs/day: 0.50    Years: 40.00    Additional pack years: 0.00    Total pack years: 20.00    Types: Cigarettes    Quit date: 03/29/2017    Years since quitting: 6.0    Passive exposure: Past   Smokeless tobacco: Never  Vaping Use   Vaping Use: Never  used  Substance and Sexual Activity   Alcohol use: Yes    Comment: rare   Drug use: Never   Sexual activity: Not on file  Other Topics Concern   Not on file  Social History Narrative   Not on file   Social Determinants of Health   Financial Resource Strain: Low Risk  (04/16/2023)   Overall Financial Resource Strain (CARDIA)    Difficulty of Paying Living Expenses: Not hard at all  Food Insecurity: No Food Insecurity (04/16/2023)   Hunger Vital Sign     Worried About Running Out of Food in the Last Year: Never true    Ran Out of Food in the Last Year: Never true  Transportation Needs: No Transportation Needs (04/16/2023)   PRAPARE - Administrator, Civil Service (Medical): No    Lack of Transportation (Non-Medical): No  Physical Activity: Unknown (04/16/2023)   Exercise Vital Sign    Days of Exercise per Week: 5 days    Minutes of Exercise per Session: Patient declined  Stress: No Stress Concern Present (04/16/2023)   [employer] of Occupational Health - Occupational Stress Questionnaire    Feeling of Stress : Not at all  Social Connections: Moderately Integrated (04/16/2023)   Social Connection and Isolation Panel [NHANES]    Frequency of Communication with Friends and Family: More than three times a week    Frequency of Social Gatherings with Friends and Family: More than three times a week    Attends Religious Services: More than 4 times per year    Active Member of [employer] or Organizations: Yes    Attends Engineer, structural: More than 4 times per year    Marital Status: Never married  Intimate Partner Violence: Not At Risk (10/02/2022)   Humiliation, Afraid, Rape, and Kick questionnaire    Fear of Current or Ex-Partner: No    Emotionally Abused: No    Physically Abused: No    Sexually Abused: No    Family History  Problem Relation Age of Onset   Thyroid disease Mother    Thyroid disease Father    Thyroid disease Sister    Stomach cancer Neg Hx    Colon cancer Neg Hx    Esophageal cancer Neg Hx    Rectal cancer Neg Hx    Colon polyps Neg Hx     Past Surgical History:  Procedure Laterality Date   CHOLECYSTECTOMY     COLONOSCOPY WITH PROPOFOL N/A 12/31/2021   Procedure: COLONOSCOPY WITH PROPOFOL;  Surgeon: Shellia Cleverly, DO;  Location: [facility];  Service: Gastroenterology;  Laterality: N/A;   ENDOSCOPIC MUCOSAL RESECTION N/A 12/31/2021   Procedure: ENDOSCOPIC MUCOSAL RESECTION;   Surgeon: Shellia Cleverly, DO;  Location: [facility];  Service: Gastroenterology;  Laterality: N/A;   EXTRACORPOREAL SHOCK WAVE LITHOTRIPSY Left 02/23/2019   Procedure: EXTRACORPOREAL SHOCK WAVE LITHOTRIPSY (ESWL);  Surgeon: Alfredo Martinez, MD;  Location: [facility];  Service: Urology;  Laterality: Left;   HEMOSTASIS CLIP PLACEMENT  12/31/2021   Procedure: HEMOSTASIS CLIP PLACEMENT;  Surgeon: Shellia Cleverly, DO;  Location: [facility];  Service: Gastroenterology;;   INGUINAL HERNIA REPAIR Bilateral 06/06/2021   Procedure: LAPAROSCOPIC BILATERAL INGUINAL HERNIA REPAIRS WITH MESH;  Surgeon: Karie Soda, MD;  Location: [facility] [place];  Service: General;  Laterality: Bilateral;  TAP BLOCK   LAPAROSCOPIC APPENDECTOMY N/A 03/07/2021   Procedure: APPENDECTOMY LAPAROSCOPIC;  Surgeon: Karie Soda, MD;  Location: [facility];  Service: General;  Laterality: N/A;  SUBMUCOSAL LIFTING INJECTION  12/31/2021   Procedure: SUBMUCOSAL LIFTING INJECTION;  Surgeon: Shellia Cleverly, DO;  Location: [facility];  Service: Gastroenterology;;   THYROIDECTOMY N/A 10/02/2022   Procedure: TOTAL THYROIDECTOMY;  Surgeon: Darnell Level, MD;  Location: [facility];  Service: General;  Laterality: N/A;    ROS: Review of Systems Negative except as stated above  PHYSICAL EXAM: BP 127/77   Pulse 87   Ht 5\' 9"  (1.753 m)   Wt 183 lb (83 kg)   SpO2 93%   BMI 27.02 kg/m   Physical Exam HENT:     Head: Normocephalic and atraumatic.  Eyes:     Extraocular Movements: Extraocular movements intact.     Conjunctiva/sclera: Conjunctivae normal.     Pupils: Pupils are equal, round, and reactive to light.  Cardiovascular:     Rate and Rhythm: Normal rate and regular rhythm.     Pulses: Normal pulses.     Heart sounds: Normal heart sounds.  Pulmonary:     Effort: Pulmonary effort is normal.     Breath sounds: Normal breath sounds.  Musculoskeletal:     Cervical back: Normal range of motion and neck  supple.  Neurological:     General: No focal deficit present.     Mental Status: He is alert and oriented to person, place, and time.  Psychiatric:        Mood and Affect: Mood normal.        Behavior: Behavior normal.      ASSESSMENT AND PLAN: 1. Primary hypertension - Continue Losartan-Hydrochlorothiazide as prescribed.  - Counseled on blood pressure goal of less than 140/90, low-sodium, DASH diet, medication compliance, 150 minutes of moderate intensity exercise per week as tolerated. Discussed medication compliance, adverse effects. - Follow-up with primary provider in 6 months or sooner if needed. - losartan-hydrochlorothiazide (HYZAAR) 50-12.5 MG tablet; Take 1 tablet by mouth daily.  Dispense: 90 tablet; Refill: 0  2. Hyperlipidemia, unspecified hyperlipidemia type - Continue Atorvastatin as prescribed.  - Follow-up with primary provider as scheduled.  - atorvastatin (LIPITOR) 40 MG tablet; Take 1 tablet (40 mg total) by mouth daily.  Dispense: 90 tablet; Refill: 0  3. Anxiety and depression - Patient denies thoughts of self-harm, suicidal ideations, homicidal ideations. - Continue Sertraline as prescribed.  - Patient declined referral to counseling.  - Follow-up with primary provider in 6 months or sooner if needed.  - sertraline (ZOLOFT) 25 MG tablet; Take 1 tablet (25 mg total) by mouth daily.  Dispense: 90 tablet; Refill: 0   Patient was given the opportunity to ask questions.  Patient verbalized understanding of the plan and was able to repeat key elements of the plan. Patient was given clear instructions to go to Emergency Department or return to medical center if symptoms don't improve, worsen, or new problems develop.The patient verbalized understanding.    Requested Prescriptions   Signed Prescriptions Disp Refills   atorvastatin (LIPITOR) 40 MG tablet 90 tablet 0    Sig: Take 1 tablet (40 mg total) by mouth daily.   losartan-hydrochlorothiazide (HYZAAR) 50-12.5  MG tablet 90 tablet 0    Sig: Take 1 tablet by mouth daily.   sertraline (ZOLOFT) 25 MG tablet 90 tablet 0    Sig: Take 1 tablet (25 mg total) by mouth daily.    Return in about 6 months (around 10/20/2023) for Follow-Up or next available chronic care mgmt .  Rema Fendt, NP

## 2023-04-20 ENCOUNTER — Encounter: Payer: Self-pay | Admitting: Family

## 2023-04-20 ENCOUNTER — Ambulatory Visit (INDEPENDENT_AMBULATORY_CARE_PROVIDER_SITE_OTHER): Payer: HMO | Admitting: Family

## 2023-04-20 VITALS — BP 127/77 | HR 87 | Ht 69.0 in | Wt 183.0 lb

## 2023-04-20 DIAGNOSIS — I10 Essential (primary) hypertension: Secondary | ICD-10-CM | POA: Diagnosis not present

## 2023-04-20 DIAGNOSIS — F419 Anxiety disorder, unspecified: Secondary | ICD-10-CM | POA: Diagnosis not present

## 2023-04-20 DIAGNOSIS — E785 Hyperlipidemia, unspecified: Secondary | ICD-10-CM

## 2023-04-20 DIAGNOSIS — F32A Depression, unspecified: Secondary | ICD-10-CM | POA: Diagnosis not present

## 2023-04-20 MED ORDER — ATORVASTATIN CALCIUM 40 MG PO TABS: 40.0000 mg | ORAL_TABLET | Freq: Every day | ORAL | 0 refills | Status: DC

## 2023-04-20 MED ORDER — LOSARTAN POTASSIUM-HCTZ 50-12.5 MG PO TABS
1.0000 | ORAL_TABLET | Freq: Every day | ORAL | 0 refills | Status: DC
Start: 2023-04-20 — End: 2023-06-15

## 2023-04-20 MED ORDER — SERTRALINE HCL 25 MG PO TABS: 25.0000 mg | ORAL_TABLET | Freq: Every day | ORAL | 0 refills | Status: DC

## 2023-04-20 NOTE — Progress Notes (Signed)
Patient here to follow up on HTN

## 2023-04-26 ENCOUNTER — Encounter (HOSPITAL_COMMUNITY)
Admission: RE | Admit: 2023-04-26 | Discharge: 2023-04-26 | Disposition: A | Discharge status: Home / Self Care | Payer: HMO | Source: Ambulatory Visit | Attending: Urology | Admitting: Urology

## 2023-04-26 DIAGNOSIS — C61 Malignant neoplasm of prostate: Secondary | ICD-10-CM | POA: Diagnosis not present

## 2023-04-26 MED ORDER — PIFLIFOLASTAT F 18 (PYLARIFY) INJECTION
9.0000 | Freq: Once | INTRAVENOUS | Status: AC
  Administered 2023-04-26: 9.05 via INTRAVENOUS

## 2023-04-29 ENCOUNTER — Encounter: Payer: Self-pay | Admitting: Gastroenterology

## 2023-04-29 ENCOUNTER — Encounter: Payer: Self-pay | Admitting: Family

## 2023-04-29 ENCOUNTER — Ambulatory Visit (AMBULATORY_SURGERY_CENTER): Payer: HMO | Admitting: Gastroenterology

## 2023-04-29 VITALS — BP 104/71 | HR 79 | Temp 98.4°F | Resp 12 | Ht 69.0 in | Wt 185.0 lb

## 2023-04-29 DIAGNOSIS — K621 Rectal polyp: Secondary | ICD-10-CM | POA: Diagnosis not present

## 2023-04-29 DIAGNOSIS — Z09 Encounter for follow-up examination after completed treatment for conditions other than malignant neoplasm: Secondary | ICD-10-CM | POA: Diagnosis not present

## 2023-04-29 DIAGNOSIS — F419 Anxiety disorder, unspecified: Secondary | ICD-10-CM | POA: Diagnosis not present

## 2023-04-29 DIAGNOSIS — D123 Benign neoplasm of transverse colon: Secondary | ICD-10-CM

## 2023-04-29 DIAGNOSIS — D124 Benign neoplasm of descending colon: Secondary | ICD-10-CM | POA: Diagnosis not present

## 2023-04-29 DIAGNOSIS — Z86010 Personal history of colonic polyps: Secondary | ICD-10-CM | POA: Diagnosis not present

## 2023-04-29 DIAGNOSIS — K640 First degree hemorrhoids: Secondary | ICD-10-CM

## 2023-04-29 DIAGNOSIS — I10 Essential (primary) hypertension: Secondary | ICD-10-CM | POA: Diagnosis not present

## 2023-04-29 DIAGNOSIS — E039 Hypothyroidism, unspecified: Secondary | ICD-10-CM | POA: Diagnosis not present

## 2023-04-29 DIAGNOSIS — D128 Benign neoplasm of rectum: Secondary | ICD-10-CM | POA: Diagnosis not present

## 2023-04-29 DIAGNOSIS — K5730 Diverticulosis of large intestine without perforation or abscess without bleeding: Secondary | ICD-10-CM

## 2023-04-29 DIAGNOSIS — K635 Polyp of colon: Secondary | ICD-10-CM | POA: Diagnosis not present

## 2023-04-29 MED ORDER — SODIUM CHLORIDE 0.9 % IV SOLN
500.0000 mL | Freq: Once | INTRAVENOUS | Status: DC
Start: 2023-04-29 — End: 2023-04-29

## 2023-04-29 NOTE — Patient Instructions (Signed)
-  Handout on polyps, diverticulosis provided -await pathology results -repeat colonoscopy for surveillance recommended. Date to be determined when pathology result become available   -Continue present medications   YOU HAD AN ENDOSCOPIC PROCEDURE TODAY AT THE [facility]:   Refer to the procedure report that was given to you for any specific questions about what was found during the examination.  If the procedure report does not answer your questions, please call your gastroenterologist to clarify.  If you requested that your care partner not be given the details of your procedure findings, then the procedure report has been included in a sealed envelope for you to review at your convenience later.  YOU SHOULD EXPECT: Some feelings of bloating in the abdomen. Passage of more gas than usual.  Walking can help get rid of the air that was put into your GI tract during the procedure and reduce the bloating. If you had a lower endoscopy (such as a colonoscopy or flexible sigmoidoscopy) you may notice spotting of blood in your stool or on the toilet paper. If you underwent a bowel prep for your procedure, you may not have a normal bowel movement for a few days.  Please Note:  You might notice some irritation and congestion in your nose or some drainage.  This is from the oxygen used during your procedure.  There is no need for concern and it should clear up in a day or so.  SYMPTOMS TO REPORT IMMEDIATELY:  Following lower endoscopy (colonoscopy or flexible sigmoidoscopy):  Excessive amounts of blood in the stool  Significant tenderness or worsening of abdominal pains  Swelling of the abdomen that is new, acute  Fever of 100F or higher   For urgent or emergent issues, a gastroenterologist can be reached at any hour by calling [phone]. Do not use MyChart messaging for urgent concerns.    DIET:  We do recommend a small meal at first, but then you may proceed to your regular diet.   Drink plenty of fluids but you should avoid alcoholic beverages for 24 hours.  ACTIVITY:  You should plan to take it easy for the rest of today and you should NOT DRIVE or use heavy machinery until tomorrow (because of the sedation medicines used during the test).    FOLLOW UP: Our staff will call the number listed on your records the next business day following your procedure.  We will call around 7:15- 8:00 am to check on you and address any questions or concerns that you may have regarding the information given to you following your procedure. If we do not reach you, we will leave a message.     If any biopsies were taken you will be contacted by phone or by letter within the next 1-3 weeks.  Please call us at [phone] if you have not heard about the biopsies in 3 weeks.    SIGNATURES/CONFIDENTIALITY: You and/or your care partner have signed paperwork which will be entered into your electronic medical record.  These signatures attest to the fact that that the information above on your After Visit Summary has been reviewed and is understood.  Full responsibility of the confidentiality of this discharge information lies with you and/or your care-partner.   

## 2023-04-29 NOTE — Progress Notes (Signed)
Called to room to assist during endoscopic procedure.  Patient ID and intended procedure confirmed with present staff. Received instructions for my participation in the procedure from the performing physician.  

## 2023-04-29 NOTE — Op Note (Signed)
[facility] Patient Name: [name] Procedure Date: 04/29/2023 8:27 AM MRN: [id number] Endoscopist: Doristine Locks , MD, [id number] Age: 67 Referring MD:  Date of Birth: October 20, 1956 Gender: Male Account #: [id number] Procedure:                Colonoscopy Indications:              High risk colon cancer surveillance: Personal                            history of colonic polyps                           ?" Colonoscopy (10/17/2021): 3 polyps ranging 5-10                            mm (path: Tubular adenoma, SSP), 30 mm polyp in                            proximal transverse colon biopsied and tattooed                            (path: Tubular adenoma), 8 mm flat transverse polyp                            (path: SSP), 2 flat polyps ranging 8-12 mm in                            descending colon (path: HP), 2 subcentimeter                            sigmoid/descending polyps (path: TA x1, HP x1), 2                            small rectal polyps (path: HP). Sigmoid                            diverticulosis, internal hemorrhoids. Normal TI                           ?" Colonoscopy (12/31/2021): 30 mm transverse colon                            adenoma removed via EMR en bloc then clipped                            closed. Sigmoid diverticulosis. Internal                            hemorrhoids. Repeat in 1 year Medicines:                Monitored Anesthesia Care Procedure:                Pre-Anesthesia Assessment:                           -  Prior to the procedure, a History and Physical                            was performed, and patient medications and                            allergies were reviewed. The patient's tolerance of                            previous anesthesia was also reviewed. The risks                            and benefits of the procedure and the sedation                            options and risks were discussed with the patient.                             All questions were answered, and informed consent                            was obtained. Prior Anticoagulants: The patient has                            taken no anticoagulant or antiplatelet agents. ASA                            Grade Assessment: III - A patient with severe                            systemic disease. After reviewing the risks and                            benefits, the patient was deemed in satisfactory                            condition to undergo the procedure.                           After obtaining informed consent, the colonoscope                            was passed under direct vision. Throughout the                            procedure, the patient's blood pressure, pulse, and                            oxygen saturations were monitored continuously. The                            CF [id number] #[id number] was introduced through the anus  and advanced to the the cecum, identified by                            appendiceal orifice and ileocecal valve. The                            colonoscopy was performed without difficulty. The                            patient tolerated the procedure well. The quality                            of the bowel preparation was good. The ileocecal                            valve, appendiceal orifice, and rectum were                            photographed. Scope In: 8:31:06 AM Scope Out: 8:58:23 AM Scope Withdrawal Time: 0 hours 23 minutes 53 seconds  Total Procedure Duration: 0 hours 27 minutes 17 seconds  Findings:                 The perianal and digital rectal examinations were                            normal.                           Three sessile polyps were found in the descending                            colon (1) and transverse colon (2). The polyps were                            3 to 4 mm in size. The transverse polyps were                            located about 2-3 cm proximal  to the previous                            tattoo site, but not located near the prior                            polypectomy scar. These polyps were removed with a                            cold snare. Resection and retrieval were complete.                            Estimated blood loss was minimal.                           A 2 mm polyp was found in the descending colon. The  polyp was sessile and located within a previous                            polypectomy scar. Due to the scarring, could not                            resect with a cold snar. Instead, the polyp was                            removed with a cold biopsy forceps. Resection and                            retrieval were complete. Estimated blood loss was                            minimal.                           A tattoo was seen in the transverse colon. A                            post-polypectomy scar was found 2-3 cm proximal to                            the tattoo site. The post polypectomy scar was                            otherwise normal appearing and without any residual                            polypoid tissue.                           Two sessile polyps were found in the rectum. The                            polyps were 2 to 4 mm in size. These polyps were                            removed with a cold snare. Resection and retrieval                            were complete. Estimated blood loss was minimal.                           A few small-mouthed diverticula were found in the                            sigmoid colon.                           Scars were found in the distal rectum from previous  hemorrhoid banding sites. The scar tissue was                            healthy in appearance. Complications:            No immediate complications. Estimated Blood Loss:     Estimated blood loss was minimal. Impression:               - Three 3 to  4 mm polyps in the descending colon                            and in the transverse colon, removed with a cold                            snare. Resected and retrieved.                           - One 2 mm polyp in the descending colon, removed                            with a cold biopsy forceps. Resected and retrieved.                           - A tattoo was seen in the transverse colon. A                            post-polypectomy scar was found at the tattoo site.                           - Two 2 to 4 mm polyps in the rectum, removed with                            a cold snare. Resected and retrieved.                           - Diverticulosis in the sigmoid colon.                           - Scar in the distal rectum. Recommendation:           - Patient has a contact number available for                            emergencies. The signs and symptoms of potential                            delayed complications were discussed with the                            patient. Return to normal activities tomorrow.                            Written discharge instructions were provided to the  patient.                           - Resume previous diet.                           - Continue present medications.                           - Await pathology results.                           - Repeat colonoscopy in 3 years for surveillance,                            or potentially sooner based on pathology results.                           - Return to GI office PRN. Doristine Locks, MD 04/29/2023 9:11:32 AM

## 2023-04-29 NOTE — Progress Notes (Signed)
Pt's states no medical or surgical changes since previsit or office visit. 

## 2023-04-29 NOTE — Progress Notes (Signed)
Pt resting comfortably. VSS. Airway intact. SBAR complete to RN. All questions answered.   

## 2023-04-29 NOTE — Progress Notes (Signed)
GASTROENTEROLOGY PROCEDURE H&P NOTE   Primary Care Physician: Rema Fendt, NP    Reason for Procedure:  Colon Cancer screening, colon polyp surveillance  Plan:    Colonoscopy  Patient is appropriate for endoscopic procedure(s) in the ambulatory ([facility]) setting.  The nature of the procedure, as well as the risks, benefits, and alternatives were carefully and thoroughly reviewed with the patient. Ample time for discussion and questions allowed. The patient understood, was satisfied, and agreed to proceed.     HPI: Gregg [name] is a 67 y.o. male who presents for colonoscopy for continued short interval colon polyp surveillance.  No active GI symptoms.  No known family history of colon cancer or related malignancy.  Patient is otherwise without complaints or active issues today.  Endoscopic History: - Colonoscopy (10/17/2021): 3 polyps ranging 5-10 mm (path: Tubular adenoma, SSP), 30 mm polyp in proximal transverse colon biopsied and tattooed (path: Tubular adenoma), 8 mm flat transverse polyp (path: SSP), 2 flat polyps ranging 8-12 mm in descending colon (path: HP), 2 subcentimeter sigmoid/descending polyps (path: TA x1, HP x1), 2 small rectal polyps (path: HP).  Sigmoid diverticulosis, internal hemorrhoids.  Normal TI - Colonoscopy (12/31/2021): 30 mm transverse colon adenoma removed via EMR en bloc then clipped closed.  Sigmoid diverticulosis.  Internal hemorrhoids.  Repeat in 1 year - EGD (06/2022): LA Grade B esophagitis (path: Reflux changes), 4 cm hiatal hernia, normal stomach and duodenum.  Started on Protonix 40 mg twice daily x6 weeks   Past Medical History:  Diagnosis Date   Anxiety    Asthma    Bilateral inguinal hernia 06/02/2021   Cancer (HCC) 04/05/2023   Prostrate CA early   Chronic kidney disease    GERD (gastroesophageal reflux disease)    History of kidney stones 2020   Hyperlipidemia    Hypertension    Sinus complaint 06/02/2021   x 1 week per pt    Thyroid disease    Wears glasses 06/02/2021    Past Surgical History:  Procedure Laterality Date   CHOLECYSTECTOMY     COLONOSCOPY WITH PROPOFOL N/A 12/31/2021   Procedure: COLONOSCOPY WITH PROPOFOL;  Surgeon: Shellia Cleverly, DO;  Location: [facility];  Service: Gastroenterology;  Laterality: N/A;   ENDOSCOPIC MUCOSAL RESECTION N/A 12/31/2021   Procedure: ENDOSCOPIC MUCOSAL RESECTION;  Surgeon: Shellia Cleverly, DO;  Location: [facility];  Service: Gastroenterology;  Laterality: N/A;   EXTRACORPOREAL SHOCK WAVE LITHOTRIPSY Left 02/23/2019   Procedure: EXTRACORPOREAL SHOCK WAVE LITHOTRIPSY (ESWL);  Surgeon: Alfredo Martinez, MD;  Location: [facility];  Service: Urology;  Laterality: Left;   HEMOSTASIS CLIP PLACEMENT  12/31/2021   Procedure: HEMOSTASIS CLIP PLACEMENT;  Surgeon: Shellia Cleverly, DO;  Location: [facility];  Service: Gastroenterology;;   INGUINAL HERNIA REPAIR Bilateral 06/06/2021   Procedure: LAPAROSCOPIC BILATERAL INGUINAL HERNIA REPAIRS WITH MESH;  Surgeon: Karie Soda, MD;  Location: [facility];  Service: General;  Laterality: Bilateral;  TAP BLOCK   LAPAROSCOPIC APPENDECTOMY N/A 03/07/2021   Procedure: APPENDECTOMY LAPAROSCOPIC;  Surgeon: Karie Soda, MD;  Location: [facility];  Service: General;  Laterality: N/A;   SUBMUCOSAL LIFTING INJECTION  12/31/2021   Procedure: SUBMUCOSAL LIFTING INJECTION;  Surgeon: Shellia Cleverly, DO;  Location: [facility];  Service: Gastroenterology;;   THYROIDECTOMY N/A 10/02/2022   Procedure: TOTAL THYROIDECTOMY;  Surgeon: Darnell Level, MD;  Location: [facility];  Service: General;  Laterality: N/A;    Prior to Admission medications   Medication Sig Start Date End Date Taking?  Authorizing Provider  atorvastatin (LIPITOR) 40 MG tablet Take 1 tablet (40 mg total) by mouth daily. 04/20/23  Yes Stephens, Amy J, NP  BREO ELLIPTA 100-25 MCG/ACT AEPB Inhale 1 puff into the lungs daily. 02/19/23  Yes Olalere, Adewale A, MD   Coenzyme Q10 (COQ10) 100 MG CAPS Take 100 mg by mouth in the morning.   Yes [provider]  levothyroxine (SYNTHROID) 112 MCG tablet Take 1 tablet (112 mcg total) by mouth daily. 03/30/23  Yes Shamleffer, Konrad Dolores, MD  losartan-hydrochlorothiazide (HYZAAR) 50-12.5 MG tablet Take 1 tablet by mouth daily. 04/20/23 07/19/23 Yes Zonia Kief, Amy J, NP  pantoprazole (PROTONIX) 40 MG tablet Take 1 tablet (40 mg total) by mouth daily before breakfast. 09/25/22  Yes Lylian Sanagustin V, DO  sertraline (ZOLOFT) 25 MG tablet Take 1 tablet (25 mg total) by mouth daily. 04/20/23 07/19/23 Yes Zonia Kief, Amy J, NP  albuterol (VENTOLIN HFA) 108 (90 Base) MCG/ACT inhaler Inhale 2 puffs into the lungs every 6 (six) hours as needed for wheezing or shortness of breath. Patient not taking: Reported on 04/08/2023 05/05/22   Tomma Lightning, MD  fluticasone (FLONASE) 50 MCG/ACT nasal spray Place 2 sprays into both nostrils daily. Patient taking differently: Place 2 sprays into both nostrils daily as needed for allergies or rhinitis. 04/02/22   Freddy Finner, NP    Current Outpatient Medications  Medication Sig Dispense Refill   atorvastatin (LIPITOR) 40 MG tablet Take 1 tablet (40 mg total) by mouth daily. 90 tablet 0   BREO ELLIPTA 100-25 MCG/ACT AEPB Inhale 1 puff into the lungs daily. 3 each 3   Coenzyme Q10 (COQ10) 100 MG CAPS Take 100 mg by mouth in the morning.     levothyroxine (SYNTHROID) 112 MCG tablet Take 1 tablet (112 mcg total) by mouth daily. 90 tablet 3   losartan-hydrochlorothiazide (HYZAAR) 50-12.5 MG tablet Take 1 tablet by mouth daily. 90 tablet 0   pantoprazole (PROTONIX) 40 MG tablet Take 1 tablet (40 mg total) by mouth daily before breakfast. 90 tablet 3   sertraline (ZOLOFT) 25 MG tablet Take 1 tablet (25 mg total) by mouth daily. 90 tablet 0   albuterol (VENTOLIN HFA) 108 (90 Base) MCG/ACT inhaler Inhale 2 puffs into the lungs every 6 (six) hours as needed for wheezing or shortness of  breath. (Patient not taking: Reported on 04/08/2023) 8 g 1   fluticasone (FLONASE) 50 MCG/ACT nasal spray Place 2 sprays into both nostrils daily. (Patient taking differently: Place 2 sprays into both nostrils daily as needed for allergies or rhinitis.) 16 g 0   Current Facility-Administered Medications  Medication Dose Route Frequency Provider Last Rate Last Admin   0.9 %  sodium chloride infusion  500 mL Intravenous Once Kiani Wurtzel V, DO        Allergies as of 04/29/2023   (No Known Allergies)    Family History  Problem Relation Age of Onset   Thyroid disease Mother    Thyroid disease Father    Thyroid disease Sister    Stomach cancer Neg Hx    Colon cancer Neg Hx    Esophageal cancer Neg Hx    Rectal cancer Neg Hx    Colon polyps Neg Hx     Social History   Socioeconomic History   Marital status: Single    Spouse name: Not on file   Number of children: Not on file   Years of education: Not on file   Highest education level: Bachelor's degree (e.g.,  BA, AB, BS)  Occupational History   Not on file  Tobacco Use   Smoking status: Former    Packs/day: 0.50    Years: 40.00    Additional pack years: 0.00    Total pack years: 20.00    Types: Cigarettes    Quit date: 03/29/2017    Years since quitting: 6.0    Passive exposure: Past   Smokeless tobacco: Never  Vaping Use   Vaping Use: Never used  Substance and Sexual Activity   Alcohol use: Yes    Comment: rare   Drug use: Never   Sexual activity: Not on file  Other Topics Concern   Not on file  Social History Narrative   Not on file   Social Determinants of Health   Financial Resource Strain: Low Risk  (04/16/2023)   Overall Financial Resource Strain (CARDIA)    Difficulty of Paying Living Expenses: Not hard at all  Food Insecurity: No Food Insecurity (04/16/2023)   Hunger Vital Sign    Worried About Running Out of Food in the Last Year: Never true    Ran Out of Food in the Last Year: Never true   Transportation Needs: No Transportation Needs (04/16/2023)   PRAPARE - Administrator, Civil Service (Medical): No    Lack of Transportation (Non-Medical): No  Physical Activity: Unknown (04/16/2023)   Exercise Vital Sign    Days of Exercise per Week: 5 days    Minutes of Exercise per Session: Patient declined  Stress: No Stress Concern Present (04/16/2023)   [employer] of Occupational Health - Occupational Stress Questionnaire    Feeling of Stress : Not at all  Social Connections: Moderately Integrated (04/16/2023)   Social Connection and Isolation Panel [NHANES]    Frequency of Communication with Friends and Family: More than three times a week    Frequency of Social Gatherings with Friends and Family: More than three times a week    Attends Religious Services: More than 4 times per year    Active Member of [employer] or Organizations: Yes    Attends Banker Meetings: More than 4 times per year    Marital Status: Never married  Intimate Partner Violence: Not At Risk (10/02/2022)   Humiliation, Afraid, Rape, and Kick questionnaire    Fear of Current or Ex-Partner: No    Emotionally Abused: No    Physically Abused: No    Sexually Abused: No    Physical Exam: Vital signs in last 24 hours: @BP  128/77   Pulse 95   Temp 98.4 F (36.9 C)   Ht 5\' 9"  (1.753 m)   Wt 185 lb (83.9 kg)   SpO2 96%   BMI 27.32 kg/m  GEN: NAD EYE: Sclerae anicteric ENT: MMM CV: Non-tachycardic Pulm: CTA b/l GI: Soft, NT/ND NEURO:  Alert & Oriented x 3   Doristine Locks, DO  Gastroenterology   04/29/2023 8:23 AM

## 2023-04-30 ENCOUNTER — Telehealth: Payer: Self-pay | Admitting: *Deleted

## 2023-04-30 NOTE — Telephone Encounter (Signed)
Ordering provider of PET scan Berniece Salines, MD to advise patient on follow-up and next steps.

## 2023-04-30 NOTE — Telephone Encounter (Signed)
  Follow up Call-     04/29/2023    7:49 AM 06/25/2022    1:22 PM 10/17/2021    7:48 AM  Call back number  Post procedure Call Back phone  # [phone]  Permission to leave phone message Yes Yes Yes     Patient questions:  Do you have a fever, pain , or abdominal swelling? No. Pain Score  0 *  Have you tolerated food without any problems? Yes.    Have you been able to return to your normal activities? Yes.    Do you have any questions about your discharge instructions: Diet   No. Medications  No. Follow up visit  No.  Do you have questions or concerns about your Care? No.  Actions: * If pain score is 4 or above: No action needed, pain <4.

## 2023-05-03 ENCOUNTER — Telehealth: Payer: Self-pay

## 2023-05-03 NOTE — Telephone Encounter (Signed)
    1. I confirmed with the patient he is aware of his referral to the clinic 5/28, arriving @ 12:45 pm.    2. I discussed the format of the clinic and the physicians he will be seeing that day.   3. I discussed where the clinic is located and how to contact me.   4. I confirmed his address and informed him I would be mailing a packet of information and forms to be completed. I asked him to bring them with him the day of his appointment.    He voiced understanding of the above. I asked him to call me if he has any questions or concerns regarding his appointments or the forms he needs to complete.

## 2023-05-06 ENCOUNTER — Telehealth: Payer: Self-pay | Admitting: Family

## 2023-05-06 NOTE — Telephone Encounter (Signed)
Contacted Gregg [name] to schedule their annual wellness visit. Welcome to Medicare visit Due by 02/19/24.  Rudell Cobb AWV direct phone # [phone]  WTM before 02/19/24 per [employer]

## 2023-05-10 ENCOUNTER — Encounter: Payer: Self-pay | Admitting: Gastroenterology

## 2023-05-13 ENCOUNTER — Encounter: Payer: Self-pay | Admitting: Gastroenterology

## 2023-05-13 NOTE — Progress Notes (Signed)
RN spoke with patient and confirmed upcoming PMDC appointment time and date.

## 2023-05-18 ENCOUNTER — Encounter: Payer: Self-pay | Admitting: Genetic Counselor

## 2023-05-18 ENCOUNTER — Other Ambulatory Visit: Payer: Self-pay | Admitting: Genetic Counselor

## 2023-05-18 ENCOUNTER — Ambulatory Visit
Admission: RE | Admit: 2023-05-18 | Discharge: 2023-05-18 | Disposition: A | Discharge status: Home / Self Care | Payer: HMO | Source: Ambulatory Visit | Attending: Radiation Oncology | Admitting: Radiation Oncology

## 2023-05-18 ENCOUNTER — Encounter: Payer: Self-pay | Admitting: Urology

## 2023-05-18 ENCOUNTER — Other Ambulatory Visit: Payer: Self-pay

## 2023-05-18 ENCOUNTER — Encounter: Payer: Self-pay | Admitting: Radiation Oncology

## 2023-05-18 ENCOUNTER — Inpatient Hospital Stay: Payer: HMO

## 2023-05-18 ENCOUNTER — Inpatient Hospital Stay: Payer: HMO | Attending: Radiation Oncology | Admitting: Genetic Counselor

## 2023-05-18 VITALS — BP 130/79 | HR 108 | Temp 97.5°F | Resp 20 | Ht 69.0 in | Wt 182.0 lb

## 2023-05-18 DIAGNOSIS — Z191 Hormone sensitive malignancy status: Secondary | ICD-10-CM | POA: Diagnosis not present

## 2023-05-18 DIAGNOSIS — Z808 Family history of malignant neoplasm of other organs or systems: Secondary | ICD-10-CM | POA: Diagnosis not present

## 2023-05-18 DIAGNOSIS — Z1379 Encounter for other screening for genetic and chromosomal anomalies: Secondary | ICD-10-CM

## 2023-05-18 DIAGNOSIS — Z8042 Family history of malignant neoplasm of prostate: Secondary | ICD-10-CM | POA: Diagnosis not present

## 2023-05-18 DIAGNOSIS — C61 Malignant neoplasm of prostate: Secondary | ICD-10-CM

## 2023-05-18 DIAGNOSIS — Z803 Family history of malignant neoplasm of breast: Secondary | ICD-10-CM | POA: Diagnosis not present

## 2023-05-18 DIAGNOSIS — Z8546 Personal history of malignant neoplasm of prostate: Secondary | ICD-10-CM | POA: Diagnosis not present

## 2023-05-18 DIAGNOSIS — Z8051 Family history of malignant neoplasm of kidney: Secondary | ICD-10-CM

## 2023-05-18 HISTORY — DX: Other hemorrhoids: K64.8

## 2023-05-18 LAB — GENETIC SCREENING ORDER

## 2023-05-18 NOTE — Consult Note (Signed)
Multi-Disciplinary Clinic     05/18/2023   --------------------------------------------------------------------------------   Gregg [name]  MRN: [id number]  DOB: 1956/01/26, 67 year old Male  SSN:    PRIMARY CARE:  Ricky Stabs, NP  PRIMARY CARE FAX:  [phone]  REFERRING:  Crist Fat, MD  PROVIDER:  Berniece Salines, M.D.  TREATING:  Heloise Purpura, M.D.  LOCATION:  [facility] [phone]     --------------------------------------------------------------------------------   CC/HPI: CC: Prostate Cancer   Physician requesting consult: Dr. Cordella Register  PCP: Ricky Stabs, NP  Location of consult: [facility] - Prostate Cancer [facility]   [name] is a 67 year old gentleman with a past medical history of asthma, GERD, hyperlipidemia, hypertension, depression, hypothyroidism, and urolithiasis. He was noted to have an indurated area along the right mid lateral prostate and an elevated PSA of 5.69. This prompted an MRI of the prostate on 03/15/23 that indicated a 1.5 cm PI-RADS 5 lesion of the right mid/apex peripheral zone. An MR/US fusion biopsy on 03/31/23 confirmed Gleason 4+4=8 adenocarcinoma of the prostate with all 3 targeted biopsies positive and 5 out of the 12 systematic biopsies positive.   Family history: His brother was diagnosed with prostate cancer at age 52.   Imaging studies:  MRI (03/15/23) - No EPE, SVI, LAD, or bone lesions.  PSMA PET scan (04/26/23) - Negative for metastatic disease. Uptake in the right prostate consistent with MR findings.   PMH: He has a history of asthma, GERD, hyperlipidemia, hypertension, depression, hypothyroidism, and urolithiasis  PSH: Laparoscopic appendectomy, laparoscopic cholecystectomy, and laparoscopic hernia repair by Dr. Michaell Cowing.   TNM stage: cT2a N0 M0  PSA: 5.69  Gleason score: 4+4=8 (GG4)  Biopsy (03/31/23): 8/15 cores positive  Left: L base (10%, 3+3=6)  Right: R mid  (40%, 3+4=7), R lateral mid (20%, 3+4=7), R base (10%, 3+3=6), R lateral base (30%, 4+4=8)  ROI: 3/3 cores (50%, 4+4=8 and 50%, 4+4=8 and 30%, 4+3=7)  Prostate volume: 65.0 cc   Nomogram  OC disease: 33%  EPE: 65%  SVI: 15%  LNI: 19%  PFS (5 year, 10 year): 41%, 27%   Urinary function: IPSS is 10.  Erectile function: SHIM score is 16. His partner has undergone a radical cystectomy by Dr. Berneice Heinrich in the past and does have erectile dysfunction. This is therefore a lower priority for him.     ALLERGIES: None    MEDICATIONS: Levofloxacin 750 mg tablet 1 tablet PO once Take on morning of the procedure  Levothyroxine Sodium 125 mcg capsule  Sildenafil Citrate 20 mg tablet 1-5 tablets daily as needed  Albuterol Sulfate  Atorvastatin Calcium 40 mg tablet  Breo Ellipta 100 mcg-25 mcg/dose blister, with inhalation device  Co Q-10  Levocetirizine Dihydrochloride 5 mg tablet  Losartan-Hydrochlorothiazide 50 mg-12.5 mg tablet  METHIMAZOLE PO Daily  Multiple Vitamin  Protonix  Zoloft 25 mg tablet     Notes: pt states he is taking another antibiotic but does not know the name of it.   GU PSH: ESWL, Left - 2020 Prostate Needle Biopsy - 03/31/2023       PSH Notes: Thyroid removed 09/2022   NON-GU PSH: Appendectomy (laparoscopic) - about 2022 Cholecystectomy (open) - about 12/21/2021 Colonoscopy - about 09/20/2021 Colonoscopy W/fb Remove - about 12/21/2021 Inguinal hernia repair (laparoscopic) - about 05/21/2021 Surgical Pathology, Gross And Microscopic Examination For Prostate Needle - 03/31/2023     GU PMH: Prostate Cancer - 04/08/2023 Prostate nodule w/ LUTS -  04/08/2023, - 02/09/2023, - 01/27/2022 Elevated PSA - 03/31/2023, - 02/09/2023, - 07/27/2022 ED due to arterial insufficiency - 07/27/2022 Nocturia - 07/27/2022, - 01/27/2022 Other dorsalgia - 2020 Renal calculus - 2020, - 2020    NON-GU PMH: Hyperthyroidism - 11/20/2021    FAMILY HISTORY: Atrial Fibrillation - Brother, Sister Breast  Cancer - Mother Death - Sister, Father, Mother heart - Father   SOCIAL HISTORY: Marital Status: Single Preferred Language: English; Ethnicity: Not Hispanic Or Latino; Race: White Current Smoking Status: Patient does not smoke anymore. Has not smoked since 03/22/2017. Smoked for 30 years. Smoked 1 pack per day.   Tobacco Use Assessment Completed: Used Tobacco in last 30 days? Drinks 1 caffeinated drink per day. Has not had a blood transfusion. Patient's occupation is/was [employer] Retired.    REVIEW OF SYSTEMS:    GU Review Male:   Patient denies frequent urination, hard to postpone urination, burning/ pain with urination, get up at night to urinate, leakage of urine, stream starts and stops, trouble starting your streams, and have to strain to urinate .  Gastrointestinal (Lower):   Patient denies diarrhea and constipation.  Gastrointestinal (Upper):   Patient denies vomiting and nausea.  Constitutional:   Patient denies fever, night sweats, weight loss, and fatigue.  Skin:   Patient denies skin rash/ lesion and itching.  Eyes:   Patient denies blurred vision and double vision.  Ears/ Nose/ Throat:   Patient denies sore throat and sinus problems.  Hematologic/Lymphatic:   Patient denies swollen glands and easy bruising.  Cardiovascular:   Patient denies leg swelling and chest pains.  Respiratory:   Patient denies cough and shortness of breath.  Endocrine:   Patient denies excessive thirst.  Musculoskeletal:   Patient denies back pain and joint pain.  Neurological:   Patient denies headaches and dizziness.  Psychologic:   Patient denies depression and anxiety.   VITAL SIGNS: None   GU PHYSICAL EXAMINATION:    Prostate: Prostate about 50 grams. He does have an indurated ridge along the right lateral aspect of the prostate consistent with his prior exam.   MULTI-SYSTEM PHYSICAL EXAMINATION:    Constitutional: Well-nourished. No physical deformities. Normally developed. Good grooming.   Respiratory: No labored breathing, no use of accessory muscles.   Cardiovascular: Normal temperature, normal extremity pulses, no swelling, no varicosities.   Gastrointestinal: No mass, no tenderness, no rigidity, non obese abdomen. He has multiple small laparoscopic incisions that are well-healed across his abdomen.     Complexity of Data:  Lab Test Review:   PSA  Records Review:   Pathology Reports, Previous Patient Records  X-Ray Review: PET- PSMA Scan: Reviewed Films.  MRI Abdomen: Reviewed Films.     01/26/23 07/27/22 01/27/22  PSA  Total PSA 5.69 ng/mL 4.05 ng/mL 4.55 ng/mL   Notes:                     CLINICAL DATA: Prostate carcinoma. PSA equal 5.69. high risk  prostate carcinoma with Gleason 4+4=8.   EXAM:  NUCLEAR MEDICINE PET SKULL BASE TO THIGH   TECHNIQUE:  9.1 mCi F18 Piflufolastat (Pylarify) was injected intravenously.  Full-ring PET imaging was performed from the skull base to thigh  after the radiotracer. CT data was obtained and used for attenuation  correction and anatomic localization.   COMPARISON: None Available.   FINDINGS:  NECK   No radiotracer activity in neck lymph nodes.   Incidental CT finding: None.   CHEST   No  radiotracer accumulation within mediastinal or hilar lymph nodes.  No suspicious pulmonary nodules on the CT scan.   Incidental CT finding: None.   ABDOMEN/PELVIS   Prostate: Focal intense radiotracer activity in the posterior medial  RIGHT lobe of the prostate gland corresponds to lesion on comparison  prostate MRI. No extracapsular extension.   Seminal vesicles are normal.   Lymph nodes: No abnormal radiotracer accumulation within pelvic or  abdominal nodes.   Liver: No evidence of liver metastasis.   Incidental CT finding: Atherosclerotic calcification of the aorta.   SKELETON   No focal activity to suggest skeletal metastasis.   IMPRESSION:  1. Focal radiotracer activity in the posterior medial RIGHT lobe of   the prostate gland consistent primary prostate adenocarcinoma.  Lesion corresponds to prostate MRI  2. No evidence of metastatic adenopathy in the pelvis or periaortic  retroperitoneum.  3. No evidence of visceral metastasis or skeletal metastasis.  4. Aortic Atherosclerosis (ICD10-I70.0).    Electronically Signed  By: Genevive Bi M.D.  On: 04/28/2023 10:57   PROCEDURES: None   ASSESSMENT:      ICD-10 Details  1 GU:   Prostate Cancer - C61    PLAN:           Schedule Return Visit/Planned Activity: Other See Visit Notes             Note: Will call to schedule surgery.          Document Letter(s):  Created for Patient: Clinical Summary   Created for Patient: Clinical Summary         Notes:   1. High risk prostate cancer: I had a detailed discussion with Mr. Degruy today regarding his prostate cancer situation. I did recommend therapy of curative intent and we reviewed options including standard therapies of primary surgical therapy versus long-term androgen deprivation with primary radiation therapy. The patient was counseled about the natural history of prostate cancer and the standard treatment options that are available for prostate cancer. It was explained to him how his age and life expectancy, clinical stage, Gleason score/prognostic grade group, and PSA (and PSA density) affect his prognosis, the decision to proceed with additional staging studies, as well as how that information influences recommended treatment strategies. We discussed the roles for active surveillance, radiation therapy, surgical therapy, androgen deprivation, as well as ablative therapy and other investigational options for the treatment of prostate cancer as appropriate to his individual cancer situation. We discussed the risks and benefits of these options with regard to their impact on cancer control and also in terms of potential adverse events, complications, and impact on quality of life particularly  related to urinary and sexual function. The patient was encouraged to ask questions throughout the discussion today and all questions were answered to his stated satisfaction. In addition, the patient was provided with and/or directed to appropriate resources and literature for further education about prostate cancer and treatment options. We discussed surgical therapy for prostate cancer including the different available surgical approaches. We discussed, in detail, the risks and expectations of surgery with regard to cancer control, urinary control, and erectile function as well as the expected postoperative recovery process. Additional risks of surgery including but not limited to bleeding, infection, hernia formation, nerve damage, lymphocele formation, bowel/rectal injury potentially necessitating colostomy, damage to the urinary tract resulting in urine leakage, urethral stricture, and the cardiopulmonary risks such as myocardial infarction, stroke, death, venothromboembolism, etc. were explained. The risk of open surgical conversion for robotic/laparoscopic  prostatectomy was also discussed.   Ultimately, he does wish to proceed with surgical therapy and will be scheduled for a unilateral left nerve sparing robot-assisted laparoscopic radical prostatectomy and bilateral pelvic lymphadenectomy. He has undergone prior laparoscopic bilateral inguinal hernia repairs by Dr. Michaell Cowing and we discussed the potential implications of this at the time of his surgery. He is prepared to proceed.   CC: Dr. Cordella Register  Ricky Stabs, NP  Dr. Margaretmary Dys           Next Appointment:      Next Appointment: 08/03/2023 08:30 AM    Appointment Type: Laboratory Appointment    Location: [facility] [phone]    Provider: Lab LAB    Reason for Visit: 6 mo PSA      E & M CODES: We spent 48 minutes dedicated to evaluation and management time, including face to face interaction, discussions on  coordination of care, documentation, result review, and discussion with others as applicable.

## 2023-05-18 NOTE — Progress Notes (Addendum)
                               Care Plan Summary  Name: Gregg [name] DOB: Nov 14, 1956   Your Medical Team:   Urologist -  Dr. Heloise Purpura, [facility]  Radiation Oncologist - Dr. Margaretmary Dys, [facility]     Recommendations: 1) Surgery     * These recommendations are based on information available as of today's consult.      Recommendations may change depending on the results of further tests or exams.    Next Steps: 1) You will be contacted by [facility] with an appointment time and date.     When appointments need to be scheduled, you will be contacted by [facility] and/or [facility].  Questions?  Please do not hesitate to call Cherlyn Cushing, BSN, RN at [phone] with any questions or concerns.  [name] is your Oncology Nurse Navigator and is available to assist you while you're receiving your medical care at [facility].

## 2023-05-18 NOTE — Progress Notes (Signed)
Radiation Oncology         [phone] ________________________________  Multidisciplinary Prostate Cancer Clinic  Initial Radiation Oncology Consultation  Name: Gregg [name] MRN: [id number]  Date: 05/18/2023  DOB: Jul 19, 1956  VH:[id number], Salomon Fick, NP  Crist Fat, MD   REFERRING PHYSICIAN: Crist Fat, MD  DIAGNOSIS: 67 y.o. 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 Malignant neoplasm of prostate (HCC)  C61       HISTORY OF PRESENT ILLNESS::Gregg [name] is a 67 y.o. gentleman with a diagnosis of prostate cancer. He has a history of kidney stones and was initially referred to Dr. Marlou Porch in 01/2022 for increased LUTS after starting Lasix. At that time, he was found to have a distinct ridge in the right mid lobe of his prostate on digital rectal examination, as well as an elevated PSA of 4.55. He was started on myrbetriq but unfortunately could not tolerate the medication due to heart palpitation. A repeat PSA in 07/2022 showed a slight decrease at 4.05, but PSA in 01/2023 showed an increase to 5.69. This prompted a prostate MRI which was performed on 03/15/23 showing a PI-RADS 5 lesion in the right peripheral zone.      The patient proceeded to MRI fusion biopsy of the prostate on 03/31/23.  The prostate volume measured 65 cc.  Out of 15 core biopsies, 8 were positive.  The maximum Gleason score was 4+4, and this was seen in two cores from the MRI ROI and the right base lateral. Additionally, Gleason 4+3 was seen in the third core from the MRI ROI, Gleason 3+4 in the right mid and right mid lateral, and Gleason 3+3 in the right base.    He underwent a staging PSMA PET scan on 04/26/23 showing no evidence of radiotracer activity outside of the prostate.    The patient reviewed the biopsy and imaging results with his urologist and he has kindly been referred today to the  multidisciplinary prostate cancer clinic for presentation of pathology and radiology studies in our conference for discussion of potential radiation treatment options and clinical evaluation.  PREVIOUS RADIATION THERAPY: No  PAST MEDICAL HISTORY:  has a past medical history of Anxiety, Asthma, Bilateral inguinal hernia (06/02/2021), Cancer (HCC) (04/05/2023), Chronic kidney disease, GERD (gastroesophageal reflux disease), Hemorrhoids, internal, History of kidney stones (2020), Hyperlipidemia, Hypertension, Sinus complaint (06/02/2021), Thyroid disease, and Wears glasses (06/02/2021).    PAST SURGICAL HISTORY: Past Surgical History:  Procedure Laterality Date   CHOLECYSTECTOMY     COLONOSCOPY WITH PROPOFOL N/A 12/31/2021   Procedure: COLONOSCOPY WITH PROPOFOL;  Surgeon: Shellia Cleverly, DO;  Location: [facility];  Service: Gastroenterology;  Laterality: N/A;   ENDOSCOPIC MUCOSAL RESECTION N/A 12/31/2021   Procedure: ENDOSCOPIC MUCOSAL RESECTION;  Surgeon: Shellia Cleverly, DO;  Location: [facility];  Service: Gastroenterology;  Laterality: N/A;   EXTRACORPOREAL SHOCK WAVE LITHOTRIPSY Left 02/23/2019   Procedure: EXTRACORPOREAL SHOCK WAVE LITHOTRIPSY (ESWL);  Surgeon: Alfredo Martinez, MD;  [facility];  Service: Urology;  Laterality: Left;   HEMOSTASIS CLIP PLACEMENT  12/31/2021   Procedure: HEMOSTASIS CLIP PLACEMENT;  Surgeon: Shellia Cleverly, DO;  [facility];  Service: Gastroenterology;;   INGUINAL HERNIA REPAIR Bilateral 06/06/2021   Procedure: LAPAROSCOPIC BILATERAL INGUINAL HERNIA REPAIRS WITH MESH;  Surgeon: Karie Soda, MD;  Location: [facility];  Service: General;  Laterality: Bilateral;  TAP BLOCK  LAPAROSCOPIC APPENDECTOMY N/A 03/07/2021   Procedure: APPENDECTOMY LAPAROSCOPIC;  Surgeon: Karie Soda, MD;  [facility];  Service: General;  Laterality: N/A;   PROSTATE BIOPSY     SUBMUCOSAL LIFTING INJECTION  12/31/2021   Procedure:  SUBMUCOSAL LIFTING INJECTION;  Surgeon: Shellia Cleverly, DO;  [facility];  Service: Gastroenterology;;   THYROIDECTOMY N/A 10/02/2022   Procedure: TOTAL THYROIDECTOMY;  Surgeon: Darnell Level, MD;  [facility];  Service: General;  Laterality: N/A;    FAMILY HISTORY: family history includes Bone cancer (age of onset: 63) in his brother; Breast cancer (age of onset: 58 - 52) in his mother; Kidney cancer (age of onset: 46 - 22) in his paternal grandfather; Prostate cancer (age of onset: 71) in his paternal uncle; Prostate cancer (age of onset: 71) in his brother; Thyroid cancer (age of onset: 28) in his father; Thyroid disease in his mother and sister.  SOCIAL HISTORY:  reports that he quit smoking about 6 years ago. His smoking use included cigarettes. He has a 20.00 pack-year smoking history. He has been exposed to tobacco smoke. He has never used smokeless tobacco. He reports current alcohol use. He reports that he does not use drugs.  ALLERGIES: Patient has no known allergies.  MEDICATIONS:  Current Outpatient Medications  Medication Sig Dispense Refill   levocetirizine (XYZAL) 5 MG tablet Take 10 mg by mouth every evening.     albuterol (VENTOLIN HFA) 108 (90 Base) MCG/ACT inhaler Inhale 2 puffs into the lungs every 6 (six) hours as needed for wheezing or shortness of breath. (Patient not taking: Reported on 04/08/2023) 8 g 1   atorvastatin (LIPITOR) 40 MG tablet Take 1 tablet (40 mg total) by mouth daily. 90 tablet 0   BREO ELLIPTA 100-25 MCG/ACT AEPB Inhale 1 puff into the lungs daily. 3 each 3   Coenzyme Q10 (COQ10) 100 MG CAPS Take 100 mg by mouth in the morning.     fluticasone (FLONASE) 50 MCG/ACT nasal spray Place 2 sprays into both nostrils daily. (Patient taking differently: Place 2 sprays into both nostrils daily as needed for allergies or rhinitis.) 16 g 0   levothyroxine (SYNTHROID) 112 MCG tablet Take 1 tablet (112 mcg total) by mouth daily. 90 tablet 3    losartan-hydrochlorothiazide (HYZAAR) 50-12.5 MG tablet Take 1 tablet by mouth daily. 90 tablet 0   pantoprazole (PROTONIX) 40 MG tablet Take 1 tablet (40 mg total) by mouth daily before breakfast. 90 tablet 3   sertraline (ZOLOFT) 25 MG tablet Take 1 tablet (25 mg total) by mouth daily. 90 tablet 0   No current facility-administered medications for this encounter.    REVIEW OF SYSTEMS:  On review of systems, the patient reports that he is doing well overall. He denies any chest pain, shortness of breath, cough, fevers, chills, night sweats, unintended weight changes. He denies any bowel disturbances, and denies abdominal pain, nausea or vomiting. He denies any new musculoskeletal or joint aches or pains. His IPSS was 10, indicating mild urinary symptoms. His SHIM was 16, indicating he has moderate erectile dysfunction. A complete review of systems is obtained and is otherwise negative.   PHYSICAL EXAM:  Wt Readings from Last 3 Encounters:  05/18/23 182 lb (82.6 kg)  04/29/23 185 lb (83.9 kg)  04/20/23 183 lb (83 kg)   Temp Readings from Last 3 Encounters:  05/18/23 (!) 97.5 F (36.4 C)  04/29/23 98.4 F (36.9 C)  01/13/23 98.3 F (36.8 C)   BP Readings from  Last 3 Encounters:  05/18/23 130/79  04/29/23 104/71  04/20/23 127/77   Pulse Readings from Last 3 Encounters:  05/18/23 (!) 108  04/29/23 79  04/20/23 87    /10  In general this is a well appearing Caucasian man in no acute distress. He's alert and oriented x4 and appropriate throughout the examination. Cardiopulmonary assessment is negative for acute distress and he exhibits normal effort.    KPS = 100  100 - Normal; no complaints; no evidence of disease. 90   - Able to carry on normal activity; minor signs or symptoms of disease. 80   - Normal activity with effort; some signs or symptoms of disease. 36   - Cares for self; unable to carry on normal activity or to do active work. 60   - Requires occasional assistance,  but is able to care for most of his personal needs. 50   - Requires considerable assistance and frequent medical care. 40   - Disabled; requires special care and assistance. 30   - Severely disabled; hospital admission is indicated although death not imminent. 20   - Very sick; hospital admission necessary; active supportive treatment necessary. 10   - Moribund; fatal processes progressing rapidly. 0     - Dead  Karnofsky DA, Abelmann WH, Craver LS and Burchenal [facility] [phone]) The use of the nitrogen mustards in the palliative treatment of carcinoma: with particular reference to bronchogenic carcinoma Cancer 1 [id number]   LABORATORY DATA:  Lab Results  Component Value Date   WBC 7.2 09/24/2022   HGB 16.5 09/24/2022   HCT 48.9 09/24/2022   MCV 94.2 09/24/2022   PLT 247 09/24/2022   Lab Results  Component Value Date   NA 142 01/13/2023   K 4.0 01/13/2023   CL 101 01/13/2023   CO2 22 01/13/2023   Lab Results  Component Value Date   ALT 43 01/08/2022   AST 33 01/08/2022   ALKPHOS 74 01/08/2022   BILITOT 0.6 01/08/2022     RADIOGRAPHY: NM PET (PSMA) SKULL TO MID THIGH  Result Date: 04/28/2023 CLINICAL DATA:  Prostate carcinoma. PSA equal 5.69. high risk prostate carcinoma with Gleason 4+4=8. EXAM: NUCLEAR MEDICINE PET SKULL BASE TO THIGH TECHNIQUE: 9.1 mCi F18 Piflufolastat (Pylarify) was injected intravenously. Full-ring PET imaging was performed from the skull base to thigh after the radiotracer. CT data was obtained and used for attenuation correction and anatomic localization. COMPARISON:  None Available. FINDINGS: NECK No radiotracer activity in neck lymph nodes. Incidental CT finding: None. CHEST No radiotracer accumulation within mediastinal or hilar lymph nodes. No suspicious pulmonary nodules on the CT scan. Incidental CT finding: None. ABDOMEN/PELVIS Prostate: Focal intense radiotracer activity in the posterior medial RIGHT lobe of the prostate gland corresponds to lesion on  comparison prostate MRI. No extracapsular extension. Seminal vesicles are normal. Lymph nodes: No abnormal radiotracer accumulation within pelvic or abdominal nodes. Liver: No evidence of liver metastasis. Incidental CT finding: Atherosclerotic calcification of the aorta. SKELETON No focal activity to suggest skeletal metastasis. IMPRESSION: 1. Focal radiotracer activity in the posterior medial RIGHT lobe of the prostate gland consistent primary prostate adenocarcinoma. Lesion corresponds to prostate MRI 2. No evidence of metastatic adenopathy in the pelvis or periaortic retroperitoneum. 3. No evidence of visceral metastasis or skeletal metastasis. 4.  Aortic Atherosclerosis (ICD10-I70.0). Electronically Signed   By: Genevive Bi M.D.   On: 04/28/2023 10:57      IMPRESSION/PLAN: 67 y.o. gentleman with Stage T2a adenocarcinoma of the prostate with a  Gleason score of 4+4 and a PSA of 5.7.    We discussed the patient's workup and outlined the nature of prostate cancer in this setting. The patient's T stage, Gleason's score, and PSA put him into the high risk group. Accordingly, he is eligible for a variety of potential treatment options including prostatectomy or LT-ADT concurrent with either 8 weeks of daily external radiation or 5 weeks of external radiation with an upfront brachytherapy boost. We discussed the available radiation techniques, and focused on the details and logistics of delivery. We discussed and outlined the risks, benefits, short and long-term effects associated with radiotherapy and compared and contrasted these with prostatectomy.   The patient focused most of his questions and interest in robotic-assisted laparoscopic radical prostatectomy.  We discussed some of the potential advantages of surgery including surgical staging, the availability of salvage radiotherapy to the prostatic fossa, and the confidence associated with immediate biochemical response. We discussed some of the  potential proven indications for postoperative radiotherapy including positive margins, extracapsular extension, and seminal vesicle involvement. We also talked about some of the other potential findings leading to a recommendation for radiotherapy including a non-zero postoperative PSA and positive lymph nodes. He appears to have a good understanding of his disease and our treatment recommendations which are of curative intent.  He was encouraged to ask questions that were answered to his stated satisfaction.  He will further discuss his surgical treatment options with Dr. Laverle Patter as part of the multidisciplinary prostate cancer clinic today as well and we will share our discussion with Dr. Marlou Porch. We enjoyed meeting him today and will look forward to following along in his care.  We personally spent 60 minutes in this encounter including chart review, reviewing radiological studies, meeting face-to-face with the patient, entering orders and completing documentation.    Marguarite Arbour, PA-C    Margaretmary Dys, MD  [facility]  Radiation Oncology Direct Dial: [phone]  Fax: [phone] [email or web address]  Skype  LinkedIn   This document serves as a record of services personally performed by Margaretmary Dys, MD and Marcello Fennel, PA-C. It was created on their behalf by Mickie Bail, a trained medical scribe. The creation of this record is based on the scribe's personal observations and the provider's statements to them. This document has been checked and approved by the attending provider.

## 2023-05-18 NOTE — Progress Notes (Signed)
REFERRING PROVIDER: Margaretmary Dys, MD  PRIMARY PROVIDER:  Rema Fendt, NP  PRIMARY REASON FOR VISIT:  1. Malignant neoplasm of prostate (HCC)   2. Family history of breast cancer   3. Family history of thyroid cancer     HISTORY OF PRESENT ILLNESS:   Mr. Carnahan, a 67 y.o. male, was seen for a [place] cancer genetics consultation at the request of Dr. Kathrynn Running due to a personal and family history of cancer.  Mr. Fleece presents to clinic today to discuss the possibility of a hereditary predisposition to cancer, to discuss genetic testing, and to further clarify his future cancer risks, as well as potential cancer risks for family members.   Mr. Shoemake was diagnosed with prostate cancer at age 70, Gleason score 8.   CANCER HISTORY:  Oncology History  Malignant neoplasm of prostate (HCC)  03/31/2023 Cancer Staging   Staging form: Prostate, AJCC 8th Edition - Clinical stage from 03/31/2023: Stage IIC (cT2a, cN0, cM0, PSA: 5.7, Grade Group: 4) - Signed by Marcello Fennel, PA-C on 05/18/2023 Histopathologic type: Adenocarcinoma, NOS Stage prefix: Initial diagnosis Prostate specific antigen (PSA) range: Less than 10 Gleason primary pattern: 4 Gleason secondary pattern: 4 Gleason score: 8 Histologic grading system: 5 grade system Number of biopsy cores examined: 15 Number of biopsy cores positive: 8 Location of positive needle core biopsies: One side   05/18/2023 Initial Diagnosis   Malignant neoplasm of prostate [facility])     Past Medical History:  Diagnosis Date   Anxiety    Asthma    Bilateral inguinal hernia 06/02/2021   Cancer (HCC) 04/05/2023   Prostrate CA early   Chronic kidney disease    GERD (gastroesophageal reflux disease)    Hemorrhoids, internal    History of kidney stones 2020   Hyperlipidemia    Hypertension    Sinus complaint 06/02/2021   x 1 week per pt   Thyroid disease    Wears glasses 06/02/2021    Past Surgical History:  Procedure Laterality  Date   CHOLECYSTECTOMY     COLONOSCOPY WITH PROPOFOL N/A 12/31/2021   Procedure: COLONOSCOPY WITH PROPOFOL;  Surgeon: Shellia Cleverly, DO;  [facility];  Service: Gastroenterology;  Laterality: N/A;   ENDOSCOPIC MUCOSAL RESECTION N/A 12/31/2021   Procedure: ENDOSCOPIC MUCOSAL RESECTION;  Surgeon: Shellia Cleverly, DO;  [facility];  Service: Gastroenterology;  Laterality: N/A;   EXTRACORPOREAL SHOCK WAVE LITHOTRIPSY Left 02/23/2019   Procedure: EXTRACORPOREAL SHOCK WAVE LITHOTRIPSY (ESWL);  Surgeon: Alfredo Martinez, MD;  [facility];  Service: Urology;  Laterality: Left;   HEMOSTASIS CLIP PLACEMENT  12/31/2021   Procedure: HEMOSTASIS CLIP PLACEMENT;  Surgeon: Shellia Cleverly, DO;  [facility];  Service: Gastroenterology;;   INGUINAL HERNIA REPAIR Bilateral 06/06/2021   Procedure: LAPAROSCOPIC BILATERAL INGUINAL HERNIA REPAIRS WITH MESH;  Surgeon: Karie Soda, MD;  [facility] ;  Service: General;  Laterality: Bilateral;  TAP BLOCK   LAPAROSCOPIC APPENDECTOMY N/A 03/07/2021   Procedure: APPENDECTOMY LAPAROSCOPIC;  Surgeon: Karie Soda, MD;  [facility];  Service: General;  Laterality: N/A;   PROSTATE BIOPSY     SUBMUCOSAL LIFTING INJECTION  12/31/2021   Procedure: SUBMUCOSAL LIFTING INJECTION;  Surgeon: Shellia Cleverly, DO;  [facility];  Service: Gastroenterology;;   THYROIDECTOMY N/A 10/02/2022   Procedure: TOTAL THYROIDECTOMY;  Surgeon: Darnell Level, MD;  [facility];  Service: General;  Laterality: N/A;    Social History   Socioeconomic History   Marital status:  Single    Spouse name: [name]   Number of children: 0   Years of education: Not on file   Highest education level: Bachelor's degree (e.g., BA, AB, BS)  Occupational History   Occupation: [employer]ing [employer]  Tobacco Use   Smoking status: Former    Packs/day: 0.50    Years: 40.00    Additional pack years: 0.00     Total pack years: 20.00    Types: Cigarettes    Quit date: 03/29/2017    Years since quitting: 6.1    Passive exposure: Past   Smokeless tobacco: Never  Vaping Use   Vaping Use: Never used  Substance and Sexual Activity   Alcohol use: Yes    Comment: rare   Drug use: Never   Sexual activity: Not on file  Other Topics Concern   Not on file  Social History Narrative   Not on file   Social Determinants of Health   Financial Resource Strain: Low Risk  (04/16/2023)   Overall Financial Resource Strain (CARDIA)    Difficulty of Paying Living Expenses: Not hard at all  Food Insecurity: No Food Insecurity (04/16/2023)   Hunger Vital Sign    Worried About Running Out of Food in the Last Year: Never true    Ran Out of Food in the Last Year: Never true  Transportation Needs: No Transportation Needs (04/16/2023)   PRAPARE - Administrator, Civil Service (Medical): No    Lack of Transportation (Non-Medical): No  Physical Activity: Unknown (04/16/2023)   Exercise Vital Sign    Days of Exercise per Week: 5 days    Minutes of Exercise per Session: Patient declined  Stress: No Stress Concern Present (04/16/2023)   [employer] of Occupational Health - Occupational Stress Questionnaire    Feeling of Stress : Not at all  Social Connections: Moderately Integrated (04/16/2023)   Social Connection and Isolation Panel [NHANES]    Frequency of Communication with Friends and Family: More than three times a week    Frequency of Social Gatherings with Friends and Family: More than three times a week    Attends Religious Services: More than 4 times per year    Active Member of [employer] or Organizations: Yes    Attends Engineer, structural: More than 4 times per year    Marital Status: Never married     FAMILY HISTORY:  We obtained a detailed, 4-generation family history.  Significant diagnoses are listed below: Family History  Problem Relation Age of Onset   Thyroid disease  Mother    Breast cancer Mother 36 - 80   Thyroid cancer Father 2   Thyroid disease Sister    Prostate cancer Brother 67   Bone cancer Brother 50       agent orange exposure   Prostate cancer Paternal Uncle 81   Kidney cancer Paternal Grandfather 18 - 72   Stomach cancer Neg Hx    Colon cancer Neg Hx    Esophageal cancer Neg Hx    Rectal cancer Neg Hx    Colon polyps Neg Hx      Mr. Oborny brother was diagnosed with bone cancer at age 35 and prostate cancer at age 41, he was exposed to agent orange. His mother was diagnosed with breast cancer in her early 82s, she is deceased. Mr. Grisso father was diagnosed with thyroid cancer at age 50, he died due to metastatic thyroid cancer at age 32. His paternal  uncle was diagnosed with prostate cancer at age 16, he died at age 19. His paternal grandfather was diagnosed with kidney cancer in his 60s, he died due to metastatic kidney cancer in his 41s. Mr. Batz is unaware of previous family history of genetic testing for hereditary cancer risks. There is no reported Ashkenazi Jewish ancestry.   GENETIC COUNSELING ASSESSMENT: Mr. Entrikin is a 67 y.o. male with a personal and family history of cancer which is somewhat suggestive of a hereditary predisposition to cancer. We, therefore, discussed and recommended the following at today's visit.   DISCUSSION: We discussed that 5 - 10% of cancer is hereditary, with most cases of prostate cancer associated with BRCA1/2.  There are other genes that can be associated with hereditary prostate cancer syndromes.  We discussed that testing is beneficial for several reasons including knowing how to follow individuals after completing their treatment, identifying whether potential treatment options would be beneficial, and understanding if other family members could be at risk for cancer and allowing them to undergo genetic testing.   We reviewed the characteristics, features and inheritance patterns of hereditary  cancer syndromes. We also discussed genetic testing, including the appropriate family members to test, the process of testing, insurance coverage and turn-around-time for results. We discussed the implications of a negative, positive, carrier and/or variant of uncertain significant result. We recommended Mr. Wilker pursue genetic testing for a panel that includes genes associated with prostate, breast, thyroid, and kidney cancer.   Mr. Bergemann elected to have [facility] Custom Panel.***  Based on Mr. Du personal and family history of cancer, he meets medical criteria for genetic testing. Despite that he meets criteria, he may still have an out of pocket cost. We discussed that if his out of pocket cost for testing is over $100, the laboratory will call and confirm whether he wants to proceed with testing.  If the out of pocket cost of testing is less than $100 he will be billed by the genetic testing laboratory.   PLAN: After considering the risks, benefits, and limitations, Mr. Harkleroad provided informed consent to pursue genetic testing and the blood sample was sent to [facility] for analysis of the Custom Panel. Results should be available within approximately 2-3 weeks' time, at which point they will be disclosed by telephone to Mr. Seidl, as will any additional recommendations warranted by these results. Mr. Eggum will receive a summary of his genetic counseling visit and a copy of his results once available. This information will also be available in Epic.   Mr. Hertel questions were answered to his satisfaction today. Our contact information was provided should additional questions or concerns arise. Thank you for the referral and allowing [place] to share in the care of your patient.   Lalla Brothers, MS, [facility] Genetic Counselor [email or web address] (P) [phone]  The patient was seen for a total of 20 minutes in face-to-face genetic counseling. The patient was seen alone.  Drs.  Pamelia Hoit and/or Mosetta Putt were available to discuss this case as needed.   _______________________________________________________________________ For Office Staff:  Number of people involved in session: 1 Was an Intern/ student involved with case: no

## 2023-05-25 NOTE — Progress Notes (Signed)
RN spoke with patient to follow up after recent [facility] appointment.  Patient aware that we are awaiting surgery date from [facility].  No additional needs at this time. Plan of care in progress.

## 2023-05-27 ENCOUNTER — Other Ambulatory Visit: Payer: Self-pay | Admitting: Urology

## 2023-05-27 ENCOUNTER — Encounter (HOSPITAL_COMMUNITY): Payer: Self-pay

## 2023-05-28 ENCOUNTER — Encounter: Payer: Self-pay | Admitting: Genetic Counselor

## 2023-05-28 ENCOUNTER — Telehealth: Payer: Self-pay | Admitting: Genetic Counselor

## 2023-05-28 DIAGNOSIS — Z1379 Encounter for other screening for genetic and chromosomal anomalies: Secondary | ICD-10-CM | POA: Insufficient documentation

## 2023-05-28 NOTE — Telephone Encounter (Signed)
I attempted to contact Mr. Duran to discuss his genetic testing results (52 genes). I left a voicemail requesting he call me back at [phone].  Lalla Brothers, MS, [facility] Genetic Counselor [email or web address] (P) [phone],

## 2023-06-01 ENCOUNTER — Telehealth: Payer: Self-pay | Admitting: Genetic Counselor

## 2023-06-01 DIAGNOSIS — C61 Malignant neoplasm of prostate: Secondary | ICD-10-CM | POA: Diagnosis not present

## 2023-06-01 NOTE — Telephone Encounter (Signed)
I contacted Mr. Yasui to discuss his genetic testing results. No pathogenic variants were identified in the 52 genes analyzed. Detailed clinic note to follow.  The test report has been scanned into EPIC and is located under the Molecular Pathology section of the Results Review tab.  A portion of the result report is included below for reference.   Lalla Brothers, MS, [facility] Genetic Counselor [email or web address] (P) [phone]

## 2023-06-03 DIAGNOSIS — M6281 Muscle weakness (generalized): Secondary | ICD-10-CM | POA: Diagnosis not present

## 2023-06-03 DIAGNOSIS — C61 Malignant neoplasm of prostate: Secondary | ICD-10-CM | POA: Diagnosis not present

## 2023-06-04 ENCOUNTER — Ambulatory Visit: Payer: Self-pay | Admitting: Genetic Counselor

## 2023-06-04 ENCOUNTER — Encounter: Payer: Self-pay | Admitting: Genetic Counselor

## 2023-06-04 DIAGNOSIS — Z1379 Encounter for other screening for genetic and chromosomal anomalies: Secondary | ICD-10-CM

## 2023-06-04 NOTE — Progress Notes (Signed)
HPI:   Gregg [name] was previously seen in the [facility] due to a personal and family history of cancer and concerns regarding a hereditary predisposition to cancer. Please refer to our prior cancer genetics clinic note for more information regarding our discussion, assessment and recommendations, at the time. Gregg [name] recent genetic test results were disclosed to him, as were recommendations warranted by these results. These results and recommendations are discussed in more detail below.  CANCER HISTORY:  Oncology History  Malignant neoplasm of prostate (HCC)  03/31/2023 Cancer Staging   Staging form: Prostate, AJCC 8th Edition - Clinical stage from 03/31/2023: Stage IIC (cT2a, cN0, cM0, PSA: 5.7, Grade Group: 4) - Signed by Gregg Fennel, PA-C on 05/18/2023 Histopathologic type: Adenocarcinoma, NOS Stage prefix: Initial diagnosis Prostate specific antigen (PSA) range: Less than 10 Gleason primary pattern: 4 Gleason secondary pattern: 4 Gleason score: 8 Histologic grading system: 5 grade system Number of biopsy cores examined: 15 Number of biopsy cores positive: 8 Location of positive needle core biopsies: One side   05/18/2023 Initial Diagnosis   Malignant neoplasm of prostate (HCC)     FAMILY HISTORY:  We obtained a detailed, 4-generation family history.  Significant diagnoses are listed below:      Family History  Problem Relation Age of Onset   Thyroid disease Mother     Breast cancer Mother 84 - 9   Thyroid cancer Father 56   Thyroid disease Sister     Prostate cancer Brother 65   Bone cancer Brother 50        agent orange exposure   Prostate cancer Paternal Uncle 1   Kidney cancer Paternal Grandfather 72 - 37   Stomach cancer Neg Hx     Colon cancer Neg Hx     Esophageal cancer Neg Hx     Rectal cancer Neg Hx     Colon polyps Neg Hx         Gregg [name] brother was diagnosed with bone cancer at age 24 and prostate cancer at age 55, he was  exposed to agent orange. His mother was diagnosed with breast cancer in her early 73s, she is deceased. Gregg [name] father was diagnosed with thyroid cancer at age 38, he died due to metastatic thyroid cancer at age 80. His paternal uncle was diagnosed with prostate cancer at age 37, he died at age 76. His paternal grandfather was diagnosed with kidney cancer in his 33s, he died due to metastatic kidney cancer in his 36s. Gregg [name] is unaware of previous family history of genetic testing for hereditary cancer risks. There is no reported Ashkenazi Jewish ancestry.   GENETIC TEST RESULTS:  The [employer] Custom Panel found no pathogenic mutations.  [employer] Custom Panel includes sequencing and/or deletion duplication testing of the following 52 genes: APC, ATM, AXIN2, BAP1, BARD1, BMPR1A, BRCA1, BRCA2, BRIP1, CDH1, CDK4, CDKN2A (p14ARF and p16INK4a only), CHEK2, CTNNA1, DICER1, EPCAM (Deletion/duplication testing only), FLCN, FH, GREM1 (promoter region duplication testing only), HOXB13, KIT, MBD4, MEN1, MET, MLH1, MSH2, MSH3, MSH6, MUTYH, NF1, NHTL1, PALB2, PDGFRA, PRKAR1A, PMS2, POLD1, POLE, PTEN, RAD51C, RAD51D, RET, SDHA (sequencing analysis only except exon 14), SDHB, SDHC, SDHD, SMAD4, SMARCA4. STK11, TP53, TSC1, TSC2, and VHL   The test report has been scanned into EPIC and is located under the Molecular Pathology section of the Results Review tab.  A portion of the [name] report is included below for reference. Genetic testing reported out on 05/25/2023.  Even though a pathogenic variant was not identified, possible explanations for the cancer in the family may include: There may be no hereditary risk for cancer in the family. The cancers in Gregg [name] and/or his family may be due to other genetic or environmental factors. There may be a gene mutation in one of these genes that current testing methods cannot detect, but that chance is small. There could be another gene that has not yet been  discovered, or that we have not yet tested, that is responsible for the cancer diagnoses in the family.  It is also possible there is a hereditary cause for the cancer in the family that Gregg [name] did not inherit.  Therefore, it is important to remain in touch with cancer genetics in the future so that we can continue to offer Gregg [name] the most up to date genetic testing.   ADDITIONAL GENETIC TESTING:  We discussed with Gregg [name] that his genetic testing was fairly extensive.  If there are genes identified to increase cancer risk that can be analyzed in the future, we would be happy to discuss and coordinate this testing at that time.    CANCER SCREENING RECOMMENDATIONS:  Gregg [name] is considered negative (normal).  This means that we have not identified a hereditary cause for his personal and family history of cancer at this time.   An individual's cancer risk and medical management are not determined by genetic test results alone. Overall cancer risk assessment incorporates additional factors, including personal medical history, family history, and any available genetic information that may [name] in a personalized plan for cancer prevention and surveillance. Therefore, it is recommended he continue to follow the cancer management and screening guidelines provided by his oncology and primary healthcare provider.  FOLLOW-UP:  Cancer genetics is a rapidly advancing field and it is possible that new genetic tests will be appropriate for him and/or his family members in the future. We encouraged him to remain in contact with cancer genetics on an annual basis so we can update his personal and family histories and let him know of advances in cancer genetics that may benefit this family.   Our contact number was provided. Gregg [name] questions were answered to his satisfaction, and he knows he is welcome to call us at anytime with additional questions or concerns.   Lalla Brothers, MS,  [facility] Genetic Counselor [email or web address] (P) [phone]

## 2023-06-11 NOTE — Patient Instructions (Signed)
DUE TO COVID-19 ONLY TWO VISITORS  (aged 65 and older)  ARE ALLOWED TO COME WITH YOU AND STAY IN THE WAITING ROOM ONLY DURING PRE OP AND PROCEDURE.   **NO VISITORS ARE ALLOWED IN THE SHORT STAY AREA OR RECOVERY ROOM!!**  IF YOU WILL BE ADMITTED INTO THE HOSPITAL YOU ARE ALLOWED ONLY FOUR SUPPORT PEOPLE DURING VISITATION HOURS ONLY (7 AM -8PM)   The support person(s) must pass our screening, gel in and out, and wear a mask at all times, including in the patient's room. Patients must also wear a mask when staff or their support person are in the room. Visitors GUEST BADGE MUST BE WORN VISIBLY  One adult visitor may remain with you overnight and MUST be in the room by 8 P.M.     Your procedure is scheduled on: 06/28/23   Report to [facility] Main Entrance    Report to admitting at : 9:00 AM   Call this number if you have problems the morning of surgery [phone]   Clear liquids starting the day before surgery until : 8:00 AM DAY OF SURGERY  Water Black Coffee (sugar ok, NO MILK/CREAM OR CREAMERS)  Tea (sugar ok, NO MILK/CREAM OR CREAMERS) regular and decaf                             Plain Jell-O (NO RED)                                           Fruit ices (not with fruit pulp, NO RED)                                     Popsicles (NO RED)                                                                  Juice: apple, WHITE grape, WHITE cranberry Sports drinks like Gatorade (NO RED)              FOLLOW BOWEL PREP AND ANY ADDITIONAL PRE OP INSTRUCTIONS YOU RECEIVED FROM YOUR SURGEON'S OFFICE!!!   Oral Hygiene is also important to reduce your risk of infection.                                    Remember - BRUSH YOUR TEETH THE MORNING OF SURGERY WITH YOUR REGULAR TOOTHPASTE  DENTURES WILL BE REMOVED PRIOR TO SURGERY PLEASE DO NOT APPLY "Poly grip" OR ADHESIVES!!!   Do NOT smoke after Midnight   Take these medicines the morning of surgery with A SIP OF WATER:  sertraline,levothyroxine.Use inhalers as usual.                              You may not have any metal on your body including hair pins, jewelry, and body piercing             Do  not wear lotions, powders, perfumes/cologne, or deodorant              Men may shave face and neck.   Do not bring valuables to the hospital. [facility] IS NOT             RESPONSIBLE   FOR VALUABLES.   Contacts, glasses, or bridgework may not be worn into surgery.   Bring small overnight bag day of surgery.   DO NOT BRING YOUR HOME MEDICATIONS TO THE HOSPITAL. PHARMACY WILL DISPENSE MEDICATIONS LISTED ON YOUR MEDICATION LIST TO YOU DURING YOUR ADMISSION IN THE HOSPITAL!    Patients discharged on the day of surgery will not be allowed to drive home.  Someone NEEDS to stay with you for the first 24 hours after anesthesia.   Special Instructions: Bring a copy of your healthcare power of attorney and living will documents         the day of surgery if you haven't scanned them before.              Please read over the following fact sheets you were given: IF YOU HAVE QUESTIONS ABOUT YOUR PRE-OP INSTRUCTIONS PLEASE CALL [phone]    [facility] - Preparing for Surgery Before surgery, you can play an important role.  Because skin is not sterile, your skin needs to be as free of germs as possible.  You can reduce the number of germs on your skin by washing with CHG (chlorahexidine gluconate) soap before surgery.  CHG is an antiseptic cleaner which kills germs and bonds with the skin to continue killing germs even after washing. Please DO NOT use if you have an allergy to CHG or antibacterial soaps.  If your skin becomes reddened/irritated stop using the CHG and inform your nurse when you arrive at Short Stay. Do not shave (including legs and underarms) for at least 48 hours prior to the first CHG shower.  You may shave your face/neck. Please follow these instructions carefully:  1.  Shower with CHG Soap the night  before surgery and the  morning of Surgery.  2.  If you choose to wash your hair, wash your hair first as usual with your  normal  shampoo.  3.  After you shampoo, rinse your hair and body thoroughly to remove the  shampoo.                           4.  Use CHG as you would any other liquid soap.  You can apply chg directly  to the skin and wash                       Gently with a scrungie or clean washcloth.  5.  Apply the CHG Soap to your body ONLY FROM THE NECK DOWN.   Do not use on face/ open                           Wound or open sores. Avoid contact with eyes, ears mouth and genitals (private parts).                       Wash face,  Genitals (private parts) with your normal soap.             6.  Wash thoroughly, paying special attention to the area where your surgery  will  be performed.  7.  Thoroughly rinse your body with warm water from the neck down.  8.  DO NOT shower/wash with your normal soap after using and rinsing off  the CHG Soap.                9.  Pat yourself dry with a clean towel.            10.  Wear clean pajamas.            11.  Place clean sheets on your bed the night of your first shower and do not  sleep with pets. Day of Surgery : Do not apply any lotions/deodorants the morning of surgery.  Please wear clean clothes to the hospital/surgery center.  FAILURE TO FOLLOW THESE INSTRUCTIONS MAY RESULT IN THE CANCELLATION OF YOUR SURGERY PATIENT SIGNATURE_________________________________  NURSE SIGNATURE__________________________________  ________________________________________________________________________ WHAT IS A BLOOD TRANSFUSION? Blood Transfusion Information  A transfusion is the replacement of blood or some of its parts. Blood is made up of multiple cells which provide different functions. Red blood cells carry oxygen and are used for blood loss replacement. White blood cells fight against infection. Platelets control bleeding. Plasma helps clot  blood. Other blood products are available for specialized needs, such as hemophilia or other clotting disorders. BEFORE THE TRANSFUSION  Who gives blood for transfusions?  Healthy volunteers who are fully evaluated to make sure their blood is safe. This is blood bank blood. Transfusion therapy is the safest it has ever been in the practice of medicine. Before blood is taken from a donor, a complete history is taken to make sure that person has no history of diseases nor engages in risky social behavior (examples are intravenous drug use or sexual activity with multiple partners). The donor's travel history is screened to minimize risk of transmitting infections, such as malaria. The donated blood is tested for signs of infectious diseases, such as HIV and hepatitis. The blood is then tested to be sure it is compatible with you in order to minimize the chance of a transfusion reaction. If you or a relative donates blood, this is often done in anticipation of surgery and is not appropriate for emergency situations. It takes many days to process the donated blood. RISKS AND COMPLICATIONS Although transfusion therapy is very safe and saves many lives, the main dangers of transfusion include:  Getting an infectious disease. Developing a transfusion reaction. This is an allergic reaction to something in the blood you were given. Every precaution is taken to prevent this. The decision to have a blood transfusion has been considered carefully by your caregiver before blood is given. Blood is not given unless the benefits outweigh the risks. AFTER THE TRANSFUSION Right after receiving a blood transfusion, you will usually feel much better and more energetic. This is especially true if your red blood cells have gotten low (anemic). The transfusion raises the level of the red blood cells which carry oxygen, and this usually causes an energy increase. The nurse administering the transfusion will monitor you  carefully for complications. HOME CARE INSTRUCTIONS  No special instructions are needed after a transfusion. You may find your energy is better. Speak with your caregiver about any limitations on activity for underlying diseases you may have. SEEK MEDICAL CARE IF:  Your condition is not improving after your transfusion. You develop redness or irritation at the intravenous (IV) site. SEEK IMMEDIATE MEDICAL CARE IF:  Any of the following symptoms occur over the next 12  hours: Shaking chills. You have a temperature by mouth above 102 F (38.9 C), not controlled by medicine. Chest, back, or muscle pain. People around you feel you are not acting correctly or are confused. Shortness of breath or difficulty breathing. Dizziness and fainting. You get a rash or develop hives. You have a decrease in urine output. Your urine turns a dark color or changes to pink, red, or brown. Any of the following symptoms occur over the next 10 days: You have a temperature by mouth above 102 F (38.9 C), not controlled by medicine. Shortness of breath. Weakness after normal activity. The white part of the eye turns yellow (jaundice). You have a decrease in the amount of urine or are urinating less often. Your urine turns a dark color or changes to pink, red, or brown. Document Released: 12/04/2000 Document Revised: 02/29/2012 Document Reviewed: 07/23/2008 [facility] Patient Information 2014 [place], [place].  _______________________________________________________________________

## 2023-06-14 DIAGNOSIS — M62838 Other muscle spasm: Secondary | ICD-10-CM | POA: Diagnosis not present

## 2023-06-14 DIAGNOSIS — M6281 Muscle weakness (generalized): Secondary | ICD-10-CM | POA: Diagnosis not present

## 2023-06-14 DIAGNOSIS — N393 Stress incontinence (female) (male): Secondary | ICD-10-CM | POA: Diagnosis not present

## 2023-06-15 ENCOUNTER — Encounter (HOSPITAL_COMMUNITY)
Admission: RE | Admit: 2023-06-15 | Discharge: 2023-06-15 | Disposition: A | Discharge status: Home / Self Care | Payer: HMO | Source: Ambulatory Visit | Attending: Urology | Admitting: Urology

## 2023-06-15 ENCOUNTER — Encounter (HOSPITAL_COMMUNITY): Payer: Self-pay

## 2023-06-15 ENCOUNTER — Other Ambulatory Visit: Payer: Self-pay | Admitting: Family

## 2023-06-15 ENCOUNTER — Other Ambulatory Visit: Payer: Self-pay

## 2023-06-15 VITALS — BP 145/79 | HR 87 | Temp 98.8°F | Ht 69.0 in | Wt 180.0 lb

## 2023-06-15 DIAGNOSIS — I10 Essential (primary) hypertension: Secondary | ICD-10-CM | POA: Diagnosis not present

## 2023-06-15 DIAGNOSIS — Z01818 Encounter for other preprocedural examination: Secondary | ICD-10-CM | POA: Insufficient documentation

## 2023-06-15 HISTORY — DX: Bifascicular block: I45.2

## 2023-06-15 HISTORY — DX: Unspecified osteoarthritis, unspecified site: M19.90

## 2023-06-15 LAB — TYPE AND SCREEN
ABO/RH(D): A NEG
Antibody Screen: NEGATIVE

## 2023-06-15 LAB — BASIC METABOLIC PANEL
Anion gap: 10 (ref 5–15)
BUN: 16 mg/dL (ref 8–23)
CO2: 25 mmol/L (ref 22–32)
Calcium: 9.2 mg/dL (ref 8.9–10.3)
Chloride: 101 mmol/L (ref 98–111)
Creatinine, Ser: 1 mg/dL (ref 0.61–1.24)
GFR, Estimated: >60 mL/min (ref >60)
Glucose, Bld: 114 mg/dL — ABNORMAL HIGH (ref 70–99)
Potassium: 3.4 mmol/L — ABNORMAL LOW (ref 3.5–5.1)
Sodium: 136 mmol/L (ref 135–145)

## 2023-06-15 LAB — CBC
HCT: 48.4 % (ref 39.0–52.0)
Hemoglobin: 16 g/dL (ref 13.0–17.0)
MCH: 31.2 pg (ref 26.0–34.0)
MCHC: 33.1 g/dL (ref 30.0–36.0)
MCV: 94.3 fL (ref 80.0–100.0)
Platelets: 238 10*3/uL (ref 150–400)
RBC: 5.13 MIL/uL (ref 4.22–5.81)
RDW: 12.6 % (ref 11.5–15.5)
WBC: 7.1 10*3/uL (ref 4.0–10.5)
nRBC: 0 % (ref 0.0–0.2)

## 2023-06-15 NOTE — Telephone Encounter (Signed)
Requested Prescriptions  Pending Prescriptions Disp Refills   losartan-hydrochlorothiazide (HYZAAR) 50-12.5 MG tablet [Pharmacy Med Name: LOSARTAN-HCTZ 50-12.5 MG TAB] 90 tablet 1    Sig: TAKE 1 TABLET BY MOUTH EVERY DAY     Cardiovascular: ARB + Diuretic Combos Passed - 06/15/2023  2:38 AM      Passed - K in normal range and within 180 days    Potassium  Date Value Ref Range Status  06/15/2023 3.4 (L) 3.5 - 5.1 mmol/L Final         Passed - Na in normal range and within 180 days    Sodium  Date Value Ref Range Status  06/15/2023 136 135 - 145 mmol/L Final  01/13/2023 142 134 - 144 mmol/L Final         Passed - Cr in normal range and within 180 days    Creatinine, Ser  Date Value Ref Range Status  06/15/2023 1.00 0.61 - 1.24 mg/dL Final         Passed - eGFR is 10 or above and within 180 days    GFR calc Af Amer  Date Value Ref Range Status  02/19/2019 >60 >60 mL/min Final   GFR, Estimated  Date Value Ref Range Status  06/15/2023 >60 >60 mL/min Final    Comment:    (NOTE) Calculated using the CKD-EPI Creatinine Equation (2021)    eGFR  Date Value Ref Range Status  01/13/2023 80 >59 mL/min/1.73 Final         Passed - Patient is not pregnant      Passed - Last BP in normal range    BP Readings from Last 1 Encounters:  06/15/23 (!) 145/79         Passed - Valid encounter within last 6 months    Recent Outpatient Visits           1 month ago Primary hypertension   [facility], [place], [place]   5 months ago Primary hypertension   [place] [facility], [place], [place]   8 months ago Primary hypertension   [place] [facility], [place], [place]   8 months ago Hospital discharge follow-up   [facility] [place] [facility], Amy J, NP   11 months ago Essential (primary) hypertension   [place] [facility], [place], [place]        Future Appointments             In 4 months Rema Fendt, NP [facility] at [facility]

## 2023-06-15 NOTE — Progress Notes (Signed)
For Short Stay: COVID SWAB appointment date:  Bowel Prep reminder:   For Anesthesia: PCP - Ricky Stabs: NP Cardiologist - CT Chest: 09/18/22 Dr. Carolan Clines. LOV: 06/29/22 Chest x-ray -  EKG -  Stress Test -  ECHO -  Cardiac Cath -  Pacemaker/ICD device last checked: Pacemaker orders received: Device Rep notified:  Spinal Cord Stimulator: N/A  Sleep Study - N/A CPAP -   Fasting Blood Sugar - N/A Checks Blood Sugar _____ times a day Date and result of last Hgb A1c-  Last dose of GLP1 agonist- N/A GLP1 instructions:   Last dose of SGLT-2 inhibitors- N/A SGLT-2 instructions:   Blood Thinner Instructions:N/A Aspirin Instructions: Last Dose:  Activity level: Can go up a flight of stairs and activities of daily living without stopping and without chest pain and/or shortness of breath   Able to exercise without chest pain and/or shortness of breath  Anesthesia review: Hx: HTN,Right BBB.  Patient denies shortness of breath, fever, cough and chest pain at PAT appointment   Patient verbalized understanding of instructions that were given to them at the PAT appointment. Patient was also instructed that they will need to review over the PAT instructions again at home before surgery.

## 2023-06-22 NOTE — Progress Notes (Signed)
RN spoke with patient prior to upcoming robotic prostatectomy scheduled for 7/8.  Patient has no questions or concerns at this time.  Will follow up post surgery to assess any additional needs.

## 2023-06-22 NOTE — H&P (Signed)
Office Visit Report     06/01/2023   --------------------------------------------------------------------------------   Gregg [name]  MRN: [id number]  DOB: 1956-03-22, 67 year old Male  SSN:    PRIMARY CARE:  Gregg Stabs, NP  PRIMARY CARE FAX:  [phone]  REFERRING:  Gregg Fat, MD  PROVIDER:  Berniece [name], M.D.  TREATING:  Gregg [name], [place]  LOCATION:  [facility] [phone]     --------------------------------------------------------------------------------   CC/HPI: Pt presents today for pre-operative history and physical exam in anticipation of robotic assisted lap radical prostatectomy with bilateral pelvic lymph node dissection by Gregg [name] on 06/28/23. He is doing well and is without complaint.   Pt denies F/C, HA, CP, SOB, N/V, diarrhea/constipation, back pain, flank pain, hematuria, and dysuria.     HX:   CC: Prostate Cancer   Physician requesting consult: Gregg [name]  PCP: Gregg Stabs, NP  Location of consult: [facility]   Gregg [name] is a 67 year old gentleman with a past medical history of asthma, GERD, hyperlipidemia, hypertension, depression, hypothyroidism, and urolithiasis. He was noted to have an indurated area along the right mid lateral prostate and an elevated PSA of 5.69. This prompted an MRI of the prostate on 03/15/23 that indicated a 1.5 cm PI-RADS 5 lesion of the right mid/apex peripheral zone. An MR/US fusion biopsy on 03/31/23 confirmed Gleason 4+4=8 adenocarcinoma of the prostate with all 3 targeted biopsies positive and 5 out of the 12 systematic biopsies positive.   Family history: His brother was diagnosed with prostate cancer at age 7.   Imaging studies:  MRI (03/15/23) - No EPE, SVI, LAD, or bone lesions.  PSMA PET scan (04/26/23) - Negative for metastatic disease. Uptake in the right prostate consistent with MR findings.   PMH: He has a  history of asthma, GERD, hyperlipidemia, hypertension, depression, hypothyroidism, and urolithiasis  PSH: Laparoscopic appendectomy, laparoscopic cholecystectomy, and laparoscopic hernia repair by Dr. Michaell [name].   TNM stage: cT2a N0 M0  PSA: 5.69  Gleason score: 4+4=8 (GG4)  Biopsy (03/31/23): 8/15 cores positive  Left: L base (10%, 3+3=6)  Right: R mid (40%, 3+4=7), R lateral mid (20%, 3+4=7), R base (10%, 3+3=6), R lateral base (30%, 4+4=8)  ROI: 3/3 cores (50%, 4+4=8 and 50%, 4+4=8 and 30%, 4+3=7)  Prostate volume: 65.0 cc   Nomogram  OC disease: 33%  EPE: 65%  SVI: 15%  LNI: 19%  PFS (5 year, 10 year): 41%, 27%   Urinary function: IPSS is 10.  Erectile function: SHIM score is 16. His partner has undergone a radical cystectomy by Gregg [name] in the past and does have erectile dysfunction. This is therefore a lower priority for him.     ALLERGIES: None    MEDICATIONS: Levofloxacin 750 mg tablet 1 tablet PO once Take on morning of the procedure  Levothyroxine Sodium 125 mcg capsule  Sildenafil Citrate 20 mg tablet 1-5 tablets daily as needed  Atorvastatin Calcium 40 mg tablet  Breo Ellipta 100 mcg-25 mcg/dose blister, with inhalation device  Co Q-10  Levocetirizine Dihydrochloride 5 mg tablet  Losartan-Hydrochlorothiazide 50 mg-12.5 mg tablet  Zoloft 25 mg tablet     GU PSH: ESWL, Left - 2020 Prostate Needle Biopsy - 03/31/2023       PSH Notes: Thyroid removed 09/2022   NON-GU PSH: Appendectomy (laparoscopic) - about 2022 Cholecystectomy (open) - about 12/21/2021 Colonoscopy - about 09/20/2021 Colonoscopy W/fb Remove - about 12/21/2021  Inguinal hernia repair (laparoscopic) - about 2022 Surgical Pathology, Gross And Microscopic Examination For Prostate Needle - 03/31/2023     GU PMH: Prostate Cancer - 05/18/2023, - 04/08/2023 Prostate nodule w/ LUTS - 04/08/2023, - 02/09/2023, - 01/27/2022 Elevated PSA - 03/31/2023, - 02/09/2023, - 07/27/2022 ED due to arterial insufficiency -  07/27/2022 Nocturia - 07/27/2022, - 01/27/2022 Other dorsalgia - 2020 Renal calculus - 2020, - 2020      PMH Notes: asthma, GERD, hyperlipidemia, hypertension, depression, hypothyroidism, and urolithiasis     NON-GU PMH: Hyperthyroidism - 11/20/2021    FAMILY HISTORY: Atrial Fibrillation - Brother, Sister Breast Cancer - Mother Death - Sister, Father, Mother heart - Father   SOCIAL HISTORY: Marital Status: Radiation protection practitioner Preferred Language: English; Ethnicity: Not Hispanic Or Latino; Race: White Current Smoking Status: Patient does not smoke anymore. Has not smoked since 03/22/2017. Smoked for 30 years. Smoked 1 pack per day.   Tobacco Use Assessment Completed: Used Tobacco in last 30 days? Does not use smokeless tobacco. Does not drink anymore.  Does not use drugs. Drinks 1 caffeinated drink per day. Has not had a blood transfusion. Patient's occupation is/was [employer] Retired.    REVIEW OF SYSTEMS:    GU Review Male:   Patient denies frequent urination, hard to postpone urination, burning/ pain with urination, get up at night to urinate, leakage of urine, stream starts and stops, trouble starting your stream, have to strain to urinate , erection problems, and penile pain.  Gastrointestinal (Upper):   Patient denies indigestion/ heartburn, nausea, and vomiting.  Gastrointestinal (Lower):   Patient denies diarrhea and constipation.  Constitutional:   Patient denies fever, night sweats, weight loss, and fatigue.  Skin:   Patient denies skin rash/ lesion and itching.  Eyes:   Patient denies blurred vision and double vision.  Ears/ Nose/ Throat:   Patient denies sore throat and sinus problems.  Hematologic/Lymphatic:   Patient denies swollen glands and easy bruising.  Cardiovascular:   Patient denies leg swelling and chest pains.  Respiratory:   Patient denies cough and shortness of breath.  Endocrine:   Patient denies excessive thirst.  Musculoskeletal:   Patient denies back pain and  joint pain.  Neurological:   Patient denies headaches and dizziness.  Psychologic:   Patient denies depression and anxiety.   VITAL SIGNS:      06/01/2023 02:15 PM  Weight 182 lb / 82.55 kg  Height 69 in / 175.26 cm  BP 106/64 mmHg  Pulse 94 /min  Temperature 97.7 F / 36.5 C  BMI 26.9 kg/m   MULTI-SYSTEM PHYSICAL EXAMINATION:    Constitutional: Well-nourished. No physical deformities. Normally developed. Good grooming.  Neck: Neck symmetrical, not swollen. Normal tracheal position.  Respiratory: Normal breath sounds. No labored breathing, no use of accessory muscles.   Cardiovascular: Regular rate and rhythm. No murmur, no gallop.  Lymphatic: No enlargement of neck, axillae, groin.  Skin: No paleness, no jaundice, no cyanosis. No lesion, no ulcer, no rash.  Neurologic / Psychiatric: Oriented to time, oriented to place, oriented to person. No depression, no anxiety, no agitation.  Gastrointestinal: No mass, no tenderness, no rigidity, non obese abdomen.  Eyes: Normal conjunctivae. Normal eyelids.  Ears, Nose, Mouth, and Throat: Left ear no scars, no lesions, no masses. Right ear no scars, no lesions, no masses. Nose no scars, no lesions, no masses. Normal hearing. Normal lips.  Musculoskeletal: Normal gait and station of head and neck.     Complexity of Data:  Records Review:   Previous Patient Records  Urine Test Review:   Urinalysis   06/01/23  Urinalysis  Urine Appearance Clear   Urine Color Yellow   Urine Glucose Neg mg/dL  Urine Bilirubin Neg mg/dL  Urine Ketones Neg mg/dL  Urine Specific Gravity 1.010   Urine Blood Neg ery/uL  Urine pH 6.0   Urine Protein Neg mg/dL  Urine Urobilinogen 0.2 mg/dL  Urine Nitrites Neg   Urine Leukocyte Esterase Neg leu/uL   PROCEDURES:          Urinalysis - 81003 Dipstick Dipstick Cont'd  Color: Yellow Bilirubin: Neg mg/dL  Appearance: Clear Ketones: Neg mg/dL  Specific Gravity: 4.098 Blood: Neg ery/uL  pH: 6.0 Protein: Neg mg/dL   Glucose: Neg mg/dL Urobilinogen: 0.2 mg/dL    Nitrites: Neg    Leukocyte Esterase: Neg leu/uL    ASSESSMENT:      ICD-10 Details  1 GU:   Prostate Cancer - C61    PLAN:            Medications Stop Meds: Albuterol Sulfate  Discontinue: 06/01/2023  - Reason: The medication cycle was completed.  METHIMAZOLE PO Daily  Start: 11/20/2021  Discontinue: 06/01/2023  - Reason: The medication cycle was completed.  Multiple Vitamin  Discontinue: 06/01/2023  - Reason: The medication was too expensive.  Protonix  Discontinue: 06/01/2023  - Reason: The medication cycle was completed.            Schedule Return Visit/Planned Activity: Keep Scheduled Appointment - Schedule Surgery          Document Letter(s):  Created for Patient: Clinical Summary         Notes:   There are no changes in the patients history or physical exam since last evaluation by Gregg [name]. Pt is scheduled to undergo RALP with BPLND on 06/28/23.   All pt's questions were answered to the best of my ability.          Next Appointment:      Next Appointment: 06/28/2023 11:15 AM    Appointment Type: Surgery     Location: [facility] [phone]    Provider: Heloise Purpura, M.D.    Reason for Visit: WL/OBS RA LAP RAD PROSTATECTOMY LEV 3 AND BPLND WITH AMANDA      * Signed by Ulyses Amor, PA on 06/01/23 at 2:41 PM (EDT*

## 2023-06-28 ENCOUNTER — Encounter (HOSPITAL_COMMUNITY): Payer: Self-pay | Admitting: Urology

## 2023-06-28 ENCOUNTER — Ambulatory Visit (HOSPITAL_BASED_OUTPATIENT_CLINIC_OR_DEPARTMENT_OTHER): Payer: HMO | Admitting: Anesthesiology

## 2023-06-28 ENCOUNTER — Other Ambulatory Visit: Payer: Self-pay

## 2023-06-28 ENCOUNTER — Ambulatory Visit (HOSPITAL_COMMUNITY): Payer: HMO | Admitting: Physician Assistant

## 2023-06-28 ENCOUNTER — Encounter (HOSPITAL_COMMUNITY)
Admission: RE | Disposition: A | Discharge status: Home / Self Care | Payer: Self-pay | Source: Home / Self Care | Attending: Urology

## 2023-06-28 ENCOUNTER — Observation Stay (HOSPITAL_COMMUNITY)
Admission: RE | Admit: 2023-06-28 | Discharge: 2023-06-29 | Disposition: A | Discharge status: Home / Self Care | Payer: HMO | Attending: Urology | Admitting: Urology

## 2023-06-28 DIAGNOSIS — Z87891 Personal history of nicotine dependence: Secondary | ICD-10-CM | POA: Diagnosis not present

## 2023-06-28 DIAGNOSIS — I10 Essential (primary) hypertension: Secondary | ICD-10-CM | POA: Diagnosis not present

## 2023-06-28 DIAGNOSIS — J45909 Unspecified asthma, uncomplicated: Secondary | ICD-10-CM | POA: Insufficient documentation

## 2023-06-28 DIAGNOSIS — C61 Malignant neoplasm of prostate: Principal | ICD-10-CM | POA: Insufficient documentation

## 2023-06-28 DIAGNOSIS — F418 Other specified anxiety disorders: Secondary | ICD-10-CM | POA: Diagnosis not present

## 2023-06-28 DIAGNOSIS — Z79899 Other long term (current) drug therapy: Secondary | ICD-10-CM | POA: Insufficient documentation

## 2023-06-28 DIAGNOSIS — E785 Hyperlipidemia, unspecified: Secondary | ICD-10-CM | POA: Diagnosis not present

## 2023-06-28 DIAGNOSIS — E039 Hypothyroidism, unspecified: Secondary | ICD-10-CM | POA: Diagnosis not present

## 2023-06-28 DIAGNOSIS — J4530 Mild persistent asthma, uncomplicated: Secondary | ICD-10-CM | POA: Diagnosis not present

## 2023-06-28 HISTORY — PX: LYMPHADENECTOMY: SHX5960

## 2023-06-28 HISTORY — PX: ROBOT ASSISTED LAPAROSCOPIC RADICAL PROSTATECTOMY: SHX5141

## 2023-06-28 LAB — HEMOGLOBIN AND HEMATOCRIT, BLOOD
HCT: 41.8 % (ref 39.0–52.0)
Hemoglobin: 13.9 g/dL (ref 13.0–17.0)

## 2023-06-28 LAB — ABO/RH: ABO/RH(D): A NEG

## 2023-06-28 SURGERY — XI ROBOTIC ASSISTED LAPAROSCOPIC RADICAL PROSTATECTOMY LEVEL 3
Anesthesia: General

## 2023-06-28 MED ORDER — LEVOTHYROXINE SODIUM 112 MCG PO TABS
112.0000 ug | ORAL_TABLET | Freq: Every day | ORAL | Status: DC
  Administered 2023-06-29: 112 ug via ORAL
  Filled 2023-06-28: qty 1

## 2023-06-28 MED ORDER — LOSARTAN POTASSIUM-HCTZ 50-12.5 MG PO TABS: 1.0000 | ORAL_TABLET | Freq: Every day | ORAL | Status: DC

## 2023-06-28 MED ORDER — SERTRALINE HCL 25 MG PO TABS
25.0000 mg | ORAL_TABLET | Freq: Every day | ORAL | Status: DC
  Administered 2023-06-29: 25 mg via ORAL
  Filled 2023-06-28: qty 1

## 2023-06-28 MED ORDER — ACETAMINOPHEN 500 MG PO TABS
1000.0000 mg | ORAL_TABLET | Freq: Once | ORAL | Status: AC
  Administered 2023-06-28: 1000 mg via ORAL
  Filled 2023-06-28: qty 2

## 2023-06-28 MED ORDER — ACETAMINOPHEN 325 MG PO TABS: 650.0000 mg | ORAL_TABLET | ORAL | Status: DC | PRN

## 2023-06-28 MED ORDER — ALBUTEROL SULFATE (2.5 MG/3ML) 0.083% IN NEBU: 2.5000 mg | INHALATION_SOLUTION | Freq: Four times a day (QID) | RESPIRATORY_TRACT | Status: DC | PRN

## 2023-06-28 MED ORDER — ONDANSETRON HCL 4 MG/2ML IJ SOLN: 4.0000 mg | INTRAMUSCULAR | Status: DC | PRN

## 2023-06-28 MED ORDER — KETAMINE HCL 10 MG/ML IJ SOLN
INTRAMUSCULAR | Status: DC | PRN
  Administered 2023-06-28: 20 mg via INTRAVENOUS
  Administered 2023-06-28 (×3): 10 mg via INTRAVENOUS

## 2023-06-28 MED ORDER — PANTOPRAZOLE SODIUM 40 MG PO TBEC: 40.0000 mg | DELAYED_RELEASE_TABLET | Freq: Every day | ORAL | Status: DC

## 2023-06-28 MED ORDER — LIDOCAINE 2% (20 MG/ML) 5 ML SYRINGE
INTRAMUSCULAR | Status: DC | PRN
  Administered 2023-06-28: 100 mg via INTRAVENOUS

## 2023-06-28 MED ORDER — KETOROLAC TROMETHAMINE 15 MG/ML IJ SOLN
15.0000 mg | Freq: Four times a day (QID) | INTRAMUSCULAR | Status: DC
  Administered 2023-06-28 – 2023-06-29 (×4): 15 mg via INTRAVENOUS
  Filled 2023-06-28 (×3): qty 1

## 2023-06-28 MED ORDER — FENTANYL CITRATE (PF) 100 MCG/2ML IJ SOLN
INTRAMUSCULAR | Status: AC
  Filled 2023-06-28: qty 2

## 2023-06-28 MED ORDER — FENTANYL CITRATE PF 50 MCG/ML IJ SOSY
PREFILLED_SYRINGE | INTRAMUSCULAR | Status: AC
  Administered 2023-06-28: 50 ug via INTRAVENOUS
  Filled 2023-06-28: qty 2

## 2023-06-28 MED ORDER — DOCUSATE SODIUM 100 MG PO CAPS
100.0000 mg | ORAL_CAPSULE | Freq: Two times a day (BID) | ORAL | Status: DC
  Administered 2023-06-28 – 2023-06-29 (×2): 100 mg via ORAL
  Filled 2023-06-28 (×2): qty 1

## 2023-06-28 MED ORDER — DOCUSATE SODIUM 100 MG PO CAPS: 100.0000 mg | ORAL_CAPSULE | Freq: Two times a day (BID) | ORAL | Status: DC

## 2023-06-28 MED ORDER — BUPIVACAINE-EPINEPHRINE 0.25% -1:200000 IJ SOLN
INTRAMUSCULAR | Status: AC
  Filled 2023-06-28: qty 1

## 2023-06-28 MED ORDER — ATORVASTATIN CALCIUM 40 MG PO TABS
40.0000 mg | ORAL_TABLET | Freq: Every day | ORAL | Status: DC
  Administered 2023-06-28: 40 mg via ORAL
  Filled 2023-06-28: qty 1

## 2023-06-28 MED ORDER — LACTATED RINGERS IV SOLN: INTRAVENOUS | Status: DC | PRN

## 2023-06-28 MED ORDER — HYOSCYAMINE SULFATE 0.125 MG SL SUBL
0.1250 mg | SUBLINGUAL_TABLET | Freq: Four times a day (QID) | SUBLINGUAL | Status: DC | PRN
  Administered 2023-06-28: 0.125 mg via SUBLINGUAL

## 2023-06-28 MED ORDER — CEFAZOLIN SODIUM-DEXTROSE 2-4 GM/100ML-% IV SOLN
2.0000 g | INTRAVENOUS | Status: AC
  Administered 2023-06-28: 2 g via INTRAVENOUS
  Filled 2023-06-28: qty 100

## 2023-06-28 MED ORDER — STERILE WATER FOR IRRIGATION IR SOLN
Status: DC | PRN
  Administered 2023-06-28: 1000 mL

## 2023-06-28 MED ORDER — LACTATED RINGERS IV SOLN: INTRAVENOUS | Status: DC

## 2023-06-28 MED ORDER — FENTANYL CITRATE PF 50 MCG/ML IJ SOSY
PREFILLED_SYRINGE | INTRAMUSCULAR | Status: AC
  Administered 2023-06-28: 50 ug via INTRAVENOUS
  Filled 2023-06-28: qty 1

## 2023-06-28 MED ORDER — PHENYLEPHRINE HCL-NACL 20-0.9 MG/250ML-% IV SOLN
INTRAVENOUS | Status: DC | PRN
  Administered 2023-06-28: 15 ug/min via INTRAVENOUS

## 2023-06-28 MED ORDER — DIPHENHYDRAMINE HCL 12.5 MG/5ML PO ELIX: 12.5000 mg | ORAL_SOLUTION | Freq: Four times a day (QID) | ORAL | Status: DC | PRN

## 2023-06-28 MED ORDER — HYOSCYAMINE SULFATE 0.125 MG SL SUBL
SUBLINGUAL_TABLET | SUBLINGUAL | Status: AC
  Filled 2023-06-28: qty 1

## 2023-06-28 MED ORDER — PROPOFOL 10 MG/ML IV BOLUS
INTRAVENOUS | Status: DC | PRN
  Administered 2023-06-28: 170 mg via INTRAVENOUS

## 2023-06-28 MED ORDER — ZOLPIDEM TARTRATE 5 MG PO TABS: 5.0000 mg | ORAL_TABLET | Freq: Every evening | ORAL | Status: DC | PRN

## 2023-06-28 MED ORDER — HYDROCHLOROTHIAZIDE 12.5 MG PO TABS
12.5000 mg | ORAL_TABLET | Freq: Every day | ORAL | Status: DC
  Administered 2023-06-29: 12.5 mg via ORAL
  Filled 2023-06-28: qty 1

## 2023-06-28 MED ORDER — MIDAZOLAM HCL 2 MG/2ML IJ SOLN
INTRAMUSCULAR | Status: AC
  Filled 2023-06-28: qty 2

## 2023-06-28 MED ORDER — FENTANYL CITRATE (PF) 100 MCG/2ML IJ SOLN
INTRAMUSCULAR | Status: DC | PRN
  Administered 2023-06-28: 25 ug via INTRAVENOUS
  Administered 2023-06-28: 100 ug via INTRAVENOUS

## 2023-06-28 MED ORDER — FENTANYL CITRATE PF 50 MCG/ML IJ SOSY
25.0000 ug | PREFILLED_SYRINGE | INTRAMUSCULAR | Status: DC | PRN
  Administered 2023-06-28: 50 ug via INTRAVENOUS

## 2023-06-28 MED ORDER — SODIUM CHLORIDE 0.9 % IV BOLUS
1000.0000 mL | Freq: Once | INTRAVENOUS | Status: AC
  Administered 2023-06-28: 1000 mL via INTRAVENOUS

## 2023-06-28 MED ORDER — CHLORHEXIDINE GLUCONATE 0.12 % MT SOLN
15.0000 mL | Freq: Once | OROMUCOSAL | Status: AC
  Administered 2023-06-28: 15 mL via OROMUCOSAL

## 2023-06-28 MED ORDER — LORATADINE 10 MG PO TABS
10.0000 mg | ORAL_TABLET | Freq: Every evening | ORAL | Status: DC
  Administered 2023-06-28: 10 mg via ORAL
  Filled 2023-06-28: qty 1

## 2023-06-28 MED ORDER — ONDANSETRON HCL 4 MG/2ML IJ SOLN
INTRAMUSCULAR | Status: AC
  Filled 2023-06-28: qty 2

## 2023-06-28 MED ORDER — FLUTICASONE FUROATE-VILANTEROL 100-25 MCG/ACT IN AEPB
1.0000 | INHALATION_SPRAY | Freq: Every day | RESPIRATORY_TRACT | Status: DC
  Administered 2023-06-29: 1 via RESPIRATORY_TRACT
  Filled 2023-06-28: qty 28

## 2023-06-28 MED ORDER — DEXAMETHASONE SODIUM PHOSPHATE 10 MG/ML IJ SOLN
INTRAMUSCULAR | Status: AC
  Filled 2023-06-28: qty 1

## 2023-06-28 MED ORDER — MORPHINE SULFATE (PF) 2 MG/ML IV SOLN
2.0000 mg | INTRAVENOUS | Status: DC | PRN
  Administered 2023-06-28 (×2): 2 mg via INTRAVENOUS
  Filled 2023-06-28 (×2): qty 1

## 2023-06-28 MED ORDER — FLEET ENEMA 7-19 GM/118ML RE ENEM
1.0000 | ENEMA | Freq: Once | RECTAL | Status: DC
  Filled 2023-06-28: qty 1

## 2023-06-28 MED ORDER — DIPHENHYDRAMINE HCL 50 MG/ML IJ SOLN: 12.5000 mg | Freq: Four times a day (QID) | INTRAMUSCULAR | Status: DC | PRN

## 2023-06-28 MED ORDER — PROPOFOL 10 MG/ML IV BOLUS
INTRAVENOUS | Status: AC
  Filled 2023-06-28: qty 20

## 2023-06-28 MED ORDER — ONDANSETRON HCL 4 MG/2ML IJ SOLN: 4.0000 mg | Freq: Once | INTRAMUSCULAR | Status: DC | PRN

## 2023-06-28 MED ORDER — HEPARIN SODIUM (PORCINE) 1000 UNIT/ML IJ SOLN
INTRAMUSCULAR | Status: AC
  Filled 2023-06-28: qty 1

## 2023-06-28 MED ORDER — DEXAMETHASONE SODIUM PHOSPHATE 10 MG/ML IJ SOLN
INTRAMUSCULAR | Status: DC | PRN
  Administered 2023-06-28: 8 mg via INTRAVENOUS

## 2023-06-28 MED ORDER — ONDANSETRON HCL 4 MG/2ML IJ SOLN
INTRAMUSCULAR | Status: DC | PRN
  Administered 2023-06-28: 4 mg via INTRAVENOUS

## 2023-06-28 MED ORDER — ROCURONIUM BROMIDE 10 MG/ML (PF) SYRINGE
PREFILLED_SYRINGE | INTRAVENOUS | Status: DC | PRN
  Administered 2023-06-28: 70 mg via INTRAVENOUS
  Administered 2023-06-28: 30 mg via INTRAVENOUS

## 2023-06-28 MED ORDER — HEMOSTATIC AGENTS (NO CHARGE) OPTIME
TOPICAL | Status: DC | PRN
  Administered 2023-06-28: 1 via TOPICAL

## 2023-06-28 MED ORDER — SUGAMMADEX SODIUM 200 MG/2ML IV SOLN
INTRAVENOUS | Status: DC | PRN
  Administered 2023-06-28: 200 mg via INTRAVENOUS

## 2023-06-28 MED ORDER — CEFAZOLIN SODIUM-DEXTROSE 1-4 GM/50ML-% IV SOLN
1.0000 g | Freq: Three times a day (TID) | INTRAVENOUS | Status: AC
  Administered 2023-06-28 – 2023-06-29 (×2): 1 g via INTRAVENOUS
  Filled 2023-06-28 (×2): qty 50

## 2023-06-28 MED ORDER — KETOROLAC TROMETHAMINE 15 MG/ML IJ SOLN
INTRAMUSCULAR | Status: AC
  Filled 2023-06-28: qty 1

## 2023-06-28 MED ORDER — TRIPLE ANTIBIOTIC 3.5-400-5000 EX OINT: 1.0000 | TOPICAL_OINTMENT | Freq: Three times a day (TID) | CUTANEOUS | Status: DC | PRN

## 2023-06-28 MED ORDER — MIDAZOLAM HCL 5 MG/5ML IJ SOLN
INTRAMUSCULAR | Status: DC | PRN
  Administered 2023-06-28: 2 mg via INTRAVENOUS

## 2023-06-28 MED ORDER — ORAL CARE MOUTH RINSE: 15.0000 mL | Freq: Once | OROMUCOSAL | Status: AC

## 2023-06-28 MED ORDER — LOSARTAN POTASSIUM 50 MG PO TABS
50.0000 mg | ORAL_TABLET | Freq: Every day | ORAL | Status: DC
  Administered 2023-06-29: 50 mg via ORAL
  Filled 2023-06-28: qty 1

## 2023-06-28 MED ORDER — FLUTICASONE PROPIONATE 50 MCG/ACT NA SUSP: 2.0000 | Freq: Every day | NASAL | Status: DC | PRN

## 2023-06-28 MED ORDER — KCL IN DEXTROSE-NACL 20-5-0.45 MEQ/L-%-% IV SOLN
INTRAVENOUS | Status: DC
  Filled 2023-06-28 (×3): qty 1000

## 2023-06-28 MED ORDER — LACTATED RINGERS IV SOLN
INTRAVENOUS | Status: DC | PRN
  Administered 2023-06-28: 1000 mL

## 2023-06-28 MED ORDER — MAGNESIUM CITRATE PO SOLN
1.0000 | Freq: Once | ORAL | Status: DC
  Filled 2023-06-28: qty 296

## 2023-06-28 MED ORDER — BUPIVACAINE-EPINEPHRINE 0.25% -1:200000 IJ SOLN
INTRAMUSCULAR | Status: DC | PRN
  Administered 2023-06-28: 30 mL

## 2023-06-28 MED ORDER — SULFAMETHOXAZOLE-TRIMETHOPRIM 800-160 MG PO TABS: 1.0000 | ORAL_TABLET | Freq: Two times a day (BID) | ORAL | 0 refills | Status: DC

## 2023-06-28 MED ORDER — TRAMADOL HCL 50 MG PO TABS: 50.0000 mg | ORAL_TABLET | Freq: Four times a day (QID) | ORAL | 0 refills | Status: DC | PRN

## 2023-06-28 MED ORDER — ROCURONIUM BROMIDE 10 MG/ML (PF) SYRINGE
PREFILLED_SYRINGE | INTRAVENOUS | Status: AC
  Filled 2023-06-28: qty 10

## 2023-06-28 MED ORDER — SODIUM CHLORIDE 0.9 % IR SOLN
Status: DC | PRN
  Administered 2023-06-28: 1000 mL via INTRAVESICAL

## 2023-06-28 MED ORDER — AMISULPRIDE (ANTIEMETIC) 5 MG/2ML IV SOLN: 10.0000 mg | Freq: Once | INTRAVENOUS | Status: DC | PRN

## 2023-06-28 SURGICAL SUPPLY — 70 items
ADH SKN CLS APL DERMABOND .7 (GAUZE/BANDAGES/DRESSINGS) ×2
APL PRP STRL LF DISP 70% ISPRP (MISCELLANEOUS) ×2
APL SWBSTK 6 STRL LF DISP (MISCELLANEOUS) ×2
APPLICATOR COTTON TIP 6 STRL (MISCELLANEOUS) ×2 IMPLANT
APPLICATOR COTTON TIP 6IN STRL (MISCELLANEOUS) ×2
BAG COUNTER SPONGE SURGICOUNT (BAG) IMPLANT
BAG SPNG CNTER NS LX DISP (BAG)
CATH FOLEY 2WAY SLVR 18FR 30CC (CATHETERS) ×2 IMPLANT
CATH ROBINSON RED A/P 16FR (CATHETERS) ×2 IMPLANT
CATH ROBINSON RED A/P 8FR (CATHETERS) ×2 IMPLANT
CATH TIEMANN FOLEY 18FR 5CC (CATHETERS) ×2 IMPLANT
CHLORAPREP W/TINT 26 (MISCELLANEOUS) ×2 IMPLANT
CLIP LIGATING HEM O LOK PURPLE (MISCELLANEOUS) ×2 IMPLANT
CNTNR URN SCR LID CUP LEK RST (MISCELLANEOUS) IMPLANT
CONT SPEC 4OZ STRL OR WHT (MISCELLANEOUS) ×2
COVER SURGICAL LIGHT HANDLE (MISCELLANEOUS) ×2 IMPLANT
COVER TIP SHEARS 8 DVNC (MISCELLANEOUS) ×2 IMPLANT
CUTTER ECHEON FLEX ENDO 45 340 (ENDOMECHANICALS) ×2 IMPLANT
DERMABOND ADVANCED .7 DNX12 (GAUZE/BANDAGES/DRESSINGS) ×2 IMPLANT
DRAPE ARM DVNC X/XI (DISPOSABLE) ×8 IMPLANT
DRAPE COLUMN DVNC XI (DISPOSABLE) ×2 IMPLANT
DRAPE SURG IRRIG POUCH 19X23 (DRAPES) ×2 IMPLANT
DRIVER NDL LRG 8 DVNC XI (INSTRUMENTS) ×4 IMPLANT
DRIVER NDLE LRG 8 DVNC XI (INSTRUMENTS) ×4 IMPLANT
DRSG TEGADERM 4X4.75 (GAUZE/BANDAGES/DRESSINGS) ×2 IMPLANT
ELECT PENCIL ROCKER SW 15FT (MISCELLANEOUS) ×2 IMPLANT
ELECT REM PT RETURN 15FT ADLT (MISCELLANEOUS) ×2 IMPLANT
FORCEPS BPLR LNG DVNC XI (INSTRUMENTS) ×2 IMPLANT
FORCEPS PROGRASP DVNC XI (FORCEP) ×2 IMPLANT
GAUZE 4X4 16PLY ~~LOC~~+RFID DBL (SPONGE) ×2 IMPLANT
GAUZE SPONGE 4X4 12PLY STRL (GAUZE/BANDAGES/DRESSINGS) ×2 IMPLANT
GLOVE BIO SURGEON STRL SZ 6.5 (GLOVE) ×2 IMPLANT
GLOVE SURG LX STRL 7.5 STRW (GLOVE) ×4 IMPLANT
GOWN SRG XL LVL 4 BRTHBL STRL (GOWNS) ×2 IMPLANT
GOWN STRL NON-REIN XL LVL4 (GOWNS) ×2
GOWN STRL REUS W/ TWL XL LVL3 (GOWN DISPOSABLE) ×4 IMPLANT
GOWN STRL REUS W/TWL XL LVL3 (GOWN DISPOSABLE) ×4
HEMOSTAT SURGICEL 2X3 (HEMOSTASIS) IMPLANT
HOLDER FOLEY CATH W/STRAP (MISCELLANEOUS) ×2 IMPLANT
IRRIG SUCT STRYKERFLOW 2 WTIP (MISCELLANEOUS) ×2
IRRIGATION SUCT STRKRFLW 2 WTP (MISCELLANEOUS) ×2 IMPLANT
IV LACTATED RINGERS 1000ML (IV SOLUTION) ×2 IMPLANT
KIT TURNOVER KIT A (KITS) IMPLANT
NDL SAFETY ECLIP 18X1.5 (MISCELLANEOUS) ×2 IMPLANT
PACK ROBOT UROLOGY CUSTOM (CUSTOM PROCEDURE TRAY) ×2 IMPLANT
PLUG CATH AND CAP STRL 200 (CATHETERS) ×2 IMPLANT
RELOAD STAPLE 45 4.1 GRN THCK (STAPLE) ×2 IMPLANT
SCISSORS LAP 5X35 DISP (ENDOMECHANICALS) IMPLANT
SCISSORS MNPLR CVD DVNC XI (INSTRUMENTS) ×2 IMPLANT
SEAL UNIV 5-12 XI (MISCELLANEOUS) ×8 IMPLANT
SET CYSTO W/LG BORE CLAMP LF (SET/KITS/TRAYS/PACK) IMPLANT
SET TUBE SMOKE EVAC HIGH FLOW (TUBING) ×2 IMPLANT
SOL ELECTROSURG ANTI STICK (MISCELLANEOUS) ×2
SOL PREP POV-IOD 4OZ 10% (MISCELLANEOUS) ×2 IMPLANT
SOLUTION ELECTROSURG ANTI STCK (MISCELLANEOUS) ×2 IMPLANT
SPIKE FLUID TRANSFER (MISCELLANEOUS) ×2 IMPLANT
STAPLE RELOAD 45 GRN (STAPLE) ×2 IMPLANT
STAPLE RELOAD 45MM GREEN (STAPLE) ×2
SUT ETHILON 3 0 PS 1 (SUTURE) ×2 IMPLANT
SUT MNCRL 3 0 RB1 (SUTURE) ×2 IMPLANT
SUT MNCRL 3 0 VIOLET RB1 (SUTURE) ×2 IMPLANT
SUT MNCRL AB 4-0 PS2 18 (SUTURE) ×4 IMPLANT
SUT PDS PLUS AB 0 CT-2 (SUTURE) ×4 IMPLANT
SUT VIC AB 0 CT1 27 (SUTURE) ×4
SUT VIC AB 0 CT1 27XBRD ANTBC (SUTURE) ×4 IMPLANT
SUT VIC AB 2-0 SH 27 (SUTURE) ×4
SUT VIC AB 2-0 SH 27X BRD (SUTURE) ×2 IMPLANT
SYR 27GX1/2 1ML LL SAFETY (SYRINGE) ×2 IMPLANT
TOWEL OR NON WOVEN STRL DISP B (DISPOSABLE) ×2 IMPLANT
WATER STERILE IRR 1000ML POUR (IV SOLUTION) ×2 IMPLANT

## 2023-06-28 NOTE — Progress Notes (Signed)
Patient ID: Charan Nuzzi, male   DOB: [birth year], 67 y.o.   MRN: [id number]  Post-op note  Subjective: The patient is doing well.  C/O bladder spasms.  Objective: Vital signs in last 24 hours: Temp:  [98.2 F (36.8 C)] 98.2 F (36.8 C) (07/08 1418) Pulse Rate:  [85-93] 93 (07/08 1515) Resp:  [11-16] 11 (07/08 1515) BP: (127-146)/(64-80) 128/64 (07/08 1515) SpO2:  [89 %-98 %] 89 % (07/08 1515) Weight:  [81.8 kg] 81.8 kg (07/08 0928)  Intake/Output from previous day: No intake/output data recorded. Intake/Output this shift: Total I/O In: 2141.2 [I.V.:2041.2; IV Piggyback:100] Out: 155 [Drains:30; Blood:125]  Physical Exam:  General: Alert and oriented. Abdomen: Soft, Nondistended. Incisions: Clean and dry.  Lab Results: No results for input(s): "HGB", "HCT" in the last 72 hours.  Assessment/Plan: POD#0   1) Continue to monitor, ambulate, IS   Moody Bruins. MD   LOS: 0 days   Crecencio Mc 06/28/2023, 3:27 PM

## 2023-06-28 NOTE — Interval H&P Note (Signed)
History and Physical Interval Note:  06/28/2023 10:17 AM  Gregg [name]  has presented today for surgery, with the diagnosis of PROSTATE CANCER.  The various methods of treatment have been discussed with the patient and family. After consideration of risks, benefits and other options for treatment, the patient has consented to  Procedure(s) with comments: XI ROBOTIC ASSISTED LAPAROSCOPIC RADICAL PROSTATECTOMY LEVEL 3 (N/A) - 210 MINUTES NEEDED FOR CASE BILATERAL PELVIC LYMPHADENECTOMY (Bilateral) as a surgical intervention.  The patient's history has been reviewed, patient examined, no change in status, stable for surgery.  I have reviewed the patient's chart and labs.  Questions were answered to the patient's satisfaction.     [employer]

## 2023-06-28 NOTE — Anesthesia Postprocedure Evaluation (Signed)
Anesthesia Post Note  Patient: Gregg [name]  Procedure(s) Performed: XI ROBOTIC ASSISTED LAPAROSCOPIC RADICAL PROSTATECTOMY LEVEL 3 BILATERAL PELVIC LYMPHADENECTOMY (Bilateral)     Patient location during evaluation: PACU Anesthesia Type: General Level of consciousness: awake and alert Pain management: pain level controlled Vital Signs Assessment: post-procedure vital signs reviewed and stable Respiratory status: spontaneous breathing, nonlabored ventilation, respiratory function stable and patient connected to nasal cannula oxygen Cardiovascular status: blood pressure returned to baseline and stable Postop Assessment: no apparent nausea or vomiting Anesthetic complications: no   No notable events documented.  Last Vitals:  Vitals:   06/28/23 1600 06/28/23 1620  BP:  127/81  Pulse: 81 89  Resp: 11 15  Temp:  36.4 C  SpO2: 91% 91%    Last Pain:  Vitals:   06/28/23 1620  TempSrc: Oral  PainSc:                  Collene Schlichter

## 2023-06-28 NOTE — Op Note (Signed)
Preoperative diagnosis: Clinically localized adenocarcinoma of the prostate (clinical stage T2a N0 M0)  Postoperative diagnosis: Clinically localized adenocarcinoma of the prostate (clinical stage T2a N0 0)  Procedure:  Robotic assisted laparoscopic radical prostatectomy (left nerve sparing) Right robotic assisted laparoscopic pelvic lymphadenectomy  Surgeon: Moody Bruins. M.D.  Assistant(s): Harrie Foreman, PA-C  An assistant was required for this surgical procedure.  The duties of the assistant included but were not limited to suctioning, passing suture, camera manipulation, retraction. This procedure would not be able to be performed without an Geophysicist/field seismologist.   Resident: Dr. Jerald Kief   Anesthesia: General  Complications: None  EBL: 125 mL  IVF:  1700 mL crystalloid  Specimens: Prostate and seminal vesicles Right pelvic lymph nodes Left pelvic lymph nodes  Disposition of specimens: Pathology  Drains: 20 Fr coude catheter # 19 Blake pelvic drain  Indication: Gregg [name] is a 67 y.o. patient with clinically localized prostate cancer.  After a thorough review of the management options for treatment of prostate cancer, he elected to proceed with surgical therapy and the above procedure(s).  We have discussed the potential benefits and risks of the procedure, side effects of the proposed treatment, the likelihood of the patient achieving the goals of the procedure, and any potential problems that might occur during the procedure or recuperation. Informed consent has been obtained.  Description of procedure:  The patient was taken to the operating room and a general anesthetic was administered. He was given preoperative antibiotics, placed in the dorsal lithotomy position, and prepped and draped in the usual sterile fashion. Next a preoperative timeout was performed. A urethral catheter was placed into the bladder and a site was selected near the umbilicus  for placement of the camera port. This was placed using a standard open Hassan technique which allowed entry into the peritoneal cavity under direct vision and without difficulty. An 8 mm port was placed and a pneumoperitoneum established. The camera was then used to inspect the abdomen and there was no evidence of any intra-abdominal injuries or other abnormalities. The remaining abdominal ports were then placed. 8 mm robotic ports were placed in the right lower quadrant, left lower quadrant, and far left lateral abdominal wall. A 5 mm port was placed in the right upper quadrant and a 12 mm port was placed in the right lateral abdominal wall for laparoscopic assistance. All ports were placed under direct vision without difficulty. The surgical cart was then docked.   Utilizing the cautery scissors, the bladder was reflected posteriorly allowing entry into the space of Retzius and identification of the endopelvic fascia and prostate. He had undergone a prior bilateral laparoscopic inguinal hernia repair and there was noted to be an extensive piece of mesh extending across the entire pelvis and down over the pelvic side walls.  This resulted in significant adhesions between the abdominal wall and the bladder that resulted in tedious dissection to expose the endopelvic fascia. The periprostatic fat was then removed from the prostate allowing full exposure of the endopelvic fascia. The endopelvic fascia was then incised from the apex back to the base of the prostate bilaterally and the underlying levator muscle fibers were swept laterally off the prostate thereby isolating the dorsal venous complex. The dorsal vein was then stapled and divided with a 45 mm Flex Echelon stapler. Attention then turned to the bladder neck which was divided anteriorly thereby allowing entry into the bladder and exposure of the urethral catheter. The catheter balloon was deflated  and the catheter was brought into the operative field and  used to retract the prostate anteriorly. The posterior bladder neck was then examined and was divided allowing further dissection between the bladder and prostate posteriorly until the vasa deferentia and seminal vessels were identified. The vasa deferentia were isolated, divided, and lifted anteriorly. The seminal vesicles were dissected down to their tips with care to control the seminal vascular arterial blood supply. These structures were then lifted anteriorly and the space between Denonvillier's fascia and the anterior rectum was developed with a combination of sharp and blunt dissection. This isolated the vascular pedicles of the prostate.  The lateral prostatic fascia on the left side of the prostate was then sharply incised allowing release of the neurovascular bundle. The vascular pedicle of the prostate on the left side was then ligated with Weck clips between the prostate and neurovascular bundle and divided with sharp cold scissor dissection resulting in neurovascular bundle preservation. On the right side, a wide non nerve sparing dissection was performed with Weck clips used to ligate the vascular pedicle of the prostate. The neurovascular bundle on the left side was then separated off the apex of the prostate and urethra.  The urethra was then sharply transected allowing the prostate specimen to be disarticulated. The pelvis was copiously irrigated and hemostasis was ensured. There was no evidence for rectal injury.  Attention then turned to the right pelvic sidewall. The fibrofatty tissue between the external iliac vein, confluence of the iliac vessels, hypogastric artery, and Cooper's ligament was dissected free from the pelvic sidewall with care to preserve the obturator nerve. There was a piece of mesh that was overlying the lymphatic packet that required removal. Weck clips were used for lymphostasis and hemostasis. The left sidewall was then examined and there was mesh overlying the  lymphatic packet.  It was felt that the risk/benefit of doing a lymph node dissection on this side, particularly in the setting of a negative PSMA PET scan was not favorable and this side was omitted. The lymphatic packet was removed for permanent pathologic analysis.  Attention then turned to the urethral anastomosis. A 2-0 Vicryl slip knot was placed between Denonvillier's fascia, the posterior bladder neck, and the posterior urethra to reapproximate these structures. A double-armed 3-0 Monocryl suture was then used to perform a 360 running tension-free anastomosis between the bladder neck and urethra. A new urethral catheter was then placed into the bladder and irrigated. There were no blood clots within the bladder and the anastomosis appeared to be watertight. A #19 Blake drain was then brought through the left lateral 8 mm port site and positioned appropriately within the pelvis. It was secured to the skin with a nylon suture. The surgical cart was then undocked. The right lateral 12 mm port site was closed at the fascial level with a 0 Vicryl suture placed laparoscopically. All remaining ports were then removed under direct vision. The prostate specimen was removed intact within the Endopouch retrieval bag via the periumbilical camera port site. This fascial opening was closed with two running 0 PDS sutures. 0.25% Marcaine was then injected into all port sites and all incisions were reapproximated at the skin level with 4-0 Monocryl subcuticular sutures and Dermabond. The patient appeared to tolerate the procedure well and without complications. The patient was able to be extubated and transferred to the recovery unit in satisfactory condition.   Moody Bruins MD

## 2023-06-28 NOTE — Discharge Instructions (Signed)

## 2023-06-28 NOTE — Anesthesia Procedure Notes (Signed)
Procedure Name: Intubation Date/Time: 06/28/2023 11:13 AM  Performed by: Elisabeth Cara, CRNAPre-anesthesia Checklist: Patient identified, Emergency Drugs available, Suction available, Patient being monitored and Timeout performed Patient Re-evaluated:Patient Re-evaluated prior to induction Oxygen Delivery Method: Circle system utilized Preoxygenation: Pre-oxygenation with 100% oxygen Induction Type: IV induction Ventilation: Mask ventilation without difficulty Laryngoscope Size: Mac and 4 Grade View: Grade I Tube type: Oral Tube size: 7.5 mm Number of attempts: 1 Airway Equipment and Method: Stylet Placement Confirmation: ETT inserted through vocal cords under direct vision, positive ETCO2 and breath sounds checked- equal and bilateral Secured at: 23 cm Tube secured with: Tape Dental Injury: Teeth and Oropharynx as per pre-operative assessment

## 2023-06-28 NOTE — Transfer of Care (Signed)
Immediate Anesthesia Transfer of Care Note  Patient: Gregg [name]  Procedure(s) Performed: XI ROBOTIC ASSISTED LAPAROSCOPIC RADICAL PROSTATECTOMY LEVEL 3 BILATERAL PELVIC LYMPHADENECTOMY (Bilateral)  Patient Location: PACU  Anesthesia Type:General  Level of Consciousness: awake, alert , oriented, and patient cooperative  Airway & Oxygen Therapy: Patient Spontanous Breathing and Patient connected to face mask oxygen  Post-op Assessment: Report given to RN, Post -op Vital signs reviewed and stable, and Patient moving all extremities  Post vital signs: Reviewed and stable  Last Vitals:  Vitals Value Taken Time  BP 140/78 06/28/23 1417  Temp    Pulse 89 06/28/23 1420  Resp 21 06/28/23 1420  SpO2 97 % 06/28/23 1420  Vitals shown include unvalidated device data.  Last Pain:  Vitals:   06/28/23 0928  TempSrc:   PainSc: 0-No pain         Complications: No notable events documented.

## 2023-06-28 NOTE — Anesthesia Preprocedure Evaluation (Addendum)
Anesthesia Evaluation  Patient identified by MRN, date of birth, ID band Patient awake    Reviewed: Allergy & Precautions, NPO status , Patient's Chart, lab work & pertinent test results  Airway Mallampati: III  TM Distance: >3 FB Neck ROM: Full    Dental  (+) Dental Advisory Given, Missing   Pulmonary asthma , former smoker   Pulmonary exam normal breath sounds clear to auscultation       Cardiovascular hypertension, Pt. on medications Normal cardiovascular exam+ dysrhythmias  Rhythm:Regular Rate:Normal     Neuro/Psych  PSYCHIATRIC DISORDERS Anxiety Depression    negative neurological ROS     GI/Hepatic Neg liver ROS,GERD  Medicated,,  Endo/Other  negative endocrine ROS    Renal/GU Renal InsufficiencyRenal disease   PROSTATE CANCER    Musculoskeletal  (+) Arthritis ,    Abdominal   Peds  Hematology negative hematology ROS (+)   Anesthesia Other Findings Day of surgery medications reviewed with the patient.  Reproductive/Obstetrics                             Anesthesia Physical Anesthesia Plan  ASA: 3  Anesthesia Plan: General   Post-op Pain Management: Tylenol PO (pre-op)* and Ketamine IV*   Induction: Intravenous  PONV Risk Score and Plan: 3 and Midazolam, Dexamethasone and Ondansetron  Airway Management Planned: Oral ETT  Additional Equipment:   Intra-op Plan:   Post-operative Plan: Extubation in OR  Informed Consent: I have reviewed the patients History and Physical, chart, labs and discussed the procedure including the risks, benefits and alternatives for the proposed anesthesia with the patient or authorized representative who has indicated his/her understanding and acceptance.     Dental advisory given  Plan Discussed with: CRNA  Anesthesia Plan Comments: (2nd IV after induction )       Anesthesia Quick Evaluation

## 2023-06-29 ENCOUNTER — Encounter (HOSPITAL_COMMUNITY): Payer: Self-pay | Admitting: Urology

## 2023-06-29 DIAGNOSIS — C61 Malignant neoplasm of prostate: Secondary | ICD-10-CM | POA: Diagnosis not present

## 2023-06-29 LAB — HEMOGLOBIN AND HEMATOCRIT, BLOOD
HCT: 39.1 % (ref 39.0–52.0)
Hemoglobin: 13.1 g/dL (ref 13.0–17.0)

## 2023-06-29 MED ORDER — BISACODYL 10 MG RE SUPP
10.0000 mg | Freq: Once | RECTAL | Status: AC
  Administered 2023-06-29: 10 mg via RECTAL
  Filled 2023-06-29: qty 1

## 2023-06-29 MED ORDER — TRAMADOL HCL 50 MG PO TABS
50.0000 mg | ORAL_TABLET | Freq: Four times a day (QID) | ORAL | Status: DC | PRN
  Administered 2023-06-29: 50 mg via ORAL
  Filled 2023-06-29: qty 1

## 2023-06-29 MED ORDER — CHLORHEXIDINE GLUCONATE CLOTH 2 % EX PADS
6.0000 | MEDICATED_PAD | Freq: Every day | CUTANEOUS | Status: DC
  Administered 2023-06-29: 6 via TOPICAL

## 2023-06-29 NOTE — Progress Notes (Signed)
Pt discharged to home at this time. Prior to DC, IV was removed. Pt was given DC instructions regarding medications, appointments and condition. Pt verbalized understanding and stated no other concerns a this time. Pt stable at time of DC and left in personal vehicle driven by friend.

## 2023-06-29 NOTE — Discharge Summary (Signed)
  Date of admission: 06/28/2023  Date of discharge: 06/29/2023  Admission diagnosis: Prostate Cancer  Discharge diagnosis: Prostate Cancer  History and Physical: For full details, please see admission history and physical. Briefly, Gregg [name] is a 67 y.o. gentleman with localized prostate cancer.  After discussing management/treatment options, he elected to proceed with surgical treatment.  Hospital Course: Gregg [name] was taken to the operating room on 06/28/2023 and underwent a robotic assisted laparoscopic radical prostatectomy. He tolerated this procedure well and without complications. Postoperatively, he was able to be transferred to a regular hospital room following recovery from anesthesia.  He was able to begin ambulating the night of surgery. He remained hemodynamically stable overnight.  He had excellent urine output with appropriately minimal output from his pelvic drain and his pelvic drain was removed on POD #1.  He was transitioned to oral pain medication, tolerated a clear liquid diet, and had met all discharge criteria and was able to be discharged home later on POD#1.  Laboratory values:  Recent Labs    06/28/23 1728 06/29/23 0426  HGB 13.9 13.1  HCT 41.8 39.1    Disposition: Home  Discharge instruction: He was instructed to be ambulatory but to refrain from heavy lifting, strenuous activity, or driving. He was instructed on urethral catheter care.  Discharge medications:   Allergies as of 06/29/2023   No Known Allergies      Medication List     STOP taking these medications    CoQ10 100 MG Caps       TAKE these medications    albuterol 108 (90 Base) MCG/ACT inhaler Commonly known as: VENTOLIN HFA Inhale 2 puffs into the lungs every 6 (six) hours as needed for wheezing or shortness of breath.   atorvastatin 40 MG tablet Commonly known as: LIPITOR Take 1 tablet (40 mg total) by mouth daily.   Breo Ellipta 100-25 MCG/ACT Aepb Generic  drug: fluticasone furoate-vilanterol Inhale 1 puff into the lungs daily. What changed:  when to take this reasons to take this   docusate sodium 100 MG capsule Commonly known as: COLACE Take 1 capsule (100 mg total) by mouth 2 (two) times daily.   fluticasone 50 MCG/ACT nasal spray Commonly known as: FLONASE Place 2 sprays into both nostrils daily. What changed:  when to take this reasons to take this   levocetirizine 5 MG tablet Commonly known as: XYZAL Take 5 mg by mouth every evening.   levothyroxine 112 MCG tablet Commonly known as: SYNTHROID Take 1 tablet (112 mcg total) by mouth daily.   losartan-hydrochlorothiazide 50-12.5 MG tablet Commonly known as: HYZAAR TAKE 1 TABLET BY MOUTH EVERY DAY   pantoprazole 40 MG tablet Commonly known as: PROTONIX Take 1 tablet (40 mg total) by mouth daily before breakfast.   sertraline 25 MG tablet Commonly known as: ZOLOFT Take 1 tablet (25 mg total) by mouth daily.   sulfamethoxazole-trimethoprim 800-160 MG tablet Commonly known as: BACTRIM DS Take 1 tablet by mouth 2 (two) times daily. Start the day prior to foley removal appointment   traMADol 50 MG tablet Commonly known as: Ultram Take 1-2 tablets (50-100 mg total) by mouth every 6 (six) hours as needed for moderate pain or severe pain.        Followup: He will followup in 1 week for catheter removal and to discuss his surgical pathology results.

## 2023-06-29 NOTE — Progress Notes (Signed)
Patient ID: Gregg [name], male   DOB: [birth year], 67 y.o.   MRN: [id number]  1 Day Post-Op Subjective: The patient is doing well.  No nausea or vomiting. Pain is adequately controlled.  Objective: Vital signs in last 24 hours: Temp:  [97.6 F (36.4 C)-99.1 F (37.3 C)] 98.4 F (36.9 C) (07/09 0423) Pulse Rate:  [63-93] 63 (07/09 0423) Resp:  [10-18] 14 (07/09 0423) BP: (107-146)/(63-81) 112/66 (07/09 0423) SpO2:  [89 %-98 %] 92 % (07/09 0727) Weight:  [81.8 kg] 81.8 kg (07/08 0928)  Intake/Output from previous day: 07/08 0701 - 07/09 0700 In: 5366.7 [P.O.:120; I.V.:4046.7; IV Piggyback:1200] Out: 1755 [Urine:1525; Drains:105; Blood:125] Intake/Output this shift: No intake/output data recorded.  Physical Exam:  General: Alert and oriented. CV: RRR Lungs: Clear bilaterally. GI: Soft, Nondistended. Incisions: Clean, dry, and intact Urine: Clear Extremities: Nontender, no erythema, no edema.  Lab Results: Recent Labs    06/28/23 1728 06/29/23 0426  HGB 13.9 13.1  HCT 41.8 39.1      Assessment/Plan: POD# 1 s/p robotic prostatectomy.  1) SL IVF 2) Ambulate, Incentive spirometry 3) Transition to oral pain medication 4) Dulcolax suppository 5) D/C pelvic drain 6) Plan for likely discharge later today   Gregg [name]. MD   LOS: 0 days   Gregg [name] 06/29/2023, 7:37 AM

## 2023-06-30 ENCOUNTER — Telehealth: Payer: Self-pay

## 2023-06-30 NOTE — Transitions of Care (Post Inpatient/ED Visit) (Signed)
06/30/2023  Name: Gregg [name] MRN: [id number] DOB: 30-Jan-1956  Today's TOC FU Call Status: Today's TOC FU Call Status:: Successful TOC FU Call Competed TOC FU Call Complete Date: 06/30/23  Transition Care Management Follow-up Telephone Call Date of Discharge: 06/29/23 Discharge Facility: [facility]) Type of Discharge: Inpatient Admission Primary Inpatient Discharge Diagnosis:: prostate cancer How have you been since you were released from the hospital?: Better (He stated he is feeling better every day.  He said he makes sure to get up and walk every hour.) Any questions or concerns?: No  Items Reviewed: Did you receive and understand the discharge instructions provided?: Yes Medications obtained,verified, and reconciled?: Partial Review Completed Reason for Partial Mediation Review: He said he has all of his medications and did not have any questions about the med regime Any new allergies since your discharge?: No Dietary orders reviewed?: Yes Type of Diet Ordered:: heart healthy Do you have support at home?: Yes Name of Support/Comfort Primary Source: He said he has help at home.  Medications Reviewed Today: Medications Reviewed Today     Reviewed by Sonny Dandy, RN (Registered Nurse) on 06/28/23 at 0920  Med List Status: Complete   Medication Order Taking? Sig Documenting Provider Last Dose Status Informant  albuterol (VENTOLIN HFA) 108 (90 Base) MCG/ACT inhaler [id number] No Inhale 2 puffs into the lungs every 6 (six) hours as needed for wheezing or shortness of breath. Tomma Lightning, MD More than a month Active Self  atorvastatin (LIPITOR) 40 MG tablet [id number] Yes Take 1 tablet (40 mg total) by mouth daily. Rema Fendt, NP 06/27/2023 Active Self  BREO ELLIPTA 100-25 MCG/ACT AEPB [id number] Yes Inhale 1 puff into the lungs daily.  Patient taking differently: Inhale 1 puff into the lungs daily as needed (shortness of breath).   Tomma Lightning, MD  06/27/2023 Active Self  Coenzyme Q10 (COQ10) 100 MG CAPS [id number] Yes Take 100 mg by mouth in the morning. [provider] Past Month Active Self  fluticasone (FLONASE) 50 MCG/ACT nasal spray [id number] No Place 2 sprays into both nostrils daily.  Patient taking differently: Place 2 sprays into both nostrils daily as needed for allergies or rhinitis.   Freddy Finner, NP Unknown Active Self  levocetirizine (XYZAL) 5 MG tablet [id number] Yes Take 5 mg by mouth every evening. [provider] 06/27/2023 Active Self  levothyroxine (SYNTHROID) 112 MCG tablet [id number] Yes Take 1 tablet (112 mcg total) by mouth daily. Shamleffer, Konrad Dolores, MD 06/28/2023 Active Self  losartan-hydrochlorothiazide (HYZAAR) 50-12.5 MG tablet [id number] Yes TAKE 1 TABLET BY MOUTH EVERY DAY Rema Fendt, NP 06/27/2023 Active   pantoprazole (PROTONIX) 40 MG tablet [id number] No Take 1 tablet (40 mg total) by mouth daily before breakfast.  Patient not taking: Reported on 06/08/2023   Shellia Cleverly, DO Not Taking Active Self  sertraline (ZOLOFT) 25 MG tablet [id number] Yes Take 1 tablet (25 mg total) by mouth daily. Rema Fendt, NP 06/28/2023 Active Self            Home Care and Equipment/Supplies: Were Home Health Services Ordered?: No Any new equipment or medical supplies ordered?: No  Functional Questionnaire: Do you need assistance with bathing/showering or dressing?: No Do you need assistance with meal preparation?: No Do you need assistance with eating?: No Do you have difficulty maintaining continence: Yes (He was discharged with a foley catheter and he said it is awkward trying to get around with it, He reported that it has  been draining clear urine today.) Do you need assistance with getting out of bed/getting out of a chair/moving?: No Do you have difficulty managing or taking your medications?: No  Follow up appointments reviewed: PCP Follow-up appointment confirmed?: Yes Date of  PCP follow-up appointment?: 10/26/23 Follow-up Provider: Ricky Stabs, NP Specialist Hospital Follow-up appointment confirmed?: Yes Date of Specialist follow-up appointment?: 07/06/23 Follow-Up Specialty Provider:: urology Do you need transportation to your follow-up appointment?: No Do you understand care options if your condition(s) worsen?: Yes-patient verbalized understanding    SIGNATURE Robyne Peers, RN

## 2023-07-02 LAB — SURGICAL PATHOLOGY

## 2023-07-16 NOTE — Progress Notes (Signed)
RN left message for follow up since recent robotic prostatectomy.

## 2023-07-20 DIAGNOSIS — H18413 Arcus senilis, bilateral: Secondary | ICD-10-CM | POA: Diagnosis not present

## 2023-07-20 DIAGNOSIS — H2511 Age-related nuclear cataract, right eye: Secondary | ICD-10-CM | POA: Diagnosis not present

## 2023-07-20 DIAGNOSIS — H25013 Cortical age-related cataract, bilateral: Secondary | ICD-10-CM | POA: Diagnosis not present

## 2023-07-20 DIAGNOSIS — H25043 Posterior subcapsular polar age-related cataract, bilateral: Secondary | ICD-10-CM | POA: Diagnosis not present

## 2023-07-20 DIAGNOSIS — H2513 Age-related nuclear cataract, bilateral: Secondary | ICD-10-CM | POA: Diagnosis not present

## 2023-07-28 ENCOUNTER — Other Ambulatory Visit: Payer: Self-pay | Admitting: Pharmacist

## 2023-07-28 DIAGNOSIS — M6281 Muscle weakness (generalized): Secondary | ICD-10-CM | POA: Diagnosis not present

## 2023-07-28 DIAGNOSIS — M62838 Other muscle spasm: Secondary | ICD-10-CM | POA: Diagnosis not present

## 2023-07-28 DIAGNOSIS — N393 Stress incontinence (female) (male): Secondary | ICD-10-CM | POA: Diagnosis not present

## 2023-07-28 DIAGNOSIS — E785 Hyperlipidemia, unspecified: Secondary | ICD-10-CM

## 2023-07-28 MED ORDER — ATORVASTATIN CALCIUM 40 MG PO TABS
40.0000 mg | ORAL_TABLET | Freq: Every day | ORAL | 1 refills | Status: DC
Start: 2023-07-28 — End: 2023-10-26

## 2023-07-28 NOTE — Progress Notes (Signed)
Pharmacy Quality Measure Review  This patient is appearing on a report for being at risk of failing the adherence measure for cholesterol (statin) medications this calendar year.   Medication: atorvastatin Last fill date: 04/20/2023 for 90 day supply  Sent in for additional refills for 90-day supplies. MyChart message and VM left with patient informing him of this.   Butch Penny, PharmD, Patsy Baltimore, CPP Clinical Pharmacist [facility] [phone]

## 2023-08-09 DIAGNOSIS — N393 Stress incontinence (female) (male): Secondary | ICD-10-CM | POA: Diagnosis not present

## 2023-08-09 DIAGNOSIS — M62838 Other muscle spasm: Secondary | ICD-10-CM | POA: Diagnosis not present

## 2023-08-09 DIAGNOSIS — M6281 Muscle weakness (generalized): Secondary | ICD-10-CM | POA: Diagnosis not present

## 2023-08-19 NOTE — Progress Notes (Addendum)
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  Patient has had his post op appointment at [facility], and is currently continuing with physical therapy. No additional needs at this time.

## 2023-09-11 ENCOUNTER — Other Ambulatory Visit: Payer: Self-pay | Admitting: Family

## 2023-09-11 DIAGNOSIS — F419 Anxiety disorder, unspecified: Secondary | ICD-10-CM

## 2023-09-11 DIAGNOSIS — F32A Depression, unspecified: Secondary | ICD-10-CM

## 2023-09-14 NOTE — Telephone Encounter (Signed)
Complete

## 2023-09-20 ENCOUNTER — Ambulatory Visit
Admission: RE | Admit: 2023-09-20 | Discharge: 2023-09-20 | Disposition: A | Discharge status: Home / Self Care | Payer: HMO | Source: Ambulatory Visit | Attending: Family | Admitting: Family

## 2023-09-20 DIAGNOSIS — Z122 Encounter for screening for malignant neoplasm of respiratory organs: Secondary | ICD-10-CM

## 2023-09-20 DIAGNOSIS — Z87891 Personal history of nicotine dependence: Secondary | ICD-10-CM

## 2023-09-21 DIAGNOSIS — C61 Malignant neoplasm of prostate: Secondary | ICD-10-CM | POA: Diagnosis not present

## 2023-09-24 ENCOUNTER — Encounter: Payer: Self-pay | Admitting: Pulmonary Disease

## 2023-09-27 DIAGNOSIS — H2511 Age-related nuclear cataract, right eye: Secondary | ICD-10-CM | POA: Diagnosis not present

## 2023-09-28 DIAGNOSIS — H2512 Age-related nuclear cataract, left eye: Secondary | ICD-10-CM | POA: Diagnosis not present

## 2023-09-30 DIAGNOSIS — N5201 Erectile dysfunction due to arterial insufficiency: Secondary | ICD-10-CM | POA: Diagnosis not present

## 2023-09-30 DIAGNOSIS — N393 Stress incontinence (female) (male): Secondary | ICD-10-CM | POA: Diagnosis not present

## 2023-09-30 DIAGNOSIS — C61 Malignant neoplasm of prostate: Secondary | ICD-10-CM | POA: Diagnosis not present

## 2023-10-01 ENCOUNTER — Telehealth: Payer: Self-pay | Admitting: Family

## 2023-10-01 ENCOUNTER — Other Ambulatory Visit: Payer: Self-pay | Admitting: Acute Care

## 2023-10-01 DIAGNOSIS — Z122 Encounter for screening for malignant neoplasm of respiratory organs: Secondary | ICD-10-CM

## 2023-10-01 DIAGNOSIS — Z87891 Personal history of nicotine dependence: Secondary | ICD-10-CM

## 2023-10-05 ENCOUNTER — Encounter: Payer: Self-pay | Admitting: Internal Medicine

## 2023-10-05 ENCOUNTER — Ambulatory Visit (INDEPENDENT_AMBULATORY_CARE_PROVIDER_SITE_OTHER): Payer: HMO | Admitting: Internal Medicine

## 2023-10-05 VITALS — BP 134/72 | HR 81 | Ht 69.0 in | Wt 186.0 lb

## 2023-10-05 DIAGNOSIS — E890 Postprocedural hypothyroidism: Secondary | ICD-10-CM

## 2023-10-05 DIAGNOSIS — R42 Dizziness and giddiness: Secondary | ICD-10-CM | POA: Diagnosis not present

## 2023-10-05 LAB — CBC
HCT: 46.3 % (ref 39.0–52.0)
Hemoglobin: 15.5 g/dL (ref 13.0–17.0)
MCHC: 33.4 g/dL (ref 30.0–36.0)
MCV: 92.2 fL (ref 78.0–100.0)
Platelets: 253 10*3/uL (ref 150.0–400.0)
RBC: 5.02 Mil/uL (ref 4.22–5.81)
RDW: 13.7 % (ref 11.5–15.5)
WBC: 6.9 10*3/uL (ref 4.0–10.5)

## 2023-10-05 LAB — T4, FREE: Free T4: 1 ng/dL (ref 0.60–1.60)

## 2023-10-05 LAB — TSH: TSH: 1.27 u[IU]/mL (ref 0.35–5.50)

## 2023-10-05 LAB — COMPREHENSIVE METABOLIC PANEL
ALT: 26 U/L (ref 0–53)
AST: 20 U/L (ref 0–37)
Albumin: 4.5 g/dL (ref 3.5–5.2)
Alkaline Phosphatase: 87 U/L (ref 39–117)
BUN: 20 mg/dL (ref 6–23)
CO2: 31 meq/L (ref 19–32)
Calcium: 9.9 mg/dL (ref 8.4–10.5)
Chloride: 100 meq/L (ref 96–112)
Creatinine, Ser: 1.13 mg/dL (ref 0.40–1.50)
GFR: 68.19 mL/min (ref >60.00)
Glucose, Bld: 98 mg/dL (ref 70–99)
Potassium: 3.5 meq/L (ref 3.5–5.1)
Sodium: 141 meq/L (ref 135–145)
Total Bilirubin: 0.6 mg/dL (ref 0.2–1.2)
Total Protein: 7.4 g/dL (ref 6.0–8.3)

## 2023-10-05 NOTE — Progress Notes (Unsigned)
Name: Gregg [name]  MRN/ DOB: [id number], 12-07-1956    Age/ Sex: 67 y.o., male     PCP: Rema Fendt, NP   Reason for Endocrinology Evaluation: Hyperthyroidism     Initial Endocrinology Clinic Visit: 12/01/2021    PATIENT IDENTIFIER: [name] is a 67 y.o., male with a past medical history of MNG. He has followed with [facility] [place] [facility] since 12/01/2021 for consultative assistance with management of his hyperthyroidism.   HISTORICAL SUMMARY: The patient was first diagnosed with  hyperthyroidism in 2022.   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   He is s/p laparoscopic radical prostatectomy 06/2023 secondary to prostate cancer  Weight continues to fluctuate  Denies local neck swelling  Has had three episodes over the past 3 weeks, has noted dizziness with movement,denies frequency but has noted incontinent since the sx  No palpitations  He stays hydrated  Denies constipation or diarrhea  Denies tremors  No  Biotin   Levothyroxine 112 mcg   HISTORY:  Past Medical History:  Past Medical History:  Diagnosis Date   Anxiety    Arthritis    Asthma    Bilateral inguinal hernia 06/02/2021   Cancer (HCC) 04/05/2023   Prostrate CA early   Chronic kidney disease    GERD (gastroesophageal reflux disease)    Hemorrhoids, internal    History of kidney stones 2020   Hyperlipidemia    Hypertension    Right BBB/    Sinus complaint 06/02/2021   x 1 week per pt   Thyroid disease    Wears glasses 06/02/2021   Past Surgical History:  Past  Surgical History:  Procedure Laterality Date   CHOLECYSTECTOMY     COLONOSCOPY WITH PROPOFOL N/A 12/31/2021   Procedure: COLONOSCOPY WITH PROPOFOL;  Surgeon: Shellia Cleverly, DO;  Location: [facility];  Service: Gastroenterology;  Laterality: N/A;   ENDOSCOPIC MUCOSAL RESECTION N/A 12/31/2021   Procedure: ENDOSCOPIC MUCOSAL RESECTION;  Surgeon: Shellia Cleverly, DO;  Location: [facility];  Service: Gastroenterology;  Laterality: N/A;   EXTRACORPOREAL SHOCK WAVE LITHOTRIPSY Left 02/23/2019   Procedure: EXTRACORPOREAL SHOCK WAVE LITHOTRIPSY (ESWL);  Surgeon: Alfredo Martinez, MD;  Location: [facility];  Service: Urology;  Laterality: Left;   HEMOSTASIS CLIP PLACEMENT  12/31/2021   Procedure: HEMOSTASIS CLIP PLACEMENT;  Surgeon: Shellia Cleverly, DO;  Location: [facility];  Service: Gastroenterology;;   INGUINAL HERNIA REPAIR Bilateral 06/06/2021   Procedure: LAPAROSCOPIC BILATERAL INGUINAL HERNIA REPAIRS WITH MESH;  Surgeon: Karie Soda, MD;  [facility];  Service: General;  Laterality: Bilateral;  TAP BLOCK   LAPAROSCOPIC APPENDECTOMY N/A 03/07/2021   Procedure: APPENDECTOMY LAPAROSCOPIC;  Surgeon: Karie Soda, MD;  Location: [facility];  Service: General;  Laterality: N/A;   LYMPHADENECTOMY Bilateral 06/28/2023   Procedure: BILATERAL PELVIC LYMPHADENECTOMY;  Surgeon: Heloise Purpura, MD;  Location: [facility];  Service: Urology;  Laterality: Bilateral;   PROSTATE BIOPSY     ROBOT ASSISTED LAPAROSCOPIC RADICAL PROSTATECTOMY N/A 06/28/2023   Procedure: XI ROBOTIC ASSISTED LAPAROSCOPIC RADICAL PROSTATECTOMY LEVEL 3;  Surgeon: Heloise Purpura, MD;  Location: [facility];  Service: Urology;  Laterality: N/A;  210 MINUTES NEEDED FOR CASE   SUBMUCOSAL LIFTING INJECTION  12/31/2021   Procedure: SUBMUCOSAL LIFTING INJECTION;  Surgeon: Shellia Cleverly, DO;  Location: [facility];  Service: Gastroenterology;;   THYROIDECTOMY N/A 10/02/2022   Procedure: TOTAL THYROIDECTOMY;   Surgeon: Darnell Level, MD;  Location: [facility];  Service: General;  Laterality: N/A;   Social History:  reports that he quit smoking about 6 years ago. His smoking use included cigarettes. He started smoking about 46 years ago. He has a 20 pack-year smoking history. He has been exposed to tobacco smoke. He has never used smokeless tobacco. He reports current alcohol use. He reports that he does not use drugs. Family History:  Family History  Problem Relation Age of Onset   Thyroid disease Mother    Breast cancer Mother 56 - 53   Thyroid cancer Father 32   Thyroid disease Sister    Prostate cancer Brother 31   Bone cancer Brother 50       agent orange exposure   Prostate cancer Paternal Uncle 32   Kidney cancer Paternal Grandfather 90 - 68   Stomach cancer Neg Hx    Colon cancer Neg Hx    Esophageal cancer Neg Hx    Rectal cancer Neg Hx    Colon polyps Neg Hx      HOME MEDICATIONS: Allergies as of 10/05/2023   No Known Allergies      Medication List        Accurate as of October 05, 2023  7:52 AM. If you have any questions, ask your nurse or doctor.          STOP taking these medications    docusate sodium 100 MG capsule Commonly known as: COLACE Stopped by: Johnney Ou [place]   sulfamethoxazole-trimethoprim 800-160 MG tablet Commonly known as: BACTRIM DS Stopped by: Johnney Ou Henli Hey   traMADol 50 MG tablet Commonly known as: Ultram Stopped by: Johnney Ou Clayson Riling       TAKE these medications    albuterol 108 (90 Base) MCG/ACT inhaler Commonly known as: VENTOLIN HFA Inhale 2 puffs into the lungs every 6 (six) hours as needed for wheezing or shortness of breath.   atorvastatin 40 MG tablet Commonly known as: LIPITOR Take 1 tablet (40 mg total) by mouth daily.   Breo Ellipta 100-25 MCG/ACT Aepb Generic drug: fluticasone furoate-vilanterol Inhale 1 puff into the lungs daily. What changed:  when to take this reasons to take this   fluticasone  50 MCG/ACT nasal spray Commonly known as: FLONASE Place 2 sprays into both nostrils daily. What changed:  when to take this reasons to take this   gatifloxacin 0.5 % Soln Commonly known as: ZYMAXID Place 1 drop into the left eye 4 (four) times daily.   ketorolac 0.5 % ophthalmic solution Commonly known as: ACULAR Place 1 drop into the left eye 4 (four) times daily.   levocetirizine 5 MG tablet Commonly known as: XYZAL Take 5 mg by mouth every evening.   levothyroxine 112 MCG tablet Commonly known as: SYNTHROID Take 1 tablet (112 mcg total) by mouth daily.   losartan-hydrochlorothiazide 50-12.5 MG tablet Commonly known as: HYZAAR TAKE 1 TABLET BY MOUTH EVERY DAY   pantoprazole 40 MG tablet Commonly known as: PROTONIX Take 1 tablet (40 mg total) by mouth daily before breakfast.   prednisoLONE acetate 1 % ophthalmic suspension Commonly known as: PRED FORTE Place 1 drop into the right eye  4 (four) times daily.   sertraline 25 MG tablet Commonly known as: ZOLOFT TAKE 1 TABLET (25 MG TOTAL) BY MOUTH DAILY.          OBJECTIVE:   PHYSICAL EXAM: VS: BP 134/72 (BP Location: Left Arm, Patient Position: Sitting, Cuff Size: Small)   Pulse 81   Ht 5\' 9"  (1.753 m)   Wt 186 lb (84.4 kg)   SpO2 94%   BMI 27.47 kg/m    EXAM: General: Pt appears well and is in NAD  Neck: General: Supple without adenopathy. Thyroid: incision clean   Lungs: Clear with good BS bilat with no rales, rhonchi, or wheezes  Heart: Auscultation: RRR.  Extremities:  BL LE: No pretibial edema normal ROM and strength.  Mental Status: Judgment, insight: Intact Orientation: Oriented to time, place, and person Mood and affect: No depression, anxiety, or agitation     DATA REVIEWED:  Latest Reference Range & Units 03/30/23 10:47  TSH 0.35 - 5.50 uIU/mL 0.39     Thyroid Pathology 10/02/2022   FINAL MICROSCOPIC DIAGNOSIS:   A. THYROID, TOTAL THYROIDECTOMY:  - ADENOMATOID NODULAR  HYPERPLASIA (SEE COMMENT).  - NEGATIVE FOR MALIGNANCY.   ASSESSMENT / PLAN / RECOMMENDATIONS:   Postoperative Hypothyroidism  -Patient is clinically euthyroid -No local neck symptoms -TSH is trending down, will reduce the dose as below   Medication   levothyroxine 112 mcg daily  Follow-up in 6 months   Signed electronically by: Lyndle Herrlich, MD  [facility]  [facility] [address] [place] [place], [place] [address] Phone: [phone] FAX: [phone]      CC: Rema Fendt, NP [address] [place] [address] Phone: [phone]  Fax: [phone]   Return to Endocrinology clinic as below: Future Appointments  Date Time Provider Department Center  10/26/2023  8:20 AM Rema Fendt, NP [facility] None  10/26/2023  8:40 AM Rema Fendt, NP [facility] None

## 2023-10-05 NOTE — Patient Instructions (Signed)

## 2023-10-06 MED ORDER — LEVOTHYROXINE SODIUM 112 MCG PO TABS: 112.0000 ug | ORAL_TABLET | Freq: Every day | ORAL | 3 refills | Status: DC

## 2023-10-11 DIAGNOSIS — H2512 Age-related nuclear cataract, left eye: Secondary | ICD-10-CM | POA: Diagnosis not present

## 2023-10-11 DIAGNOSIS — H25012 Cortical age-related cataract, left eye: Secondary | ICD-10-CM | POA: Diagnosis not present

## 2023-10-11 DIAGNOSIS — H25042 Posterior subcapsular polar age-related cataract, left eye: Secondary | ICD-10-CM | POA: Diagnosis not present

## 2023-10-21 DIAGNOSIS — M62838 Other muscle spasm: Secondary | ICD-10-CM | POA: Diagnosis not present

## 2023-10-21 DIAGNOSIS — M6281 Muscle weakness (generalized): Secondary | ICD-10-CM | POA: Diagnosis not present

## 2023-10-21 DIAGNOSIS — N393 Stress incontinence (female) (male): Secondary | ICD-10-CM | POA: Diagnosis not present

## 2023-10-26 ENCOUNTER — Encounter: Payer: Self-pay | Admitting: Family

## 2023-10-26 ENCOUNTER — Ambulatory Visit (INDEPENDENT_AMBULATORY_CARE_PROVIDER_SITE_OTHER): Payer: HMO | Admitting: Family

## 2023-10-26 VITALS — BP 129/85 | HR 89 | Temp 98.5°F | Ht 69.0 in | Wt 185.6 lb

## 2023-10-26 VITALS — BP 129/85 | HR 89 | Temp 98.5°F | Ht 69.0 in | Wt 185.0 lb

## 2023-10-26 DIAGNOSIS — F419 Anxiety disorder, unspecified: Secondary | ICD-10-CM | POA: Diagnosis not present

## 2023-10-26 DIAGNOSIS — E785 Hyperlipidemia, unspecified: Secondary | ICD-10-CM

## 2023-10-26 DIAGNOSIS — I10 Essential (primary) hypertension: Secondary | ICD-10-CM

## 2023-10-26 DIAGNOSIS — Z131 Encounter for screening for diabetes mellitus: Secondary | ICD-10-CM

## 2023-10-26 DIAGNOSIS — Z0000 Encounter for general adult medical examination without abnormal findings: Secondary | ICD-10-CM

## 2023-10-26 DIAGNOSIS — F32A Depression, unspecified: Secondary | ICD-10-CM

## 2023-10-26 MED ORDER — SERTRALINE HCL 25 MG PO TABS: 25.0000 mg | ORAL_TABLET | Freq: Every day | ORAL | 0 refills | Status: DC

## 2023-10-26 MED ORDER — ATORVASTATIN CALCIUM 40 MG PO TABS: 40.0000 mg | ORAL_TABLET | Freq: Every day | ORAL | 0 refills | Status: DC

## 2023-10-26 MED ORDER — LOSARTAN POTASSIUM 50 MG PO TABS: 50.0000 mg | ORAL_TABLET | Freq: Every day | ORAL | 0 refills | Status: DC

## 2023-10-26 NOTE — Progress Notes (Signed)
Patient ID: Gregg [name], male    DOB: 11/11/1956  MRN: [id number]  CC: Chronic Conditions Follow-Up  Subjective: Gregg [name] is a 67 y.o. male who presents for chronic conditions follow-up.   His concerns today include:  - Reports he is established with Physical Therapy for urinary incontinence. He is also established with Oncology for management of history of prostate cancer. States both Physical Therapy and Oncology suggests that Hydrochlorothiazide may be contributing to urinary incontinence and to consider discontinuing. Patient reports he is otherwise doing well on Losartan-Hydrochlorothiazide. Home blood pressures at goal. He does not complain of red flag symptoms such as but not limited to chest pain, shortness of breath, worst headache of life, nausea/vomiting.  - Doing well on Atorvastatin, no issues/concerns.  - Doing well on Sertraline, no issues/concerns. He denies thoughts of self-harm, suicidal ideations, homicidal ideations.  Patient Active Problem List   Diagnosis Date Noted   Prostate cancer (HCC) 06/28/2023   Genetic testing 05/28/2023   Malignant neoplasm of prostate (HCC) 05/18/2023   Status post total thyroidectomy 10/14/2022   Toxic multinodular goiter 10/02/2022   Adenomatous polyp of transverse colon 09/10/2022   Diverticulosis of colon without hemorrhage 09/10/2022   Grade II internal hemorrhoids 09/10/2022   Mild persistent asthma 07/10/2022   Former smoker 07/10/2022   Enlarged thyroid gland 01/13/2022   Symptomatic cholelithiasis 01/08/2022   S/P laparoscopic cholecystectomy 01/08/2022   Multinodular goiter 12/01/2021   Multiple thyroid nodules 12/01/2021   Hyperlipidemia 09/11/2021   Essential hypertension 07/22/2021   Anxiety and depression 07/02/2021   History of appendicitis 07/01/2021   Acute phlegmonous appendicitis s/p lap appendectomy 03/07/2021 03/07/2021   Kidney stone 03/07/2021   Bilateral inguinal hernia (BIH) s/p lap repair with  mesh 06/06/2021 03/07/2021     Current Outpatient Medications on File Prior to Visit  Medication Sig Dispense Refill   albuterol (VENTOLIN HFA) 108 (90 Base) MCG/ACT inhaler Inhale 2 puffs into the lungs every 6 (six) hours as needed for wheezing or shortness of breath. 8 g 1   BREO ELLIPTA 100-25 MCG/ACT AEPB Inhale 1 puff into the lungs daily. (Patient taking differently: Inhale 1 puff into the lungs daily as needed (shortness of breath).) 3 each 3   fluticasone (FLONASE) 50 MCG/ACT nasal spray Place 2 sprays into both nostrils daily. (Patient taking differently: Place 2 sprays into both nostrils daily as needed for allergies or rhinitis.) 16 g 0   gatifloxacin (ZYMAXID) 0.5 % SOLN Place 1 drop into the left eye 4 (four) times daily.     ketorolac (ACULAR) 0.5 % ophthalmic solution Place 1 drop into the left eye 4 (four) times daily.     levocetirizine (XYZAL) 5 MG tablet Take 5 mg by mouth every evening.     levothyroxine (SYNTHROID) 112 MCG tablet Take 1 tablet (112 mcg total) by mouth daily. 90 tablet 3   prednisoLONE acetate (PRED FORTE) 1 % ophthalmic suspension Place 1 drop into the right eye 4 (four) times daily.     No current facility-administered medications on file prior to visit.    No Known Allergies  Social History   Socioeconomic History   Marital status: Single    Spouse name: [name]   Number of children: 0   Years of education: Not on file   Highest education level: Master's degree (e.g., MA, MS, MEng, MEd, MSW, MBA)  Occupational History   Occupation: [employer]  Tobacco Use   Smoking status: Former  Current packs/day: 0.00    Average packs/day: 0.5 packs/day for 40.0 years (20.0 ttl pk-yrs)    Types: Cigarettes    Start date: 03/29/1977    Quit date: 03/29/2017    Years since quitting: 6.5    Passive exposure: Past   Smokeless tobacco: Never  Vaping Use   Vaping status: Never Used  Substance and Sexual Activity   Alcohol use: Yes     Comment: rare   Drug use: Never   Sexual activity: Not on file  Other Topics Concern   Not on file  Social History Narrative   Not on file   Social Determinants of Health   Financial Resource Strain: Low Risk  (10/25/2023)   Overall Financial Resource Strain (CARDIA)    Difficulty of Paying Living Expenses: Not hard at all  Food Insecurity: No Food Insecurity (10/25/2023)   Hunger Vital Sign    Worried About Running Out of Food in the Last Year: Never true    Ran Out of Food in the Last Year: Never true  Transportation Needs: No Transportation Needs (10/25/2023)   PRAPARE - Administrator, Civil Service (Medical): No    Lack of Transportation (Non-Medical): No  Physical Activity: Insufficiently Active (10/25/2023)   Exercise Vital Sign    Days of Exercise per Week: 4 days    Minutes of Exercise per Session: 20 min  Stress: No Stress Concern Present (10/25/2023)   [employer] of Occupational Health - Occupational Stress Questionnaire    Feeling of Stress : Not at all  Social Connections: Moderately Integrated (10/25/2023)   Social Connection and Isolation Panel [NHANES]    Frequency of Communication with Friends and Family: More than three times a week    Frequency of Social Gatherings with Friends and Family: Three times a week    Attends Religious Services: More than 4 times per year    Active Member of Clubs or Organizations: Yes    Attends Banker Meetings: More than 4 times per year    Marital Status: Never married  Intimate Partner Violence: Not At Risk (06/28/2023)   Humiliation, Afraid, Rape, and Kick questionnaire    Fear of Current or Ex-Partner: No    Emotionally Abused: No    Physically Abused: No    Sexually Abused: No    Family History  Problem Relation Age of Onset   Thyroid disease Mother    Breast cancer Mother 76 - 27   Thyroid cancer Father 48   Thyroid disease Sister    Prostate cancer Brother 30   Bone cancer Brother 50        agent orange exposure   Prostate cancer Paternal Uncle 58   Kidney cancer Paternal Grandfather 25 - 58   Stomach cancer Neg Hx    Colon cancer Neg Hx    Esophageal cancer Neg Hx    Rectal cancer Neg Hx    Colon polyps Neg Hx     Past Surgical History:  Procedure Laterality Date   CHOLECYSTECTOMY     COLONOSCOPY WITH PROPOFOL N/A 12/31/2021   Procedure: COLONOSCOPY WITH PROPOFOL;  Surgeon: Shellia Cleverly, DO;  Location: [facility];  Service: Gastroenterology;  Laterality: N/A;   ENDOSCOPIC MUCOSAL RESECTION N/A 12/31/2021   Procedure: ENDOSCOPIC MUCOSAL RESECTION;  Surgeon: Shellia Cleverly, DO;  Location: [facility];  Service: Gastroenterology;  Laterality: N/A;   EXTRACORPOREAL SHOCK WAVE LITHOTRIPSY Left 02/23/2019   Procedure: EXTRACORPOREAL SHOCK WAVE LITHOTRIPSY (ESWL);  Surgeon: Sherron Monday,  Lorin Picket, MD;  Location: [facility];  Service: Urology;  Laterality: Left;   HEMOSTASIS CLIP PLACEMENT  12/31/2021   Procedure: HEMOSTASIS CLIP PLACEMENT;  Surgeon: Shellia Cleverly, DO;  Location: [facility];  Service: Gastroenterology;;   INGUINAL HERNIA REPAIR Bilateral 06/06/2021   Procedure: LAPAROSCOPIC BILATERAL INGUINAL HERNIA REPAIRS WITH MESH;  Surgeon: Karie Soda, MD;  Location: [facility];  Service: General;  Laterality: Bilateral;  TAP BLOCK   LAPAROSCOPIC APPENDECTOMY N/A 03/07/2021   Procedure: APPENDECTOMY LAPAROSCOPIC;  Surgeon: Karie Soda, MD;  Location: [facility];  Service: General;  Laterality: N/A;   LYMPHADENECTOMY Bilateral 06/28/2023   Procedure: BILATERAL PELVIC LYMPHADENECTOMY;  Surgeon: Heloise Purpura, MD;  Location: [facility];  Service: Urology;  Laterality: Bilateral;   PROSTATE BIOPSY     ROBOT ASSISTED LAPAROSCOPIC RADICAL PROSTATECTOMY N/A 06/28/2023   Procedure: XI ROBOTIC ASSISTED LAPAROSCOPIC RADICAL PROSTATECTOMY LEVEL 3;  Surgeon: Heloise Purpura, MD;  Location: [facility];  Service: Urology;  Laterality: N/A;  210 MINUTES NEEDED FOR CASE    SUBMUCOSAL LIFTING INJECTION  12/31/2021   Procedure: SUBMUCOSAL LIFTING INJECTION;  Surgeon: Shellia Cleverly, DO;  Location: [facility];  Service: Gastroenterology;;   THYROIDECTOMY N/A 10/02/2022   Procedure: TOTAL THYROIDECTOMY;  Surgeon: Darnell Level, MD;  Location: [facility];  Service: General;  Laterality: N/A;    ROS: Review of Systems Negative except as stated above  PHYSICAL EXAM: BP 129/85   Pulse 89   Temp 98.5 F (36.9 C) (Oral)   Ht 5\' 9"  (1.753 m)   Wt 185 lb 9.6 oz (84.2 kg)   SpO2 94%   BMI 27.41 kg/m   Physical Exam HENT:     Head: Normocephalic and atraumatic.     Nose: Nose normal.     Mouth/Throat:     Mouth: Mucous membranes are moist.     Pharynx: Oropharynx is clear.  Eyes:     Extraocular Movements: Extraocular movements intact.     Conjunctiva/sclera: Conjunctivae normal.     Pupils: Pupils are equal, round, and reactive to light.  Cardiovascular:     Rate and Rhythm: Normal rate and regular rhythm.     Pulses: Normal pulses.     Heart sounds: Normal heart sounds.  Pulmonary:     Effort: Pulmonary effort is normal.     Breath sounds: Normal breath sounds.  Musculoskeletal:        General: Normal range of motion.     Cervical back: Normal range of motion and neck supple.  Neurological:     General: No focal deficit present.     Mental Status: He is alert and oriented to person, place, and time.  Psychiatric:        Mood and Affect: Mood normal.        Behavior: Behavior normal.     ASSESSMENT AND PLAN: 1. Primary hypertension - Hydrochlorothiazide discontinued to see if urinary incontinence symptoms improve.  - Continue Losartan as prescribed.  - Counseled on blood pressure goal of less than 140/90, low-sodium, DASH diet, medication compliance, 150 minutes of moderate intensity exercise per week as tolerated. Discussed medication compliance, adverse effects. - Follow-up with primary provider in 2 weeks or sooner if needed.  - losartan  (COZAAR) 50 MG tablet; Take 1 tablet (50 mg total) by mouth daily.  Dispense: 90 tablet; Refill: 0  2. Hyperlipidemia, unspecified hyperlipidemia type - Continue Atorvastatin as prescribed. Counseled on medication adherence/adverse effects.  - Follow-up with primary provider as scheduled.  - atorvastatin (  LIPITOR) 40 MG tablet; Take 1 tablet (40 mg total) by mouth daily.  Dispense: 90 tablet; Refill: 0  3. Anxiety and depression - Patient denies thoughts of self-harm, suicidal ideations, homicidal ideations. - Continue Sertraline as prescribed. Counseled on medication adherence/adverse effects.  - Follow-up with primary provider in 3 months or sooner if needed.  - sertraline (ZOLOFT) 25 MG tablet; Take 1 tablet (25 mg total) by mouth daily.  Dispense: 90 tablet; Refill: 0   Patient was given the opportunity to ask questions.  Patient verbalized understanding of the plan and was able to repeat key elements of the plan. Patient was given clear instructions to go to Emergency Department or return to medical center if symptoms don't improve, worsen, or new problems develop.The patient verbalized understanding.  Requested Prescriptions   Signed Prescriptions Disp Refills   atorvastatin (LIPITOR) 40 MG tablet 90 tablet 0    Sig: Take 1 tablet (40 mg total) by mouth daily.   sertraline (ZOLOFT) 25 MG tablet 90 tablet 0    Sig: Take 1 tablet (25 mg total) by mouth daily.   losartan (COZAAR) 50 MG tablet 90 tablet 0    Sig: Take 1 tablet (50 mg total) by mouth daily.    Return in about 2 weeks (around 11/09/2023) for Follow-Up or next available blood pressure check.  Rema Fendt, NP

## 2023-10-26 NOTE — Progress Notes (Signed)
Welcome To Medicare  Gregg [name] is a 67 y.o. male who presents for a Welcome to Medicare exam.  Issues/Concerns For Discussion: - Reports he is established with Oncology for history of prostate cancer. States he was prescribed Cialis from Oncology and was told this could possibly lower blood pressure. I discussed with patient in detail to discuss with oncologist for advisement/next steps and to update them of the hypertension plan discussed in office on today. Patient verbalized understanding/agreement  Patient Active Problem List   Diagnosis Date Noted   Prostate cancer (HCC) 06/28/2023   Genetic testing 05/28/2023   Malignant neoplasm of prostate (HCC) 05/18/2023   Status post total thyroidectomy 10/14/2022   Toxic multinodular goiter 10/02/2022   Adenomatous polyp of transverse colon 09/10/2022   Diverticulosis of colon without hemorrhage 09/10/2022   Grade II internal hemorrhoids 09/10/2022   Mild persistent asthma 07/10/2022   Former smoker 07/10/2022   Enlarged thyroid gland 01/13/2022   Symptomatic cholelithiasis 01/08/2022   S/P laparoscopic cholecystectomy 01/08/2022   Multinodular goiter 12/01/2021   Multiple thyroid nodules 12/01/2021   Hyperlipidemia 09/11/2021   Essential hypertension 07/22/2021   Anxiety and depression 07/02/2021   History of appendicitis 07/01/2021   Acute phlegmonous appendicitis s/p lap appendectomy 03/07/2021 03/07/2021   Kidney stone 03/07/2021   Bilateral inguinal hernia (BIH) s/p lap repair with mesh 06/06/2021 03/07/2021    Health Maintenance Due  Topic Date Due   Medicare Annual Wellness (AWV)  Never done   Pneumonia Vaccine 58+ Years old (1 of 2 - PCV) Never done   Zoster Vaccines- Shingrix (1 of 2) Never done   DTaP/Tdap/Td (1 - Tdap) 07/20/2020   COVID-19 Vaccine (3 - Pfizer risk series) 07/28/2020   INFLUENZA VACCINE  07/22/2023   Health Habits Exercise: frequently Diet: well balanced   Depression Screen Over the past  two weeks have you:     Felt down or depressed? no     Had little interest or pleasure in doing things? no     Additional depression screening completed? no.     10/26/2023    8:06 AM 04/20/2023    8:17 AM 01/13/2023    8:26 AM 10/15/2022    8:20 AM 07/15/2022    8:15 AM  Depression screen PHQ 2/9  Decreased Interest 0 0 0 0 0  Down, Depressed, Hopeless 0 0 0 0 0  PHQ - 2 Score 0 0 0 0 0         Functional Ability Does the patient need help with: Myrtis Ser index)         Bathing: no         Dressing : no         Toileting: no         Transferring: no         Continence:  established with Physical Therapy for urinary incontinence         Feeding: no           Safety Screen Does the home have:         Rugs in the hallway: yes         Grab bars in the bathroom: yes         Handrails on the stairs:yes         Stairs in home: yes         Poor lightning: no  Hearing Evaluation Do you have trouble hearing the television when others do not? no Do you have  to strain to hear/understand conversations? no   Falls Risk: Does the patient need assistance with ambulation? no Does the patient have a history of a fall in the last 90 days? no Is the patient at risk for falls? no Was the patient's timed "Get Up and Go Test" unsteady or longer than 30 seconds? no.   Advanced Care Planning Patient has executed an Advance Directive: yes If no, patient was given the opportunity to execute an Advance Directive today? not applicable This patient has the ability to prepare an Advance Directive: not applicable    Cognitive Assessment Does the patient have evidence of cognitive impairment? No The patient does not have evidence of a change in mood/affect, appearance, speech, memory or motor skills.     PHYSICAL EXAM: Vitals:   10/26/23 0831  BP: 129/85  Pulse: 89  Temp: 98.5 F (36.9 C)  TempSrc: Oral  SpO2: 94%  Weight: 185 lb (83.9 kg)  Height: 5\' 9"  (1.753 m)   Body mass index is  27.32 kg/m.   Vision screen performed. See vision activity in chart for documentation Other exam performed: No:   Assessment: Welcome To Medicare Exam Physical Exam HENT:     Head: Normocephalic and atraumatic.     Right Ear: Tympanic membrane, ear canal and external ear normal.     Left Ear: Tympanic membrane, ear canal and external ear normal.     Nose: Nose normal.     Mouth/Throat:     Mouth: Mucous membranes are moist.     Pharynx: Oropharynx is clear.  Eyes:     Extraocular Movements: Extraocular movements intact.     Conjunctiva/sclera: Conjunctivae normal.     Pupils: Pupils are equal, round, and reactive to light.  Neck:     Thyroid: No thyroid mass, thyromegaly or thyroid tenderness.  Cardiovascular:     Rate and Rhythm: Normal rate and regular rhythm.     Pulses: Normal pulses.     Heart sounds: Normal heart sounds.  Pulmonary:     Effort: Pulmonary effort is normal.     Breath sounds: Normal breath sounds.  Abdominal:     General: Bowel sounds are normal.     Palpations: Abdomen is soft.  Genitourinary:    Comments: Patient declined.  Musculoskeletal:        General: Normal range of motion.     Right shoulder: Normal.     Left shoulder: Normal.     Right upper arm: Normal.     Left upper arm: Normal.     Right elbow: Normal.     Left elbow: Normal.     Right forearm: Normal.     Left forearm: Normal.     Right wrist: Normal.     Left wrist: Normal.     Right hand: Normal.     Left hand: Normal.     Cervical back: Normal, normal range of motion and neck supple.     Thoracic back: Normal.     Lumbar back: Normal.     Right hip: Normal.     Left hip: Normal.     Right upper leg: Normal.     Left upper leg: Normal.     Right knee: Normal.     Left knee: Normal.     Right lower leg: Normal.     Left lower leg: Normal.     Right ankle: Normal.     Left ankle: Normal.     Right foot: Normal.  Left foot: Normal.  Skin:    General: Skin is warm  and dry.     Capillary Refill: Capillary refill takes less than 2 seconds.  Neurological:     General: No focal deficit present.     Mental Status: He is alert and oriented to person, place, and time.  Psychiatric:        Mood and Affect: Mood normal.        Behavior: Behavior normal.     Plan: During the course of the visit the patient was educated and counseled about appropriate screening and preventive services including: Diabetes screening Nutrition counseling   1. Diabetes mellitus screening - Routine screening.  - Hemoglobin A1c  2. Hyperlipidemia, unspecified hyperlipidemia type - Routine screening.  - Lipid panel    The following orders were placed at today's visit; Orders Placed This Encounter  Procedures   Lipid panel   Hemoglobin A1c     An after visit summary with all of these plans was given to the patient.

## 2023-10-26 NOTE — Progress Notes (Signed)
Patient states at night his legs are restless.   Declined Flu vaccine.

## 2023-10-27 ENCOUNTER — Encounter: Payer: Self-pay | Admitting: Family

## 2023-10-27 LAB — LIPID PANEL
Chol/HDL Ratio: 4.2 ratio (ref 0.0–5.0)
Cholesterol, Total: 171 mg/dL (ref 100–199)
HDL: 41 mg/dL (ref >39)
LDL Chol Calc (NIH): 100 mg/dL — ABNORMAL HIGH (ref 0–99)
Triglycerides: 172 mg/dL — ABNORMAL HIGH (ref 0–149)
VLDL Cholesterol Cal: 30 mg/dL (ref 5–40)

## 2023-10-27 LAB — HEMOGLOBIN A1C
Est. average glucose Bld gHb Est-mCnc: 117 mg/dL
Hgb A1c MFr Bld: 5.7 % — ABNORMAL HIGH (ref 4.8–5.6)

## 2023-11-06 ENCOUNTER — Other Ambulatory Visit: Payer: Self-pay | Admitting: Gastroenterology

## 2023-11-06 DIAGNOSIS — R9389 Abnormal findings on diagnostic imaging of other specified body structures: Secondary | ICD-10-CM

## 2023-11-06 DIAGNOSIS — K2100 Gastro-esophageal reflux disease with esophagitis, without bleeding: Secondary | ICD-10-CM

## 2023-11-09 ENCOUNTER — Ambulatory Visit (INDEPENDENT_AMBULATORY_CARE_PROVIDER_SITE_OTHER): Payer: HMO | Admitting: Family

## 2023-11-09 VITALS — BP 132/77 | HR 82 | Temp 98.4°F | Ht 69.0 in | Wt 186.4 lb

## 2023-11-09 DIAGNOSIS — I10 Essential (primary) hypertension: Secondary | ICD-10-CM

## 2023-11-09 NOTE — Progress Notes (Signed)
States no concerns to discuss.  Declined Flu vaccine.

## 2023-11-09 NOTE — Progress Notes (Signed)
Patient ID: Gregg [name], male    DOB: Mar 19, 1956  MRN: [id number]  CC: Blood Pressure Check  Subjective: Gregg [name] is a 67 y.o. male who presents for blood pressure check.   His concerns today include:  - Doing well on Losartan, no issues/concerns. Home blood pressures at goal. States urinary incontinence has improved some since previous office visit but not to where he would like to be. Reports his next appointment with Physical Therapy and Oncology is in January 2025. I discussed with patient in detail to call their office on today to update them of the results of their recommendations to discontinue Hydrochlorothiazide. Patient verbalized understanding/agreement.  Patient Active Problem List   Diagnosis Date Noted   Prostate cancer (HCC) 06/28/2023   Genetic testing 05/28/2023   Malignant neoplasm of prostate (HCC) 05/18/2023   Status post total thyroidectomy 10/14/2022   Toxic multinodular goiter 10/02/2022   Adenomatous polyp of transverse colon 09/10/2022   Diverticulosis of colon without hemorrhage 09/10/2022   Grade II internal hemorrhoids 09/10/2022   Mild persistent asthma 07/10/2022   Former smoker 07/10/2022   Enlarged thyroid gland 01/13/2022   Symptomatic cholelithiasis 01/08/2022   S/P laparoscopic cholecystectomy 01/08/2022   Multinodular goiter 12/01/2021   Multiple thyroid nodules 12/01/2021   Hyperlipidemia 09/11/2021   Essential hypertension 07/22/2021   Anxiety and depression 07/02/2021   History of appendicitis 07/01/2021   Acute phlegmonous appendicitis s/p lap appendectomy 03/07/2021 03/07/2021   Kidney stone 03/07/2021   Bilateral inguinal hernia (BIH) s/p lap repair with mesh 06/06/2021 03/07/2021     Current Outpatient Medications on File Prior to Visit  Medication Sig Dispense Refill   atorvastatin (LIPITOR) 40 MG tablet Take 1 tablet (40 mg total) by mouth daily. 90 tablet 0   BREO ELLIPTA 100-25 MCG/ACT AEPB Inhale 1 puff into the  lungs daily. (Patient taking differently: Inhale 1 puff into the lungs daily as needed (shortness of breath).) 3 each 3   gatifloxacin (ZYMAXID) 0.5 % SOLN Place 1 drop into the left eye 4 (four) times daily.     ketorolac (ACULAR) 0.5 % ophthalmic solution Place 1 drop into the left eye 4 (four) times daily.     levocetirizine (XYZAL) 5 MG tablet Take 5 mg by mouth every evening.     levothyroxine (SYNTHROID) 112 MCG tablet Take 1 tablet (112 mcg total) by mouth daily. 90 tablet 3   losartan (COZAAR) 50 MG tablet Take 1 tablet (50 mg total) by mouth daily. 90 tablet 0   prednisoLONE acetate (PRED FORTE) 1 % ophthalmic suspension Place 1 drop into the right eye 4 (four) times daily.     sertraline (ZOLOFT) 25 MG tablet Take 1 tablet (25 mg total) by mouth daily. 90 tablet 0   albuterol (VENTOLIN HFA) 108 (90 Base) MCG/ACT inhaler Inhale 2 puffs into the lungs every 6 (six) hours as needed for wheezing or shortness of breath. 8 g 1   fluticasone (FLONASE) 50 MCG/ACT nasal spray Place 2 sprays into both nostrils daily. (Patient taking differently: Place 2 sprays into both nostrils daily as needed for allergies or rhinitis.) 16 g 0   pantoprazole (PROTONIX) 40 MG tablet TAKE 1 TABLET BY MOUTH DAILY BEFORE BREAKFAST (Patient not taking: Reported on 11/09/2023) 90 tablet 0   No current facility-administered medications on file prior to visit.    No Known Allergies  Social History   Socioeconomic History   Marital status: Single    Spouse name: [name]   Number  of children: 0   Years of education: Not on file   Highest education level: Master's degree (e.g., MA, MS, MEng, MEd, MSW, MBA)  Occupational History   Occupation: [employer]ing [employer]  Tobacco Use   Smoking status: Former    Current packs/day: 0.00    Average packs/day: 0.5 packs/day for 40.0 years (20.0 ttl pk-yrs)    Types: Cigarettes    Start date: 03/29/1977    Quit date: 03/29/2017    Years since quitting: 6.6     Passive exposure: Past   Smokeless tobacco: Never  Vaping Use   Vaping status: Never Used  Substance and Sexual Activity   Alcohol use: Yes    Comment: rare   Drug use: Never   Sexual activity: Not on file  Other Topics Concern   Not on file  Social History Narrative   Not on file   Social Determinants of Health   Financial Resource Strain: Low Risk  (10/25/2023)   Overall Financial Resource Strain (CARDIA)    Difficulty of Paying Living Expenses: Not hard at all  Food Insecurity: No Food Insecurity (10/25/2023)   Hunger Vital Sign    Worried About Running Out of Food in the Last Year: Never true    Ran Out of Food in the Last Year: Never true  Transportation Needs: No Transportation Needs (10/25/2023)   PRAPARE - Administrator, Civil Service (Medical): No    Lack of Transportation (Non-Medical): No  Physical Activity: Insufficiently Active (10/25/2023)   Exercise Vital Sign    Days of Exercise per Week: 4 days    Minutes of Exercise per Session: 20 min  Stress: No Stress Concern Present (10/25/2023)   [employer] of Occupational Health - Occupational Stress Questionnaire    Feeling of Stress : Not at all  Social Connections: Moderately Integrated (10/25/2023)   Social Connection and Isolation Panel [NHANES]    Frequency of Communication with Friends and Family: More than three times a week    Frequency of Social Gatherings with Friends and Family: Three times a week    Attends Religious Services: More than 4 times per year    Active Member of Clubs or Organizations: Yes    Attends Banker Meetings: More than 4 times per year    Marital Status: Never married  Intimate Partner Violence: Not At Risk (06/28/2023)   Humiliation, Afraid, Rape, and Kick questionnaire    Fear of Current or Ex-Partner: No    Emotionally Abused: No    Physically Abused: No    Sexually Abused: No    Family History  Problem Relation Age of Onset   Thyroid disease Mother     Breast cancer Mother 37 - 33   Thyroid cancer Father 41   Thyroid disease Sister    Prostate cancer Brother 57   Bone cancer Brother 50       agent orange exposure   Prostate cancer Paternal Uncle 25   Kidney cancer Paternal Grandfather 41 - 45   Stomach cancer Neg Hx    Colon cancer Neg Hx    Esophageal cancer Neg Hx    Rectal cancer Neg Hx    Colon polyps Neg Hx     Past Surgical History:  Procedure Laterality Date   CHOLECYSTECTOMY     COLONOSCOPY WITH PROPOFOL N/A 12/31/2021   Procedure: COLONOSCOPY WITH PROPOFOL;  Surgeon: Shellia Cleverly, DO;  Location: [facility];  Service: Gastroenterology;  Laterality: N/A;  ENDOSCOPIC MUCOSAL RESECTION N/A 12/31/2021   Procedure: ENDOSCOPIC MUCOSAL RESECTION;  Surgeon: Shellia Cleverly, DO;  Location: [facility];  Service: Gastroenterology;  Laterality: N/A;   EXTRACORPOREAL SHOCK WAVE LITHOTRIPSY Left 02/23/2019   Procedure: EXTRACORPOREAL SHOCK WAVE LITHOTRIPSY (ESWL);  Surgeon: Alfredo Martinez, MD;  Location: [facility];  Service: Urology;  Laterality: Left;   HEMOSTASIS CLIP PLACEMENT  12/31/2021   Procedure: HEMOSTASIS CLIP PLACEMENT;  Surgeon: Shellia Cleverly, DO;  Location: [facility];  Service: Gastroenterology;;   INGUINAL HERNIA REPAIR Bilateral 06/06/2021   Procedure: LAPAROSCOPIC BILATERAL INGUINAL HERNIA REPAIRS WITH MESH;  Surgeon: Karie Soda, MD;  Location: [facility] [place];  Service: General;  Laterality: Bilateral;  TAP BLOCK   LAPAROSCOPIC APPENDECTOMY N/A 03/07/2021   Procedure: APPENDECTOMY LAPAROSCOPIC;  Surgeon: Karie Soda, MD;  Location: [facility];  Service: General;  Laterality: N/A;   LYMPHADENECTOMY Bilateral 06/28/2023   Procedure: BILATERAL PELVIC LYMPHADENECTOMY;  Surgeon: Heloise Purpura, MD;  Location: [facility];  Service: Urology;  Laterality: Bilateral;   PROSTATE BIOPSY     ROBOT ASSISTED LAPAROSCOPIC RADICAL PROSTATECTOMY N/A 06/28/2023   Procedure: XI ROBOTIC ASSISTED LAPAROSCOPIC  RADICAL PROSTATECTOMY LEVEL 3;  Surgeon: Heloise Purpura, MD;  Location: [facility];  Service: Urology;  Laterality: N/A;  210 MINUTES NEEDED FOR CASE   SUBMUCOSAL LIFTING INJECTION  12/31/2021   Procedure: SUBMUCOSAL LIFTING INJECTION;  Surgeon: Shellia Cleverly, DO;  Location: [facility];  Service: Gastroenterology;;   THYROIDECTOMY N/A 10/02/2022   Procedure: TOTAL THYROIDECTOMY;  Surgeon: Darnell Level, MD;  Location: [facility];  Service: General;  Laterality: N/A;    ROS: Review of Systems Negative except as stated above  PHYSICAL EXAM: BP 132/77   Pulse 82   Temp 98.4 F (36.9 C) (Oral)   Ht 5\' 9"  (1.753 m)   Wt 186 lb 6.4 oz (84.6 kg)   SpO2 96%   BMI 27.53 kg/m   Physical Exam HENT:     Head: Normocephalic and atraumatic.     Nose: Nose normal.     Mouth/Throat:     Mouth: Mucous membranes are moist.     Pharynx: Oropharynx is clear.  Eyes:     Extraocular Movements: Extraocular movements intact.     Conjunctiva/sclera: Conjunctivae normal.     Pupils: Pupils are equal, round, and reactive to light.  Cardiovascular:     Rate and Rhythm: Normal rate and regular rhythm.     Pulses: Normal pulses.     Heart sounds: Normal heart sounds.  Pulmonary:     Effort: Pulmonary effort is normal.     Breath sounds: Normal breath sounds.  Musculoskeletal:        General: Normal range of motion.     Cervical back: Normal range of motion and neck supple.  Neurological:     General: No focal deficit present.     Mental Status: He is alert and oriented to person, place, and time.  Psychiatric:        Mood and Affect: Mood normal.        Behavior: Behavior normal.     ASSESSMENT AND PLAN: 1. Primary hypertension - Continue Losartan as prescribed. No refills needed as of present.  - Counseled on blood pressure goal of less than 130/80, low-sodium, DASH diet, medication compliance, and 150 minutes of moderate intensity exercise per week as tolerated. Counseled on medication  adherence and adverse effects. - Follow-up with primary provider in 3 months or sooner if needed.    Patient was  given the opportunity to ask questions.  Patient verbalized understanding of the plan and was able to repeat key elements of the plan. Patient was given clear instructions to go to Emergency Department or return to medical center if symptoms don't improve, worsen, or new problems develop.The patient verbalized understanding.    Return in about 3 months (around 02/09/2024) for Follow-Up or next available chronic conditions.  Rema Fendt, NP

## 2023-12-28 DIAGNOSIS — M62838 Other muscle spasm: Secondary | ICD-10-CM | POA: Diagnosis not present

## 2023-12-28 DIAGNOSIS — M6281 Muscle weakness (generalized): Secondary | ICD-10-CM | POA: Diagnosis not present

## 2023-12-28 DIAGNOSIS — N393 Stress incontinence (female) (male): Secondary | ICD-10-CM | POA: Diagnosis not present

## 2024-02-01 ENCOUNTER — Other Ambulatory Visit: Payer: Self-pay | Admitting: Family

## 2024-02-01 DIAGNOSIS — I10 Essential (primary) hypertension: Secondary | ICD-10-CM

## 2024-02-02 ENCOUNTER — Other Ambulatory Visit: Payer: Self-pay

## 2024-02-02 DIAGNOSIS — I10 Essential (primary) hypertension: Secondary | ICD-10-CM

## 2024-02-02 MED ORDER — LOSARTAN POTASSIUM 50 MG PO TABS: 50.0000 mg | ORAL_TABLET | Freq: Every day | ORAL | 0 refills | Status: DC

## 2024-02-07 ENCOUNTER — Encounter: Payer: Self-pay | Admitting: Gastroenterology

## 2024-02-14 ENCOUNTER — Encounter: Payer: Self-pay | Admitting: Family

## 2024-02-14 ENCOUNTER — Ambulatory Visit (INDEPENDENT_AMBULATORY_CARE_PROVIDER_SITE_OTHER): Payer: HMO | Admitting: Family

## 2024-02-14 VITALS — BP 159/84 | HR 74 | Temp 98.3°F | Resp 16 | Ht 69.0 in | Wt 186.0 lb

## 2024-02-14 DIAGNOSIS — I10 Essential (primary) hypertension: Secondary | ICD-10-CM

## 2024-02-14 MED ORDER — LOSARTAN POTASSIUM 100 MG PO TABS: 100.0000 mg | ORAL_TABLET | Freq: Every day | ORAL | 0 refills | Status: DC

## 2024-02-14 NOTE — Progress Notes (Signed)
 Patient ID: Gregg [name], male    DOB: 1955/12/30  MRN: [id number]  CC: Chronic Conditions Follow-Up  Subjective: Gregg [name] is a 68 y.o. male who presents for chronic conditions follow-up.   His concerns today include:  - Doing well on Valsartan, no issues/concerns. Checking blood pressure at home. He does not complain of red flag symptoms such as but not limited to chest pain, shortness of breath, worst headache of life, nausea/vomiting.    Patient Active Problem List   Diagnosis Date Noted   Prostate cancer (HCC) 06/28/2023   Genetic testing 05/28/2023   Malignant neoplasm of prostate (HCC) 05/18/2023   Status post total thyroidectomy 10/14/2022   Toxic multinodular goiter 10/02/2022   Adenomatous polyp of transverse colon 09/10/2022   Diverticulosis of colon without hemorrhage 09/10/2022   Grade II internal hemorrhoids 09/10/2022   Mild persistent asthma 07/10/2022   Former smoker 07/10/2022   Enlarged thyroid gland 01/13/2022   Symptomatic cholelithiasis 01/08/2022   S/P laparoscopic cholecystectomy 01/08/2022   Multinodular goiter 12/01/2021   Multiple thyroid nodules 12/01/2021   Hyperlipidemia 09/11/2021   Essential hypertension 07/22/2021   Anxiety and depression 07/02/2021   History of appendicitis 07/01/2021   Acute phlegmonous appendicitis s/p lap appendectomy 03/07/2021 03/07/2021   Kidney stone 03/07/2021   Bilateral inguinal hernia (BIH) s/p lap repair with mesh 06/06/2021 03/07/2021     Current Outpatient Medications on File Prior to Visit  Medication Sig Dispense Refill   atorvastatin (LIPITOR) 40 MG tablet Take 1 tablet (40 mg total) by mouth daily. 90 tablet 0   BREO ELLIPTA 100-25 MCG/ACT AEPB Inhale 1 puff into the lungs daily. (Patient taking differently: Inhale 1 puff into the lungs daily as needed (shortness of breath).) 3 each 3   levothyroxine (SYNTHROID) 112 MCG tablet Take 1 tablet (112 mcg total) by mouth daily. 90 tablet 3   losartan  (COZAAR) 50 MG tablet Take 1 tablet (50 mg total) by mouth daily. 90 tablet 0   pantoprazole (PROTONIX) 40 MG tablet TAKE 1 TABLET BY MOUTH DAILY BEFORE BREAKFAST 90 tablet 0   tadalafil (CIALIS) 5 MG tablet Take 5 mg by mouth daily.     albuterol (VENTOLIN HFA) 108 (90 Base) MCG/ACT inhaler Inhale 2 puffs into the lungs every 6 (six) hours as needed for wheezing or shortness of breath. 8 g 1   fluticasone (FLONASE) 50 MCG/ACT nasal spray Place 2 sprays into both nostrils daily. (Patient taking differently: Place 2 sprays into both nostrils daily as needed for allergies or rhinitis.) 16 g 0   gatifloxacin (ZYMAXID) 0.5 % SOLN Place 1 drop into the left eye 4 (four) times daily.     ketorolac (ACULAR) 0.5 % ophthalmic solution Place 1 drop into the left eye 4 (four) times daily.     levocetirizine (XYZAL) 5 MG tablet Take 5 mg by mouth every evening.     prednisoLONE acetate (PRED FORTE) 1 % ophthalmic suspension Place 1 drop into the right eye 4 (four) times daily.     sertraline (ZOLOFT) 25 MG tablet Take 1 tablet (25 mg total) by mouth daily. 90 tablet 0   No current facility-administered medications on file prior to visit.    No Known Allergies  Social History   Socioeconomic History   Marital status: Single    Spouse name: [name]   Number of children: 0   Years of education: Not on file   Highest education level: Bachelor's degree (e.g., BA, AB, BS)  Occupational  History   Occupation: [employer]  Tobacco Use   Smoking status: Former    Current packs/day: 0.00    Average packs/day: 0.5 packs/day for 40.0 years (20.0 ttl pk-yrs)    Types: Cigarettes    Start date: 03/29/1977    Quit date: 03/29/2017    Years since quitting: 6.8    Passive exposure: Past   Smokeless tobacco: Never  Vaping Use   Vaping status: Never Used  Substance and Sexual Activity   Alcohol use: Yes    Comment: rare   Drug use: Never   Sexual activity: Not on file  Other Topics Concern    Not on file  Social History Narrative   Not on file   Social Drivers of Health   Financial Resource Strain: Low Risk  (02/10/2024)   Overall Financial Resource Strain (CARDIA)    Difficulty of Paying Living Expenses: Not hard at all  Food Insecurity: No Food Insecurity (02/10/2024)   Hunger Vital Sign    Worried About Running Out of Food in the Last Year: Never true    Ran Out of Food in the Last Year: Never true  Transportation Needs: No Transportation Needs (02/10/2024)   PRAPARE - Administrator, Civil Service (Medical): No    Lack of Transportation (Non-Medical): No  Physical Activity: Insufficiently Active (02/10/2024)   Exercise Vital Sign    Days of Exercise per Week: 4 days    Minutes of Exercise per Session: 20 min  Stress: No Stress Concern Present (02/10/2024)   [employer] of Occupational Health - Occupational Stress Questionnaire    Feeling of Stress : Not at all  Social Connections: Moderately Integrated (02/10/2024)   Social Connection and Isolation Panel [NHANES]    Frequency of Communication with Friends and Family: More than three times a week    Frequency of Social Gatherings with Friends and Family: Twice a week    Attends Religious Services: More than 4 times per year    Active Member of Clubs or Organizations: Yes    Attends Banker Meetings: More than 4 times per year    Marital Status: Never married  Intimate Partner Violence: Not At Risk (06/28/2023)   Humiliation, Afraid, Rape, and Kick questionnaire    Fear of Current or Ex-Partner: No    Emotionally Abused: No    Physically Abused: No    Sexually Abused: No    Family History  Problem Relation Age of Onset   Thyroid disease Mother    Breast cancer Mother 2 - 40   Thyroid cancer Father 53   Thyroid disease Sister    Prostate cancer Brother 52   Bone cancer Brother 50       agent orange exposure   Prostate cancer Paternal Uncle 76   Kidney cancer Paternal Grandfather  33 - 43   Stomach cancer Neg Hx    Colon cancer Neg Hx    Esophageal cancer Neg Hx    Rectal cancer Neg Hx    Colon polyps Neg Hx     Past Surgical History:  Procedure Laterality Date   CHOLECYSTECTOMY     COLONOSCOPY WITH PROPOFOL N/A 12/31/2021   Procedure: COLONOSCOPY WITH PROPOFOL;  Surgeon: Shellia Cleverly, DO;  Location: [facility];  Service: Gastroenterology;  Laterality: N/A;   ENDOSCOPIC MUCOSAL RESECTION N/A 12/31/2021   Procedure: ENDOSCOPIC MUCOSAL RESECTION;  Surgeon: Shellia Cleverly, DO;  Location: [facility];  Service: Gastroenterology;  Laterality: N/A;  EXTRACORPOREAL SHOCK WAVE LITHOTRIPSY Left 02/23/2019   Procedure: EXTRACORPOREAL SHOCK WAVE LITHOTRIPSY (ESWL);  Surgeon: Alfredo Martinez, MD;  Location: [facility];  Service: Urology;  Laterality: Left;   HEMOSTASIS CLIP PLACEMENT  12/31/2021   Procedure: HEMOSTASIS CLIP PLACEMENT;  Surgeon: Shellia Cleverly, DO;  Location: [facility];  Service: Gastroenterology;;   INGUINAL HERNIA REPAIR Bilateral 06/06/2021   Procedure: LAPAROSCOPIC BILATERAL INGUINAL HERNIA REPAIRS WITH MESH;  Surgeon: Karie Soda, MD;  Location: [facility] [place];  Service: General;  Laterality: Bilateral;  TAP BLOCK   LAPAROSCOPIC APPENDECTOMY N/A 03/07/2021   Procedure: APPENDECTOMY LAPAROSCOPIC;  Surgeon: Karie Soda, MD;  Location: [facility];  Service: General;  Laterality: N/A;   LYMPHADENECTOMY Bilateral 06/28/2023   Procedure: BILATERAL PELVIC LYMPHADENECTOMY;  Surgeon: Heloise Purpura, MD;  Location: [facility];  Service: Urology;  Laterality: Bilateral;   PROSTATE BIOPSY     ROBOT ASSISTED LAPAROSCOPIC RADICAL PROSTATECTOMY N/A 06/28/2023   Procedure: XI ROBOTIC ASSISTED LAPAROSCOPIC RADICAL PROSTATECTOMY LEVEL 3;  Surgeon: Heloise Purpura, MD;  Location: [facility];  Service: Urology;  Laterality: N/A;  210 MINUTES NEEDED FOR CASE   SUBMUCOSAL LIFTING INJECTION  12/31/2021   Procedure: SUBMUCOSAL LIFTING INJECTION;  Surgeon:  Shellia Cleverly, DO;  Location: [facility];  Service: Gastroenterology;;   THYROIDECTOMY N/A 10/02/2022   Procedure: TOTAL THYROIDECTOMY;  Surgeon: Darnell Level, MD;  Location: [facility];  Service: General;  Laterality: N/A;    ROS: Review of Systems Negative except as stated above  PHYSICAL EXAM: BP (!) 159/84   Pulse 74   Temp 98.3 F (36.8 C) (Oral)   Resp 16   Ht 5\' 9"  (1.753 m)   Wt 186 lb (84.4 kg)   SpO2 94%   BMI 27.47 kg/m   Physical Exam HENT:     Head: Normocephalic and atraumatic.     Nose: Nose normal.     Mouth/Throat:     Mouth: Mucous membranes are moist.     Pharynx: Oropharynx is clear.  Eyes:     Extraocular Movements: Extraocular movements intact.     Conjunctiva/sclera: Conjunctivae normal.     Pupils: Pupils are equal, round, and reactive to light.  Cardiovascular:     Rate and Rhythm: Normal rate and regular rhythm.     Pulses: Normal pulses.     Heart sounds: Normal heart sounds.  Pulmonary:     Effort: Pulmonary effort is normal.     Breath sounds: Normal breath sounds.  Musculoskeletal:        General: Normal range of motion.     Cervical back: Normal range of motion and neck supple.  Neurological:     General: No focal deficit present.     Mental Status: He is alert and oriented to person, place, and time.  Psychiatric:        Mood and Affect: Mood normal.        Behavior: Behavior normal.     ASSESSMENT AND PLAN: 1. Primary hypertension (Primary) - Blood pressure not at goal during today's visit. Patient asymptomatic without chest pressure, chest pain, palpitations, shortness of breath, worst headache of life, and any additional red flag symptoms. - Increase Losartan from 50 mg to 100 mg as prescribed.  - Counseled on blood pressure goal of less than 140/90, low-sodium, DASH diet, medication compliance, 150 minutes of moderate intensity exercise per week as tolerated. Discussed medication compliance, adverse effects. - Follow-up  with primary provider in 4 weeks or sooner if needed.  - losartan (  COZAAR) 100 MG tablet; Take 1 tablet (100 mg total) by mouth daily.  Dispense: 90 tablet; Refill: 0   Patient was given the opportunity to ask questions.  Patient verbalized understanding of the plan and was able to repeat key elements of the plan. Patient was given clear instructions to go to Emergency Department or return to medical center if symptoms don't improve, worsen, or new problems develop.The patient verbalized understanding.   Requested Prescriptions   Signed Prescriptions Disp Refills   losartan (COZAAR) 100 MG tablet 90 tablet 0    Sig: Take 1 tablet (100 mg total) by mouth daily.    Return in about 4 weeks (around 03/13/2024) for Follow-Up or next available chronic conditions.  Rema Fendt, NP

## 2024-03-05 ENCOUNTER — Other Ambulatory Visit: Payer: Self-pay | Admitting: Pulmonary Disease

## 2024-03-06 ENCOUNTER — Other Ambulatory Visit: Payer: Self-pay | Admitting: Pulmonary Disease

## 2024-03-08 ENCOUNTER — Other Ambulatory Visit: Payer: Self-pay | Admitting: Pulmonary Disease

## 2024-03-08 MED ORDER — FLUTICASONE FUROATE-VILANTEROL 100-25 MCG/ACT IN AEPB: 1.0000 | INHALATION_SPRAY | Freq: Every day | RESPIRATORY_TRACT | 2 refills | Status: AC

## 2024-03-12 ENCOUNTER — Other Ambulatory Visit: Payer: Self-pay | Admitting: Family

## 2024-03-12 DIAGNOSIS — F419 Anxiety disorder, unspecified: Secondary | ICD-10-CM

## 2024-03-13 NOTE — Telephone Encounter (Signed)
 Complete

## 2024-03-14 ENCOUNTER — Ambulatory Visit (INDEPENDENT_AMBULATORY_CARE_PROVIDER_SITE_OTHER): Payer: HMO | Admitting: Family

## 2024-03-14 ENCOUNTER — Encounter: Payer: Self-pay | Admitting: Family

## 2024-03-14 VITALS — BP 143/89 | HR 78 | Temp 98.6°F | Ht 69.0 in | Wt 186.4 lb

## 2024-03-14 DIAGNOSIS — I10 Essential (primary) hypertension: Secondary | ICD-10-CM

## 2024-03-14 MED ORDER — AMLODIPINE BESYLATE 5 MG PO TABS
5.0000 mg | ORAL_TABLET | Freq: Every day | ORAL | 0 refills | Status: DC
Start: 2024-03-14 — End: 2024-06-08

## 2024-03-14 NOTE — Progress Notes (Signed)
 Patient ID: Gregg [name], male    DOB: [birth year]  MRN: [id number]  CC: Chronic Conditions Follow-Up  Subjective: Gregg [name] is a 68 y.o. male who presents for chronic conditions follow-up.   His concerns today include:  - Doing well on Losartan, no issues/concerns. States home blood pressures are at goal. Patient has blood pressure monitor today in office and confirmed not calibrated correctly. States Urology discontinued Hydrochlorothiazide to see if it would help with increased urination. Patient states this has not helped. States he received a letter from Cardiology to schedule an appointment and states he plans to do so soon. He does not complain of red flag symptoms such as but not limited to chest pain, shortness of breath, worst headache of life, nausea/vomiting.   Patient Active Problem List   Diagnosis Date Noted   Prostate cancer (HCC) 06/28/2023   Genetic testing 05/28/2023   Malignant neoplasm of prostate (HCC) 05/18/2023   Status post total thyroidectomy 10/14/2022   Toxic multinodular goiter 10/02/2022   Adenomatous polyp of transverse colon 09/10/2022   Diverticulosis of colon without hemorrhage 09/10/2022   Grade II internal hemorrhoids 09/10/2022   Mild persistent asthma 07/10/2022   Former smoker 07/10/2022   Enlarged thyroid gland 01/13/2022   Symptomatic cholelithiasis 01/08/2022   S/P laparoscopic cholecystectomy 01/08/2022   Multinodular goiter 12/01/2021   Multiple thyroid nodules 12/01/2021   Hyperlipidemia 09/11/2021   Essential hypertension 07/22/2021   Anxiety and depression 07/02/2021   History of appendicitis 07/01/2021   Acute phlegmonous appendicitis s/p lap appendectomy 03/07/2021 03/07/2021   Kidney stone 03/07/2021   Bilateral inguinal hernia (BIH) s/p lap repair with mesh 06/06/2021 03/07/2021     Current Outpatient Medications on File Prior to Visit  Medication Sig Dispense Refill   albuterol (VENTOLIN HFA) 108 (90 Base) MCG/ACT  inhaler Inhale 2 puffs into the lungs every 6 (six) hours as needed for wheezing or shortness of breath. 8 g 1   atorvastatin (LIPITOR) 40 MG tablet Take 1 tablet (40 mg total) by mouth daily. 90 tablet 0   fluticasone (FLONASE) 50 MCG/ACT nasal spray Place 2 sprays into both nostrils daily. (Patient taking differently: Place 2 sprays into both nostrils daily as needed for allergies or rhinitis.) 16 g 0   fluticasone furoate-vilanterol (BREO ELLIPTA) 100-25 MCG/ACT AEPB Inhale 1 puff into the lungs daily. 60 each 2   gatifloxacin (ZYMAXID) 0.5 % SOLN Place 1 drop into the left eye 4 (four) times daily.     ketorolac (ACULAR) 0.5 % ophthalmic solution Place 1 drop into the left eye 4 (four) times daily.     levocetirizine (XYZAL) 5 MG tablet Take 5 mg by mouth every evening.     levothyroxine (SYNTHROID) 112 MCG tablet Take 1 tablet (112 mcg total) by mouth daily. 90 tablet 3   losartan (COZAAR) 100 MG tablet Take 1 tablet (100 mg total) by mouth daily. 90 tablet 0   losartan (COZAAR) 50 MG tablet Take 1 tablet (50 mg total) by mouth daily. 90 tablet 0   pantoprazole (PROTONIX) 40 MG tablet TAKE 1 TABLET BY MOUTH DAILY BEFORE BREAKFAST 90 tablet 0   prednisoLONE acetate (PRED FORTE) 1 % ophthalmic suspension Place 1 drop into the right eye 4 (four) times daily.     sertraline (ZOLOFT) 25 MG tablet TAKE 1 TABLET (25 MG TOTAL) BY MOUTH DAILY. 90 tablet 0   tadalafil (CIALIS) 5 MG tablet Take 5 mg by mouth daily.     No current  facility-administered medications on file prior to visit.    Allergies  Allergen Reactions   Shellfish Allergy Anaphylaxis and Swelling    Social History   Socioeconomic History   Marital status: Single    Spouse name: [name]   Number of children: 0   Years of education: Not on file   Highest education level: Bachelor's degree (e.g., BA, AB, BS)  Occupational History   Occupation: [employer]  Tobacco Use   Smoking status: Former    Current  packs/day: 0.00    Average packs/day: 0.5 packs/day for 40.0 years (20.0 ttl pk-yrs)    Types: Cigarettes    Start date: 03/29/1977    Quit date: 03/29/2017    Years since quitting: 6.9    Passive exposure: Past   Smokeless tobacco: Never  Vaping Use   Vaping status: Never Used  Substance and Sexual Activity   Alcohol use: Yes    Comment: rare   Drug use: Never   Sexual activity: Not on file  Other Topics Concern   Not on file  Social History Narrative   Not on file   Social Drivers of Health   Financial Resource Strain: Low Risk  (02/10/2024)   Overall Financial Resource Strain (CARDIA)    Difficulty of Paying Living Expenses: Not hard at all  Food Insecurity: No Food Insecurity (02/10/2024)   Hunger Vital Sign    Worried About Running Out of Food in the Last Year: Never true    Ran Out of Food in the Last Year: Never true  Transportation Needs: No Transportation Needs (02/10/2024)   PRAPARE - Administrator, Civil Service (Medical): No    Lack of Transportation (Non-Medical): No  Physical Activity: Insufficiently Active (02/10/2024)   Exercise Vital Sign    Days of Exercise per Week: 4 days    Minutes of Exercise per Session: 20 min  Stress: No Stress Concern Present (02/10/2024)   [employer] of Occupational Health - Occupational Stress Questionnaire    Feeling of Stress : Not at all  Social Connections: Moderately Integrated (02/10/2024)   Social Connection and Isolation Panel [NHANES]    Frequency of Communication with Friends and Family: More than three times a week    Frequency of Social Gatherings with Friends and Family: Twice a week    Attends Religious Services: More than 4 times per year    Active Member of [employer] or Organizations: Yes    Attends Banker Meetings: More than 4 times per year    Marital Status: Never married  Intimate Partner Violence: Not At Risk (06/28/2023)   Humiliation, Afraid, Rape, and Kick questionnaire    Fear of  Current or Ex-Partner: No    Emotionally Abused: No    Physically Abused: No    Sexually Abused: No    Family History  Problem Relation Age of Onset   Thyroid disease Mother    Breast cancer Mother 44 - 13   Thyroid cancer Father 15   Thyroid disease Sister    Prostate cancer Brother 69   Bone cancer Brother 50       agent orange exposure   Prostate cancer Paternal Uncle 11   Kidney cancer Paternal Grandfather 25 - 66   Stomach cancer Neg Hx    Colon cancer Neg Hx    Esophageal cancer Neg Hx    Rectal cancer Neg Hx    Colon polyps Neg Hx     Past Surgical  History:  Procedure Laterality Date   CHOLECYSTECTOMY     COLONOSCOPY WITH PROPOFOL N/A 12/31/2021   Procedure: COLONOSCOPY WITH PROPOFOL;  Surgeon: Shellia Cleverly, DO;  Location: [facility];  Service: Gastroenterology;  Laterality: N/A;   ENDOSCOPIC MUCOSAL RESECTION N/A 12/31/2021   Procedure: ENDOSCOPIC MUCOSAL RESECTION;  Surgeon: Shellia Cleverly, DO;  Location: [facility];  Service: Gastroenterology;  Laterality: N/A;   EXTRACORPOREAL SHOCK WAVE LITHOTRIPSY Left 02/23/2019   Procedure: EXTRACORPOREAL SHOCK WAVE LITHOTRIPSY (ESWL);  Surgeon: Alfredo Martinez, MD;  Location: [facility];  Service: Urology;  Laterality: Left;   HEMOSTASIS CLIP PLACEMENT  12/31/2021   Procedure: HEMOSTASIS CLIP PLACEMENT;  Surgeon: Shellia Cleverly, DO;  Location: [facility];  Service: Gastroenterology;;   INGUINAL HERNIA REPAIR Bilateral 06/06/2021   Procedure: LAPAROSCOPIC BILATERAL INGUINAL HERNIA REPAIRS WITH MESH;  Surgeon: Karie Soda, MD;  Location: [facility];  Service: General;  Laterality: Bilateral;  TAP BLOCK   LAPAROSCOPIC APPENDECTOMY N/A 03/07/2021   Procedure: APPENDECTOMY LAPAROSCOPIC;  Surgeon: Karie Soda, MD;  Location: [facility];  Service: General;  Laterality: N/A;   LYMPHADENECTOMY Bilateral 06/28/2023   Procedure: BILATERAL PELVIC LYMPHADENECTOMY;  Surgeon: Heloise Purpura, MD;  Location: [facility];  Service: Urology;  Laterality: Bilateral;   PROSTATE BIOPSY     ROBOT ASSISTED LAPAROSCOPIC RADICAL PROSTATECTOMY N/A 06/28/2023   Procedure: XI ROBOTIC ASSISTED LAPAROSCOPIC RADICAL PROSTATECTOMY LEVEL 3;  Surgeon: Heloise Purpura, MD;  Location: [facility];  Service: Urology;  Laterality: N/A;  210 MINUTES NEEDED FOR CASE   SUBMUCOSAL LIFTING INJECTION  12/31/2021   Procedure: SUBMUCOSAL LIFTING INJECTION;  Surgeon: Shellia Cleverly, DO;  Location: [facility];  Service: Gastroenterology;;   THYROIDECTOMY N/A 10/02/2022   Procedure: TOTAL THYROIDECTOMY;  Surgeon: Darnell Level, MD;  Location: [facility];  Service: General;  Laterality: N/A;    ROS: Review of Systems Negative except as stated above  PHYSICAL EXAM: BP (!) 143/89   Pulse 78   Temp 98.6 F (37 C) (Oral)   Ht 5\' 9"  (1.753 m)   Wt 186 lb 6.4 oz (84.6 kg)   SpO2 94%   BMI 27.53 kg/m   Physical Exam HENT:     Head: Normocephalic and atraumatic.     Nose: Nose normal.     Mouth/Throat:     Mouth: Mucous membranes are moist.     Pharynx: Oropharynx is clear.  Eyes:     Extraocular Movements: Extraocular movements intact.     Conjunctiva/sclera: Conjunctivae normal.     Pupils: Pupils are equal, round, and reactive to light.  Cardiovascular:     Rate and Rhythm: Normal rate and regular rhythm.     Pulses: Normal pulses.     Heart sounds: Normal heart sounds.  Pulmonary:     Effort: Pulmonary effort is normal.     Breath sounds: Normal breath sounds.  Musculoskeletal:        General: Normal range of motion.     Cervical back: Normal range of motion and neck supple.  Neurological:     General: No focal deficit present.     Mental Status: He is alert and oriented to person, place, and time.  Psychiatric:        Mood and Affect: Mood normal.        Behavior: Behavior normal.    ASSESSMENT AND PLAN: 1. Primary hypertension (Primary) - Blood pressure not at goal during today's visit. Patient asymptomatic without  chest pressure, chest pain, palpitations, shortness of breath,  worst headache of life, and any additional red flag symptoms. - Continue Losartan as prescribed. No refills needed as of present.  - Trial Amlodipine as prescribed.  - Routine screening.  - Counseled on blood pressure goal of less than 130/80, low-sodium, DASH diet, medication compliance, and 150 minutes of moderate intensity exercise per week as tolerated. Counseled on medication adherence and adverse effects. - Keep all scheduled appointments with Cardiology.  - Follow-up with primary provider as scheduled. - Basic Metabolic Panel - amLODipine (NORVASC) 5 MG tablet; Take 1 tablet (5 mg total) by mouth daily.  Dispense: 90 tablet; Refill: 0   Patient was given the opportunity to ask questions.  Patient verbalized understanding of the plan and was able to repeat key elements of the plan. Patient was given clear instructions to go to Emergency Department or return to medical center if symptoms don't improve, worsen, or new problems develop.The patient verbalized understanding.   Orders Placed This Encounter  Procedures   Basic Metabolic Panel     Requested Prescriptions   Signed Prescriptions Disp Refills   amLODipine (NORVASC) 5 MG tablet 90 tablet 0    Sig: Take 1 tablet (5 mg total) by mouth daily.    Return in about 4 weeks (around 04/11/2024) for Follow-Up or next available chronic conditions.  Rema Fendt, NP

## 2024-03-14 NOTE — Progress Notes (Signed)
 Patient states he has questions about blood pressure things.

## 2024-03-15 ENCOUNTER — Encounter: Payer: Self-pay | Admitting: Family

## 2024-03-15 LAB — BASIC METABOLIC PANEL
BUN/Creatinine Ratio: 12 (ref 10–24)
BUN: 13 mg/dL (ref 8–27)
CO2: 25 mmol/L (ref 20–29)
Calcium: 9.9 mg/dL (ref 8.6–10.2)
Chloride: 101 mmol/L (ref 96–106)
Creatinine, Ser: 1.05 mg/dL (ref 0.76–1.27)
Glucose: 97 mg/dL (ref 70–99)
Potassium: 4.4 mmol/L (ref 3.5–5.2)
Sodium: 140 mmol/L (ref 134–144)
eGFR: 79 mL/min/{1.73_m2} (ref >59)

## 2024-03-27 ENCOUNTER — Telehealth: Admitting: Physician Assistant

## 2024-03-27 DIAGNOSIS — J302 Other seasonal allergic rhinitis: Secondary | ICD-10-CM

## 2024-03-27 MED ORDER — FLUTICASONE PROPIONATE 50 MCG/ACT NA SUSP
2.0000 | Freq: Every day | NASAL | 0 refills | Status: AC
Start: 2024-03-27 — End: ?

## 2024-03-27 MED ORDER — OLOPATADINE HCL 0.1 % OP SOLN
1.0000 [drp] | Freq: Two times a day (BID) | OPHTHALMIC | 0 refills | Status: DC
Start: 2024-03-27 — End: 2024-06-27

## 2024-03-27 MED ORDER — FEXOFENADINE HCL 180 MG PO TABS
180.0000 mg | ORAL_TABLET | Freq: Every day | ORAL | 0 refills | Status: DC
Start: 2024-03-27 — End: 2024-06-27

## 2024-03-27 NOTE — Progress Notes (Signed)
 E visit for Allergic Rhinitis We are sorry that you are not feeling well.  Here is how we plan to help!  Based on what you have shared with me it looks like you have Allergic Rhinitis.  Rhinitis is when a reaction occurs that causes nasal congestion, runny nose, sneezing, and itching.  Most types of rhinitis are caused by an inflammation and are associated with symptoms in the eyes ears or throat. There are several types of rhinitis.  The most common are acute rhinitis, which is usually caused by a viral illness, allergic or seasonal rhinitis, and nonallergic or year-round rhinitis.  Nasal allergies occur certain times of the year.  Allergic rhinitis is caused when allergens in the air trigger the release of histamine in the body.  Histamine causes itching, swelling, and fluid to build up in the fragile linings of the nasal passages, sinuses and eyelids.  An itchy nose and clear discharge are common.  I recommend the following over the counter treatments: You should take a daily dose of antihistamine; I have prescribed Allegra 180mg  daily  I also would recommend a nasal spray: I have prescribed Flonase 2 sprays into each nostril once daily  You may also benefit from eye drops such as: I have prescribed Olopatadine eye drops Use 1 drop in each eye twice daily  HOME CARE:  You can use an over-the-counter saline nasal spray as needed Avoid areas where there is heavy dust, mites, or molds Stay indoors on windy days during the pollen season Keep windows closed in home, at least in bedroom; use air conditioner. Use high-efficiency house air filter Keep windows closed in car, turn AC on re-circulate Avoid playing out with dog during pollen season  GET HELP RIGHT AWAY IF:  If your symptoms do not improve within 10 days You become short of breath You develop yellow or green discharge from your nose for over 3 days You have coughing fits  MAKE SURE YOU:  Understand these instructions Will  watch your condition Will get help right away if you are not doing well or get worse  Thank you for choosing an e-visit. Your e-visit answers were reviewed by a board certified advanced clinical practitioner to complete your personal care plan. Depending upon the condition, your plan could have included both over the counter or prescription medications. Please review your pharmacy choice. Be sure that the pharmacy you have chosen is open so that you can pick up your prescription now.  If there is a problem you may message your provider in MyChart to have the prescription routed to another pharmacy. Your safety is important to [place]. If you have drug allergies check your prescription carefully.  For the next 24 hours, you can use MyChart to ask questions about today's visit, request a non-urgent call back, or ask for a work or school excuse from your e-visit provider. You will get an email in the next two days asking about your experience. I hope that your e-visit has been valuable and will speed your recovery.       I have spent 5 minutes in review of e-visit questionnaire, review and updating patient chart, medical decision making and response to patient.   Margaretann Loveless, PA-C

## 2024-03-28 ENCOUNTER — Telehealth: Admitting: Nurse Practitioner

## 2024-03-28 ENCOUNTER — Telehealth: Admitting: Physician Assistant

## 2024-03-28 ENCOUNTER — Encounter: Payer: Self-pay | Admitting: Cardiovascular Disease

## 2024-03-28 ENCOUNTER — Ambulatory Visit: Attending: Cardiovascular Disease | Admitting: Cardiovascular Disease

## 2024-03-28 VITALS — BP 142/76 | HR 92 | Ht 69.0 in | Wt 186.2 lb

## 2024-03-28 DIAGNOSIS — R0789 Other chest pain: Secondary | ICD-10-CM

## 2024-03-28 DIAGNOSIS — J40 Bronchitis, not specified as acute or chronic: Secondary | ICD-10-CM

## 2024-03-28 DIAGNOSIS — E782 Mixed hyperlipidemia: Secondary | ICD-10-CM

## 2024-03-28 DIAGNOSIS — Z136 Encounter for screening for cardiovascular disorders: Secondary | ICD-10-CM

## 2024-03-28 DIAGNOSIS — Z8249 Family history of ischemic heart disease and other diseases of the circulatory system: Secondary | ICD-10-CM

## 2024-03-28 DIAGNOSIS — I10 Essential (primary) hypertension: Secondary | ICD-10-CM

## 2024-03-28 DIAGNOSIS — J069 Acute upper respiratory infection, unspecified: Secondary | ICD-10-CM

## 2024-03-28 DIAGNOSIS — I4510 Unspecified right bundle-branch block: Secondary | ICD-10-CM

## 2024-03-28 DIAGNOSIS — Z87891 Personal history of nicotine dependence: Secondary | ICD-10-CM

## 2024-03-28 DIAGNOSIS — R0602 Shortness of breath: Secondary | ICD-10-CM

## 2024-03-28 DIAGNOSIS — R0609 Other forms of dyspnea: Secondary | ICD-10-CM

## 2024-03-28 MED ORDER — ALBUTEROL SULFATE HFA 108 (90 BASE) MCG/ACT IN AERS
2.0000 | INHALATION_SPRAY | Freq: Four times a day (QID) | RESPIRATORY_TRACT | 0 refills | Status: AC | PRN
Start: 2024-03-28 — End: ?

## 2024-03-28 MED ORDER — DOXYCYCLINE HYCLATE 100 MG PO TABS
100.0000 mg | ORAL_TABLET | Freq: Two times a day (BID) | ORAL | 0 refills | Status: AC
Start: 2024-03-28 — End: 2024-04-04

## 2024-03-28 NOTE — Assessment & Plan Note (Signed)
 History of essential hypertension with blood pressure measured this morning 142/76.  He was on losartan 50 mg which was up titrated to 100 mg.  Amlodipine was recently added.  Does have blood pressure cuff at home which she uses on a daily basis.  I have asked him to keep a 30 blood pressure log and we will arrange for him to see an APP and/or Pharm.D. back after that to review make appropriate changes in his medications.

## 2024-03-28 NOTE — Assessment & Plan Note (Signed)
 Chronic.

## 2024-03-28 NOTE — Progress Notes (Signed)
 Virtual Visit Consent   Gregg [name], you are scheduled for a virtual visit with a [facility] provider today. Just as with appointments in the office, your consent must be obtained to participate. Your consent will be active for this visit and any virtual visit you may have with one of our providers in the next 365 days. If you have a MyChart account, a copy of this consent can be sent to you electronically.  As this is a virtual visit, video technology does not allow for your provider to perform a traditional examination. This may limit your provider's ability to fully assess your condition. If your provider identifies any concerns that need to be evaluated in person or the need to arrange testing (such as labs, EKG, etc.), we will make arrangements to do so. Although advances in technology are sophisticated, we cannot ensure that it will always work on either your end or our end. If the connection with a video visit is poor, the visit may have to be switched to a telephone visit. With either a video or telephone visit, we are not always able to ensure that we have a secure connection.  By engaging in this virtual visit, you consent to the provision of healthcare and authorize for your insurance to be billed (if applicable) for the services provided during this visit. Depending on your insurance coverage, you may receive a charge related to this service.  I need to obtain your verbal consent now. Are you willing to proceed with your visit today? Gregg [name] has provided verbal consent on 03/28/2024 for a virtual visit (video or telephone). [name], FNP  Date: 03/28/2024 5:17 PM   Virtual Visit via Video Note   I, [name], connected with  Gregg [name]  ([id number], [birth year]) on 03/28/24 at  5:30 PM EDT by a video-enabled telemedicine application and verified that I am speaking with the correct person using two identifiers.  Location: Patient: Virtual Visit Location  Patient: Home Provider: Virtual Visit Location Provider: Home Office   I discussed the limitations of evaluation and management by telemedicine and the availability of in person appointments. The patient expressed understanding and agreed to proceed.    History of Present Illness: Gregg [name] is a 68 y.o. who identifies as a male who was assigned male at birth, and is being seen today for chest congestion  He was initially seen yesterday and treated for allergies over E-visit.  Three days ago he felt that he got hit by the pollen He had complete nasal congestion without relief - he started taking Allegra D that did help his sinus congestion.   Overnight he woke up with a wet cough  On production of mucous today he noted that his mucous was green and it was alarming.   He was at the cardiologist today and was noted to have a "rattle" in his chest per patient though note does not mention anything specific though diagnosis includes SOB  Denies any fever   Has also started tylenol this morning   He does get bronchitis around the same time each year  He currently uses Abreva nightly  Also has a rescue inhaler that he hasn't needed recently   Also uses Flonase daily   During a long phone conversation today he did feel more SOB with some wheezing    Problems:  Patient Active Problem List   Diagnosis Date Noted   Family history of heart disease 03/28/2024   Right bundle  branch block 03/28/2024   Dyspnea on exertion 03/28/2024   Atypical chest pain 03/28/2024   Prostate cancer (HCC) 06/28/2023   Genetic testing 05/28/2023   Malignant neoplasm of prostate (HCC) 05/18/2023   Status post total thyroidectomy 10/14/2022   Toxic multinodular goiter 10/02/2022   Adenomatous polyp of transverse colon 09/10/2022   Diverticulosis of colon without hemorrhage 09/10/2022   Grade II internal hemorrhoids 09/10/2022   Mild persistent asthma 07/10/2022   Former smoker 07/10/2022    Enlarged thyroid gland 01/13/2022   Symptomatic cholelithiasis 01/08/2022   S/P laparoscopic cholecystectomy 01/08/2022   Multinodular goiter 12/01/2021   Multiple thyroid nodules 12/01/2021   Hyperlipidemia 09/11/2021   Essential hypertension 07/22/2021   Anxiety and depression 07/02/2021   History of appendicitis 07/01/2021   Acute phlegmonous appendicitis s/p lap appendectomy 03/07/2021 03/07/2021   Kidney stone 03/07/2021   Bilateral inguinal hernia (BIH) s/p lap repair with mesh 06/06/2021 03/07/2021    Allergies:  Allergies  Allergen Reactions   Shellfish Allergy Anaphylaxis and Swelling   Medications:  Current Outpatient Medications:    amLODipine (NORVASC) 5 MG tablet, Take 1 tablet (5 mg total) by mouth daily., Disp: 90 tablet, Rfl: 0   atorvastatin (LIPITOR) 40 MG tablet, Take 1 tablet (40 mg total) by mouth daily., Disp: 90 tablet, Rfl: 0   fexofenadine (ALLEGRA) 180 MG tablet, Take 1 tablet (180 mg total) by mouth daily., Disp: 30 tablet, Rfl: 0   fluticasone (FLONASE) 50 MCG/ACT nasal spray, Place 2 sprays into both nostrils daily., Disp: 16 g, Rfl: 0   fluticasone furoate-vilanterol (BREO ELLIPTA) 100-25 MCG/ACT AEPB, Inhale 1 puff into the lungs daily., Disp: 60 each, Rfl: 2   levothyroxine (SYNTHROID) 112 MCG tablet, Take 1 tablet (112 mcg total) by mouth daily., Disp: 90 tablet, Rfl: 3   losartan (COZAAR) 100 MG tablet, Take 1 tablet (100 mg total) by mouth daily., Disp: 90 tablet, Rfl: 0   losartan (COZAAR) 50 MG tablet, Take 1 tablet (50 mg total) by mouth daily., Disp: 90 tablet, Rfl: 0   olopatadine (PATANOL) 0.1 % ophthalmic solution, Place 1 drop into both eyes 2 (two) times daily., Disp: 5 mL, Rfl: 0   pantoprazole (PROTONIX) 40 MG tablet, TAKE 1 TABLET BY MOUTH DAILY BEFORE BREAKFAST, Disp: 90 tablet, Rfl: 0   sertraline (ZOLOFT) 25 MG tablet, TAKE 1 TABLET (25 MG TOTAL) BY MOUTH DAILY., Disp: 90 tablet, Rfl: 0   tadalafil (CIALIS) 5 MG tablet, Take 5 mg by mouth  daily., Disp: , Rfl:   Observations/Objective: Patient is well-developed, well-nourished in no acute distress.  Resting comfortably  at home.  Head is normocephalic, atraumatic.  No labored breathing.  Speech is clear and coherent with logical content.  Patient is alert and oriented at baseline.    Assessment and Plan:  1. Bronchitis  Meds ordered this encounter  Medications   albuterol (VENTOLIN HFA) 108 (90 Base) MCG/ACT inhaler    Sig: Inhale 2 puffs into the lungs every 6 (six) hours as needed for wheezing or shortness of breath.    Dispense:  8 g    Refill:  0   doxycycline (VIBRA-TABS) 100 MG tablet    Sig: Take 1 tablet (100 mg total) by mouth 2 (two) times daily for 7 days.    Dispense:  14 tablet    Refill:  0     Continue allegra, flonase refill Albuterol for as needed  Advised Mucinex OTC without decongestant   Strict follow up precautions for new/worsening  symptoms    Follow Up Instructions: I discussed the assessment and treatment plan with the patient. The patient was provided an opportunity to ask questions and all were answered. The patient agreed with the plan and demonstrated an understanding of the instructions.  A copy of instructions were sent to the patient via MyChart unless otherwise noted below.    The patient was advised to call back or seek an in-person evaluation if the symptoms worsen or if the condition fails to improve as anticipated.    [name], FNP

## 2024-03-28 NOTE — Assessment & Plan Note (Signed)
 Discontinue tobacco abuse 7 years ago after having smoked 15 to 20 pack years.

## 2024-03-28 NOTE — Assessment & Plan Note (Signed)
 Father had bypass surgery at age 66.

## 2024-03-28 NOTE — Progress Notes (Signed)
 03/28/2024 Sabra Heck [facility]   06-28-1956  [id number]  Primary Physician Rema Fendt, NP Primary Cardiologist: Runell Gess MD Nicholes Calamity, [place]  HPI:  Gregg [name] is a 68 y.o. fit appearing single Caucasian male with no children referred to me by Ricky Stabs NP for evaluation of hypertension and atypical chest pain.  He has seen Dr. Carolan Clines in the past for palpitations.  Event monitor showed only benign PVCs.  He does have a history of hypothyroidism status post total thyroidectomy on Synthroid replacement therapy.  His risk factors otherwise include history of treated hypertension and hyperlipidemia.  He has 15-20-pack-year tobacco abuse having quit 7 years ago.  His father did have bypass surgery at age 83.  He is never had a heart attack or stroke.  He gets occasional atypical chest pain which is right-sided and fairly focal with some upper extremity numbness which sounds positional.  He also complains of episodic dyspnea on exertion.  He is fairly active and mows his lawn and weed eats.  His PCP recently increase his losartan and added amlodipine for elevated blood pressure.  He does have a blood pressure cuff at home which he uses on a daily basis.   No outpatient medications have been marked as taking for the 03/28/24 encounter (Office Visit) with Runell Gess, MD.     Allergies  Allergen Reactions   Shellfish Allergy Anaphylaxis and Swelling    Social History   Socioeconomic History   Marital status: Single    Spouse name: [name]   Number of children: 0   Years of education: Not on file   Highest education level: Bachelor's degree (e.g., BA, AB, BS)  Occupational History   Occupation: [employer]ing [employer]  Tobacco Use   Smoking status: Former    Current packs/day: 0.00    Average packs/day: 0.5 packs/day for 40.0 years (20.0 ttl pk-yrs)    Types: Cigarettes    Start date: 03/29/1977    Quit date: 03/29/2017    Years since  quitting: 7.0    Passive exposure: Past   Smokeless tobacco: Never  Vaping Use   Vaping status: Never Used  Substance and Sexual Activity   Alcohol use: Yes    Comment: rare   Drug use: Never   Sexual activity: Not on file  Other Topics Concern   Not on file  Social History Narrative   Not on file   Social Drivers of Health   Financial Resource Strain: Low Risk  (02/10/2024)   Overall Financial Resource Strain (CARDIA)    Difficulty of Paying Living Expenses: Not hard at all  Food Insecurity: No Food Insecurity (02/10/2024)   Hunger Vital Sign    Worried About Running Out of Food in the Last Year: Never true    Ran Out of Food in the Last Year: Never true  Transportation Needs: No Transportation Needs (02/10/2024)   PRAPARE - Administrator, Civil Service (Medical): No    Lack of Transportation (Non-Medical): No  Physical Activity: Insufficiently Active (02/10/2024)   Exercise Vital Sign    Days of Exercise per Week: 4 days    Minutes of Exercise per Session: 20 min  Stress: No Stress Concern Present (02/10/2024)   [employer] of Occupational Health - Occupational Stress Questionnaire    Feeling of Stress : Not at all  Social Connections: Moderately Integrated (02/10/2024)   Social Connection and Isolation Panel [NHANES]  Frequency of Communication with Friends and Family: More than three times a week    Frequency of Social Gatherings with Friends and Family: Twice a week    Attends Religious Services: More than 4 times per year    Active Member of [employer] or Organizations: Yes    Attends Engineer, structural: More than 4 times per year    Marital Status: Never married  Intimate Partner Violence: Not At Risk (06/28/2023)   Humiliation, Afraid, Rape, and Kick questionnaire    Fear of Current or Ex-Partner: No    Emotionally Abused: No    Physically Abused: No    Sexually Abused: No     Review of Systems: General: negative for chills, fever, night  sweats or weight changes.  Cardiovascular: negative for chest pain, dyspnea on exertion, edema, orthopnea, palpitations, paroxysmal nocturnal dyspnea or shortness of breath Dermatological: negative for rash Respiratory: negative for cough or wheezing Urologic: negative for hematuria Abdominal: negative for nausea, vomiting, diarrhea, bright red blood per rectum, melena, or hematemesis Neurologic: negative for visual changes, syncope, or dizziness All other systems reviewed and are otherwise negative except as noted above.    Blood pressure (!) 142/76, pulse 92, height 5\' 9"  (1.753 m), weight 186 lb 3.2 oz (84.5 kg), SpO2 96%.  General appearance: alert and no distress Neck: no adenopathy, no carotid bruit, no JVD, supple, symmetrical, trachea midline, and thyroid not enlarged, symmetric, no tenderness/mass/nodules Lungs: clear to auscultation bilaterally Heart: regular rate and rhythm, S1, S2 normal, no murmur, click, rub or gallop Extremities: extremities normal, atraumatic, no cyanosis or edema Pulses: 2+ and symmetric Skin: Skin color, texture, turgor normal. No rashes or lesions Neurologic: Grossly normal  EKG EKG Interpretation Date/Time:  Tuesday March 28 2024 08:17:33 EDT Ventricular Rate:  92 PR Interval:  142 QRS Duration:  130 QT Interval:  382 QTC Calculation: 472 R Axis:   -85  Text Interpretation: Normal sinus rhythm Right bundle branch block Left anterior fascicular block Bifascicular block Lateral infarct (cited on or before 28-Mar-2024) Inferior infarct , age undetermined When compared with ECG of 15-Jun-2023 08:45, Left anterior fascicular block is now Present Inferior infarct is now Present Confirmed by Nanetta Batty [phone]) on 03/28/2024 8:19:29 AM    ASSESSMENT AND PLAN:   Essential hypertension History of essential hypertension with blood pressure measured this morning 142/76.  He was on losartan 50 mg which was up titrated to 100 mg.  Amlodipine was recently  added.  Does have blood pressure cuff at home which she uses on a daily basis.  I have asked him to keep a 30 blood pressure log and we will arrange for him to see an APP and/or Pharm.D. back after that to review make appropriate changes in his medications.  Hyperlipidemia History of hyperlipidemia on statin therapy with lipid profile performed 10/26/2023 revealing total cholesterol 171, LDL 100 and HDL 41.  Former smoker Discontinue tobacco abuse 7 years ago after having smoked 15 to 20 pack years.  Family history of heart disease Father had bypass surgery at age 66.  Right bundle branch block Chronic  Dyspnea on exertion Patient complains of mild/episodic dyspnea on exertion.  He did have a moderately long history of tobacco abuse, quit 7 years ago.  I am going to get a 2D echo to further evaluate  Atypical chest pain Patient has a history of GERD.  He has had several episodes of "atypical chest pain with bilateral upper extremity numbness.  I am going to  get a coronary calcium score to stratify.     Runell Gess MD FACP,FACC,FAHA, [facility] 03/28/2024 8:40 AM

## 2024-03-28 NOTE — Assessment & Plan Note (Signed)
 Patient complains of mild/episodic dyspnea on exertion.  He did have a moderately long history of tobacco abuse, quit 7 years ago.  I am going to get a 2D echo to further evaluate

## 2024-03-28 NOTE — Assessment & Plan Note (Signed)
 History of hyperlipidemia on statin therapy with lipid profile performed 10/26/2023 revealing total cholesterol 171, LDL 100 and HDL 41.

## 2024-03-28 NOTE — Patient Instructions (Signed)
   Testing/Procedures:  CORONARY CALCIUM SCORING CT SCAN AT THE [facility]   Your physician has requested that you have an echocardiogram. Echocardiography is a painless test that uses sound waves to create images of your heart. It provides your doctor with information about the size and shape of your heart and how well your heart's chambers and valves are working. This procedure takes approximately one hour. There are no restrictions for this procedure. Please do NOT wear cologne, perfume, aftershave, or lotions (deodorant is allowed). Please arrive 15 minutes prior to your appointment time.  Please note: We ask at that you not bring children with you during ultrasound (echo/ vascular) testing. Due to room size and safety concerns, children are not allowed in the ultrasound rooms during exams. Our front office staff cannot provide observation of children in our lobby area while testing is being conducted. An adult accompanying a patient to their appointment will only be allowed in the ultrasound room at the discretion of the ultrasound technician under special circumstances. We apologize for any inconvenience. [facility]  Follow-Up: At [facility], you and your health needs are our priority.  As part of our continuing mission to provide you with exceptional heart care, our providers are all part of one team.  This team includes your primary Cardiologist (physician) and Advanced Practice Providers or APPs (Physician Assistants and Nurse Practitioners) who all work together to provide you with the care you need, when you need it.  Your next appointment:   5 week(s)  Provider:   Ilda Basset D FOR BP    Your physician recommends that you schedule a follow-up appointment in: 3 MONTHS WITH DR Allyson Sabal          1st Floor: - Lobby - Registration  - Pharmacy  - Lab - Cafe  2nd Floor: - PV Lab - Diagnostic Testing (echo, CT, nuclear med)  3rd Floor: - Vacant  4th  Floor: - TCTS (cardiothoracic surgery) - AFib Clinic - Structural Heart Clinic - Vascular Surgery  - Vascular Ultrasound  5th Floor: - [facility] (general and EP) - Clinical Pharmacy for coumadin, hypertension, lipid, weight-loss medications, and med management appointments    Valet parking services will be available as well.

## 2024-03-28 NOTE — Assessment & Plan Note (Signed)
 Patient has a history of GERD.  He has had several episodes of "atypical chest pain with bilateral upper extremity numbness.  I am going to get a coronary calcium score to stratify.

## 2024-03-28 NOTE — Progress Notes (Signed)
   Thank you for the details you included in the comment boxes. Those details are very helpful in determining the best course of treatment for you and help [place] to provide the best care.Because of concern about symptoms with e-visit within past 24-48 ours, we recommend that you schedule a Virtual Urgent Care video visit in order for the provider to better assess what is going on.  The provider will be able to give you a more accurate diagnosis and treatment plan if we can more freely discuss your symptoms and with the addition of a virtual examination.   If you change your visit to a video visit, we will bill your insurance (similar to an office visit) and you will not be charged for this e-Visit. You will be able to stay at home and speak with the first available [facility] advanced practice provider. The link to do a video visit is in the drop down Menu tab of your Welcome screen in MyChart.

## 2024-04-03 NOTE — Progress Notes (Addendum)
 Virtual Visit via Video Note  I connected with Gregg [name] on 04/04/24 at  7:50 AM EDT by a video enabled telemedicine application and verified that I am speaking with the correct person using two identifiers.   I discussed the limitations of evaluation and management by telemedicine and the availability of in person appointments. The patient expressed understanding and agreed to proceed.   -Location of the patient : home -Location of the provider : Office -The names of all persons participating in the telemedicine service : Pt and myself           Name: Gregg [name]  MRN/ DOB: [id number], 1956/06/07    Age/ Sex: 68 y.o., male     PCP: Senaida Dama, NP   Reason for Endocrinology Evaluation: Hyperthyroidism     Initial Endocrinology Clinic Visit: 12/01/2021    PATIENT IDENTIFIER: Gregg [name] is a 68 y.o., male with a past medical history of MNG, Hx of prostate cancer . He has followed with [facility] since 12/01/2021 for consultative assistance with management of his hyperthyroidism.   HISTORICAL SUMMARY: The patient was first diagnosed with  hyperthyroidism in 2022.   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   He was evaluated by cardiology for HTN, dyslipidemia, and dyspnea on exertion, has a pending echocardiogram and a calcium  score He continues to follow-up with urology for history of prostate cancer  He is currently having URI symptoms, on antibiotics, mild fever, improving Weight has been stable, he would like to lose  more weight Denies local neck swelling   He does endorse palpitations as well as restless leg Denies constipation or diarrhea  Denies tremors but has noted numbness of the upper extremities while sitting   Levothyroxine  112 mcg   HISTORY:  Past Medical History:  Past Medical History:  Diagnosis Date   Anxiety    Arthritis    Asthma    Bilateral inguinal hernia 06/02/2021   Cancer (HCC) 04/05/2023   Prostrate CA early   Chronic kidney disease    GERD (gastroesophageal reflux disease)    Hemorrhoids, internal    History of kidney stones 2020   Hyperlipidemia    Hypertension    Right BBB/    Sinus complaint 06/02/2021   x 1 week per pt   Thyroid  disease    Wears glasses 06/02/2021   Past Surgical History:  Past Surgical History:  Procedure Laterality Date   CHOLECYSTECTOMY     COLONOSCOPY WITH PROPOFOL  N/A 12/31/2021   Procedure: COLONOSCOPY WITH PROPOFOL ;  Surgeon: Annis Kinder, DO;  Location: [facility];  Service: Gastroenterology;  Laterality: N/A;   ENDOSCOPIC MUCOSAL RESECTION N/A 12/31/2021   Procedure: ENDOSCOPIC MUCOSAL RESECTION;  Surgeon: Annis Kinder, DO;  Location: [facility];  Service: Gastroenterology;  Laterality: N/A;   EXTRACORPOREAL SHOCK WAVE LITHOTRIPSY Left 02/23/2019   Procedure: EXTRACORPOREAL SHOCK WAVE LITHOTRIPSY (ESWL);  Surgeon: Erman Hayward, MD;  Location: [facility];  Service: Urology;  Laterality: Left;   HEMOSTASIS CLIP PLACEMENT  12/31/2021   Procedure: HEMOSTASIS CLIP PLACEMENT;  Surgeon: Cirigliano, Vito  V, DO;  Location: [facility];  Service: Gastroenterology;;   INGUINAL HERNIA REPAIR Bilateral 06/06/2021   Procedure: LAPAROSCOPIC BILATERAL INGUINAL HERNIA REPAIRS WITH MESH;  Surgeon: Candyce Champagne, MD;  Location: [facility];  Service: General;  Laterality: Bilateral;  TAP BLOCK   LAPAROSCOPIC APPENDECTOMY N/A 03/07/2021   Procedure: APPENDECTOMY LAPAROSCOPIC;  Surgeon: Candyce Champagne, MD;  Location: [facility];  Service: General;  Laterality: N/A;   LYMPHADENECTOMY Bilateral 06/28/2023   Procedure: BILATERAL PELVIC LYMPHADENECTOMY;  Surgeon: Florencio Hunting, MD;  Location: [facility];  Service: Urology;  Laterality: Bilateral;   PROSTATE BIOPSY     ROBOT ASSISTED LAPAROSCOPIC RADICAL PROSTATECTOMY N/A 06/28/2023   Procedure: XI ROBOTIC ASSISTED LAPAROSCOPIC RADICAL PROSTATECTOMY LEVEL 3;  Surgeon: Florencio Hunting, MD;  Location: [facility];  Service: Urology;  Laterality: N/A;  210 MINUTES NEEDED FOR CASE   SUBMUCOSAL LIFTING INJECTION  12/31/2021   Procedure: SUBMUCOSAL LIFTING INJECTION;  Surgeon: Annis Kinder, DO;  Location: [facility];  Service: Gastroenterology;;   THYROIDECTOMY N/A 10/02/2022   Procedure: TOTAL THYROIDECTOMY;  Surgeon: Oralee Billow, MD;  Location: [facility];  Service: General;  Laterality: N/A;   Social History:  reports that he quit smoking about 7 years ago. His smoking use included cigarettes. He started smoking about 47 years ago. He has a 20 pack-year smoking history. He has been exposed to tobacco smoke. He has never used smokeless tobacco. He reports current alcohol use. He reports that he does not use drugs. Family History:  Family History  Problem Relation Age of Onset   Thyroid  disease Mother    Breast cancer Mother 43 - 34   Thyroid  cancer Father 91   Thyroid  disease Sister    Prostate cancer Brother 26   Bone cancer Brother 50       agent orange exposure   Prostate cancer Paternal Uncle 15   Kidney cancer Paternal Grandfather 55 - 8   Stomach cancer Neg Hx    Colon cancer Neg Hx    Esophageal cancer Neg Hx    Rectal cancer Neg Hx    Colon polyps Neg Hx      HOME MEDICATIONS: Allergies as of 04/04/2024       Reactions   Shellfish Allergy Anaphylaxis, Swelling        Medication List        Accurate as of April 04, 2024  7:54 AM. If you have any questions, ask your nurse or doctor.          albuterol  108 (90 Base) MCG/ACT inhaler Commonly known  as: VENTOLIN  HFA Inhale 2 puffs into the lungs every 6 (six) hours as needed for wheezing or shortness of breath.   amLODipine  5 MG tablet Commonly known as: NORVASC  Take 1 tablet (5 mg total) by mouth daily.   atorvastatin  40 MG tablet Commonly known as: LIPITOR Take 1 tablet (40 mg total) by mouth daily.   doxycycline  100 MG tablet Commonly known as: VIBRA -TABS Take 1 tablet (100 mg total) by mouth 2 (two) times daily for 7 days.   fexofenadine  180 MG tablet Commonly known as: ALLEGRA  Take 1 tablet (180 mg total) by mouth daily.   fluticasone  50 MCG/ACT nasal spray Commonly known as: FLONASE  Place 2 sprays into both nostrils daily.   fluticasone  furoate-vilanterol 100-25 MCG/ACT Aepb Commonly known as: Breo Ellipta  Inhale 1 puff into the lungs daily.   levothyroxine  112 MCG tablet Commonly known as: SYNTHROID  Take 1 tablet (112 mcg total) by mouth daily.  losartan  50 MG tablet Commonly known as: COZAAR  Take 1 tablet (50 mg total) by mouth daily.   losartan  100 MG tablet Commonly known as: COZAAR  Take 1 tablet (100 mg total) by mouth daily.   olopatadine  0.1 % ophthalmic solution Commonly known as: PATANOL Place 1 drop into both eyes 2 (two) times daily.   pantoprazole  40 MG tablet Commonly known as: PROTONIX  TAKE 1 TABLET BY MOUTH DAILY BEFORE BREAKFAST   sertraline  25 MG tablet Commonly known as: ZOLOFT  TAKE 1 TABLET (25 MG TOTAL) BY MOUTH DAILY.   tadalafil 5 MG tablet Commonly known as: CIALIS Take 5 mg by mouth daily.          OBJECTIVE:   PHYSICAL EXAM: VS: There were no vitals taken for this visit.   EXAM: General: Pt appears well and is in NAD  Mental Status: Judgment, insight: Intact Orientation: Oriented to time, place, and person Mood and affect: No depression, anxiety, or agitation     DATA REVIEWED:   Latest Reference Range & Units 04/17/24 14:18  TSH 0.40 - 4.50 mIU/L 1.61  T4,Free(Direct) 0.8 - 1.8 ng/dL 1.5       Latest Reference Range & Units 03/14/24 09:46  Sodium 134 - 144 mmol/L 140  Potassium 3.5 - 5.2 mmol/L 4.4  Chloride 96 - 106 mmol/L 101  CO2 20 - 29 mmol/L 25  Glucose 70 - 99 mg/dL 97  BUN 8 - 27 mg/dL 13  Creatinine 4.09 - 8.11 mg/dL 9.14  Calcium  8.6 - 10.2 mg/dL 9.9  BUN/Creatinine Ratio 10 - 24  12  eGFR >59 mL/min/1.73 79      Thyroid  Pathology 10/02/2022   FINAL MICROSCOPIC DIAGNOSIS:   A. THYROID , TOTAL THYROIDECTOMY:  - ADENOMATOID NODULAR HYPERPLASIA (SEE COMMENT).  - NEGATIVE FOR MALIGNANCY.   ASSESSMENT / PLAN / RECOMMENDATIONS:   Postoperative Hypothyroidism  -Patient with multiple nonspecific symptoms -No local neck symptoms - TFTs remain within normal range, no change Medication   Continue levothyroxine  112 mcg daily  2.  Inability to lose the weight:  - Patient states that he has been monitoring salt and carbohydrate intake - Despite the fact that he stays active, the patient does not perform regular exercise - Patient attributes weight gain since thyroid  surgery - I did explain to the patient that he was in hyperthyroid state which increase his metabolism, but as of his latest labs, his TFTs are normal which indicate normal metabolism, and I have encouraged him to incorporate exercise 25 minutes up to 3 times a week  Follow-up in 1 year   Signed electronically by: Natale Bail, MD  [facility]  [facility] [address] [place], [place] [address] Phone: [phone] FAX: [phone]      CC: Senaida Dama, NP [address] [place] [address] Phone: [phone]  Fax: [phone]   Return to Endocrinology clinic as below: Future Appointments  Date Time Provider Department Center  04/17/2024  2:30 PM [facility]/NEURO LAB [facility] None  04/18/2024  8:00 AM [facility]-ECHO/VAS [facility]  05/04/2024 11:20 AM Carie Charity, NP [facility] None  05/16/2024  8:00 AM [facility]-CT 1  [facility]CT [facility]  06/27/2024  9:00 AM Avanell Leigh, MD [facility] None

## 2024-04-04 ENCOUNTER — Telehealth (INDEPENDENT_AMBULATORY_CARE_PROVIDER_SITE_OTHER): Payer: HMO | Admitting: Internal Medicine

## 2024-04-04 DIAGNOSIS — E890 Postprocedural hypothyroidism: Secondary | ICD-10-CM | POA: Diagnosis not present

## 2024-04-17 ENCOUNTER — Other Ambulatory Visit

## 2024-04-18 ENCOUNTER — Other Ambulatory Visit (HOSPITAL_BASED_OUTPATIENT_CLINIC_OR_DEPARTMENT_OTHER)

## 2024-04-18 ENCOUNTER — Ambulatory Visit: Admitting: Family

## 2024-04-18 ENCOUNTER — Encounter: Payer: Self-pay | Admitting: Internal Medicine

## 2024-04-18 DIAGNOSIS — R0602 Shortness of breath: Secondary | ICD-10-CM | POA: Diagnosis not present

## 2024-04-18 LAB — T4, FREE: Free T4: 1.5 ng/dL (ref 0.8–1.8)

## 2024-04-18 LAB — ECHOCARDIOGRAM COMPLETE
Area-P 1/2: 4.31 cm2
S' Lateral: 2.71 cm

## 2024-04-18 LAB — TSH: TSH: 1.61 m[IU]/L (ref 0.40–4.50)

## 2024-04-18 MED ORDER — LEVOTHYROXINE SODIUM 112 MCG PO TABS: 112.0000 ug | ORAL_TABLET | Freq: Every day | ORAL | 3 refills | Status: AC

## 2024-04-18 NOTE — Addendum Note (Signed)
 Addended by: Valentina Gasman on: 04/18/2024 08:25 AM   Modules accepted: Orders

## 2024-04-19 ENCOUNTER — Other Ambulatory Visit: Payer: Self-pay | Admitting: Family

## 2024-04-19 DIAGNOSIS — E785 Hyperlipidemia, unspecified: Secondary | ICD-10-CM

## 2024-04-19 NOTE — Telephone Encounter (Signed)
 Complete

## 2024-04-24 ENCOUNTER — Other Ambulatory Visit: Payer: Self-pay | Admitting: Nurse Practitioner

## 2024-04-24 DIAGNOSIS — J40 Bronchitis, not specified as acute or chronic: Secondary | ICD-10-CM

## 2024-04-28 ENCOUNTER — Telehealth: Admitting: Physician Assistant

## 2024-04-28 DIAGNOSIS — B9689 Other specified bacterial agents as the cause of diseases classified elsewhere: Secondary | ICD-10-CM | POA: Diagnosis not present

## 2024-04-28 DIAGNOSIS — J988 Other specified respiratory disorders: Secondary | ICD-10-CM | POA: Diagnosis not present

## 2024-04-28 MED ORDER — AZITHROMYCIN 250 MG PO TABS
ORAL_TABLET | ORAL | 0 refills | Status: AC
Start: 2024-04-28 — End: 2024-05-03

## 2024-04-28 NOTE — Progress Notes (Signed)
 E-Visit for Cough  We are sorry that you are not feeling well.  Here is how we plan to help!  Based on your presentation I believe you most likely have A cough due to bacteria.  When patients have a fever and a productive cough with a change in color or increased sputum production, we are concerned about bacterial bronchitis.  If left untreated it can progress to pneumonia.  If your symptoms do not improve with your treatment plan it is important that you contact your provider.   I have prescribed Azithromyin 250 mg: two tablets now and then one tablet daily for 4 additonal days    In addition you may use A non-prescription cough medication called Mucinex DM: take 2 tablets every 12 hours.  From your responses in the eVisit questionnaire you describe inflammation in the upper respiratory tract which is causing a significant cough.  This is commonly called Bronchitis and has four common causes:   Allergies Viral Infections Acid Reflux Bacterial Infection Allergies, viruses and acid reflux are treated by controlling symptoms or eliminating the cause. An example might be a cough caused by taking certain blood pressure medications. You stop the cough by changing the medication. Another example might be a cough caused by acid reflux. Controlling the reflux helps control the cough.  USE OF BRONCHODILATOR ("RESCUE") INHALERS: There is a risk from using your bronchodilator too frequently.  The risk is that over-reliance on a medication which only relaxes the muscles surrounding the breathing tubes can reduce the effectiveness of medications prescribed to reduce swelling and congestion of the tubes themselves.  Although you feel brief relief from the bronchodilator inhaler, your asthma may actually be worsening with the tubes becoming more swollen and filled with mucus.  This can delay other crucial treatments, such as oral steroid medications. If you need to use a bronchodilator inhaler daily, several times  per day, you should discuss this with your provider.  There are probably better treatments that could be used to keep your asthma under control.     HOME CARE Only take medications as instructed by your medical team. Complete the entire course of an antibiotic. Drink plenty of fluids and get plenty of rest. Avoid close contacts especially the very young and the elderly Cover your mouth if you cough or cough into your sleeve. Always remember to wash your hands A steam or ultrasonic humidifier can help congestion.   GET HELP RIGHT AWAY IF: You develop worsening fever. You become short of breath You cough up blood. Your symptoms persist after you have completed your treatment plan MAKE SURE YOU  Understand these instructions. Will watch your condition. Will get help right away if you are not doing well or get worse.    Thank you for choosing an e-visit.  Your e-visit answers were reviewed by a board certified advanced clinical practitioner to complete your personal care plan. Depending upon the condition, your plan could have included both over the counter or prescription medications.  Please review your pharmacy choice. Make sure the pharmacy is open so you can pick up prescription now. If there is a problem, you may contact your provider through [employer] and have the prescription routed to another pharmacy.  Your safety is important to [place]. If you have drug allergies check your prescription carefully.   For the next 24 hours you can use MyChart to ask questions about today's visit, request a non-urgent call back, or ask for a work or school  excuse. You will get an email in the next two days asking about your experience. I hope that your e-visit has been valuable and will speed your recovery.  I have spent 5 minutes in review of e-visit questionnaire, review and updating patient chart, medical decision making and response to patient.   Margaretann Loveless, PA-C

## 2024-05-04 ENCOUNTER — Ambulatory Visit: Attending: General Practice | Admitting: Cardiology

## 2024-05-04 ENCOUNTER — Encounter: Payer: Self-pay | Admitting: General Practice

## 2024-05-04 VITALS — BP 124/80 | HR 82 | Ht 69.0 in | Wt 182.0 lb

## 2024-05-04 DIAGNOSIS — R0789 Other chest pain: Secondary | ICD-10-CM

## 2024-05-04 DIAGNOSIS — E782 Mixed hyperlipidemia: Secondary | ICD-10-CM

## 2024-05-04 DIAGNOSIS — I10 Essential (primary) hypertension: Secondary | ICD-10-CM

## 2024-05-04 NOTE — Progress Notes (Signed)
 Cardiology Office Note:  .   Date:  05/04/2024  ID:  Gregg [name], DOB March 06, 1956, MRN [id number] PCP: Senaida Dama, NP  [facility] Providers Cardiologist:  Lauro Portal, MD {  History of Present Illness: .   Gregg [name] is a 68 y.o. male with history of hypertension, hyperlipidemia, RBBB, hypothyroidism status post thyroidectomy 2023, previous smoker (40+ years, quit 2018), prostate cancer s/p preostectomy  .  Father had CABG at 63  Patient needs to be with Dr. Amanda Jungling now transitioned to Dr. Katheryne Pane and last seen April 2025.  We have been following him for hypertension, palpitations, atypical chest pain.  Previous heart monitor in May 2023 showing minimal PVCs less than 1% burden.  Coronary CTA ordered at last visit but has not yet to be completed.  Due to DOE echocardiogram was ordered that showed preserved biventricular function with no significant valvular disease.  Today patient presents for follow-up, his coronary CT still pending but will be completed in the next 2 weeks.  He reports very sparse and random episodes of shortness of breath or feeling winded but really no complaints of chest pain.  For example he was out mowing the lawn all day yesterday with a push mower and had absolutely no complaints however for some reason the day before that he was sitting outside in suddenly became winded.  Also notes that he has been treated for chronic bronchitis and just completed course of antibiotics.  Additionally when he said he had chest pain in the past he felt it was only when he was lying flat on his back, pointed to focal burning pain on his right chest.  Otherwise no exertional symptoms.   ROS: Denies: Chest pain, shortness of breath, orthopnea, peripheral edema, palpitations, decreased exercise intolerance, fatigue, lightheadedness.   Studies Reviewed: .    Echocardiogram 04/18/2024  1. Left ventricular ejection fraction, by estimation, is 60 to 65%. Left   ventricular ejection fraction by 3D volume is 58 %. The left ventricle has  normal function. The left ventricle has no regional wall motion  abnormalities. Left ventricular diastolic   parameters are consistent with Grade I diastolic dysfunction (impaired  relaxation). The average left ventricular global longitudinal strain is  -21.2 %. The global longitudinal strain is normal.   2. Right ventricular systolic function is normal. The right ventricular  size is normal. Tricuspid regurgitation signal is inadequate for assessing  PA pressure.   3. The mitral valve is normal in structure. Trivial mitral valve  regurgitation. No evidence of mitral stenosis.   4. The aortic valve is normal in structure. Aortic valve regurgitation is  not visualized. No aortic stenosis is present.   5. The inferior vena cava is normal in size with greater than 50%  respiratory variability, suggesting right atrial pressure of 3 mmHg.   Heart monitor 05/04/2022  Triggered events in the setting of minimal PVCs, sinus rhythm, sinus tachycardia. No significant tachyarrhythmia or bradyarrhythmia. No atrial fibrillation or flutter.     Patch Wear Time:  6 days and 13 hours (2023-05-01T17:24:30-0400 to 2023-05-08T07:21:31-0400)   Patient had a min HR of 62 bpm, max HR of 128 bpm, and avg HR of 87 bpm. Predominant underlying rhythm was Sinus Rhythm. Isolated SVEs were rare (<1.0%), SVE Couplets were rare (<1.0%), and SVE Triplets were rare (<1.0%). Isolated VEs were rare (<1.0%),  VE Couplets were rare (<1.0%), and no VE Triplets were present. Ventricular Bigeminy was present.   Risk Assessment/Calculations:  Physical Exam:   VS:  BP 124/80 (Cuff Size: Normal)   Pulse 82   Ht 5\' 9"  (1.753 m)   Wt 182 lb (82.6 kg)   SpO2 91%   BMI 26.88 kg/m    Wt Readings from Last 3 Encounters:  05/04/24 182 lb (82.6 kg)  03/28/24 186 lb 3.2 oz (84.5 kg)  03/14/24 186 lb 6.4 oz (84.6 kg)    GEN: Well nourished,  well developed in no acute distress NECK: No JVD; No carotid bruits CARDIAC: RRR, no murmurs, rubs, gallops RESPIRATORY:  Clear to auscultation without rales, wheezing or rhonchi  ABDOMEN: Soft, non-tender, non-distended EXTREMITIES:  No edema; No deformity   ASSESSMENT AND PLAN: .    Atypical chest pain He is complaining of right sided, focal, nonexertional chest pain that rarely/randomly occurs now.  Suspect GERD although he does not think this is related.  He does have significant family history of a father who had CABG in his 4s so coronary CT is pending.  This will be completed in 2 weeks.   Coronary CT pending  Palpitations Very low PVC burden less than 1% on heart monitor May 2023.  This is no longer an issue.  Hyperlipidemia LDL 100.  Continue with atorvastatin  40 mg.  After coronary CT we can decide if we need to be more aggressive.  Hypertension BP 124/80.  Very well-controlled. Continue with amlodipine  5 mg, losartan  100 mg.       Dispo: He will see Dr. Katheryne Pane in July 2025, follow-up on coronary CT and chest pain.  Signed, Burnetta Cart, PA-C

## 2024-05-04 NOTE — Patient Instructions (Signed)
 Medication Instructions:  The current medical regimen is effective;  continue present plan and medications as directed. Please refer to the Current Medication list given to you today.  *If you need a refill on your cardiac medications before your next appointment, please call your pharmacy*  Lab Work: NONE  Testing/Procedures: NONE  Follow-Up: At [facility], you and your health needs are our priority.  As part of our continuing mission to provide you with exceptional heart care, our providers are all part of one team.  This team includes your primary Cardiologist (physician) and Advanced Practice Providers or APPs (Physician Assistants and Nurse Practitioners) who all work together to provide you with the care you need, when you need it.  Your next appointment:   AS SCHEDULED   Provider:   Lauro Portal, MD

## 2024-05-12 ENCOUNTER — Other Ambulatory Visit: Payer: Self-pay | Admitting: Family

## 2024-05-12 DIAGNOSIS — I10 Essential (primary) hypertension: Secondary | ICD-10-CM

## 2024-05-16 ENCOUNTER — Ambulatory Visit (HOSPITAL_BASED_OUTPATIENT_CLINIC_OR_DEPARTMENT_OTHER)
Admission: RE | Admit: 2024-05-16 | Discharge: 2024-05-16 | Disposition: A | Discharge status: Home / Self Care | Payer: Self-pay | Source: Ambulatory Visit | Attending: Cardiovascular Disease | Admitting: Cardiovascular Disease

## 2024-05-16 ENCOUNTER — Ambulatory Visit: Payer: Self-pay | Admitting: Cardiology

## 2024-05-16 DIAGNOSIS — Z136 Encounter for screening for cardiovascular disorders: Secondary | ICD-10-CM | POA: Insufficient documentation

## 2024-05-16 NOTE — Progress Notes (Signed)
 Let the patient know that coronary calcium  score is markedly elevated and he will need extensive and aggressive risk modification.  Dr. Katheryne Pane will discuss more upon his return.  Coronary calcium  score 05/16/2024: Coronary calcium  score of 655. This was 87 percentile for age-, race-, and sex-matched controls. LM: 18.3 LAD: 427 LCx: 124 RCA: 86.5. Ascending and descending thoracic aorta measurements appear normal.

## 2024-06-08 ENCOUNTER — Encounter: Payer: Self-pay | Admitting: Family

## 2024-06-08 ENCOUNTER — Other Ambulatory Visit: Payer: Self-pay | Admitting: Family

## 2024-06-08 ENCOUNTER — Other Ambulatory Visit: Payer: Self-pay | Admitting: Family Medicine

## 2024-06-08 ENCOUNTER — Other Ambulatory Visit (HOSPITAL_COMMUNITY): Payer: Self-pay | Admitting: Urology

## 2024-06-08 DIAGNOSIS — C61 Malignant neoplasm of prostate: Secondary | ICD-10-CM

## 2024-06-08 DIAGNOSIS — I10 Essential (primary) hypertension: Secondary | ICD-10-CM

## 2024-06-08 DIAGNOSIS — F32A Depression, unspecified: Secondary | ICD-10-CM

## 2024-06-08 DIAGNOSIS — F419 Anxiety disorder, unspecified: Secondary | ICD-10-CM

## 2024-06-08 MED ORDER — AMLODIPINE BESYLATE 5 MG PO TABS: 5.0000 mg | ORAL_TABLET | Freq: Every day | ORAL | 0 refills | Status: DC

## 2024-06-20 ENCOUNTER — Encounter (HOSPITAL_COMMUNITY)
Admission: RE | Admit: 2024-06-20 | Discharge: 2024-06-20 | Disposition: A | Discharge status: Home / Self Care | Source: Ambulatory Visit | Attending: Urology | Admitting: Urology

## 2024-06-20 DIAGNOSIS — C61 Malignant neoplasm of prostate: Secondary | ICD-10-CM | POA: Insufficient documentation

## 2024-06-20 MED ORDER — FLOTUFOLASTAT F 18 GALLIUM 296-5846 MBQ/ML IV SOLN
8.1000 | Freq: Once | INTRAVENOUS | Status: AC
  Administered 2024-06-20: 8.1 via INTRAVENOUS

## 2024-06-27 ENCOUNTER — Ambulatory Visit: Attending: Cardiovascular Disease | Admitting: Cardiovascular Disease

## 2024-06-27 ENCOUNTER — Encounter: Payer: Self-pay | Admitting: Cardiovascular Disease

## 2024-06-27 VITALS — BP 130/80 | HR 72 | Ht 69.0 in | Wt 179.2 lb

## 2024-06-27 DIAGNOSIS — R0609 Other forms of dyspnea: Secondary | ICD-10-CM

## 2024-06-27 DIAGNOSIS — E785 Hyperlipidemia, unspecified: Secondary | ICD-10-CM | POA: Diagnosis not present

## 2024-06-27 DIAGNOSIS — I4510 Unspecified right bundle-branch block: Secondary | ICD-10-CM

## 2024-06-27 DIAGNOSIS — R0602 Shortness of breath: Secondary | ICD-10-CM

## 2024-06-27 DIAGNOSIS — Z87891 Personal history of nicotine dependence: Secondary | ICD-10-CM

## 2024-06-27 MED ORDER — ATORVASTATIN CALCIUM 80 MG PO TABS: 80.0000 mg | ORAL_TABLET | Freq: Every day | ORAL | 3 refills | Status: AC

## 2024-06-27 NOTE — Assessment & Plan Note (Signed)
 History of hyperlipidemia on atorvastatin  with lipid profile performed 10/26/2023 revealing total cholesterol 171, LDL 100 and HDL 41.  Given his elevated coronary calcium  score above 600 his LDL goal should be less than 70.  I am going to increase his atorvastatin  from 40 to 80 mg and we will recheck a lipid and liver profile in 3 months.  If he is not at goal he may be a candidate for a PCSK9.

## 2024-06-27 NOTE — Patient Instructions (Addendum)
 Medication Instructions:   INCREASE ATORVASTATIN  (Lipitor) TO 80 MG ONCE DAILY= 2 OF THE 40 MG TABLETS ONCE DAILY  *If you need a refill on your cardiac medications before your next appointment, please call your pharmacy*  Lab Work:  Your physician recommends that you return for lab work in: 3 MONTHS-FASTING  If you have labs (blood work) drawn today and your tests are completely normal, you will receive your results only by: MyChart Message (if you have MyChart) OR A paper copy in the mail If you have any lab test that is abnormal or we need to change your treatment, we will call you to review the results.  Testing/Procedures: See below   Follow-Up: At [facility], you and your health needs are our priority.  As part of our continuing mission to provide you with exceptional heart care, our providers are all part of one team.  This team includes your primary Cardiologist (physician) and Advanced Practice Providers or APPs (Physician Assistants and Nurse Practitioners) who all work together to provide you with the care you need, when you need it.  Your next appointment:   December  Provider:   Dorn Lesches, MD    We recommend signing up for the patient portal called MyChart.  Sign up information is provided on this After Visit Summary.  MyChart is used to connect with patients for Virtual Visits (Telemedicine).  Patients are able to view lab/test results, encounter notes, upcoming appointments, etc.  Non-urgent messages can be sent to your provider as well.   To learn more about what you can do with MyChart, go to [email or web address].   Other Instructions    Please report to Radiology at the [facility] Main Entrance 30 minutes early for your test.  [address] [place], [place] [address]   How to Prepare for Your Cardiac PET/CT Stress Test:  Nothing to eat or drink, except water , 3 hours prior to arrival time.  NO caffeine/decaffeinated  products, or chocolate 12 hours prior to arrival. (Please note decaffeinated beverages (teas/coffees) still contain caffeine).  If you have caffeine within 12 hours prior, the test will need to be rescheduled.  Medication instructions: Do not take erectile dysfunction medications for 72 hours prior to test (sildenafil, tadalafil) Do not take nitrates (isosorbide mononitrate, Ranexa) the day before or day of test Do not take tamsulosin  the day before or morning of test Hold theophylline containing medications for 12 hours. Hold Dipyridamole 48 hours prior to the test.  Diabetic Preparation: If able to eat breakfast prior to 3 hour fasting, you may take all medications, including your insulin. Do not worry if you miss your breakfast dose of insulin - start at your next meal. If you do not eat prior to 3 hour fast-Hold all diabetes (oral and insulin) medications. Patients who wear a continuous glucose monitor MUST remove the device prior to scanning.  You may take your remaining medications with water .  NO perfume, cologne or lotion on chest or abdomen area. FEMALES - Please avoid wearing dresses to this appointment.  Total time is 1 to 2 hours; you may want to bring reading material for the waiting time.  IF YOU THINK YOU MAY BE PREGNANT, OR ARE NURSING PLEASE INFORM THE TECHNOLOGIST.  In preparation for your appointment, medication and supplies will be purchased.  Appointment availability is limited, so if you need to cancel or reschedule, please call the Radiology Department Scheduler at [phone] 24 hours in advance to avoid a  cancellation fee of $100.00  What to Expect When you Arrive:  Once you arrive and check in for your appointment, you will be taken to a preparation room within the Radiology Department.  A technologist or Nurse will obtain your medical history, verify that you are correctly prepped for the exam, and explain the procedure.  Afterwards, an IV will be started in  your arm and electrodes will be placed on your skin for EKG monitoring during the stress portion of the exam. Then you will be escorted to the PET/CT scanner.  There, staff will get you positioned on the scanner and obtain a blood pressure and EKG.  During the exam, you will continue to be connected to the EKG and blood pressure machines.  A small, safe amount of a radioactive tracer will be injected in your IV to obtain a series of pictures of your heart along with an injection of a stress agent.    After your Exam:  It is recommended that you eat a meal and drink a caffeinated beverage to counter act any effects of the stress agent.  Drink plenty of fluids for the remainder of the day and urinate frequently for the first couple of hours after the exam.  Your doctor will inform you of your test results within 7-10 business days.  For more information and frequently asked questions, please visit our website: [email or web address]  For questions about your test or how to prepare for your test, please call: Cardiac Imaging Nurse Navigators Office: [phone]

## 2024-06-27 NOTE — Assessment & Plan Note (Signed)
 Family history of heart disease with father who had bypass surgery at the age 66.

## 2024-06-27 NOTE — Assessment & Plan Note (Signed)
 Prior history of tobacco abuse having quit over 7 years ago.

## 2024-06-27 NOTE — Assessment & Plan Note (Signed)
 History of dyspnea on exertion new over the last 1 to 2 years.  Does have a history of remote tobacco abuse.  He had a 2D echo performed 04/18/2024 revealing normal LV systolic function, grade 1 diastolic dysfunction without significant valvular abnormalities.  He also had a coronary calcium  score performed at the same time which was 655 distributed in all 3 coronary arteries the majority of which was in the LAD.  He thought denies chest pain.  I am going to get a cardiac PET study to rule out an ischemic etiology.

## 2024-06-27 NOTE — Progress Notes (Signed)
 06/27/2024 [name]   [birth year]  [id number]  Primary Physician Lorren Greig PARAS, NP Primary Cardiologist: Dorn PARAS Lesches MD [facility] [place], [place]  HPI:  Gregg [name] is a 69 y.o.   fit appearing single Caucasian male with no children referred to me by Greig Lorren NP for evaluation of hypertension and atypical chest pain.  I last saw him in the office 03/28/2024.  He has seen Dr. Ronal Ross in the past for palpitations.  Event monitor showed only benign PVCs.  He does have a history of hypothyroidism status post total thyroidectomy on Synthroid  replacement therapy.  His risk factors otherwise include history of treated hypertension and hyperlipidemia.  He has 15-20-pack-year tobacco abuse having quit 7 years ago.  His father did have bypass surgery at age 62.  He is never had a heart attack or stroke.  He gets occasional atypical chest pain which is right-sided and fairly focal with some upper extremity numbness which sounds positional.  He also complains of episodic dyspnea on exertion.  He is fairly active and mows his lawn and weed eats.  His PCP recently increase his losartan  and added amlodipine  for elevated blood pressure.  He does have a blood pressure cuff at home which he uses on a daily basis.  Since I saw him 3 months ago he did have a 2D echo performed 04/18/2024 that revealed normal LV systolic function, grade 1 diastolic dysfunction without valvular abnormalities.  A coronary calcium  score performed 05/16/2024 was 655 distributed in all 3 coronary arteries the majority of which was in the LAD.   Current Meds  Medication Sig   albuterol  (VENTOLIN  HFA) 108 (90 Base) MCG/ACT inhaler Inhale 2 puffs into the lungs every 6 (six) hours as needed for wheezing or shortness of breath.   amLODipine  (NORVASC ) 5 MG tablet Take 1 tablet (5 mg total) by mouth daily.   fluticasone  (FLONASE ) 50 MCG/ACT nasal spray Place 2 sprays into both nostrils daily.   fluticasone   furoate-vilanterol (BREO ELLIPTA ) 100-25 MCG/ACT AEPB Inhale 1 puff into the lungs daily.   levothyroxine  (SYNTHROID ) 112 MCG tablet Take 1 tablet (112 mcg total) by mouth daily.   losartan  (COZAAR ) 100 MG tablet TAKE 1 TABLET BY MOUTH EVERY DAY   pantoprazole  (PROTONIX ) 40 MG tablet TAKE 1 TABLET BY MOUTH DAILY BEFORE BREAKFAST   sertraline  (ZOLOFT ) 25 MG tablet TAKE 1 TABLET (25 MG TOTAL) BY MOUTH DAILY.   tadalafil (CIALIS) 5 MG tablet Take 5 mg by mouth daily.   [DISCONTINUED] atorvastatin  (LIPITOR) 40 MG tablet TAKE 1 TABLET BY MOUTH EVERY DAY     Allergies  Allergen Reactions   Shellfish Allergy Anaphylaxis and Swelling    Social History   Socioeconomic History   Marital status: Single    Spouse name: [name]   Number of children: 0   Years of education: Not on file   Highest education level: Bachelor's degree (e.g., BA, AB, BS)  Occupational History   Occupation: [employer]ing [employer]  Tobacco Use   Smoking status: Former    Current packs/day: 0.00    Average packs/day: 0.5 packs/day for 40.0 years (20.0 ttl pk-yrs)    Types: Cigarettes    Start date: 03/29/1977    Quit date: 03/29/2017    Years since quitting: 7.2    Passive exposure: Past   Smokeless tobacco: Never  Vaping Use   Vaping status: Never Used  Substance and Sexual Activity   Alcohol use: Yes  Comment: rare   Drug use: Never   Sexual activity: Not on file  Other Topics Concern   Not on file  Social History Narrative   Not on file   Social Drivers of Health   Financial Resource Strain: Low Risk  (02/10/2024)   Overall Financial Resource Strain (CARDIA)    Difficulty of Paying Living Expenses: Not hard at all  Food Insecurity: No Food Insecurity (02/10/2024)   Hunger Vital Sign    Worried About Running Out of Food in the Last Year: Never true    Ran Out of Food in the Last Year: Never true  Transportation Needs: No Transportation Needs (02/10/2024)   PRAPARE - Scientist, research (physical sciences) (Medical): No    Lack of Transportation (Non-Medical): No  Physical Activity: Insufficiently Active (02/10/2024)   Exercise Vital Sign    Days of Exercise per Week: 4 days    Minutes of Exercise per Session: 20 min  Stress: No Stress Concern Present (02/10/2024)   [employer] of Occupational Health - Occupational Stress Questionnaire    Feeling of Stress : Not at all  Social Connections: Moderately Integrated (02/10/2024)   Social Connection and Isolation Panel    Frequency of Communication with Friends and Family: More than three times a week    Frequency of Social Gatherings with Friends and Family: Twice a week    Attends Religious Services: More than 4 times per year    Active Member of [employer] or Organizations: Yes    Attends Engineer, structural: More than 4 times per year    Marital Status: Never married  Intimate Partner Violence: Not At Risk (06/28/2023)   Humiliation, Afraid, Rape, and Kick questionnaire    Fear of Current or Ex-Partner: No    Emotionally Abused: No    Physically Abused: No    Sexually Abused: No     Review of Systems: General: negative for chills, fever, night sweats or weight changes.  Cardiovascular: negative for chest pain, dyspnea on exertion, edema, orthopnea, palpitations, paroxysmal nocturnal dyspnea or shortness of breath Dermatological: negative for rash Respiratory: negative for cough or wheezing Urologic: negative for hematuria Abdominal: negative for nausea, vomiting, diarrhea, bright red blood per rectum, melena, or hematemesis Neurologic: negative for visual changes, syncope, or dizziness All other systems reviewed and are otherwise negative except as noted above.    Blood pressure 130/80, pulse 72, height 5' 9 (1.753 m), weight 179 lb 3.2 oz (81.3 kg), SpO2 97%.  General appearance: alert and no distress Neck: no adenopathy, no carotid bruit, no JVD, supple, symmetrical, trachea midline, and thyroid  not  enlarged, symmetric, no tenderness/mass/nodules Lungs: clear to auscultation bilaterally Heart: regular rate and rhythm, S1, S2 normal, no murmur, click, rub or gallop Extremities: extremities normal, atraumatic, no cyanosis or edema Pulses: 2+ and symmetric Skin: Skin color, texture, turgor normal. No rashes or lesions Neurologic: Grossly normal  EKG      ASSESSMENT AND PLAN:   Hyperlipidemia History of hyperlipidemia on atorvastatin  with lipid profile performed 10/26/2023 revealing total cholesterol 171, LDL 100 and HDL 41.  Given his elevated coronary calcium  score above 600 his LDL goal should be less than 70.  I am going to increase his atorvastatin  from 40 to 80 mg and we will recheck a lipid and liver profile in 3 months.  If he is not at goal he may be a candidate for a PCSK9.  Former smoker Prior history of tobacco abuse having quit over  7 years ago.  Family history of heart disease Family history of heart disease with father who had bypass surgery at the age 33.  Right bundle branch block Chronic  Dyspnea on exertion History of dyspnea on exertion new over the last 1 to 2 years.  Does have a history of remote tobacco abuse.  He had a 2D echo performed 04/18/2024 revealing normal LV systolic function, grade 1 diastolic dysfunction without significant valvular abnormalities.  He also had a coronary calcium  score performed at the same time which was 655 distributed in all 3 coronary arteries the majority of which was in the LAD.  He thought denies chest pain.  I am going to get a cardiac PET study to rule out an ischemic etiology.     Dorn DOROTHA Lesches MD FACP,FACC,FAHA, [facility] 06/27/2024 9:38 AM

## 2024-06-27 NOTE — Assessment & Plan Note (Signed)
 Chronic

## 2024-07-03 ENCOUNTER — Telehealth: Payer: Self-pay

## 2024-07-03 NOTE — Telephone Encounter (Signed)
 I called pt to introduce myself as  the Coordinator of the Prostate [facility].   1. I confirmed with the patient he is aware of his referral to the clinic 7/22, arriving @ 12:30 pm.    2. I discussed the format of the clinic and the physicians he will be seeing that day.   3. I discussed where the clinic is located and how to contact me.   4. I confirmed his address and informed him I would be mailing a packet of information and forms to be completed. I asked him to bring them with him the day of his appointment.    He voiced understanding of the above. I asked him to call me if he has any questions or concerns regarding his appointments or the forms he needs to complete.  Patient did mention he came to same clinic last year advised him I just needed the 2 pieces of paper on the back since I have all his other information scanned in

## 2024-07-10 NOTE — Progress Notes (Addendum)
                               Care Plan Summary  Name: Gregg [name]  DOB: 08-Jun-1956   Your Medical Team:   Urologist -  Dr. Gretel Ferrara, [facility]  Radiation Oncologist - Dr. Donnice Barge, [facility]   Medical Oncologist - Dr. Pauletta Chihuahua, [facility]  Recommendations: 1) Hormonal Therapy  2) Radiation (Will refer you to Dr. Jomarie in [place])   * These recommendations are based on information available as of today's consult.      Recommendations may change depending on the results of further tests or exams.    Next Steps: 1) [facility] will contact you with a date to start your hormonal therapy.   2) You will then be scheduled for a CT Simulation at the [facility] to plan for your radiation.   When appointments need to be scheduled, you will be contacted by [facility] and/or [facility].  Questions?  Please do not hesitate to call Vertell Pont, BSN, RN at [phone] with any questions or concerns.  Vertell is your Oncology Nurse Navigator and is available to assist you while you're receiving your medical care at [facility].

## 2024-07-10 NOTE — Progress Notes (Signed)
 [facility] CONSULT NOTE  Patient Care Team: Lorren Greig PARAS, NP as PCP - General (Nurse Practitioner) Court Dorn PARAS, MD as PCP - Cardiology (Cardiology) Sheldon Standing, MD as Consulting Physician (General Surgery) Vertell Pont, RN as Oncology Nurse Navigator  ASSESSMENT & PLAN:  Gregg [name] is a 68 y.o.male with history of prostate adenocarcinoma, asthma, hypertension, hyperlipidemia being seen at [facility] for prostate cancer.  Patient with history of pT3a GG4 prostate cancer status post RP and right pelvic lymph node dissection on 06/28/2023. Pretreatment PSA was 5.69. PSA was 0.018 on 09/21/23. Most recent PSA was 0.55 on 6/17.  Current diagnosis: rising PSA Initial diagnosis: 2024. 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 EPE+ at right posterolateral mid and base (pT3a). PET from 06/20/24 showed a single focus radiotracer activity at the junction of the LEFT 4th rib and transverse process reported favored benign etiology. Report from CT appears like an old fracture. A mild radiotracer activity associated with a RIGHT hilar lymph node reported favored benign.  Germline testing: neg in 2024 Somatic testing: not yet  His case was discussed at tumor board with multiple specialists including radiation oncologist, urology oncologist, pathologist, radiologist. The patient was counseled on the natural history of prostate cancer and the standard treatment options that are available for prostate cancer. Recommendation was to proceed with ADT plus salvage radiation after his upcoming cardiac PET to rule out ischemic etiology.   Patient will follow-up with radiation oncology for definitive treatment.  He will follow-up with Dr. Renda for long-term surveillance. He may follow-up with medical oncology as needed.  Other recommendations: Weight-bearing exercises (30 minutes per day) Limit alcohol consumption and avoid smoking   Pauletta JAYSON Chihuahua, MD 7/21/20254:08  PM  CHIEF COMPLAINTS/PURPOSE OF CONSULTATION:  prostate cancer  HISTORY OF PRESENTING ILLNESS:  Gregg [name] 68 y.o. male is here because of prostate cancer.  I have reviewed his chart and materials related to his cancer extensively and collaborated history with the patient. Summary of oncologic history is as follows:  He denies any urinary symptoms such as dysuria, increased frequency, hesitancy, hematuria or difficulty urinating.  Oncology History  Malignant neoplasm of prostate (HCC)  03/31/2023 Cancer Staging   Staging form: Prostate, AJCC 8th Edition - Clinical stage from 03/31/2023: Stage IIC (cT2a, cN0, cM0, PSA: 5.7, Grade Group: 4) - Signed by Sherwood Rise, PA-C on 05/18/2023 Histopathologic type: Adenocarcinoma, NOS Stage prefix: Initial diagnosis Prostate specific antigen (PSA) range: Less than 10 Gleason primary pattern: 4 Gleason secondary pattern: 4 Gleason score: 8 Histologic grading system: 5 grade system Number of biopsy cores examined: 15 Number of biopsy cores positive: 8 Location of positive needle core biopsies: One side   05/18/2023 Initial Diagnosis   Malignant neoplasm of prostate [facility])     MEDICAL HISTORY:  Past Medical History:  Diagnosis Date   Anxiety    Arthritis    Asthma    Bilateral inguinal hernia 06/02/2021   Cancer (HCC) 04/05/2023   Prostrate CA early   Chronic kidney disease    GERD (gastroesophageal reflux disease)    Hemorrhoids, internal    History of kidney stones 2020   Hyperlipidemia    Hypertension    Right BBB/    Sinus complaint 06/02/2021   x 1 week per pt   Thyroid  disease    Wears glasses 06/02/2021    SURGICAL HISTORY: Past Surgical History:  Procedure Laterality Date   CHOLECYSTECTOMY     COLONOSCOPY WITH PROPOFOL  N/A  12/31/2021   Procedure: COLONOSCOPY WITH PROPOFOL ;  Surgeon: San Sandor GAILS, DO;  Location: [facility];  Service: Gastroenterology;  Laterality: N/A;   ENDOSCOPIC MUCOSAL  RESECTION N/A 12/31/2021   Procedure: ENDOSCOPIC MUCOSAL RESECTION;  Surgeon: San Sandor GAILS, DO;  Location: [facility];  Service: Gastroenterology;  Laterality: N/A;   EXTRACORPOREAL SHOCK WAVE LITHOTRIPSY Left 02/23/2019   Procedure: EXTRACORPOREAL SHOCK WAVE LITHOTRIPSY (ESWL);  Surgeon: Gaston Hamilton, MD;  Location: [facility];  Service: Urology;  Laterality: Left;   HEMOSTASIS CLIP PLACEMENT  12/31/2021   Procedure: HEMOSTASIS CLIP PLACEMENT;  Surgeon: San Sandor GAILS, DO;  Location: [facility];  Service: Gastroenterology;;   INGUINAL HERNIA REPAIR Bilateral 06/06/2021   Procedure: LAPAROSCOPIC BILATERAL INGUINAL HERNIA REPAIRS WITH MESH;  Surgeon: Sheldon Standing, MD;  Location[facility];  Service: General;  Laterality: Bilateral;  TAP BLOCK   LAPAROSCOPIC APPENDECTOMY N/A 03/07/2021   Procedure: APPENDECTOMY LAPAROSCOPIC;  Surgeon: Sheldon Standing, MD;  Location: [facility];  Service: General;  Laterality: N/A;   LYMPHADENECTOMY Bilateral 06/28/2023   Procedure: BILATERAL PELVIC LYMPHADENECTOMY;  Surgeon: Renda Glance, MD;  Location: [facility];  Service: Urology;  Laterality: Bilateral;   PROSTATE BIOPSY     ROBOT ASSISTED LAPAROSCOPIC RADICAL PROSTATECTOMY N/A 06/28/2023   Procedure: XI ROBOTIC ASSISTED LAPAROSCOPIC RADICAL PROSTATECTOMY LEVEL 3;  Surgeon: Renda Glance, MD;  Location: [facility];  Service: Urology;  Laterality: N/A;  210 MINUTES NEEDED FOR CASE   SUBMUCOSAL LIFTING INJECTION  12/31/2021   Procedure: SUBMUCOSAL LIFTING INJECTION;  Surgeon: San Sandor GAILS, DO;  Location: [facility];  Service: Gastroenterology;;   THYROIDECTOMY N/A 10/02/2022   Procedure: TOTAL THYROIDECTOMY;  Surgeon: Eletha Boas, MD;  Location: [facility];  Service: General;  Laterality: N/A;    SOCIAL HISTORY: Social History   Socioeconomic History   Marital status: Single    Spouse name: Gregg [name]   Number of children: 0   Years of education: Not on file   Highest education level:  Bachelor's degree (e.g., BA, AB, BS)  Occupational History   Occupation: [employer]ing [employer]  Tobacco Use   Smoking status: Former    Current packs/day: 0.00    Average packs/day: 0.5 packs/day for 40.0 years (20.0 ttl pk-yrs)    Types: Cigarettes    Start date: 03/29/1977    Quit date: 03/29/2017    Years since quitting: 7.2    Passive exposure: Past   Smokeless tobacco: Never  Vaping Use   Vaping status: Never Used  Substance and Sexual Activity   Alcohol use: Yes    Comment: rare   Drug use: Never   Sexual activity: Not on file  Other Topics Concern   Not on file  Social History Narrative   Not on file   Social Drivers of Health   Financial Resource Strain: Low Risk  (02/10/2024)   Overall Financial Resource Strain (CARDIA)    Difficulty of Paying Living Expenses: Not hard at all  Food Insecurity: No Food Insecurity (02/10/2024)   Hunger Vital Sign    Worried About Running Out of Food in the Last Year: Never true    Ran Out of Food in the Last Year: Never true  Transportation Needs: No Transportation Needs (02/10/2024)   PRAPARE - Administrator, Civil Service (Medical): No    Lack of Transportation (Non-Medical): No  Physical Activity: Insufficiently Active (02/10/2024)   Exercise Vital Sign    Days of Exercise per Week: 4 days    Minutes of  Exercise per Session: 20 min  Stress: No Stress Concern Present (02/10/2024)   [employer] of Occupational Health - Occupational Stress Questionnaire    Feeling of Stress : Not at all  Social Connections: Moderately Integrated (02/10/2024)   Social Connection and Isolation Panel    Frequency of Communication with Friends and Family: More than three times a week    Frequency of Social Gatherings with Friends and Family: Twice a week    Attends Religious Services: More than 4 times per year    Active Member of [employer] or Organizations: Yes    Attends Engineer, structural: More than 4 times per year     Marital Status: Never married  Intimate Partner Violence: Not At Risk (06/28/2023)   Humiliation, Afraid, Rape, and Kick questionnaire    Fear of Current or Ex-Partner: No    Emotionally Abused: No    Physically Abused: No    Sexually Abused: No    FAMILY HISTORY: Family History  Problem Relation Age of Onset   Thyroid  disease Mother    Breast cancer Mother 66 - 45   Thyroid  cancer Father 21   Thyroid  disease Sister    Prostate cancer Brother 57   Bone cancer Brother 50       agent orange exposure   Prostate cancer Paternal Uncle 74   Kidney cancer Paternal Grandfather 60 - 78   Stomach cancer Neg Hx    Colon cancer Neg Hx    Esophageal cancer Neg Hx    Rectal cancer Neg Hx    Colon polyps Neg Hx     ALLERGIES:  is allergic to shellfish allergy.  MEDICATIONS:  Current Outpatient Medications  Medication Sig Dispense Refill   albuterol  (VENTOLIN  HFA) 108 (90 Base) MCG/ACT inhaler Inhale 2 puffs into the lungs every 6 (six) hours as needed for wheezing or shortness of breath. 8 g 0   amLODipine  (NORVASC ) 5 MG tablet Take 1 tablet (5 mg total) by mouth daily. 90 tablet 0   atorvastatin  (LIPITOR) 80 MG tablet Take 1 tablet (80 mg total) by mouth daily. 90 tablet 3   fluticasone  (FLONASE ) 50 MCG/ACT nasal spray Place 2 sprays into both nostrils daily. 16 g 0   fluticasone  furoate-vilanterol (BREO ELLIPTA ) 100-25 MCG/ACT AEPB Inhale 1 puff into the lungs daily. 60 each 2   levothyroxine  (SYNTHROID ) 112 MCG tablet Take 1 tablet (112 mcg total) by mouth daily. 90 tablet 3   losartan  (COZAAR ) 100 MG tablet TAKE 1 TABLET BY MOUTH EVERY DAY 90 tablet 0   pantoprazole  (PROTONIX ) 40 MG tablet TAKE 1 TABLET BY MOUTH DAILY BEFORE BREAKFAST 90 tablet 0   sertraline  (ZOLOFT ) 25 MG tablet TAKE 1 TABLET (25 MG TOTAL) BY MOUTH DAILY. 90 tablet 0   tadalafil (CIALIS) 5 MG tablet Take 5 mg by mouth daily.     No current facility-administered medications for this visit.    REVIEW OF SYSTEMS:    All relevant systems were reviewed with the patient and are negative.  PHYSICAL EXAMINATION: ECOG PERFORMANCE STATUS: 0 - Asymptomatic  VSS  GENERAL: alert, no distress and comfortable  LABORATORY & PATHOLOGY DATA:  I have reviewed the results of labs, PSA and biopsy results related to his cancer.  RADIOGRAPHIC STUDIES: I have reviewed the radiological images related to his cancer during tumor board meeting.

## 2024-07-11 ENCOUNTER — Inpatient Hospital Stay

## 2024-07-11 ENCOUNTER — Encounter: Payer: Self-pay | Admitting: Radiation Oncology

## 2024-07-11 ENCOUNTER — Ambulatory Visit
Admission: RE | Admit: 2024-07-11 | Discharge: 2024-07-11 | Disposition: A | Discharge status: Home / Self Care | Source: Ambulatory Visit | Attending: Radiation Oncology | Admitting: Radiation Oncology

## 2024-07-11 VITALS — BP 137/77 | HR 72 | Temp 97.2°F | Resp 18 | Ht 69.0 in | Wt 180.4 lb

## 2024-07-11 DIAGNOSIS — Z803 Family history of malignant neoplasm of breast: Secondary | ICD-10-CM | POA: Insufficient documentation

## 2024-07-11 DIAGNOSIS — C61 Malignant neoplasm of prostate: Secondary | ICD-10-CM

## 2024-07-11 DIAGNOSIS — Z8042 Family history of malignant neoplasm of prostate: Secondary | ICD-10-CM | POA: Diagnosis not present

## 2024-07-11 DIAGNOSIS — Z8051 Family history of malignant neoplasm of kidney: Secondary | ICD-10-CM | POA: Diagnosis not present

## 2024-07-11 DIAGNOSIS — Z87891 Personal history of nicotine dependence: Secondary | ICD-10-CM | POA: Diagnosis not present

## 2024-07-11 HISTORY — DX: Elevated prostate specific antigen (PSA): R97.20

## 2024-07-11 NOTE — Progress Notes (Signed)
 Radiation Oncology         [phone] ________________________________  Multidisciplinary Prostate Cancer Clinic  Initial Radiation Oncology Consultation  Name: Gregg [name] MRN: [id number]  Date: 07/11/2024  DOB: Apr 07, 1956  RR:Duzeyzwd, Greig PARAS, NP  Renda Glance, MD   REFERRING PHYSICIAN: Renda Glance, MD  DIAGNOSIS: 68 y.o. 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 Malignant neoplasm of prostate (HCC)  C61       HISTORY OF PRESENT ILLNESS::Gregg [name] is a 68 y.o. gentleman.  with a diagnosis of prostate cancer. He has a history of kidney stones and was initially referred to Dr. Cam in 01/2022 for increased LUTS after starting Lasix. At that time, he was found to have a distinct ridge in the right mid lobe of his prostate on digital rectal examination, as well as an elevated PSA of 4.55. He was started on myrbetriq but unfortunately could not tolerate the medication due to heart palpitation. A repeat PSA in 07/2022 showed a slight decrease at 4.05, but PSA in 01/2023 showed an increase to 5.69. This prompted a prostate MRI which was performed on 03/15/23 showing a PI-RADS 5 lesion in the right peripheral zone.      The patient proceeded to MRI fusion biopsy of the prostate on 03/31/23.  The prostate volume measured 65 cc.  Out of 15 core biopsies, 8 were positive.  The maximum Gleason score was 4+4, and this was seen in two cores from the MRI ROI and the right base lateral. Additionally, Gleason 4+3 was seen in the third core from the MRI ROI, Gleason 3+4 in the right mid and right mid lateral, and Gleason 3+3 in the right base.    He underwent a staging PSMA PET scan on 04/26/23 showing no evidence of radiotracer activity outside of the prostate.    He elected prostatectomy on 06/27/24 and pathology  showed pT3a, pN0 with EPE.  Whole mount images were presented and reviewed in our conference today and displayed below:         Post-Op, his PSA was intially undetectable, then 0.52 in June and repeat PSA in July has risen to 0.55 and PSMA PET scan shows no evidence of local prostate carcinoma recurrence in the prostate bed, no evidence of metastatic adenopathy in the pelvis or periaortic retroperitoneum.  It does show a single focus radiotracer activity at the junction of the LEFT fourth rib and transverse process is favored benign etiology as well as mild radiotracer activity associated with a RIGHT hilar lymph node is favored benign.  The patient reviewed the PET results with his urologist and he has kindly been referred today to the multidisciplinary prostate cancer clinic for presentation of pathology and radiology studies in our conference for discussion of potential radiation treatment options and clinical evaluation.  PREVIOUS RADIATION THERAPY: No  PAST MEDICAL HISTORY:  has a past medical history of Anxiety, Arthritis, Asthma, Bilateral inguinal hernia (06/02/2021), Cancer (HCC) (04/05/2023), Chronic kidney disease, Elevated PSA, GERD (gastroesophageal reflux disease), Hemorrhoids, internal, History of kidney stones (2020), Hyperlipidemia, Hypertension, Right BBB/, Sinus complaint (06/02/2021), Thyroid  disease, and Wears glasses (06/02/2021).    PAST SURGICAL HISTORY: Past Surgical History:  Procedure Laterality Date   CHOLECYSTECTOMY     COLONOSCOPY WITH PROPOFOL  N/A 12/31/2021   Procedure: COLONOSCOPY WITH PROPOFOL ;  Surgeon: San,  Sandor GAILS, DO;  Location: [facility];  Service: Gastroenterology;  Laterality: N/A;   ENDOSCOPIC MUCOSAL RESECTION N/A 12/31/2021   Procedure: ENDOSCOPIC MUCOSAL RESECTION;  Surgeon: San Sandor GAILS, DO;  Location: [facility];  Service: Gastroenterology;  Laterality: N/A;   EXTRACORPOREAL SHOCK WAVE LITHOTRIPSY Left 02/23/2019   Procedure:  EXTRACORPOREAL SHOCK WAVE LITHOTRIPSY (ESWL);  Surgeon: Gaston Hamilton, MD;  Location: [facility];  Service: Urology;  Laterality: Left;   HEMOSTASIS CLIP PLACEMENT  12/31/2021   Procedure: HEMOSTASIS CLIP PLACEMENT;  Surgeon: San Sandor GAILS, DO;  Location: [facility];  Service: Gastroenterology;;   INGUINAL HERNIA REPAIR Bilateral 06/06/2021   Procedure: LAPAROSCOPIC BILATERAL INGUINAL HERNIA REPAIRS WITH MESH;  Surgeon: Sheldon Standing, MD;  Location: [facility];  Service: General;  Laterality: Bilateral;  TAP BLOCK   LAPAROSCOPIC APPENDECTOMY N/A 03/07/2021   Procedure: APPENDECTOMY LAPAROSCOPIC;  Surgeon: Sheldon Standing, MD;  Location: [facility];  Service: General;  Laterality: N/A;   LYMPHADENECTOMY Bilateral 06/28/2023   Procedure: BILATERAL PELVIC LYMPHADENECTOMY;  Surgeon: Renda Glance, MD;  Location: [facility];  Service: Urology;  Laterality: Bilateral;   PROSTATE BIOPSY     ROBOT ASSISTED LAPAROSCOPIC RADICAL PROSTATECTOMY N/A 06/28/2023   Procedure: XI ROBOTIC ASSISTED LAPAROSCOPIC RADICAL PROSTATECTOMY LEVEL 3;  Surgeon: Renda Glance, MD;  Location: [facility];  Service: Urology;  Laterality: N/A;  210 MINUTES NEEDED FOR CASE   SUBMUCOSAL LIFTING INJECTION  12/31/2021   Procedure: SUBMUCOSAL LIFTING INJECTION;  Surgeon: San Sandor GAILS, DO;  Location: [facility];  Service: Gastroenterology;;   THYROIDECTOMY N/A 10/02/2022   Procedure: TOTAL THYROIDECTOMY;  Surgeon: Eletha Boas, MD;  Location: [facility];  Service: General;  Laterality: N/A;    FAMILY HISTORY: family history includes Bone cancer (age of onset: 3) in his brother; Breast cancer (age of onset: 17 - 64) in his mother; Kidney cancer (age of onset: 60 - 46) in his paternal grandfather; Prostate cancer (age of onset: 36) in his paternal uncle; Prostate cancer (age of onset: 55) in his brother; Thyroid  cancer (age of onset: 48) in his father; Thyroid  disease in his mother and sister.  SOCIAL HISTORY:  reports that  he quit smoking about 7 years ago. His smoking use included cigarettes. He started smoking about 47 years ago. He has a 20 pack-year smoking history. He has been exposed to tobacco smoke. He has never used smokeless tobacco. He reports current alcohol use. He reports that he does not use drugs.  ALLERGIES: Shellfish allergy  MEDICATIONS:  Current Outpatient Medications  Medication Sig Dispense Refill   albuterol  (VENTOLIN  HFA) 108 (90 Base) MCG/ACT inhaler Inhale 2 puffs into the lungs every 6 (six) hours as needed for wheezing or shortness of breath. 8 g 0   amLODipine  (NORVASC ) 5 MG tablet Take 1 tablet (5 mg total) by mouth daily. 90 tablet 0   atorvastatin  (LIPITOR) 80 MG tablet Take 1 tablet (80 mg total) by mouth daily. 90 tablet 3   fluticasone  (FLONASE ) 50 MCG/ACT nasal spray Place 2 sprays into both nostrils daily. 16 g 0   fluticasone  furoate-vilanterol (BREO ELLIPTA ) 100-25 MCG/ACT AEPB Inhale 1 puff into the lungs daily. 60 each 2   levothyroxine  (SYNTHROID ) 112 MCG tablet Take 1 tablet (112 mcg total) by mouth daily. 90 tablet 3   losartan  (COZAAR ) 100 MG tablet TAKE 1 TABLET BY MOUTH EVERY DAY 90 tablet 0   pantoprazole  (PROTONIX ) 40 MG tablet TAKE 1 TABLET BY MOUTH DAILY BEFORE BREAKFAST 90 tablet 0   sertraline  (ZOLOFT )  25 MG tablet TAKE 1 TABLET (25 MG TOTAL) BY MOUTH DAILY. 90 tablet 0   tadalafil (CIALIS) 5 MG tablet Take 5 mg by mouth daily.     No current facility-administered medications for this encounter.      REVIEW OF SYSTEMS:  On review of systems, the patient reports that he is doing well overall. He denies any chest pain, shortness of breath, cough, fevers, chills, night sweats, unintended weight changes. He denies any bowel disturbances, and denies abdominal pain, nausea or vomiting. He denies any new musculoskeletal or joint aches or pains. His IPSS was Total Score: 4, indicating mild urinary symptoms (Reference 0-7 mild, 8-19 moderate, 20-35 severe).  His SHIM:  5, indicating he has severe erectile dysfunction (Reference - 22-25 None, 17-21 Mild, 8-16 Moderate, 1-7 Severe). A complete review of systems is obtained and is otherwise negative.   PHYSICAL EXAM: This patient is in no acute distress.  He is alert and oriented.   height is 5' 9 (1.753 m) and weight is 180 lb 6.4 oz (81.8 kg). His temperature is 97.2 F (36.2 C) (abnormal). His blood pressure is 137/77 and his pulse is 72. His respiration is 18 and oxygen saturation is 97%.  He exhibits no respiratory distress or labored breathing.  He appears neurologically intact.  His mood is pleasant.  His affect is appropriate.  Please note the digital rectal exam findings described above.  KPS = 100  100 - Normal; no complaints; no evidence of disease. 90   - Able to carry on normal activity; minor signs or symptoms of disease. 80   - Normal activity with effort; some signs or symptoms of disease. 59   - Cares for self; unable to carry on normal activity or to do active work. 60   - Requires occasional assistance, but is able to care for most of his personal needs. 50   - Requires considerable assistance and frequent medical care. 40   - Disabled; requires special care and assistance. 30   - Severely disabled; hospital admission is indicated although death not imminent. 20   - Very sick; hospital admission necessary; active supportive treatment necessary. 10   - Moribund; fatal processes progressing rapidly. 0     - Dead  Karnofsky DA, Abelmann WH, Craver LS and [facility] [phone]) The use of the nitrogen mustards in the palliative treatment of carcinoma: with particular reference to bronchogenic carcinoma Cancer 1 [id number]   LABORATORY DATA:  Lab Results  Component Value Date   WBC 6.9 10/05/2023   HGB 15.5 10/05/2023   HCT 46.3 10/05/2023   MCV 92.2 10/05/2023   PLT 253.0 10/05/2023   Lab Results  Component Value Date   NA 140 03/14/2024   K 4.4 03/14/2024   CL 101 03/14/2024   CO2 25  03/14/2024   Lab Results  Component Value Date   ALT 26 10/05/2023   AST 20 10/05/2023   ALKPHOS 87 10/05/2023   BILITOT 0.6 10/05/2023     RADIOGRAPHY: NM PET (PSMA) SKULL TO MID THIGH Result Date: 06/22/2024 CLINICAL DATA:  Prostate carcinoma with biochemical recurrence. PSA equal 0.55 EXAM: NUCLEAR MEDICINE PET SKULL BASE TO THIGH TECHNIQUE: 8.1 mCi Flotufolastat (Posluma ) was injected intravenously. Full-ring PET imaging was performed from the skull base to thigh after the radiotracer. CT data was obtained and used for attenuation correction and anatomic localization. COMPARISON:  PSMA PET scan 04/26/2019 FINDINGS: NECK No radiotracer activity in neck lymph nodes. Incidental CT finding: None. CHEST  Mild radiotracer activity associated with a RIGHT hilar lymph node with SUV max equal 4.0 on image 69. Activity slightly increased from comparison exam. No suspicious pulmonary nodules Incidental CT finding: None. ABDOMEN/PELVIS Prostate: No focal activity the prostate bed. Lymph nodes: No abnormal radiotracer accumulation within pelvic or abdominal nodes. Liver: No evidence of liver metastasis. Incidental CT finding: Atherosclerotic calcification of the aorta. Bilateral fat filled inguinal hernias. SKELETON Single focus radiotracer activity at junction the LEFT fourth rib and adjacent transverse process with SUV max equal 4.2 on image 57. no additional abnormal radiotracer activity. No suspicious lesions on CT portion exam. IMPRESSION: 1. No evidence of local prostate carcinoma recurrence in the prostate bed. 2. No evidence of metastatic adenopathy in the pelvis or periaortic retroperitoneum. 3. Single focus radiotracer activity at the junction of the LEFT fourth rib and transverse process is favored benign etiology. 4. Mild radiotracer activity associated with a RIGHT hilar lymph node is favored benign. 5.  Aortic Atherosclerosis (ICD10-I70.0). Electronically Signed   By: Jackquline Boxer M.D.   On:  06/22/2024 09:45      IMPRESSION: This gentleman is a 68 y.o. 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  He has biochemical failure s/p prostatectomy and may benefit from salvage IMRT with ST-ADT.   PLAN:Today I reviewed the findings and workup thus far.  We discussed the natural history of prostate cancer.  We reviewed the the implications of extracapsular extension on the risk of prostate cancer recurrence and the implications of rising PSA post-op.  Today, I talked to the patient and family about the findings and work-up thus far.  We discussed treatment, highlighting the role of radiotherapy in the management.  We discussed the available radiation techniques, and focused on the details of logistics and delivery.  We reviewed the anticipated acute and late sequelae associated with radiation in this setting.  The patient would like to proceed with radiation and will be scheduled for CT simulation.  The patient would like to proceed with ST-ADT and IMRT.    I personally spent 60 minutes in this encounter including chart review, reviewing radiological studies, meeting face-to-face with the patient, entering orders and completing documentation.    ------------------------------------------------  Donnice LABOR. Patrcia, M.D.

## 2024-07-11 NOTE — Consult Note (Signed)
 Multi-Disciplinary Clinic     07/11/2024   --------------------------------------------------------------------------------   Alm PHEBE Pat  MRN: [id number]  DOB: Apr 14, 1956, 68 year old Male  SSN:    PRIMARY CARE:  Greig Drones, NP  PRIMARY CARE FAX:  [phone]  REFERRING:  Gretel Renda Mickey CHRISTELLA  PROVIDER:  Gretel Renda, M.D.  LOCATION:  [facility] [phone]     --------------------------------------------------------------------------------   CC/HPI: CC: Prostate Cancer   PCP: Greig Drones, NP  Location of consult: [facility] Prostate Cancer Multid[facility]   Mr. Oesterle is a 68 year old gentleman with a past medical history significant for asthma, GERD, hyperlipidemia, hypertension, depression, hypothyroidism, and urolithiasis. He was initially seen in the multidisciplinary clinic in May 2020 for. At that time, he was noted to have a palpable abnormality in the right side of his prostate along with an elevated PSA of 5.69. He subsequently was diagnosed with Gleason 4+4 = 8 adenocarcinoma on a fusion biopsy. PSMA PET imaging on 04/26/2023 was negative for metastatic disease. He ultimately elected primary therapy with a robotic prostatectomy and bilateral pelvic lymphadenectomy completed on 06/28/2023. Surgical pathology indicated a P T3a [id number], Gleason 4+4 = 8 adenocarcinoma with ductal carcinoma components and negative surgical margins. His PSA was initially undetectable in October 2024 but was noted to have increased to 0.52 in June 2025. A confirmatory PSA on 06/06/2024 was still elevated at 0.55. He follows up today after undergoing a PSMA PET scan for further evaluation for biochemically recurrent disease.   Of note, he is scheduled for a stress test on August 19 after having a calcium  score CT scan that raise some concern.     ALLERGIES: None    MEDICATIONS: levoFLOXacin 1 tablet PO once Take on morning of the procedure   Levothyroxine  Sodium 125 MCG Capsule  Sildenafil Citrate 20 MG Tablet 1-5 tablets daily as needed  Tadalafil 2.5 MG Tablet 1 tablet PO Daily  Atorvastatin  Calcium  40 MG Tablet  Breo Ellipta  100-25 MCG/ACT Aerosol Powder Breath Activated  Co Q-10  Levocetirizine Dihydrochloride 5 MG Tablet  Losartan  Potassium-HCTZ 50-12.5 MG Tablet  Zoloft  25 MG Tablet     GU PSH: ESWL, Left - 2020 Prostate Needle Biopsy - 03/31/2023       PSH Notes: Thyroid  removed 09/2022   NON-GU PSH: Appendectomy (laparoscopic) - about 2022 Cholecystectomy (open) - about 2023 Colonoscopy - about 2022 Colonoscopy W/fb Remove - about 2023 Inguinal hernia repair (laparoscopic) - about 2022 Surgical Pathology, Gross And Microscopic Examination For Prostate Needle - 03/31/2023 Visit Complexity (formerly GPC1X) - 06/06/2024, 09/30/2023     GU PMH: ED due to arterial insufficiency - 06/06/2024, - 09/30/2023, - 07/06/2023, - 07/27/2022 Prostate Cancer - 06/06/2024, - 09/30/2023, - 07/06/2023, - 06/03/2023, - 06/01/2023, - 05/18/2023, - 04/08/2023 Stress Incontinence - 06/06/2024, - 12/28/2023, - 12/28/2023, - 11/25/2023, - 10/21/2023, - 09/30/2023, - 09/01/2023, - 08/09/2023, - 07/28/2023, - 07/06/2023, - 06/14/2023 Prostate nodule w/ LUTS - 04/08/2023, - 02/09/2023, - 2023 Elevated PSA - 03/31/2023, - 02/09/2023, - 07/27/2022 Nocturia - 07/27/2022, - 2023 Other dorsalgia - 2020 Renal calculus - 2020, - 2020      PMH Notes:   1) Prostate cancer: He is s/p a UNS RAL radical prostatectomy and BPLND on 06/28/23.   Diagnosis: pT3a N0 Mx, Gleason 4+4=8 adenocarcinoma with ductal carcinoma and negative surgical margins  Pretreatment PSA: 5.69  Pretreatment SHIM score: 16     NON-GU PMH: Muscle weakness (generalized) - 12/28/2023, - 12/28/2023, -  11/25/2023, - 10/21/2023 Other muscle spasm - 12/28/2023, - 12/28/2023, - 11/25/2023, - 10/21/2023 Hyperthyroidism - 2022 Asthma Depression GERD Hypercholesterolemia Hypertension Hypothyroidism    FAMILY  HISTORY: Atrial Fibrillation - Brother, Sister Breast Cancer - Mother Death - Sister, Father, Mother heart - Father   SOCIAL HISTORY: Marital Status: Single Preferred Language: English; Ethnicity: Not Hispanic Or Latino; Race: White Current Smoking Status: Patient does not smoke anymore. Has not smoked since 03/22/2017. Smoked for 30 years. Smoked 1 pack per day.   Tobacco Use Assessment Completed: Used Tobacco in last 30 days? Does not use smokeless tobacco. Does not drink anymore.  Does not use drugs. Drinks 1 caffeinated drink per day. Has not had a blood transfusion. Patient's occupation is/was [employer] Retired.    REVIEW OF SYSTEMS:    GU Review Male:   Patient denies frequent urination, hard to postpone urination, burning/ pain with urination, get up at night to urinate, leakage of urine, stream starts and stops, trouble starting your streams, and have to strain to urinate .  Gastrointestinal (Upper):   Patient denies nausea and vomiting.  Gastrointestinal (Lower):   Patient denies diarrhea and constipation.  Constitutional:   Patient denies fever, night sweats, weight loss, and fatigue.  Skin:   Patient denies skin rash/ lesion and itching.  Eyes:   Patient denies blurred vision and double vision.  Ears/ Nose/ Throat:   Patient denies sore throat and sinus problems.  Hematologic/Lymphatic:   Patient denies swollen glands and easy bruising.  Cardiovascular:   Patient denies leg swelling and chest pains.  Respiratory:   Patient denies cough and shortness of breath.  Endocrine:   Patient denies excessive thirst.  Musculoskeletal:   Patient denies back pain and joint pain.  Neurological:   Patient denies headaches and dizziness.  Psychologic:   Patient denies depression and anxiety.   VITAL SIGNS: None   MULTI-SYSTEM PHYSICAL EXAMINATION:    Constitutional: Well-nourished. No physical deformities. Normally developed. Good grooming.     Complexity of Data:  Lab Test  Review:   PSA  Records Review:   Pathology Reports, Previous Patient Records  X-Ray Review: PET- PSMA Scan: Reviewed Films.     06/06/24 05/30/24 09/21/23 01/26/23 07/27/22 01/27/22  PSA  Total PSA 0.55 ng/mL 0.52 ng/mL 0.018 ng/mL 5.69 ng/mL 4.05 ng/mL 4.55 ng/mL   Notes:                     CLINICAL DATA: Prostate carcinoma with biochemical recurrence. PSA  equal 0.55   EXAM:  NUCLEAR MEDICINE PET SKULL BASE TO THIGH   TECHNIQUE:  8.1 mCi Flotufolastat (Posluma ) was injected intravenously.  Full-ring PET imaging was performed from the skull base to thigh  after the radiotracer. CT data was obtained and used for attenuation  correction and anatomic localization.   COMPARISON: PSMA PET scan 04/26/2019   FINDINGS:  NECK   No radiotracer activity in neck lymph nodes.   Incidental CT finding: None.   CHEST   Mild radiotracer activity associated with a RIGHT hilar lymph node  with SUV max equal 4.0 on image 69. Activity slightly increased from  comparison exam.   No suspicious pulmonary nodules   Incidental CT finding: None.   ABDOMEN/PELVIS   Prostate: No focal activity the prostate bed.   Lymph nodes: No abnormal radiotracer accumulation within pelvic or  abdominal nodes.   Liver: No evidence of liver metastasis.   Incidental CT finding: Atherosclerotic calcification of the aorta.  Bilateral  fat filled inguinal hernias.   SKELETON   Single focus radiotracer activity at junction the LEFT fourth rib  and adjacent transverse process with SUV max equal 4.2 on image 57.  no additional abnormal radiotracer activity. No suspicious lesions  on CT portion exam.   IMPRESSION:  1. No evidence of local prostate carcinoma recurrence in the  prostate bed.  2. No evidence of metastatic adenopathy in the pelvis or periaortic  retroperitoneum.  3. Single focus radiotracer activity at the junction of the LEFT  fourth rib and transverse process is favored benign etiology.   4. Mild radiotracer activity associated with a RIGHT hilar lymph  node is favored benign.  5. Aortic Atherosclerosis (ICD10-I70.0).    Electronically Signed  By: Jackquline Boxer M.D.  On: 06/22/2024 09:45   PROCEDURES:          Visit Complexity - G2211    ASSESSMENT:      ICD-10 Details  1 GU:   Prostate Cancer - C61    PLAN:            Medications New Meds: Tadalafil 5 MG Tablet 1 tablet PO Daily   #30  5 Refill(s)  Pharmacy Name:  [id number]  Address:  [address]   [place], [place] [address]  Phone:  [phone]  Fax:  [phone]            Document Letter(s):  Created for Patient: Clinical Summary   Created for Patient: Clinical Summary         Notes:   1. Biochemically recurrent prostate cancer: We reviewed his PSMA PET scan which fortunately did not suggest obvious measurable disease. The avidity seen on his fourth rib appears more consistent with an old fracture based on the CT findings. As such, we have reviewed options and he is interested in proceeding with short-term androgen deprivation therapy and salvage radiation therapy to the prostatic fossa. He will begin androgen deprivation and will await his cardiac stress test before beginning radiation therapy in case he does require cardiac intervention. He will then be scheduled to see me approximately 6 months after beginning androgen deprivation.   CC: Greig Gravely, NP  Dr. Dorn Lesches  Dr. Donnice Barge  Dr. Pauletta Chihuahua

## 2024-07-14 ENCOUNTER — Other Ambulatory Visit: Payer: Self-pay | Admitting: Family

## 2024-07-14 DIAGNOSIS — E785 Hyperlipidemia, unspecified: Secondary | ICD-10-CM

## 2024-07-18 ENCOUNTER — Telehealth: Payer: Self-pay | Admitting: *Deleted

## 2024-07-18 DIAGNOSIS — C61 Malignant neoplasm of prostate: Secondary | ICD-10-CM

## 2024-07-18 NOTE — Progress Notes (Signed)
 Patient is scheduled for his ADT at [facility] on 7/30.  RN spoke with patient, all questions answered.  Referral placed for consult with Dr. Jomarie after his cardiac stress test on 8/19.

## 2024-07-18 NOTE — Telephone Encounter (Signed)
 PATIENT TO HAVE AN APPT. FOR CONSULTATION WITH DR. PALERMO ON 08-16-24 @ 10:30 AM

## 2024-07-20 NOTE — Progress Notes (Signed)
 Patient received Eligard 45mg  on 7/30.  Patient will have consult with Dr. Jomarie on 08/16/24.   Will follow up with patient after cardiac stress test on 8/19.  Plan of care in progress.

## 2024-08-01 ENCOUNTER — Telehealth (HOSPITAL_COMMUNITY): Payer: Self-pay

## 2024-08-01 NOTE — Telephone Encounter (Signed)
 Spoke with the patient, detailed instructions given. S.Sueo Cullen CCT

## 2024-08-07 ENCOUNTER — Other Ambulatory Visit: Payer: Self-pay | Admitting: Cardiovascular Disease

## 2024-08-07 DIAGNOSIS — R0609 Other forms of dyspnea: Secondary | ICD-10-CM

## 2024-08-07 DIAGNOSIS — R0602 Shortness of breath: Secondary | ICD-10-CM

## 2024-08-08 ENCOUNTER — Other Ambulatory Visit (HOSPITAL_COMMUNITY)

## 2024-08-08 ENCOUNTER — Ambulatory Visit: Payer: Self-pay | Admitting: Cardiovascular Disease

## 2024-08-08 ENCOUNTER — Ambulatory Visit (HOSPITAL_COMMUNITY)
Admission: RE | Admit: 2024-08-08 | Discharge: 2024-08-08 | Disposition: A | Discharge status: Home / Self Care | Source: Ambulatory Visit | Attending: Internal Medicine | Admitting: Internal Medicine

## 2024-08-08 DIAGNOSIS — R0602 Shortness of breath: Secondary | ICD-10-CM | POA: Insufficient documentation

## 2024-08-08 DIAGNOSIS — R0609 Other forms of dyspnea: Secondary | ICD-10-CM | POA: Insufficient documentation

## 2024-08-08 DIAGNOSIS — R931 Abnormal findings on diagnostic imaging of heart and coronary circulation: Secondary | ICD-10-CM

## 2024-08-08 DIAGNOSIS — E782 Mixed hyperlipidemia: Secondary | ICD-10-CM

## 2024-08-08 LAB — MYOCARDIAL PERFUSION IMAGING
LV dias vol: 69 mL (ref 62–150)
LV sys vol: 15 mL (ref 4.2–5.8)
Nuc Stress EF: 78 %
Peak HR: 96 {beats}/min
Rest HR: 78 {beats}/min
Rest Nuclear Isotope Dose: 10.5 mCi
SDS: 3
SRS: 3
SSS: 3
ST Depression (mm): 0 mm
Stress Nuclear Isotope Dose: 30.8 mCi
TID: 0.82

## 2024-08-08 MED ORDER — TECHNETIUM TC 99M TETROFOSMIN IV KIT
30.8000 | PACK | Freq: Once | INTRAVENOUS | Status: AC | PRN
  Administered 2024-08-08: 30.8 via INTRAVENOUS

## 2024-08-08 MED ORDER — REGADENOSON 0.4 MG/5ML IV SOLN
0.4000 mg | Freq: Once | INTRAVENOUS | Status: AC
  Administered 2024-08-08: 0.4 mg via INTRAVENOUS

## 2024-08-08 MED ORDER — REGADENOSON 0.4 MG/5ML IV SOLN
INTRAVENOUS | Status: AC
  Filled 2024-08-08: qty 5

## 2024-08-08 MED ORDER — TECHNETIUM TC 99M TETROFOSMIN IV KIT
10.5000 | PACK | Freq: Once | INTRAVENOUS | Status: AC | PRN
  Administered 2024-08-08: 10.5 via INTRAVENOUS

## 2024-08-09 ENCOUNTER — Other Ambulatory Visit: Payer: Self-pay | Admitting: Family Medicine

## 2024-08-09 DIAGNOSIS — I10 Essential (primary) hypertension: Secondary | ICD-10-CM

## 2024-08-09 NOTE — Telephone Encounter (Signed)
 Complete

## 2024-08-12 ENCOUNTER — Ambulatory Visit: Payer: Self-pay | Admitting: Cardiovascular Disease

## 2024-08-16 ENCOUNTER — Encounter: Payer: Self-pay | Admitting: Radiation Oncology

## 2024-08-16 ENCOUNTER — Ambulatory Visit
Admission: RE | Admit: 2024-08-16 | Discharge: 2024-08-16 | Disposition: A | Discharge status: Home / Self Care | Source: Ambulatory Visit | Attending: Radiation Oncology | Admitting: Radiation Oncology

## 2024-08-16 VITALS — BP 141/82 | HR 64 | Temp 98.4°F | Resp 18 | Ht 69.0 in | Wt 181.7 lb

## 2024-08-16 DIAGNOSIS — E785 Hyperlipidemia, unspecified: Secondary | ICD-10-CM | POA: Diagnosis not present

## 2024-08-16 DIAGNOSIS — R9721 Rising PSA following treatment for malignant neoplasm of prostate: Secondary | ICD-10-CM | POA: Insufficient documentation

## 2024-08-16 DIAGNOSIS — Z79890 Hormone replacement therapy (postmenopausal): Secondary | ICD-10-CM | POA: Diagnosis not present

## 2024-08-16 DIAGNOSIS — Z7951 Long term (current) use of inhaled steroids: Secondary | ICD-10-CM | POA: Diagnosis not present

## 2024-08-16 DIAGNOSIS — C61 Malignant neoplasm of prostate: Secondary | ICD-10-CM | POA: Insufficient documentation

## 2024-08-16 DIAGNOSIS — M129 Arthropathy, unspecified: Secondary | ICD-10-CM | POA: Diagnosis not present

## 2024-08-16 DIAGNOSIS — Z87442 Personal history of urinary calculi: Secondary | ICD-10-CM | POA: Insufficient documentation

## 2024-08-16 DIAGNOSIS — Z87891 Personal history of nicotine dependence: Secondary | ICD-10-CM | POA: Diagnosis not present

## 2024-08-16 DIAGNOSIS — Z8042 Family history of malignant neoplasm of prostate: Secondary | ICD-10-CM | POA: Diagnosis not present

## 2024-08-16 DIAGNOSIS — Z803 Family history of malignant neoplasm of breast: Secondary | ICD-10-CM | POA: Diagnosis not present

## 2024-08-16 DIAGNOSIS — K449 Diaphragmatic hernia without obstruction or gangrene: Secondary | ICD-10-CM | POA: Insufficient documentation

## 2024-08-16 DIAGNOSIS — Z79899 Other long term (current) drug therapy: Secondary | ICD-10-CM | POA: Diagnosis not present

## 2024-08-16 DIAGNOSIS — I10 Essential (primary) hypertension: Secondary | ICD-10-CM | POA: Insufficient documentation

## 2024-08-16 DIAGNOSIS — N189 Chronic kidney disease, unspecified: Secondary | ICD-10-CM | POA: Insufficient documentation

## 2024-08-17 NOTE — Progress Notes (Signed)
 Radiation Oncology         [phone] ________________________________  Name: Gregg [name]        MRN: [id number]  Date of Service: 08/16/2024 DOB: 04/07/1956  RR:Duzeyzwd, Greig PARAS, NP  Patrcia Cough, MD     REFERRING PHYSICIAN: Renda Glance, MD   DIAGNOSIS: Rising PSA, post prostatectomy   HISTORY OF PRESENT ILLNESS: Gregg [name] is a 68 y.o. male seen regarding his diagnosis of prostate carcinoma.  He underwent radical prostatectomy in 2024, with pathology revealing Gleason 8 adenocarcinoma, with extraprostatic extension noted.  Surgical margins were clear.  Following surgery, his PSA was undetectable, but eventually began to rise.  In June, it had risen to 0.52, and further rose to 0.55 in July.  PSMA PET scan was performed, reviewed no evidence of local recurrence in the prostate bed, or lymphadenopathy or metastatic involvement.  He was seen in consultation by Dr. Adina Patrcia, and salvage external beam radiation was discussed.  As he lives in [place], he asked that his radiation be performed at our facility.  Of note, androgen deprivation therapy was commenced approximately 3 weeks ago.    PREVIOUS RADIATION THERAPY: No   PAST MEDICAL HISTORY:  Past Medical History:  Diagnosis Date   Anxiety    Arthritis    Asthma    Bilateral inguinal hernia 06/02/2021   Cancer (HCC) 04/05/2023   Prostrate CA early   Chronic kidney disease    Elevated PSA    GERD (gastroesophageal reflux disease)    Hemorrhoids, internal    History of kidney stones 2020   Hyperlipidemia    Hypertension    Right BBB/    Sinus complaint 06/02/2021   x 1 week per pt   Thyroid  disease    Wears glasses 06/02/2021       PAST SURGICAL HISTORY: Past Surgical History:  Procedure Laterality Date   CHOLECYSTECTOMY     COLONOSCOPY WITH PROPOFOL  N/A 12/31/2021   Procedure: COLONOSCOPY WITH PROPOFOL ;  Surgeon: San Sandor GAILS, DO;  Location: [facility];  Service:  Gastroenterology;  Laterality: N/A;   ENDOSCOPIC MUCOSAL RESECTION N/A 12/31/2021   Procedure: ENDOSCOPIC MUCOSAL RESECTION;  Surgeon: San Sandor GAILS, DO;  Location: [facility];  Service: Gastroenterology;  Laterality: N/A;   EXTRACORPOREAL SHOCK WAVE LITHOTRIPSY Left 02/23/2019   Procedure: EXTRACORPOREAL SHOCK WAVE LITHOTRIPSY (ESWL);  Surgeon: Gaston Hamilton, MD;  Location: [facility];  Service: Urology;  Laterality: Left;   HEMOSTASIS CLIP PLACEMENT  12/31/2021   Procedure: HEMOSTASIS CLIP PLACEMENT;  Surgeon: San Sandor GAILS, DO;  Location: [facility];  Service: Gastroenterology;;   INGUINAL HERNIA REPAIR Bilateral 06/06/2021   Procedure: LAPAROSCOPIC BILATERAL INGUINAL HERNIA REPAIRS WITH MESH;  Surgeon: Sheldon Standing, MD;  Location: [facility] [place];  Service: General;  Laterality: Bilateral;  TAP BLOCK   LAPAROSCOPIC APPENDECTOMY N/A 03/07/2021   Procedure: APPENDECTOMY LAPAROSCOPIC;  Surgeon: Sheldon Standing, MD;  Location: [facility];  Service: General;  Laterality: N/A;   LYMPHADENECTOMY Bilateral 06/28/2023   Procedure: BILATERAL PELVIC LYMPHADENECTOMY;  Surgeon: Renda Glance, MD;  Location: [facility];  Service: Urology;  Laterality: Bilateral;   PROSTATE BIOPSY     ROBOT ASSISTED LAPAROSCOPIC RADICAL PROSTATECTOMY N/A 06/28/2023   Procedure: XI ROBOTIC ASSISTED LAPAROSCOPIC RADICAL PROSTATECTOMY LEVEL 3;  Surgeon: Renda Glance, MD;  Location: [facility];  Service: Urology;  Laterality: N/A;  210 MINUTES NEEDED FOR CASE   SUBMUCOSAL LIFTING INJECTION  12/31/2021   Procedure: SUBMUCOSAL LIFTING INJECTION;  Surgeon: San Sandor GAILS, DO;  Location: [facility];  Service: Gastroenterology;;   THYROIDECTOMY N/A 10/02/2022   Procedure: TOTAL THYROIDECTOMY;  Surgeon: Eletha Boas, MD;  Location: [facility];  Service: General;  Laterality: N/A;     FAMILY HISTORY:  Family History  Problem Relation Age of Onset   Thyroid  disease Mother    Breast cancer Mother 40 - 80   Thyroid   cancer Father 50   Thyroid  disease Sister    Prostate cancer Brother 29   Bone cancer Brother 50       agent orange exposure   Prostate cancer Paternal Uncle 34   Kidney cancer Paternal Grandfather 73 - 38   Stomach cancer Neg Hx    Colon cancer Neg Hx    Esophageal cancer Neg Hx    Rectal cancer Neg Hx    Colon polyps Neg Hx      SOCIAL HISTORY:  reports that he quit smoking about 7 years ago. His smoking use included cigarettes. He started smoking about 47 years ago. He has a 20 pack-year smoking history. He has been exposed to tobacco smoke. He has never used smokeless tobacco. He reports current alcohol use. He reports that he does not use drugs.   ALLERGIES: Shellfish allergy   MEDICATIONS:  Current Outpatient Medications  Medication Sig Dispense Refill   albuterol  (VENTOLIN  HFA) 108 (90 Base) MCG/ACT inhaler Inhale 2 puffs into the lungs every 6 (six) hours as needed for wheezing or shortness of breath. 8 g 0   amLODipine  (NORVASC ) 5 MG tablet Take 1 tablet (5 mg total) by mouth daily. 90 tablet 0   atorvastatin  (LIPITOR) 80 MG tablet Take 1 tablet (80 mg total) by mouth daily. 90 tablet 3   fluticasone  (FLONASE ) 50 MCG/ACT nasal spray Place 2 sprays into both nostrils daily. 16 g 0   fluticasone  furoate-vilanterol (BREO ELLIPTA ) 100-25 MCG/ACT AEPB Inhale 1 puff into the lungs daily. 60 each 2   levothyroxine  (SYNTHROID ) 112 MCG tablet Take 1 tablet (112 mcg total) by mouth daily. 90 tablet 3   losartan  (COZAAR ) 100 MG tablet TAKE 1 TABLET BY MOUTH EVERY DAY 90 tablet 0   pantoprazole  (PROTONIX ) 40 MG tablet TAKE 1 TABLET BY MOUTH DAILY BEFORE BREAKFAST 90 tablet 0   sertraline  (ZOLOFT ) 25 MG tablet TAKE 1 TABLET (25 MG TOTAL) BY MOUTH DAILY. 90 tablet 0   tadalafil (CIALIS) 5 MG tablet Take 5 mg by mouth daily.     No current facility-administered medications for this encounter.     REVIEW OF SYSTEMS: On review of systems, the patient reports that he is doing well  overall.  He denies any chest pain, shortness of breath, cough, fevers, chills, night sweats, unintended weight changes.  He denies any bowel or bladder disturbances, and denies abdominal pain, nausea or vomiting.  He gets up typically once or twice a night to urinate.  He reports no burning with urination, or urgency.  He denies any new musculoskeletal or joint aches or pains. A complete review of systems is obtained and is otherwise negative.     PHYSICAL EXAM:  Wt Readings from Last 3 Encounters:  08/16/24 181 lb 11.2 oz (82.4 kg)  07/11/24 180 lb 6.4 oz (81.8 kg)  06/27/24 179 lb 3.2 oz (81.3 kg)   Temp Readings from Last 3 Encounters:  08/16/24 98.4 F (36.9 C) (Oral)  07/11/24 (!) 97.2 F (36.2 C)  03/14/24 98.6 F (37 C) (Oral)   BP Readings from Last 3 Encounters:  08/16/24 (!) 141/82  07/11/24 137/77  06/27/24 130/80   Pulse Readings from Last 3 Encounters:  08/16/24 64  07/11/24 72  06/27/24 72   Pain Assessment Pain Score: 0-No pain/10  In general this is a well appearing male in no acute distress.  He's alert and oriented x4 and appropriate throughout the examination. Cardiopulmonary assessment is negative for acute distress and he exhibits normal effort.     ECOG = 0  0 - Asymptomatic (Fully active, able to carry on all predisease activities without restriction)  1 - Symptomatic but completely ambulatory (Restricted in physically strenuous activity but ambulatory and able to carry out work of a light or sedentary nature. For example, light housework, office work)  2 - Symptomatic, <50% in bed during the day (Ambulatory and capable of all self care but unable to carry out any work activities. Up and about more than 50% of waking hours)  3 - Symptomatic, >50% in bed, but not bedbound (Capable of only limited self-care, confined to bed or chair 50% or more of waking hours)  4 - Bedbound (Completely disabled. Cannot carry on any self-care. Totally confined to bed  or chair)  5 - Death   Raylene MM, Creech RH, Tormey DC, et al. [phone]). Toxicity and response criteria of the [facility]. Am. DOROTHA Bridges. Oncol. 5 (6): 649-55      IMPRESSION/PLAN: 1.  The patient is a 68 year old male status post prostatectomy in 2024 for a Gleason 8 adenocarcinoma, now with rising PSA.  I again reviewed with him the rationale behind external beam radiation in his clinical scenario, as well as its potential side effects.  He expressed understanding, and consent was signed.  We will proceed with simulation and treatment planning.   In a visit lasting 25 minutes, greater than 50% of the time was spent face to face discussing the patient's condition, in preparation for the discussion, and coordinating the patient's care.    Amna Welker A. Jomarie, MD   **Disclaimer: This note was dictated with voice recognition software. Similar sounding words can inadvertently be transcribed and this note may contain transcription errors which may not have been corrected upon publication of note.**

## 2024-08-24 DIAGNOSIS — Z510 Encounter for antineoplastic radiation therapy: Secondary | ICD-10-CM | POA: Insufficient documentation

## 2024-08-24 DIAGNOSIS — C61 Malignant neoplasm of prostate: Secondary | ICD-10-CM | POA: Diagnosis not present

## 2024-08-28 ENCOUNTER — Ambulatory Visit
Admission: RE | Admit: 2024-08-28 | Discharge: 2024-08-28 | Disposition: A | Discharge status: Home / Self Care | Source: Ambulatory Visit | Attending: Radiation Oncology | Admitting: Radiation Oncology

## 2024-08-28 ENCOUNTER — Other Ambulatory Visit: Payer: Self-pay

## 2024-08-28 DIAGNOSIS — Z510 Encounter for antineoplastic radiation therapy: Secondary | ICD-10-CM | POA: Diagnosis not present

## 2024-08-28 LAB — RAD ONC ARIA SESSION SUMMARY
Course Elapsed Days: 0
Plan Fractions Treated to Date: 1
Plan Prescribed Dose Per Fraction: 1.8 Gy
Plan Total Fractions Prescribed: 25
Plan Total Prescribed Dose: 45 Gy
Reference Point Dosage Given to Date: 1.8 Gy
Reference Point Session Dosage Given: 1.8 Gy
Session Number: 1

## 2024-08-29 ENCOUNTER — Other Ambulatory Visit: Payer: Self-pay

## 2024-08-29 ENCOUNTER — Ambulatory Visit
Admission: RE | Admit: 2024-08-29 | Discharge: 2024-08-29 | Disposition: A | Discharge status: Home / Self Care | Source: Ambulatory Visit | Attending: Radiation Oncology | Admitting: Radiation Oncology

## 2024-08-29 DIAGNOSIS — Z510 Encounter for antineoplastic radiation therapy: Secondary | ICD-10-CM | POA: Diagnosis not present

## 2024-08-29 LAB — RAD ONC ARIA SESSION SUMMARY
Course Elapsed Days: 1
Plan Fractions Treated to Date: 2
Plan Prescribed Dose Per Fraction: 1.8 Gy
Plan Total Fractions Prescribed: 25
Plan Total Prescribed Dose: 45 Gy
Reference Point Dosage Given to Date: 3.6 Gy
Reference Point Session Dosage Given: 1.8 Gy
Session Number: 2

## 2024-08-30 ENCOUNTER — Ambulatory Visit
Admission: RE | Admit: 2024-08-30 | Discharge: 2024-08-30 | Disposition: A | Discharge status: Home / Self Care | Source: Ambulatory Visit | Attending: Radiation Oncology | Admitting: Radiation Oncology

## 2024-08-30 ENCOUNTER — Other Ambulatory Visit: Payer: Self-pay

## 2024-08-30 DIAGNOSIS — Z510 Encounter for antineoplastic radiation therapy: Secondary | ICD-10-CM | POA: Diagnosis not present

## 2024-08-30 LAB — RAD ONC ARIA SESSION SUMMARY
Course Elapsed Days: 2
Plan Fractions Treated to Date: 3
Plan Prescribed Dose Per Fraction: 1.8 Gy
Plan Total Fractions Prescribed: 25
Plan Total Prescribed Dose: 45 Gy
Reference Point Dosage Given to Date: 5.4 Gy
Reference Point Session Dosage Given: 1.8 Gy
Session Number: 3

## 2024-08-31 ENCOUNTER — Other Ambulatory Visit: Payer: Self-pay

## 2024-08-31 ENCOUNTER — Ambulatory Visit
Admission: RE | Admit: 2024-08-31 | Discharge: 2024-08-31 | Disposition: A | Discharge status: Home / Self Care | Source: Ambulatory Visit | Attending: Radiation Oncology | Admitting: Radiation Oncology

## 2024-08-31 DIAGNOSIS — Z510 Encounter for antineoplastic radiation therapy: Secondary | ICD-10-CM | POA: Diagnosis not present

## 2024-08-31 LAB — RAD ONC ARIA SESSION SUMMARY
Course Elapsed Days: 3
Plan Fractions Treated to Date: 4
Plan Prescribed Dose Per Fraction: 1.8 Gy
Plan Total Fractions Prescribed: 25
Plan Total Prescribed Dose: 45 Gy
Reference Point Dosage Given to Date: 7.2 Gy
Reference Point Session Dosage Given: 1.8 Gy
Session Number: 4

## 2024-09-01 ENCOUNTER — Ambulatory Visit

## 2024-09-03 ENCOUNTER — Other Ambulatory Visit: Payer: Self-pay | Admitting: Family

## 2024-09-03 DIAGNOSIS — F419 Anxiety disorder, unspecified: Secondary | ICD-10-CM

## 2024-09-04 ENCOUNTER — Other Ambulatory Visit: Payer: Self-pay

## 2024-09-04 ENCOUNTER — Other Ambulatory Visit: Payer: Self-pay | Admitting: Family Medicine

## 2024-09-04 ENCOUNTER — Ambulatory Visit
Admission: RE | Admit: 2024-09-04 | Discharge: 2024-09-04 | Disposition: A | Discharge status: Home / Self Care | Source: Ambulatory Visit | Attending: Radiation Oncology | Admitting: Radiation Oncology

## 2024-09-04 DIAGNOSIS — Z510 Encounter for antineoplastic radiation therapy: Secondary | ICD-10-CM | POA: Diagnosis not present

## 2024-09-04 DIAGNOSIS — I10 Essential (primary) hypertension: Secondary | ICD-10-CM

## 2024-09-04 LAB — RAD ONC ARIA SESSION SUMMARY
Course Elapsed Days: 7
Plan Fractions Treated to Date: 5
Plan Prescribed Dose Per Fraction: 1.8 Gy
Plan Total Fractions Prescribed: 25
Plan Total Prescribed Dose: 45 Gy
Reference Point Dosage Given to Date: 9 Gy
Reference Point Session Dosage Given: 1.8 Gy
Session Number: 5

## 2024-09-04 NOTE — Telephone Encounter (Signed)
 Complete

## 2024-09-05 ENCOUNTER — Other Ambulatory Visit: Payer: Self-pay

## 2024-09-05 ENCOUNTER — Ambulatory Visit
Admission: RE | Admit: 2024-09-05 | Discharge: 2024-09-05 | Disposition: A | Discharge status: Home / Self Care | Source: Ambulatory Visit | Attending: Radiation Oncology | Admitting: Radiation Oncology

## 2024-09-05 ENCOUNTER — Ambulatory Visit
Admission: RE | Admit: 2024-09-05 | Discharge: 2024-09-05 | Disposition: A | Discharge status: Home / Self Care | Source: Ambulatory Visit | Attending: Radiation Oncology

## 2024-09-05 DIAGNOSIS — Z510 Encounter for antineoplastic radiation therapy: Secondary | ICD-10-CM | POA: Diagnosis not present

## 2024-09-05 LAB — RAD ONC ARIA SESSION SUMMARY
Course Elapsed Days: 8
Plan Fractions Treated to Date: 6
Plan Prescribed Dose Per Fraction: 1.8 Gy
Plan Total Fractions Prescribed: 25
Plan Total Prescribed Dose: 45 Gy
Reference Point Dosage Given to Date: 10.8 Gy
Reference Point Session Dosage Given: 1.8 Gy
Session Number: 6

## 2024-09-06 ENCOUNTER — Ambulatory Visit
Admission: RE | Admit: 2024-09-06 | Discharge: 2024-09-06 | Disposition: A | Discharge status: Home / Self Care | Source: Ambulatory Visit | Attending: Radiation Oncology

## 2024-09-06 ENCOUNTER — Other Ambulatory Visit: Payer: Self-pay

## 2024-09-06 DIAGNOSIS — Z510 Encounter for antineoplastic radiation therapy: Secondary | ICD-10-CM | POA: Diagnosis not present

## 2024-09-06 LAB — RAD ONC ARIA SESSION SUMMARY
Course Elapsed Days: 9
Plan Fractions Treated to Date: 7
Plan Prescribed Dose Per Fraction: 1.8 Gy
Plan Total Fractions Prescribed: 25
Plan Total Prescribed Dose: 45 Gy
Reference Point Dosage Given to Date: 12.6 Gy
Reference Point Session Dosage Given: 1.8 Gy
Session Number: 7

## 2024-09-07 ENCOUNTER — Other Ambulatory Visit: Payer: Self-pay

## 2024-09-07 ENCOUNTER — Ambulatory Visit
Admission: RE | Admit: 2024-09-07 | Discharge: 2024-09-07 | Disposition: A | Discharge status: Home / Self Care | Source: Ambulatory Visit | Attending: Radiation Oncology | Admitting: Radiation Oncology

## 2024-09-07 DIAGNOSIS — Z510 Encounter for antineoplastic radiation therapy: Secondary | ICD-10-CM | POA: Diagnosis not present

## 2024-09-07 LAB — RAD ONC ARIA SESSION SUMMARY
Course Elapsed Days: 10
Plan Fractions Treated to Date: 8
Plan Prescribed Dose Per Fraction: 1.8 Gy
Plan Total Fractions Prescribed: 25
Plan Total Prescribed Dose: 45 Gy
Reference Point Dosage Given to Date: 14.4 Gy
Reference Point Session Dosage Given: 1.8 Gy
Session Number: 8

## 2024-09-08 ENCOUNTER — Other Ambulatory Visit: Payer: Self-pay

## 2024-09-08 ENCOUNTER — Ambulatory Visit
Admission: RE | Admit: 2024-09-08 | Discharge: 2024-09-08 | Disposition: A | Discharge status: Home / Self Care | Source: Ambulatory Visit | Attending: Radiation Oncology | Admitting: Radiation Oncology

## 2024-09-08 DIAGNOSIS — Z510 Encounter for antineoplastic radiation therapy: Secondary | ICD-10-CM | POA: Diagnosis not present

## 2024-09-08 LAB — RAD ONC ARIA SESSION SUMMARY
Course Elapsed Days: 11
Plan Fractions Treated to Date: 9
Plan Prescribed Dose Per Fraction: 1.8 Gy
Plan Total Fractions Prescribed: 25
Plan Total Prescribed Dose: 45 Gy
Reference Point Dosage Given to Date: 16.2 Gy
Reference Point Session Dosage Given: 1.8 Gy
Session Number: 9

## 2024-09-11 ENCOUNTER — Ambulatory Visit: Admitting: Radiation Oncology

## 2024-09-11 ENCOUNTER — Other Ambulatory Visit: Payer: Self-pay

## 2024-09-11 ENCOUNTER — Ambulatory Visit
Admission: RE | Admit: 2024-09-11 | Discharge: 2024-09-11 | Disposition: A | Discharge status: Home / Self Care | Source: Ambulatory Visit | Attending: Radiation Oncology | Admitting: Radiation Oncology

## 2024-09-11 DIAGNOSIS — Z510 Encounter for antineoplastic radiation therapy: Secondary | ICD-10-CM | POA: Diagnosis not present

## 2024-09-11 LAB — RAD ONC ARIA SESSION SUMMARY
Course Elapsed Days: 14
Plan Fractions Treated to Date: 10
Plan Prescribed Dose Per Fraction: 1.8 Gy
Plan Total Fractions Prescribed: 25
Plan Total Prescribed Dose: 45 Gy
Reference Point Dosage Given to Date: 18 Gy
Reference Point Session Dosage Given: 1.8 Gy
Session Number: 10

## 2024-09-12 ENCOUNTER — Other Ambulatory Visit: Payer: Self-pay

## 2024-09-12 ENCOUNTER — Ambulatory Visit
Admission: RE | Admit: 2024-09-12 | Discharge: 2024-09-12 | Disposition: A | Discharge status: Home / Self Care | Source: Ambulatory Visit | Attending: Radiation Oncology | Admitting: Radiation Oncology

## 2024-09-12 DIAGNOSIS — Z510 Encounter for antineoplastic radiation therapy: Secondary | ICD-10-CM | POA: Diagnosis not present

## 2024-09-12 LAB — RAD ONC ARIA SESSION SUMMARY
Course Elapsed Days: 15
Plan Fractions Treated to Date: 11
Plan Prescribed Dose Per Fraction: 1.8 Gy
Plan Total Fractions Prescribed: 25
Plan Total Prescribed Dose: 45 Gy
Reference Point Dosage Given to Date: 19.8 Gy
Reference Point Session Dosage Given: 1.8 Gy
Session Number: 11

## 2024-09-13 ENCOUNTER — Ambulatory Visit
Admission: RE | Admit: 2024-09-13 | Discharge: 2024-09-13 | Disposition: A | Discharge status: Home / Self Care | Source: Ambulatory Visit | Attending: Radiation Oncology

## 2024-09-13 ENCOUNTER — Other Ambulatory Visit: Payer: Self-pay

## 2024-09-13 DIAGNOSIS — Z510 Encounter for antineoplastic radiation therapy: Secondary | ICD-10-CM | POA: Diagnosis not present

## 2024-09-13 LAB — RAD ONC ARIA SESSION SUMMARY
Course Elapsed Days: 16
Plan Fractions Treated to Date: 12
Plan Prescribed Dose Per Fraction: 1.8 Gy
Plan Total Fractions Prescribed: 25
Plan Total Prescribed Dose: 45 Gy
Reference Point Dosage Given to Date: 21.6 Gy
Reference Point Session Dosage Given: 1.8 Gy
Session Number: 12

## 2024-09-14 ENCOUNTER — Other Ambulatory Visit: Payer: Self-pay

## 2024-09-14 ENCOUNTER — Ambulatory Visit
Admission: RE | Admit: 2024-09-14 | Discharge: 2024-09-14 | Disposition: A | Discharge status: Home / Self Care | Source: Ambulatory Visit | Attending: Radiation Oncology

## 2024-09-14 DIAGNOSIS — Z510 Encounter for antineoplastic radiation therapy: Secondary | ICD-10-CM | POA: Diagnosis not present

## 2024-09-14 LAB — RAD ONC ARIA SESSION SUMMARY
Course Elapsed Days: 17
Plan Fractions Treated to Date: 13
Plan Prescribed Dose Per Fraction: 1.8 Gy
Plan Total Fractions Prescribed: 25
Plan Total Prescribed Dose: 45 Gy
Reference Point Dosage Given to Date: 23.4 Gy
Reference Point Session Dosage Given: 1.8 Gy
Session Number: 13

## 2024-09-15 ENCOUNTER — Ambulatory Visit

## 2024-09-15 ENCOUNTER — Ambulatory Visit
Admission: RE | Admit: 2024-09-15 | Discharge: 2024-09-15 | Disposition: A | Discharge status: Home / Self Care | Source: Ambulatory Visit | Attending: Radiation Oncology | Admitting: Radiation Oncology

## 2024-09-15 ENCOUNTER — Other Ambulatory Visit: Payer: Self-pay

## 2024-09-15 DIAGNOSIS — Z510 Encounter for antineoplastic radiation therapy: Secondary | ICD-10-CM | POA: Diagnosis not present

## 2024-09-15 LAB — RAD ONC ARIA SESSION SUMMARY
Course Elapsed Days: 18
Plan Fractions Treated to Date: 14
Plan Prescribed Dose Per Fraction: 1.8 Gy
Plan Total Fractions Prescribed: 25
Plan Total Prescribed Dose: 45 Gy
Reference Point Dosage Given to Date: 25.2 Gy
Reference Point Session Dosage Given: 1.8 Gy
Session Number: 14

## 2024-09-18 ENCOUNTER — Other Ambulatory Visit: Payer: Self-pay

## 2024-09-18 ENCOUNTER — Ambulatory Visit: Admitting: Radiation Oncology

## 2024-09-18 ENCOUNTER — Ambulatory Visit
Admission: RE | Admit: 2024-09-18 | Discharge: 2024-09-18 | Disposition: A | Discharge status: Home / Self Care | Source: Ambulatory Visit | Attending: Radiation Oncology | Admitting: Radiation Oncology

## 2024-09-18 DIAGNOSIS — Z510 Encounter for antineoplastic radiation therapy: Secondary | ICD-10-CM | POA: Diagnosis not present

## 2024-09-18 LAB — RAD ONC ARIA SESSION SUMMARY
Course Elapsed Days: 21
Plan Fractions Treated to Date: 15
Plan Prescribed Dose Per Fraction: 1.8 Gy
Plan Total Fractions Prescribed: 25
Plan Total Prescribed Dose: 45 Gy
Reference Point Dosage Given to Date: 27 Gy
Reference Point Session Dosage Given: 1.8 Gy
Session Number: 15

## 2024-09-19 ENCOUNTER — Other Ambulatory Visit: Payer: Self-pay

## 2024-09-19 ENCOUNTER — Ambulatory Visit
Admission: RE | Admit: 2024-09-19 | Discharge: 2024-09-19 | Disposition: A | Discharge status: Home / Self Care | Source: Ambulatory Visit | Attending: Radiation Oncology | Admitting: Radiation Oncology

## 2024-09-19 DIAGNOSIS — Z510 Encounter for antineoplastic radiation therapy: Secondary | ICD-10-CM | POA: Diagnosis not present

## 2024-09-19 LAB — RAD ONC ARIA SESSION SUMMARY
Course Elapsed Days: 22
Plan Fractions Treated to Date: 16
Plan Prescribed Dose Per Fraction: 1.8 Gy
Plan Total Fractions Prescribed: 25
Plan Total Prescribed Dose: 45 Gy
Reference Point Dosage Given to Date: 28.8 Gy
Reference Point Session Dosage Given: 1.8 Gy
Session Number: 16

## 2024-09-20 ENCOUNTER — Other Ambulatory Visit

## 2024-09-20 ENCOUNTER — Other Ambulatory Visit: Payer: Self-pay

## 2024-09-20 ENCOUNTER — Ambulatory Visit
Admission: RE | Admit: 2024-09-20 | Discharge: 2024-09-20 | Disposition: A | Discharge status: Home / Self Care | Source: Ambulatory Visit | Attending: Radiation Oncology | Admitting: Radiation Oncology

## 2024-09-20 DIAGNOSIS — Z510 Encounter for antineoplastic radiation therapy: Secondary | ICD-10-CM | POA: Insufficient documentation

## 2024-09-20 DIAGNOSIS — C61 Malignant neoplasm of prostate: Secondary | ICD-10-CM | POA: Insufficient documentation

## 2024-09-20 LAB — RAD ONC ARIA SESSION SUMMARY
Course Elapsed Days: 23
Plan Fractions Treated to Date: 17
Plan Prescribed Dose Per Fraction: 1.8 Gy
Plan Total Fractions Prescribed: 25
Plan Total Prescribed Dose: 45 Gy
Reference Point Dosage Given to Date: 30.6 Gy
Reference Point Session Dosage Given: 1.8 Gy
Session Number: 17

## 2024-09-21 ENCOUNTER — Ambulatory Visit
Admission: RE | Admit: 2024-09-21 | Discharge: 2024-09-21 | Disposition: A | Source: Ambulatory Visit | Attending: Radiation Oncology

## 2024-09-21 ENCOUNTER — Inpatient Hospital Stay: Admission: RE | Admit: 2024-09-21 | Discharge: 2024-09-21 | Attending: Acute Care | Admitting: Acute Care

## 2024-09-21 ENCOUNTER — Other Ambulatory Visit: Payer: Self-pay

## 2024-09-21 DIAGNOSIS — Z87891 Personal history of nicotine dependence: Secondary | ICD-10-CM

## 2024-09-21 DIAGNOSIS — Z510 Encounter for antineoplastic radiation therapy: Secondary | ICD-10-CM | POA: Diagnosis not present

## 2024-09-21 DIAGNOSIS — Z122 Encounter for screening for malignant neoplasm of respiratory organs: Secondary | ICD-10-CM

## 2024-09-21 LAB — RAD ONC ARIA SESSION SUMMARY
Course Elapsed Days: 24
Plan Fractions Treated to Date: 18
Plan Prescribed Dose Per Fraction: 1.8 Gy
Plan Total Fractions Prescribed: 25
Plan Total Prescribed Dose: 45 Gy
Reference Point Dosage Given to Date: 32.4 Gy
Reference Point Session Dosage Given: 1.8 Gy
Session Number: 18

## 2024-09-22 ENCOUNTER — Other Ambulatory Visit: Payer: Self-pay

## 2024-09-22 ENCOUNTER — Ambulatory Visit
Admission: RE | Admit: 2024-09-22 | Discharge: 2024-09-22 | Disposition: A | Discharge status: Home / Self Care | Source: Ambulatory Visit | Attending: Radiation Oncology | Admitting: Radiation Oncology

## 2024-09-22 DIAGNOSIS — Z510 Encounter for antineoplastic radiation therapy: Secondary | ICD-10-CM | POA: Diagnosis not present

## 2024-09-22 LAB — RAD ONC ARIA SESSION SUMMARY
Course Elapsed Days: 25
Plan Fractions Treated to Date: 19
Plan Prescribed Dose Per Fraction: 1.8 Gy
Plan Total Fractions Prescribed: 25
Plan Total Prescribed Dose: 45 Gy
Reference Point Dosage Given to Date: 34.2 Gy
Reference Point Session Dosage Given: 1.8 Gy
Session Number: 19

## 2024-09-25 ENCOUNTER — Other Ambulatory Visit: Payer: Self-pay

## 2024-09-25 ENCOUNTER — Ambulatory Visit: Admitting: Radiation Oncology

## 2024-09-25 ENCOUNTER — Ambulatory Visit

## 2024-09-25 DIAGNOSIS — Z122 Encounter for screening for malignant neoplasm of respiratory organs: Secondary | ICD-10-CM

## 2024-09-25 DIAGNOSIS — Z87891 Personal history of nicotine dependence: Secondary | ICD-10-CM

## 2024-09-26 ENCOUNTER — Ambulatory Visit
Admission: RE | Admit: 2024-09-26 | Discharge: 2024-09-26 | Disposition: A | Source: Ambulatory Visit | Attending: Radiation Oncology | Admitting: Radiation Oncology

## 2024-09-26 ENCOUNTER — Ambulatory Visit
Admission: RE | Admit: 2024-09-26 | Discharge: 2024-09-26 | Disposition: A | Source: Ambulatory Visit | Attending: Radiation Oncology

## 2024-09-26 ENCOUNTER — Other Ambulatory Visit: Payer: Self-pay

## 2024-09-26 DIAGNOSIS — Z510 Encounter for antineoplastic radiation therapy: Secondary | ICD-10-CM | POA: Diagnosis not present

## 2024-09-26 LAB — RAD ONC ARIA SESSION SUMMARY
Course Elapsed Days: 29
Plan Fractions Treated to Date: 20
Plan Prescribed Dose Per Fraction: 1.8 Gy
Plan Total Fractions Prescribed: 25
Plan Total Prescribed Dose: 45 Gy
Reference Point Dosage Given to Date: 36 Gy
Reference Point Session Dosage Given: 1.8 Gy
Session Number: 20

## 2024-09-27 ENCOUNTER — Ambulatory Visit
Admission: RE | Admit: 2024-09-27 | Discharge: 2024-09-27 | Disposition: A | Discharge status: Home / Self Care | Source: Ambulatory Visit | Attending: Radiation Oncology | Admitting: Radiation Oncology

## 2024-09-27 ENCOUNTER — Other Ambulatory Visit: Payer: Self-pay

## 2024-09-27 DIAGNOSIS — Z510 Encounter for antineoplastic radiation therapy: Secondary | ICD-10-CM | POA: Diagnosis not present

## 2024-09-27 LAB — RAD ONC ARIA SESSION SUMMARY
Course Elapsed Days: 30
Plan Fractions Treated to Date: 21
Plan Prescribed Dose Per Fraction: 1.8 Gy
Plan Total Fractions Prescribed: 25
Plan Total Prescribed Dose: 45 Gy
Reference Point Dosage Given to Date: 37.8 Gy
Reference Point Session Dosage Given: 1.8 Gy
Session Number: 21

## 2024-09-27 LAB — LIPID PANEL

## 2024-09-28 ENCOUNTER — Other Ambulatory Visit: Payer: Self-pay

## 2024-09-28 ENCOUNTER — Ambulatory Visit
Admission: RE | Admit: 2024-09-28 | Discharge: 2024-09-28 | Disposition: A | Discharge status: Home / Self Care | Source: Ambulatory Visit | Attending: Radiation Oncology | Admitting: Radiation Oncology

## 2024-09-28 DIAGNOSIS — Z510 Encounter for antineoplastic radiation therapy: Secondary | ICD-10-CM | POA: Diagnosis not present

## 2024-09-28 LAB — HEPATIC FUNCTION PANEL
ALT: 50 IU/L — ABNORMAL HIGH (ref 0–44)
AST: 38 IU/L (ref 0–40)
Albumin: 4.5 g/dL (ref 3.9–4.9)
Alkaline Phosphatase: 93 IU/L (ref 47–123)
Bilirubin Total: 0.5 mg/dL (ref 0.0–1.2)
Bilirubin, Direct: 0.14 mg/dL (ref 0.00–0.40)
Total Protein: 7.2 g/dL (ref 6.0–8.5)

## 2024-09-28 LAB — RAD ONC ARIA SESSION SUMMARY
Course Elapsed Days: 31
Plan Fractions Treated to Date: 22
Plan Prescribed Dose Per Fraction: 1.8 Gy
Plan Total Fractions Prescribed: 25
Plan Total Prescribed Dose: 45 Gy
Reference Point Dosage Given to Date: 39.6 Gy
Reference Point Session Dosage Given: 1.8 Gy
Session Number: 22

## 2024-09-28 LAB — LIPID PANEL
Cholesterol, Total: 157 mg/dL (ref 100–199)
HDL: 45 mg/dL (ref >39)
LDL CALC COMMENT:: 3.5 ratio (ref 0.0–5.0)
LDL Chol Calc (NIH): 86 mg/dL (ref 0–99)
Triglycerides: 151 mg/dL — AB (ref 0–149)
VLDL Cholesterol Cal: 26 mg/dL (ref 5–40)

## 2024-09-29 ENCOUNTER — Ambulatory Visit
Admission: RE | Admit: 2024-09-29 | Discharge: 2024-09-29 | Disposition: A | Discharge status: Home / Self Care | Source: Ambulatory Visit | Attending: Radiation Oncology | Admitting: Radiation Oncology

## 2024-09-29 ENCOUNTER — Other Ambulatory Visit: Payer: Self-pay

## 2024-09-29 DIAGNOSIS — Z510 Encounter for antineoplastic radiation therapy: Secondary | ICD-10-CM | POA: Diagnosis not present

## 2024-09-29 LAB — RAD ONC ARIA SESSION SUMMARY
Course Elapsed Days: 32
Plan Fractions Treated to Date: 23
Plan Prescribed Dose Per Fraction: 1.8 Gy
Plan Total Fractions Prescribed: 25
Plan Total Prescribed Dose: 45 Gy
Reference Point Dosage Given to Date: 41.4 Gy
Reference Point Session Dosage Given: 1.8 Gy
Session Number: 23

## 2024-10-02 ENCOUNTER — Other Ambulatory Visit: Payer: Self-pay

## 2024-10-02 ENCOUNTER — Ambulatory Visit: Admitting: Radiation Oncology

## 2024-10-02 ENCOUNTER — Ambulatory Visit
Admission: RE | Admit: 2024-10-02 | Discharge: 2024-10-02 | Disposition: A | Discharge status: Home / Self Care | Source: Ambulatory Visit | Attending: Radiation Oncology | Admitting: Radiation Oncology

## 2024-10-02 DIAGNOSIS — Z510 Encounter for antineoplastic radiation therapy: Secondary | ICD-10-CM | POA: Diagnosis not present

## 2024-10-02 LAB — RAD ONC ARIA SESSION SUMMARY
Course Elapsed Days: 35
Plan Fractions Treated to Date: 24
Plan Prescribed Dose Per Fraction: 1.8 Gy
Plan Total Fractions Prescribed: 25
Plan Total Prescribed Dose: 45 Gy
Reference Point Dosage Given to Date: 43.2 Gy
Reference Point Session Dosage Given: 1.8 Gy
Session Number: 24

## 2024-10-03 ENCOUNTER — Ambulatory Visit
Admission: RE | Admit: 2024-10-03 | Discharge: 2024-10-03 | Disposition: A | Discharge status: Home / Self Care | Source: Ambulatory Visit | Attending: Radiation Oncology | Admitting: Radiation Oncology

## 2024-10-03 ENCOUNTER — Other Ambulatory Visit: Payer: Self-pay

## 2024-10-03 ENCOUNTER — Ambulatory Visit

## 2024-10-03 DIAGNOSIS — Z510 Encounter for antineoplastic radiation therapy: Secondary | ICD-10-CM | POA: Diagnosis not present

## 2024-10-03 LAB — RAD ONC ARIA SESSION SUMMARY
Course Elapsed Days: 36
Plan Fractions Treated to Date: 25
Plan Prescribed Dose Per Fraction: 1.8 Gy
Plan Total Fractions Prescribed: 25
Plan Total Prescribed Dose: 45 Gy
Reference Point Dosage Given to Date: 45 Gy
Reference Point Session Dosage Given: 1.8 Gy
Session Number: 25

## 2024-10-04 ENCOUNTER — Other Ambulatory Visit: Payer: Self-pay

## 2024-10-04 ENCOUNTER — Ambulatory Visit
Admission: RE | Admit: 2024-10-04 | Discharge: 2024-10-04 | Disposition: A | Source: Ambulatory Visit | Attending: Radiation Oncology

## 2024-10-04 DIAGNOSIS — Z510 Encounter for antineoplastic radiation therapy: Secondary | ICD-10-CM | POA: Diagnosis not present

## 2024-10-04 LAB — RAD ONC ARIA SESSION SUMMARY
Course Elapsed Days: 37
Plan Fractions Treated to Date: 1
Plan Prescribed Dose Per Fraction: 1.8 Gy
Plan Total Fractions Prescribed: 13
Plan Total Prescribed Dose: 23.4 Gy
Reference Point Dosage Given to Date: 1.8 Gy
Reference Point Session Dosage Given: 1.8 Gy
Session Number: 26

## 2024-10-05 ENCOUNTER — Other Ambulatory Visit: Payer: Self-pay

## 2024-10-05 ENCOUNTER — Ambulatory Visit
Admission: RE | Admit: 2024-10-05 | Discharge: 2024-10-05 | Disposition: A | Discharge status: Home / Self Care | Source: Ambulatory Visit | Attending: Radiation Oncology | Admitting: Radiation Oncology

## 2024-10-05 DIAGNOSIS — Z510 Encounter for antineoplastic radiation therapy: Secondary | ICD-10-CM | POA: Diagnosis not present

## 2024-10-05 LAB — RAD ONC ARIA SESSION SUMMARY
Course Elapsed Days: 38
Plan Fractions Treated to Date: 2
Plan Prescribed Dose Per Fraction: 1.8 Gy
Plan Total Fractions Prescribed: 13
Plan Total Prescribed Dose: 23.4 Gy
Reference Point Dosage Given to Date: 3.6 Gy
Reference Point Session Dosage Given: 1.8 Gy
Session Number: 27

## 2024-10-06 ENCOUNTER — Ambulatory Visit
Admission: RE | Admit: 2024-10-06 | Discharge: 2024-10-06 | Disposition: A | Discharge status: Home / Self Care | Source: Ambulatory Visit | Attending: Radiation Oncology | Admitting: Radiation Oncology

## 2024-10-06 ENCOUNTER — Other Ambulatory Visit: Payer: Self-pay

## 2024-10-06 DIAGNOSIS — Z510 Encounter for antineoplastic radiation therapy: Secondary | ICD-10-CM | POA: Diagnosis not present

## 2024-10-06 LAB — RAD ONC ARIA SESSION SUMMARY
Course Elapsed Days: 39
Plan Fractions Treated to Date: 3
Plan Prescribed Dose Per Fraction: 1.8 Gy
Plan Total Fractions Prescribed: 13
Plan Total Prescribed Dose: 23.4 Gy
Reference Point Dosage Given to Date: 5.4 Gy
Reference Point Session Dosage Given: 1.8 Gy
Session Number: 28

## 2024-10-09 ENCOUNTER — Ambulatory Visit
Admission: RE | Admit: 2024-10-09 | Discharge: 2024-10-09 | Disposition: A | Discharge status: Home / Self Care | Source: Ambulatory Visit | Attending: Radiation Oncology | Admitting: Radiation Oncology

## 2024-10-09 ENCOUNTER — Other Ambulatory Visit: Payer: Self-pay

## 2024-10-09 DIAGNOSIS — Z510 Encounter for antineoplastic radiation therapy: Secondary | ICD-10-CM | POA: Diagnosis not present

## 2024-10-09 LAB — RAD ONC ARIA SESSION SUMMARY
Course Elapsed Days: 42
Plan Fractions Treated to Date: 4
Plan Prescribed Dose Per Fraction: 1.8 Gy
Plan Total Fractions Prescribed: 13
Plan Total Prescribed Dose: 23.4 Gy
Reference Point Dosage Given to Date: 7.2 Gy
Reference Point Session Dosage Given: 1.8 Gy
Session Number: 29

## 2024-10-10 ENCOUNTER — Ambulatory Visit
Admission: RE | Admit: 2024-10-10 | Discharge: 2024-10-10 | Disposition: A | Source: Ambulatory Visit | Attending: Radiation Oncology | Admitting: Radiation Oncology

## 2024-10-10 ENCOUNTER — Ambulatory Visit
Admission: RE | Admit: 2024-10-10 | Discharge: 2024-10-10 | Disposition: A | Discharge status: Home / Self Care | Source: Ambulatory Visit | Attending: Radiation Oncology | Admitting: Radiation Oncology

## 2024-10-10 ENCOUNTER — Other Ambulatory Visit: Payer: Self-pay

## 2024-10-10 DIAGNOSIS — Z510 Encounter for antineoplastic radiation therapy: Secondary | ICD-10-CM | POA: Diagnosis not present

## 2024-10-10 LAB — RAD ONC ARIA SESSION SUMMARY
Course Elapsed Days: 43
Plan Fractions Treated to Date: 5
Plan Prescribed Dose Per Fraction: 1.8 Gy
Plan Total Fractions Prescribed: 13
Plan Total Prescribed Dose: 23.4 Gy
Reference Point Dosage Given to Date: 9 Gy
Reference Point Session Dosage Given: 1.8 Gy
Session Number: 30

## 2024-10-11 ENCOUNTER — Ambulatory Visit

## 2024-10-11 ENCOUNTER — Ambulatory Visit
Admission: RE | Admit: 2024-10-11 | Discharge: 2024-10-11 | Disposition: A | Discharge status: Home / Self Care | Source: Ambulatory Visit | Attending: Radiation Oncology | Admitting: Radiation Oncology

## 2024-10-11 ENCOUNTER — Other Ambulatory Visit: Payer: Self-pay

## 2024-10-11 DIAGNOSIS — Z510 Encounter for antineoplastic radiation therapy: Secondary | ICD-10-CM | POA: Diagnosis not present

## 2024-10-11 LAB — RAD ONC ARIA SESSION SUMMARY
Course Elapsed Days: 44
Plan Fractions Treated to Date: 6
Plan Prescribed Dose Per Fraction: 1.8 Gy
Plan Total Fractions Prescribed: 13
Plan Total Prescribed Dose: 23.4 Gy
Reference Point Dosage Given to Date: 10.8 Gy
Reference Point Session Dosage Given: 1.8 Gy
Session Number: 31

## 2024-10-12 ENCOUNTER — Other Ambulatory Visit: Payer: Self-pay

## 2024-10-12 ENCOUNTER — Ambulatory Visit
Admission: RE | Admit: 2024-10-12 | Discharge: 2024-10-12 | Disposition: A | Discharge status: Home / Self Care | Source: Ambulatory Visit | Attending: Radiation Oncology | Admitting: Radiation Oncology

## 2024-10-12 DIAGNOSIS — Z510 Encounter for antineoplastic radiation therapy: Secondary | ICD-10-CM | POA: Diagnosis not present

## 2024-10-12 LAB — RAD ONC ARIA SESSION SUMMARY
Course Elapsed Days: 45
Plan Fractions Treated to Date: 7
Plan Prescribed Dose Per Fraction: 1.8 Gy
Plan Total Fractions Prescribed: 13
Plan Total Prescribed Dose: 23.4 Gy
Reference Point Dosage Given to Date: 12.6 Gy
Reference Point Session Dosage Given: 1.8 Gy
Session Number: 32

## 2024-10-13 ENCOUNTER — Other Ambulatory Visit: Payer: Self-pay

## 2024-10-13 ENCOUNTER — Ambulatory Visit
Admission: RE | Admit: 2024-10-13 | Discharge: 2024-10-13 | Disposition: A | Discharge status: Home / Self Care | Source: Ambulatory Visit | Attending: Radiation Oncology | Admitting: Radiation Oncology

## 2024-10-13 DIAGNOSIS — Z510 Encounter for antineoplastic radiation therapy: Secondary | ICD-10-CM | POA: Diagnosis not present

## 2024-10-13 LAB — RAD ONC ARIA SESSION SUMMARY
Course Elapsed Days: 46
Plan Fractions Treated to Date: 8
Plan Prescribed Dose Per Fraction: 1.8 Gy
Plan Total Fractions Prescribed: 13
Plan Total Prescribed Dose: 23.4 Gy
Reference Point Dosage Given to Date: 14.4 Gy
Reference Point Session Dosage Given: 1.8 Gy
Session Number: 33

## 2024-10-16 ENCOUNTER — Other Ambulatory Visit: Payer: Self-pay

## 2024-10-16 ENCOUNTER — Ambulatory Visit
Admission: RE | Admit: 2024-10-16 | Discharge: 2024-10-16 | Disposition: A | Source: Ambulatory Visit | Attending: Radiation Oncology

## 2024-10-16 DIAGNOSIS — Z510 Encounter for antineoplastic radiation therapy: Secondary | ICD-10-CM | POA: Diagnosis not present

## 2024-10-16 LAB — RAD ONC ARIA SESSION SUMMARY
Course Elapsed Days: 49
Plan Fractions Treated to Date: 9
Plan Prescribed Dose Per Fraction: 1.8 Gy
Plan Total Fractions Prescribed: 13
Plan Total Prescribed Dose: 23.4 Gy
Reference Point Dosage Given to Date: 16.2 Gy
Reference Point Session Dosage Given: 1.8 Gy
Session Number: 34

## 2024-10-17 ENCOUNTER — Ambulatory Visit
Admission: RE | Admit: 2024-10-17 | Discharge: 2024-10-17 | Disposition: A | Source: Ambulatory Visit | Attending: Radiation Oncology

## 2024-10-17 ENCOUNTER — Other Ambulatory Visit: Payer: Self-pay

## 2024-10-17 ENCOUNTER — Ambulatory Visit
Admission: RE | Admit: 2024-10-17 | Discharge: 2024-10-17 | Disposition: A | Discharge status: Home / Self Care | Source: Ambulatory Visit | Attending: Radiation Oncology | Admitting: Radiation Oncology

## 2024-10-17 DIAGNOSIS — Z510 Encounter for antineoplastic radiation therapy: Secondary | ICD-10-CM | POA: Diagnosis not present

## 2024-10-17 LAB — RAD ONC ARIA SESSION SUMMARY
Course Elapsed Days: 50
Plan Fractions Treated to Date: 10
Plan Prescribed Dose Per Fraction: 1.8 Gy
Plan Total Fractions Prescribed: 13
Plan Total Prescribed Dose: 23.4 Gy
Reference Point Dosage Given to Date: 18 Gy
Reference Point Session Dosage Given: 1.8 Gy
Session Number: 35

## 2024-10-18 ENCOUNTER — Other Ambulatory Visit: Payer: Self-pay

## 2024-10-18 ENCOUNTER — Ambulatory Visit

## 2024-10-18 ENCOUNTER — Ambulatory Visit
Admission: RE | Admit: 2024-10-18 | Discharge: 2024-10-18 | Disposition: A | Discharge status: Home / Self Care | Source: Ambulatory Visit | Attending: Radiation Oncology | Admitting: Radiation Oncology

## 2024-10-18 DIAGNOSIS — Z510 Encounter for antineoplastic radiation therapy: Secondary | ICD-10-CM | POA: Diagnosis not present

## 2024-10-18 LAB — RAD ONC ARIA SESSION SUMMARY
Course Elapsed Days: 51
Plan Fractions Treated to Date: 11
Plan Prescribed Dose Per Fraction: 1.8 Gy
Plan Total Fractions Prescribed: 13
Plan Total Prescribed Dose: 23.4 Gy
Reference Point Dosage Given to Date: 19.8 Gy
Reference Point Session Dosage Given: 1.8 Gy
Session Number: 36

## 2024-10-19 ENCOUNTER — Other Ambulatory Visit: Payer: Self-pay

## 2024-10-19 ENCOUNTER — Ambulatory Visit
Admission: RE | Admit: 2024-10-19 | Discharge: 2024-10-19 | Disposition: A | Source: Ambulatory Visit | Attending: Radiation Oncology

## 2024-10-19 ENCOUNTER — Ambulatory Visit
Admission: RE | Admit: 2024-10-19 | Discharge: 2024-10-19 | Disposition: A | Source: Ambulatory Visit | Attending: Radiation Oncology | Admitting: Radiation Oncology

## 2024-10-19 ENCOUNTER — Ambulatory Visit

## 2024-10-19 DIAGNOSIS — Z510 Encounter for antineoplastic radiation therapy: Secondary | ICD-10-CM | POA: Diagnosis not present

## 2024-10-19 LAB — RAD ONC ARIA SESSION SUMMARY
Course Elapsed Days: 52
Plan Fractions Treated to Date: 12
Plan Prescribed Dose Per Fraction: 1.8 Gy
Plan Total Fractions Prescribed: 13
Plan Total Prescribed Dose: 23.4 Gy
Reference Point Dosage Given to Date: 21.6 Gy
Reference Point Session Dosage Given: 1.8 Gy
Session Number: 37

## 2024-10-20 ENCOUNTER — Ambulatory Visit
Admission: RE | Admit: 2024-10-20 | Discharge: 2024-10-20 | Disposition: A | Discharge status: Home / Self Care | Source: Ambulatory Visit | Attending: Radiation Oncology | Admitting: Radiation Oncology

## 2024-10-20 ENCOUNTER — Other Ambulatory Visit: Payer: Self-pay

## 2024-10-20 DIAGNOSIS — Z510 Encounter for antineoplastic radiation therapy: Secondary | ICD-10-CM | POA: Diagnosis not present

## 2024-10-20 LAB — RAD ONC ARIA SESSION SUMMARY
Course Elapsed Days: 53
Plan Fractions Treated to Date: 13
Plan Prescribed Dose Per Fraction: 1.8 Gy
Plan Total Fractions Prescribed: 13
Plan Total Prescribed Dose: 23.4 Gy
Reference Point Dosage Given to Date: 23.4 Gy
Reference Point Session Dosage Given: 1.8 Gy
Session Number: 38

## 2024-10-23 NOTE — Progress Notes (Signed)
 Patient was presented to the [facility] on 07/11/24 for his detectable rising PSA of 0.55 s/p radical prostatectomy and PLND.  Patient proceed with treatment recommendations of ST-ADT and IMRT and had his final radiation treatment on 10/20/24 at Dr. Fonda office.   Patient is scheduled for a 6 week post treatment folow up on 11/30/24 and has an urology follow up with Dr. Renda in February.   RN provided education on post treatment PSA monitoring.  All questions answered. No additional needs.  Patient knows to reach out with any questions that may arise.

## 2024-10-23 NOTE — Radiation Completion Notes (Addendum)
 Patient Name: Gregg [name], Gregg [name] MRN: [id number] Date of Birth: 30-Nov-1956 Referring Physician: GREIG DRONES, M.D. Date of Service: 2024-10-23 Radiation Oncologist: Lynwood Cedar, M.D. [facility]                             RADIATION ONCOLOGY END OF TREATMENT NOTE     Diagnosis: C61 Malignant neoplasm of prostate Staging on 2024-07-11: Malignant neoplasm of prostate (HCC) T=pT3a, N=pN0, M=cM0 Staging on 2023-03-31: Malignant neoplasm of prostate (HCC) T=cT2a, N=cN0, M=cM0 Intent: Curative     ==========DELIVERED PLANS==========  First Treatment Date: 2024-08-28 Last Treatment Date: 2024-10-20   Plan Name: ProstBed_Pel Site: Prostate Bed Technique: IMRT Mode: Photon Dose Per Fraction: 1.8 Gy Prescribed Dose (Delivered / Prescribed): 45 Gy / 45 Gy Prescribed Fxs (Delivered / Prescribed): 25 / 25   Plan Name: ProstBed_Bst Site: Prostate Bed Technique: IMRT Mode: Photon Dose Per Fraction: 1.8 Gy Prescribed Dose (Delivered / Prescribed): 23.4 Gy / 23.4 Gy Prescribed Fxs (Delivered / Prescribed): 13 / 13     ==========ON TREATMENT VISIT DATES========== 2024-08-29, 2024-09-05, 2024-09-12, 2024-09-19, 2024-09-26, 2024-10-03, 2024-10-10, 2024-10-17, 2024-10-19     ==========UPCOMING VISITS========== 12/05/2024 [facility] OFF[facility] VISIT Court Dorn PARAS, MD  11/30/2024 [facility] ONC FOLLOW UP 20 Cedar Lynwood, MD  11/08/2024 [facility] VISIT Geraldine Setter D, RPH-CPP        ==========APPENDIX - ON TREATMENT VISIT NOTES==========   See weekly On Treatment Notes in Epic for details in the Media tab (listed as Progress notes on the On Treatment Visit Dates listed above).

## 2024-11-07 NOTE — Progress Notes (Unsigned)
 Patient ID: Gregg [name]                 DOB: 18-Sep-1956                    MRN: [id number]    HPI: Gregg [name] is a 68 y.o. male patient referred to lipid clinic by  Dr. Court. PMH is significant for hyperthyroidism with total thyroidectomy, HTN, prostate cancer, and HLD. He has recently underwent cancer treatment, last treatment on 10/20/24.   At cardio visit on 06/27/2024, he was reported to have a CAC of 655 distributed to all 3 coronary arteries. His LDL-C at the time was 100. His atorvastatin  was increased to 80 mg daily, from 40 mg. His LDL-C on 09/27/24 was 86. After three months on atorvastatin  80 mg, he was not at his goal of LDL-C < 70.   Patient presents today to discuss initiating a PCSK9i. The patient explained that he has just finished up radiation treatments and has taken an oral cancer therapy that is causing him to have hot flashes. The patient is hesitant to try any other medications at this time. The patients LDL level and CAC score were explained to him. The patient had questions about what his CAC score meant and what the issues were that come with hard plaque build up in his arteries. The patient also had questions about his diagnosis of a right bundle branch block meant and this was explained to him. The patient was hesitant to consider an injection, but he was more agreeable when he realized he could administer the injection at home. He asked if there was any other medications he could take to lower his LDL. We discussed ezetimibe as an option, but informed the patient that this was not as effective as Repatha. We discussed the mechanism of action, side effects, administration technique, cost, and potential cost assistance for Repatha. The patient was not ready to make a decision at this visit. He wishes to speak with his partner and friends before agreeing to start Repatha.   Current Medications: atorvastatin  80 mg Intolerances: None Risk Factors: CAC score, family  history, HTN LDL-C goal: <70  Diet:  Breakfast: rarely eats, has been trying intermittent fasting.  Lunch: Soup (homemade), chicken salad, fish if he eats out.  Dinner: Similar to lunch (chicken) at least one vegetable  Does not salt his food, does not eat fried foods Snack: Chocolate, vanilla oreos, little debbies, ice cream   Exercise:  Works in his yard, has 1.2 acres  Has some at home weights at that he uses occasionally while watching TV.  Family History:  Father: heart disease  Social History:  Former smoker (quit 7 years ago) Rarely drinks alcohol, occasional beer with Mexican food  Labs: Lipid Panel     Component Value Date/Time   CHOL 157 09/27/2024 1106   TRIG 151 (H) 09/27/2024 1106   HDL 45 09/27/2024 1106   CHOLHDL 3.5 09/27/2024 1106   LDLCALC 86 09/27/2024 1106   LABVLDL 26 09/27/2024 1106    Past Medical History:  Diagnosis Date   Anxiety    Arthritis    Asthma    Bilateral inguinal hernia 06/02/2021   Cancer (HCC) 04/05/2023   Prostrate CA early   Chronic kidney disease    Elevated PSA    GERD (gastroesophageal reflux disease)    Hemorrhoids, internal    History of kidney stones 2020   Hyperlipidemia    Hypertension  Right BBB/    Sinus complaint 06/02/2021   x 1 week per pt   Thyroid  disease    Wears glasses 06/02/2021    Current Outpatient Medications on File Prior to Visit  Medication Sig Dispense Refill   albuterol  (VENTOLIN  HFA) 108 (90 Base) MCG/ACT inhaler Inhale 2 puffs into the lungs every 6 (six) hours as needed for wheezing or shortness of breath. 8 g 0   amLODipine  (NORVASC ) 5 MG tablet TAKE 1 TABLET (5 MG TOTAL) BY MOUTH DAILY. 90 tablet 0   atorvastatin  (LIPITOR) 80 MG tablet Take 1 tablet (80 mg total) by mouth daily. 90 tablet 3   co-enzyme Q-10 30 MG capsule Take 200 mg by mouth daily.     fluticasone  (FLONASE ) 50 MCG/ACT nasal spray Place 2 sprays into both nostrils daily. (Patient taking differently: Place 2 sprays  into both nostrils daily as needed.) 16 g 0   fluticasone  furoate-vilanterol (BREO ELLIPTA ) 100-25 MCG/ACT AEPB Inhale 1 puff into the lungs daily. 60 each 2   levothyroxine  (SYNTHROID ) 112 MCG tablet Take 1 tablet (112 mcg total) by mouth daily. 90 tablet 3   losartan  (COZAAR ) 100 MG tablet TAKE 1 TABLET BY MOUTH EVERY DAY 90 tablet 0   pantoprazole  (PROTONIX ) 40 MG tablet TAKE 1 TABLET BY MOUTH DAILY BEFORE BREAKFAST (Patient taking differently: Take 40 mg by mouth daily as needed.) 90 tablet 0   sertraline  (ZOLOFT ) 25 MG tablet TAKE 1 TABLET (25 MG TOTAL) BY MOUTH DAILY. 90 tablet 0   tadalafil (CIALIS) 5 MG tablet Take 5 mg by mouth daily.     No current facility-administered medications on file prior to visit.    Allergies  Allergen Reactions   Shellfish Allergy Anaphylaxis and Swelling    Assessment/Plan:  1.  Hyperlipidemia Assessment: Uncontrolled LDL goal < 70  -The patient saw an LDL reduction after an increasing his atorvastatin  to 80 mg daily, but is still not at goal with a LDL of 86.  -The patient eats high fat and high calorie snacks daily.  -The patient is unsure about starting another medication at this time.   Plan:  -Swap current snacks for more heart healthy options.  -Send Repatha to insurance for PA to assess cost and patient assistance options.  -The patient will do research, speak with his partner and friends and make a decision on if he wishes to start Repatha.   Follow-up: -We will follow up with information after the PA is complete. The patient will reach out with any questions.   Thank you, [name], PharmD Candidate  [name]    [name], Pharm.[name], CPP [name] [address], [name], [name]  Phone: [phone]; Fax: [phone]

## 2024-11-08 ENCOUNTER — Encounter: Payer: Self-pay | Admitting: Pharmacist

## 2024-11-08 ENCOUNTER — Ambulatory Visit: Attending: Cardiovascular Disease | Admitting: Pharmacist

## 2024-11-08 DIAGNOSIS — E782 Mixed hyperlipidemia: Secondary | ICD-10-CM

## 2024-11-08 NOTE — Assessment & Plan Note (Signed)
 Assessment: Uncontrolled LDL goal < 70  -The patient saw an LDL reduction after an increasing his atorvastatin  to 80 mg daily, but is still not at goal with a LDL of 86.  -The patient eats high fat and high calorie snacks daily.  -The patient is unsure about starting another medication at this time.   Plan:  -Swap current snacks for more heart healthy options.  -Send Repatha  to insurance for PA to assess cost and patient assistance options.  -The patient will do research, speak with his partner and friends and make a decision on if he wishes to start Repatha .   Follow-up: -We will follow up with information after the PA is complete. The patient will reach out with any questions.

## 2024-11-08 NOTE — Patient Instructions (Signed)

## 2024-11-09 ENCOUNTER — Telehealth (HOSPITAL_COMMUNITY): Payer: Self-pay

## 2024-11-09 ENCOUNTER — Other Ambulatory Visit (HOSPITAL_COMMUNITY): Payer: Self-pay

## 2024-11-09 NOTE — Telephone Encounter (Signed)
-----   Message from [name] sent at 11/08/2024  3:38 PM EST ----- Please do PA for Repatha. Will also need healthwell and to be placed in WAM and marked delivery

## 2024-11-10 ENCOUNTER — Other Ambulatory Visit (HOSPITAL_COMMUNITY): Payer: Self-pay

## 2024-11-10 NOTE — Telephone Encounter (Signed)
 Pharmacy Patient Advocate Encounter  Received notification from [facility] [employer] that Prior Authorization for REPATHA  has been APPROVED from 11/09/24 to 05/08/25. Ran test claim, Copay is $47. This test claim was processed through [facility]- copay amounts may vary at other pharmacies due to pharmacy/plan contracts, or as the patient moves through the different stages of their insurance plan.

## 2024-11-13 ENCOUNTER — Other Ambulatory Visit (HOSPITAL_COMMUNITY): Payer: Self-pay

## 2024-11-13 ENCOUNTER — Telehealth: Payer: Self-pay | Admitting: Pharmacy Technician

## 2024-11-13 DIAGNOSIS — E782 Mixed hyperlipidemia: Secondary | ICD-10-CM

## 2024-11-13 NOTE — Telephone Encounter (Signed)
  Repatha   Patient Advocate Encounter   The patient was approved for a [employer] grant that will help cover the cost of repatha  Total amount awarded, 2500.  Effective: 10/14/24 - 10/13/25   APW:[id number] ERW:[id number] Hmnle:[id number] PI:[id number] [employer] ID: [id number]   Pharmacy provided with approval and processing information. Patient informed via rph

## 2024-11-13 NOTE — Telephone Encounter (Signed)
 Please get healthwell grant and put in Miranda. Thanks

## 2024-11-14 ENCOUNTER — Encounter: Payer: Self-pay | Admitting: Pharmacist

## 2024-11-14 ENCOUNTER — Other Ambulatory Visit (HOSPITAL_COMMUNITY): Payer: Self-pay

## 2024-11-14 MED ORDER — REPATHA SURECLICK 140 MG/ML ~~LOC~~ SOAJ
1.0000 mL | SUBCUTANEOUS | 3 refills | Status: AC
  Filled 2024-11-14: qty 6, 84d supply, fill #0

## 2024-11-24 ENCOUNTER — Other Ambulatory Visit: Payer: Self-pay | Admitting: Family

## 2024-11-24 DIAGNOSIS — I10 Essential (primary) hypertension: Secondary | ICD-10-CM

## 2024-11-24 NOTE — Telephone Encounter (Signed)
 Pt scheduled

## 2024-11-24 NOTE — Telephone Encounter (Signed)
 Complete. Schedule appointment. Last office visit 03/14/2024.

## 2024-11-26 ENCOUNTER — Other Ambulatory Visit: Payer: Self-pay | Admitting: Family

## 2024-11-26 DIAGNOSIS — F32A Depression, unspecified: Secondary | ICD-10-CM

## 2024-11-27 NOTE — Telephone Encounter (Signed)
 Complete

## 2024-11-30 ENCOUNTER — Ambulatory Visit
Admission: RE | Admit: 2024-11-30 | Discharge: 2024-11-30 | Disposition: A | Discharge status: Home / Self Care | Source: Ambulatory Visit | Attending: Radiation Oncology | Admitting: Radiation Oncology

## 2024-11-30 VITALS — BP 126/74 | HR 76 | Temp 98.4°F | Resp 16 | Ht 69.0 in | Wt 183.6 lb

## 2024-11-30 DIAGNOSIS — C61 Malignant neoplasm of prostate: Secondary | ICD-10-CM

## 2024-11-30 NOTE — Progress Notes (Signed)
 Department of Radiation Oncology    Followup Note    Name: Gregg [name] Date: 11/30/2024 MRN: [id number] DOB: 1956-10-27   Diagnosis: Rising PSA, post prostatectomy    MEDICATIONS: Current Outpatient Medications  Medication Sig Dispense Refill   albuterol  (VENTOLIN  HFA) 108 (90 Base) MCG/ACT inhaler Inhale 2 puffs into the lungs every 6 (six) hours as needed for wheezing or shortness of breath. 8 g 0   amLODipine  (NORVASC ) 5 MG tablet TAKE 1 TABLET (5 MG TOTAL) BY MOUTH DAILY. 90 tablet 0   atorvastatin  (LIPITOR) 80 MG tablet Take 1 tablet (80 mg total) by mouth daily. 90 tablet 3   co-enzyme Q-10 30 MG capsule Take 200 mg by mouth daily.     Evolocumab  (REPATHA  SURECLICK) 140 MG/ML SOAJ Inject 140 mg into the skin every 14 (fourteen) days. Please deliver, please apply healthwell grant 6 mL 3   fluticasone  (FLONASE ) 50 MCG/ACT nasal spray Place 2 sprays into both nostrils daily. (Patient taking differently: Place 2 sprays into both nostrils daily as needed.) 16 g 0   fluticasone  furoate-vilanterol (BREO ELLIPTA ) 100-25 MCG/ACT AEPB Inhale 1 puff into the lungs daily. 60 each 2   levothyroxine  (SYNTHROID ) 112 MCG tablet Take 1 tablet (112 mcg total) by mouth daily. 90 tablet 3   losartan  (COZAAR ) 100 MG tablet TAKE 1 TABLET BY MOUTH EVERY DAY 90 tablet 0   pantoprazole  (PROTONIX ) 40 MG tablet TAKE 1 TABLET BY MOUTH DAILY BEFORE BREAKFAST (Patient taking differently: Take 40 mg by mouth daily as needed.) 90 tablet 0   sertraline  (ZOLOFT ) 25 MG tablet TAKE 1 TABLET (25 MG TOTAL) BY MOUTH DAILY. 90 tablet 0   tadalafil (CIALIS) 5 MG tablet Take 5 mg by mouth daily.     No current facility-administered medications for this encounter.     ALLERGIES: Shellfish allergy   LABORATORY DATA:  Lab Results  Component Value Date   WBC 6.9 10/05/2023   HGB 15.5 10/05/2023   HCT 46.3 10/05/2023   MCV 92.2 10/05/2023   PLT 253.0 10/05/2023   Lab Results  Component Value Date    NA 140 03/14/2024   K 4.4 03/14/2024   CL 101 03/14/2024   CO2 25 03/14/2024   Lab Results  Component Value Date   ALT 50 (H) 09/27/2024   AST 38 09/27/2024   ALKPHOS 93 09/27/2024   BILITOT 0.5 09/27/2024     NARRATIVE: Gregg [name] was seen today in followup.  He completed external beam radiation at the end of October.  He continues to receive androgen deprivation therapy under the care of Dr. Renda.  He states that he is doing well overall.  He continues to be bothered by daily hot flashes.  He typically gets up 3 times a night to urinate.  He is no longer taking Flomax .  His energy level is reasonably good.  He continues to note occasional loose stools    PHYSICAL EXAMINATION: height is 5' 9 (1.753 m) and weight is 183 lb 9.6 oz (83.3 kg). His oral temperature is 98.4 F (36.9 C). His blood pressure is 126/74 and his pulse is 76. His respiration is 16 and oxygen saturation is 99%.      His abdomen is nontender and nondistended.  ASSESSMENT: The patient is doing reasonably well approximately 6 weeks out from completion of external beam radiation.  His treatment related side effects continue to resolve.  PLAN: He is scheduled to see Dr. Renda in follow-up in February of  next year.  As he will continue to follow-up routinely with Dr. Renda, further follow-up in our department will be on a as needed basis, though I encouraged him to contact me at any time with any questions or concerns he may have.    Gregg [name] A. Jomarie, MD

## 2024-12-05 ENCOUNTER — Encounter: Payer: Self-pay | Admitting: Cardiovascular Disease

## 2024-12-05 ENCOUNTER — Ambulatory Visit: Attending: Cardiovascular Disease | Admitting: Cardiovascular Disease

## 2024-12-05 VITALS — BP 112/62 | HR 71 | Ht 69.0 in | Wt 178.0 lb

## 2024-12-05 DIAGNOSIS — Z87891 Personal history of nicotine dependence: Secondary | ICD-10-CM

## 2024-12-05 DIAGNOSIS — I10 Essential (primary) hypertension: Secondary | ICD-10-CM

## 2024-12-05 DIAGNOSIS — R931 Abnormal findings on diagnostic imaging of heart and coronary circulation: Secondary | ICD-10-CM

## 2024-12-05 DIAGNOSIS — E782 Mixed hyperlipidemia: Secondary | ICD-10-CM

## 2024-12-05 DIAGNOSIS — I2510 Atherosclerotic heart disease of native coronary artery without angina pectoris: Secondary | ICD-10-CM | POA: Insufficient documentation

## 2024-12-05 DIAGNOSIS — I4510 Unspecified right bundle-branch block: Secondary | ICD-10-CM

## 2024-12-05 NOTE — Assessment & Plan Note (Signed)
 Chronic

## 2024-12-05 NOTE — Assessment & Plan Note (Signed)
 Remote tobacco abuse having smoked 15 to 20 pack years and quit 7-8 years ago.

## 2024-12-05 NOTE — Assessment & Plan Note (Signed)
 History of hyperlipidemia on high-dose statin therapy and recently started Repatha .  His most recent lipid profile performed 09/27/2024 revealed total cholesterol 157, LDL of 86 and HDL 45, not at goal for secondary prevention given his significantly elevated coronary calcium  score.  He has only taken 2 doses of Repatha .  I am going to recheck his lipid profile in February with LDL goal less than 70.

## 2024-12-05 NOTE — Assessment & Plan Note (Signed)
 History of essential hypertension blood pressure measured today at 112/62.  He is on amlodipine  and losartan .

## 2024-12-05 NOTE — Patient Instructions (Addendum)
 Medication Instructions:  Your physician recommends that you continue on your current medications as directed. Please refer to the Current Medication list given to you today.  *If you need a refill on your cardiac medications before your next appointment, please call your pharmacy*  Lab Work: Your physician recommends that you return for lab work in: February for FASTING lipid panel  If you have labs (blood work) drawn today and your tests are completely normal, you will receive your results only by: [employer] (if you have MyChart) OR A paper copy in the mail If you have any lab test that is abnormal or we need to change your treatment, we will call you to review the results.    Follow-Up: At [facility], you and your health needs are our priority.  As part of our continuing mission to provide you with exceptional heart care, our providers are all part of one team.  This team includes your primary Cardiologist (physician) and Advanced Practice Providers or APPs (Physician Assistants and Nurse Practitioners) who all work together to provide you with the care you need, when you need it.  Your next appointment:   12 month(s)  Provider:   Dorn Lesches, MD

## 2024-12-05 NOTE — Assessment & Plan Note (Signed)
 History of CAD by coronary calcium  score performed 05/16/2024 which was 655 distributed in all 3 coronary arteries but most notably in the LAD.  He denies chest pain.  Based on this I did do a Myoview  stress test on him 08/08/2024 which is low risk and nonischemic.  We are focusing on risk factor modification attempting to get his LDL less than 70.

## 2024-12-05 NOTE — Progress Notes (Signed)
 12/05/2024 [name]   [birth year]  [id number]  Primary Physician Jaycee Greig PARAS, NP Primary Cardiologist: Dorn PARAS Lesches MD [place], [place]  HPI:  Gregg [name] is a 68 y.o.    fit appearing single Caucasian male with no children referred to me by Greig Drones NP for evaluation of hypertension and atypical chest pain.  I last saw him in the office 06/27/2024.  He has seen Dr. Ronal Ross in the past for palpitations.  Event monitor showed only benign PVCs.  He does have a history of hypothyroidism status post total thyroidectomy on Synthroid  replacement therapy.  His risk factors otherwise include history of treated hypertension and hyperlipidemia.  He has 15-20-pack-year tobacco abuse having quit 7 years ago.  His father did have bypass surgery at age 37.  He is never had a heart attack or stroke.  He gets occasional atypical chest pain which is right-sided and fairly focal with some upper extremity numbness which sounds positional.  He also complains of episodic dyspnea on exertion.  He is fairly active and mows his lawn and weed eats.  His PCP recently increase his losartan  and added amlodipine  for elevated blood pressure.  He does have a blood pressure cuff at home which he uses on a daily basis.   He had a 2D echo performed 04/18/2024 that revealed normal LV systolic function, grade 1 diastolic dysfunction without valvular abnormalities.  A coronary calcium  score performed 05/16/2024 was 655 distributed in all 3 coronary arteries the majority of which was in the LAD.  Following this I did did a Myoview  stress test 08/08/2024 which is low risk and nonischemic.  Since I saw him 6 months ago he remained stable.  He is still has remained active during nonmotor months and denies chest pain or shortness of breath.  He recently started Repatha  and is taking the second dose which she says is associated with some flulike symptoms.   Active Medications[1]    Allergies[2]  Social History   Socioeconomic History   Marital status: Significant Other    Spouse name: Mr Gregg [name]   Number of children: 0   Years of education: Not on file   Highest education level: Bachelor's degree (e.g., BA, AB, BS)  Occupational History   Occupation: [employer]  Tobacco Use   Smoking status: Former    Current packs/day: 0.00    Average packs/day: 0.5 packs/day for 40.0 years (20.0 ttl pk-yrs)    Types: Cigarettes    Start date: 03/29/1977    Quit date: 03/29/2017    Years since quitting: 7.6    Passive exposure: Past   Smokeless tobacco: Never  Vaping Use   Vaping status: Never Used  Substance and Sexual Activity   Alcohol use: Yes    Comment: rare   Drug use: Never   Sexual activity: Not on file  Other Topics Concern   Not on file  Social History Narrative   Not on file   Social Drivers of Health   Tobacco Use: Medium Risk (08/16/2024)   Patient History    Smoking Tobacco Use: Former    Smokeless Tobacco Use: Never    Passive Exposure: Past  Physicist, Medical Strain: Low Risk (02/10/2024)   Overall Financial Resource Strain (CARDIA)    Difficulty of Paying Living Expenses: Not hard at all  Food Insecurity: No Food Insecurity (08/16/2024)   Epic    Worried About Radiation Protection Practitioner of Food in the  Last Year: Never true    Ran Out of Food in the Last Year: Never true  Transportation Needs: No Transportation Needs (08/16/2024)   Epic    Lack of Transportation (Medical): No    Lack of Transportation (Non-Medical): No  Physical Activity: Insufficiently Active (02/10/2024)   Exercise Vital Sign    Days of Exercise per Week: 4 days    Minutes of Exercise per Session: 20 min  Stress: No Stress Concern Present (08/16/2024)   [employer] of Occupational Health - Occupational Stress Questionnaire    Feeling of Stress: Only a little  Social Connections: Moderately Integrated (02/10/2024)   Social Connection and Isolation Panel     Frequency of Communication with Friends and Family: More than three times a week    Frequency of Social Gatherings with Friends and Family: Twice a week    Attends Religious Services: More than 4 times per year    Active Member of Clubs or Organizations: Yes    Attends Banker Meetings: More than 4 times per year    Marital Status: Never married  Intimate Partner Violence: Not At Risk (08/16/2024)   Epic    Fear of Current or Ex-Partner: No    Emotionally Abused: No    Physically Abused: No    Sexually Abused: No  Depression (PHQ2-9): Low Risk (11/30/2024)   Depression (PHQ2-9)    PHQ-2 Score: 0  Alcohol Screen: Low Risk (08/16/2024)   Alcohol Screen    Last Alcohol Screening Score (AUDIT): 0  Housing: Unknown (08/16/2024)   Epic    Unable to Pay for Housing in the Last Year: No    Number of Times Moved in the Last Year: Not on file    Homeless in the Last Year: No  Utilities: Not At Risk (08/16/2024)   Epic    Threatened with loss of utilities: No  Health Literacy: Adequate Health Literacy (02/14/2024)   B1300 Health Literacy    Frequency of need for help with medical instructions: Never     Review of Systems: General: negative for chills, fever, night sweats or weight changes.  Cardiovascular: negative for chest pain, dyspnea on exertion, edema, orthopnea, palpitations, paroxysmal nocturnal dyspnea or shortness of breath Dermatological: negative for rash Respiratory: negative for cough or wheezing Urologic: negative for hematuria Abdominal: negative for nausea, vomiting, diarrhea, bright red blood per rectum, melena, or hematemesis Neurologic: negative for visual changes, syncope, or dizziness All other systems reviewed and are otherwise negative except as noted above.    Blood pressure 112/62, pulse 71, height 5' 9 (1.753 m), weight 178 lb (80.7 kg), SpO2 96%.  General appearance: alert and no distress Neck: no adenopathy, no carotid bruit, no JVD, supple,  symmetrical, trachea midline, and thyroid  not enlarged, symmetric, no tenderness/mass/nodules Lungs: clear to auscultation bilaterally Heart: regular rate and rhythm, S1, S2 normal, no murmur, click, rub or gallop Extremities: extremities normal, atraumatic, no cyanosis or edema Pulses: 2+ and symmetric Skin: Skin color, texture, turgor normal. No rashes or lesions Neurologic: Grossly normal  EKG EKG Interpretation Date/Time:  Tuesday December 05 2024 08:36:51 EST Ventricular Rate:  71 PR Interval:  146 QRS Duration:  122 QT Interval:  422 QTC Calculation: 458 R Axis:   66  Text Interpretation: Normal sinus rhythm Right bundle branch block When compared with ECG of 28-Mar-2024 08:17, Left anterior fascicular block is no longer Present Criteria for Lateral infarct are no longer Present Criteria for Inferior infarct are no longer Present Confirmed by  Court Carrier [phone]) on 12/05/2024 9:02:20 AM    ASSESSMENT AND PLAN:   Essential hypertension History of essential hypertension blood pressure measured today at 112/62.  He is on amlodipine  and losartan .  Hyperlipidemia History of hyperlipidemia on high-dose statin therapy and recently started Repatha .  His most recent lipid profile performed 09/27/2024 revealed total cholesterol 157, LDL of 86 and HDL 45, not at goal for secondary prevention given his significantly elevated coronary calcium  score.  He has only taken 2 doses of Repatha .  I am going to recheck his lipid profile in February with LDL goal less than 70.  Former smoker Remote tobacco abuse having smoked 15 to 20 pack years and quit 7-8 years ago.  Right bundle branch block Chronic  Coronary artery disease History of CAD by coronary calcium  score performed 05/16/2024 which was 655 distributed in all 3 coronary arteries but most notably in the LAD.  He denies chest pain.  Based on this I did do a Myoview  stress test on him 08/08/2024 which is low risk and nonischemic.  We are  focusing on risk factor modification attempting to get his LDL less than 70.     Carrier DOROTHA Court MD FACP,FACC,FAHA, FSCAI 12/05/2024 9:12 AM    [1]  Current Meds  Medication Sig   albuterol  (VENTOLIN  HFA) 108 (90 Base) MCG/ACT inhaler Inhale 2 puffs into the lungs every 6 (six) hours as needed for wheezing or shortness of breath.   amLODipine  (NORVASC ) 5 MG tablet TAKE 1 TABLET (5 MG TOTAL) BY MOUTH DAILY.   atorvastatin  (LIPITOR) 80 MG tablet Take 1 tablet (80 mg total) by mouth daily.   co-enzyme Q-10 30 MG capsule Take 200 mg by mouth daily.   Evolocumab  (REPATHA  SURECLICK) 140 MG/ML SOAJ Inject 140 mg into the skin every 14 (fourteen) days. Please deliver, please apply healthwell grant   fluticasone  (FLONASE ) 50 MCG/ACT nasal spray Place 2 sprays into both nostrils daily. (Patient taking differently: Place 2 sprays into both nostrils daily as needed.)   fluticasone  furoate-vilanterol (BREO ELLIPTA ) 100-25 MCG/ACT AEPB Inhale 1 puff into the lungs daily.   levothyroxine  (SYNTHROID ) 112 MCG tablet Take 1 tablet (112 mcg total) by mouth daily.   losartan  (COZAAR ) 100 MG tablet TAKE 1 TABLET BY MOUTH EVERY DAY   pantoprazole  (PROTONIX ) 40 MG tablet TAKE 1 TABLET BY MOUTH DAILY BEFORE BREAKFAST (Patient taking differently: Take 40 mg by mouth daily as needed.)   sertraline  (ZOLOFT ) 25 MG tablet TAKE 1 TABLET (25 MG TOTAL) BY MOUTH DAILY.   tadalafil (CIALIS) 5 MG tablet Take 5 mg by mouth daily.  [2]  Allergies Allergen Reactions   Shellfish Allergy Anaphylaxis and Swelling

## 2024-12-06 NOTE — Progress Notes (Signed)
 Department of Radiation Oncology    Followup Note    Name: Gregg [name] Date: 12/06/2024 MRN: [id number] DOB: 1956-06-16   Diagnosis: Rising PSA, post prostatectomy    MEDICATIONS: Current Outpatient Medications  Medication Sig Dispense Refill   albuterol  (VENTOLIN  HFA) 108 (90 Base) MCG/ACT inhaler Inhale 2 puffs into the lungs every 6 (six) hours as needed for wheezing or shortness of breath. 8 g 0   amLODipine  (NORVASC ) 5 MG tablet TAKE 1 TABLET (5 MG TOTAL) BY MOUTH DAILY. 90 tablet 0   atorvastatin  (LIPITOR) 80 MG tablet Take 1 tablet (80 mg total) by mouth daily. 90 tablet 3   co-enzyme Q-10 30 MG capsule Take 200 mg by mouth daily.     Evolocumab  (REPATHA  SURECLICK) 140 MG/ML SOAJ Inject 140 mg into the skin every 14 (fourteen) days. Please deliver, please apply healthwell grant 6 mL 3   fluticasone  (FLONASE ) 50 MCG/ACT nasal spray Place 2 sprays into both nostrils daily. (Patient taking differently: Place 2 sprays into both nostrils daily as needed.) 16 g 0   fluticasone  furoate-vilanterol (BREO ELLIPTA ) 100-25 MCG/ACT AEPB Inhale 1 puff into the lungs daily. 60 each 2   levothyroxine  (SYNTHROID ) 112 MCG tablet Take 1 tablet (112 mcg total) by mouth daily. 90 tablet 3   losartan  (COZAAR ) 100 MG tablet TAKE 1 TABLET BY MOUTH EVERY DAY 90 tablet 0   pantoprazole  (PROTONIX ) 40 MG tablet TAKE 1 TABLET BY MOUTH DAILY BEFORE BREAKFAST (Patient taking differently: Take 40 mg by mouth daily as needed.) 90 tablet 0   sertraline  (ZOLOFT ) 25 MG tablet TAKE 1 TABLET (25 MG TOTAL) BY MOUTH DAILY. 90 tablet 0   tadalafil (CIALIS) 5 MG tablet Take 5 mg by mouth daily.     No current facility-administered medications for this encounter.     ALLERGIES: Shellfish allergy   LABORATORY DATA:  Lab Results  Component Value Date   WBC 6.9 10/05/2023   HGB 15.5 10/05/2023   HCT 46.3 10/05/2023   MCV 92.2 10/05/2023   PLT 253.0 10/05/2023   Lab Results  Component Value Date    NA 140 03/14/2024   K 4.4 03/14/2024   CL 101 03/14/2024   CO2 25 03/14/2024   Lab Results  Component Value Date   ALT 50 (H) 09/27/2024   AST 38 09/27/2024   ALKPHOS 93 09/27/2024   BILITOT 0.5 09/27/2024     NARRATIVE: Gregg [name] was seen today in followup.  He completed radiation to his pelvis/prostate bed approximately 6 weeks ago.  This is his first follow-up visit.  He continues to receive androgen deprivation therapy under the care of Dr. Renda.  He states that he is doing reasonably well overall.  His energy level is good.  He is bothered by daily hot flashes.  He gets up typically 3 times a night to urinate.  He reports no urinary urgency or burning.  PHYSICAL EXAMINATION: height is 5' 9 (1.753 m) and weight is 183 lb 9.6 oz (83.3 kg). His oral temperature is 98.4 F (36.9 C). His blood pressure is 126/74 and his pulse is 76. His respiration is 16 and oxygen saturation is 99%.      His abdomen is nontender and nondistended.  ASSESSMENT: The patient is doing well approximately 6 weeks out from completion of external beam radiation for his rising PSA, post prostatectomy.  He continues to receive androgen deprivation therapy under the care of Dr. Renda.  PLAN: The patient is scheduled to  see Dr. Renda in follow-up in February of next year.  As he will continue to follow-up with Dr. Renda routinely, further follow-up in our department will be on a as needed basis, though I encouraged him to contact me at any time with any questions or concerns he may have.    Aamir Mclinden A. Jomarie, MD

## 2024-12-12 ENCOUNTER — Other Ambulatory Visit: Payer: Self-pay | Admitting: Family

## 2024-12-12 DIAGNOSIS — I10 Essential (primary) hypertension: Secondary | ICD-10-CM

## 2024-12-29 ENCOUNTER — Ambulatory Visit (INDEPENDENT_AMBULATORY_CARE_PROVIDER_SITE_OTHER): Admitting: Family

## 2024-12-29 ENCOUNTER — Encounter: Payer: Self-pay | Admitting: Family

## 2024-12-29 VITALS — BP 138/83 | HR 81 | Temp 99.1°F | Resp 16 | Ht 69.0 in | Wt 183.4 lb

## 2024-12-29 DIAGNOSIS — F419 Anxiety disorder, unspecified: Secondary | ICD-10-CM | POA: Diagnosis not present

## 2024-12-29 DIAGNOSIS — F32A Depression, unspecified: Secondary | ICD-10-CM | POA: Diagnosis not present

## 2024-12-29 MED ORDER — SERTRALINE HCL 25 MG PO TABS: 25.0000 mg | ORAL_TABLET | Freq: Every day | ORAL | 0 refills | Status: AC

## 2024-12-29 NOTE — Progress Notes (Signed)
Follow up, medication refill

## 2024-12-29 NOTE — Progress Notes (Signed)
 "   Patient ID: Gregg [name], male    DOB: 02/03/1956  MRN: [id number]  CC: Chronic Conditions Follow-Up  Subjective: Gregg [name] is a 69 y.o. male who presents for chronic conditions follow-up.   His concerns today include:  - Doing well on Sertraline , no issues/concerns. He denies thoughts of self-harm, suicidal ideations, homicidal ideations. - Established with Cardiology. Patient confirms he does not need refills of blood pressure medications. He does not complain of red flag symptoms such as but not limited to chest pain, shortness of breath, worst headache of life, nausea/vomiting.  - Established with Urology and Oncology.   Patient Active Problem List   Diagnosis Date Noted   Coronary artery disease 12/05/2024   Family history of heart disease 03/28/2024   Right bundle branch block 03/28/2024   Dyspnea on exertion 03/28/2024   Atypical chest pain 03/28/2024   Genetic testing 05/28/2023   Malignant neoplasm of prostate (HCC) 05/18/2023   Status post total thyroidectomy 10/14/2022   Toxic multinodular goiter 10/02/2022   Adenomatous polyp of transverse colon 09/10/2022   Diverticulosis of colon without hemorrhage 09/10/2022   Grade II internal hemorrhoids 09/10/2022   Mild persistent asthma 07/10/2022   Former smoker 07/10/2022   Enlarged thyroid  gland 01/13/2022   Symptomatic cholelithiasis 01/08/2022   S/P laparoscopic cholecystectomy 01/08/2022   Multinodular goiter 12/01/2021   Multiple thyroid  nodules 12/01/2021   Hyperlipidemia 09/11/2021   Essential hypertension 07/22/2021   Anxiety and depression 07/02/2021   History of appendicitis 07/01/2021   Acute phlegmonous appendicitis s/p lap appendectomy 03/07/2021 03/07/2021   Kidney stone 03/07/2021   Bilateral inguinal hernia (BIH) s/p lap repair with mesh 06/06/2021 03/07/2021     Medications Ordered Prior to Encounter[1]  Allergies[2]  Social History   Socioeconomic History   Marital status:  Significant Other    Spouse name: Mr [name]   Number of children: 0   Years of education: Not on file   Highest education level: Bachelor's degree (e.g., BA, AB, BS)  Occupational History   Occupation: [employer]  Tobacco Use   Smoking status: Former    Current packs/day: 0.00    Average packs/day: 0.5 packs/day for 40.0 years (20.0 ttl pk-yrs)    Types: Cigarettes    Start date: 03/29/1977    Quit date: 03/29/2017    Years since quitting: 7.7    Passive exposure: Past   Smokeless tobacco: Never  Vaping Use   Vaping status: Never Used  Substance and Sexual Activity   Alcohol use: Yes    Comment: rare   Drug use: Never   Sexual activity: Not on file  Other Topics Concern   Not on file  Social History Narrative   Not on file   Social Drivers of Health   Tobacco Use: Medium Risk (12/29/2024)   Patient History    Smoking Tobacco Use: Former    Smokeless Tobacco Use: Never    Passive Exposure: Past  Physicist, Medical Strain: Low Risk (12/22/2024)   Overall Financial Resource Strain (CARDIA)    Difficulty of Paying Living Expenses: Not hard at all  Food Insecurity: No Food Insecurity (12/22/2024)   Epic    Worried About Radiation Protection Practitioner of Food in the Last Year: Never true    Ran Out of Food in the Last Year: Never true  Transportation Needs: No Transportation Needs (12/22/2024)   Epic    Lack of Transportation (Medical): No    Lack of Transportation (Non-Medical): No  Physical  Activity: Insufficiently Active (12/22/2024)   Exercise Vital Sign    Days of Exercise per Week: 3 days    Minutes of Exercise per Session: 20 min  Stress: No Stress Concern Present (12/22/2024)   [employer] of Occupational Health - Occupational Stress Questionnaire    Feeling of Stress: Not at all  Social Connections: Moderately Integrated (12/22/2024)   Social Connection and Isolation Panel    Frequency of Communication with Friends and Family: More than three times a week     Frequency of Social Gatherings with Friends and Family: Twice a week    Attends Religious Services: More than 4 times per year    Active Member of Clubs or Organizations: Yes    Attends Banker Meetings: More than 4 times per year    Marital Status: Never married  Intimate Partner Violence: Not At Risk (08/16/2024)   Epic    Fear of Current or Ex-Partner: No    Emotionally Abused: No    Physically Abused: No    Sexually Abused: No  Depression (PHQ2-9): Low Risk (12/29/2024)   Depression (PHQ2-9)    PHQ-2 Score: 0  Alcohol Screen: Low Risk (12/22/2024)   Alcohol Screen    Last Alcohol Screening Score (AUDIT): 1  Housing: Low Risk (12/22/2024)   Epic    Unable to Pay for Housing in the Last Year: No    Number of Times Moved in the Last Year: 0    Homeless in the Last Year: No  Utilities: Not At Risk (08/16/2024)   Epic    Threatened with loss of utilities: No  Health Literacy: Adequate Health Literacy (02/14/2024)   B1300 Health Literacy    Frequency of need for help with medical instructions: Never    Family History  Problem Relation Age of Onset   Thyroid  disease Mother    Breast cancer Mother 42 - 73   Thyroid  cancer Father 41   Thyroid  disease Sister    Prostate cancer Brother 47   Bone cancer Brother 50       agent orange exposure   Prostate cancer Paternal Uncle 37   Kidney cancer Paternal Grandfather 15 - 74   Stomach cancer Neg Hx    Colon cancer Neg Hx    Esophageal cancer Neg Hx    Rectal cancer Neg Hx    Colon polyps Neg Hx     Past Surgical History:  Procedure Laterality Date   CHOLECYSTECTOMY     COLONOSCOPY WITH PROPOFOL  N/A 12/31/2021   Procedure: COLONOSCOPY WITH PROPOFOL ;  Surgeon: San Sandor GAILS, DO;  Location: [facility];  Service: Gastroenterology;  Laterality: N/A;   ENDOSCOPIC MUCOSAL RESECTION N/A 12/31/2021   Procedure: ENDOSCOPIC MUCOSAL RESECTION;  Surgeon: San Sandor GAILS, DO;  Location: [facility];  Service:  Gastroenterology;  Laterality: N/A;   EXTRACORPOREAL SHOCK WAVE LITHOTRIPSY Left 02/23/2019   Procedure: EXTRACORPOREAL SHOCK WAVE LITHOTRIPSY (ESWL);  Surgeon: Gaston Hamilton, MD;  Location: [facility];  Service: Urology;  Laterality: Left;   HEMOSTASIS CLIP PLACEMENT  12/31/2021   Procedure: HEMOSTASIS CLIP PLACEMENT;  Surgeon: San Sandor GAILS, DO;  Location: [facility];  Service: Gastroenterology;;   INGUINAL HERNIA REPAIR Bilateral 06/06/2021   Procedure: LAPAROSCOPIC BILATERAL INGUINAL HERNIA REPAIRS WITH MESH;  Surgeon: Sheldon Standing, MD;  Location: [facility];  Service: General;  Laterality: Bilateral;  TAP BLOCK   LAPAROSCOPIC APPENDECTOMY N/A 03/07/2021   Procedure: APPENDECTOMY LAPAROSCOPIC;  Surgeon: Sheldon Standing, MD;  Location: [facility];  Service: General;  Laterality: N/A;   LYMPHADENECTOMY Bilateral 06/28/2023   Procedure: BILATERAL PELVIC LYMPHADENECTOMY;  Surgeon: Renda Glance, MD;  Location: [facility];  Service: Urology;  Laterality: Bilateral;   PROSTATE BIOPSY     ROBOT ASSISTED LAPAROSCOPIC RADICAL PROSTATECTOMY N/A 06/28/2023   Procedure: XI ROBOTIC ASSISTED LAPAROSCOPIC RADICAL PROSTATECTOMY LEVEL 3;  Surgeon: Renda Glance, MD;  Location: [facility];  Service: Urology;  Laterality: N/A;  210 MINUTES NEEDED FOR CASE   SUBMUCOSAL LIFTING INJECTION  12/31/2021   Procedure: SUBMUCOSAL LIFTING INJECTION;  Surgeon: San Sandor GAILS, DO;  Location: [facility];  Service: Gastroenterology;;   THYROIDECTOMY N/A 10/02/2022   Procedure: TOTAL THYROIDECTOMY;  Surgeon: Eletha Boas, MD;  Location: [facility];  Service: General;  Laterality: N/A;    ROS: Review of Systems Negative except as stated above  PHYSICAL EXAM: BP 138/83   Pulse 81   Temp 99.1 F (37.3 C) (Oral)   Resp 16   Ht 5' 9 (1.753 m)   Wt 183 lb 6.4 oz (83.2 kg)   SpO2 95%   BMI 27.08 kg/m   Physical Exam HENT:     Head: Normocephalic and atraumatic.     Nose: Nose normal.     Mouth/Throat:      Mouth: Mucous membranes are moist.     Pharynx: Oropharynx is clear.  Eyes:     Extraocular Movements: Extraocular movements intact.     Conjunctiva/sclera: Conjunctivae normal.     Pupils: Pupils are equal, round, and reactive to light.  Cardiovascular:     Rate and Rhythm: Normal rate and regular rhythm.     Pulses: Normal pulses.     Heart sounds: Normal heart sounds.  Pulmonary:     Effort: Pulmonary effort is normal.     Breath sounds: Normal breath sounds.  Musculoskeletal:        General: Normal range of motion.     Cervical back: Normal range of motion and neck supple.  Neurological:     General: No focal deficit present.     Mental Status: He is alert and oriented to person, place, and time.  Psychiatric:        Mood and Affect: Mood normal.        Behavior: Behavior normal.     ASSESSMENT AND PLAN: 1. Anxiety and depression (Primary) - Patient denies thoughts of self-harm, suicidal ideations, homicidal ideations. - Continue Sertraline  as prescribed. Counseled on medication adherence/adverse effects.  - Follow-up with primary provider in 3 months or sooner if needed.  - sertraline  (ZOLOFT ) 25 MG tablet; Take 1 tablet (25 mg total) by mouth daily.  Dispense: 90 tablet; Refill: 0   Patient was given the opportunity to ask questions.  Patient verbalized understanding of the plan and was able to repeat key elements of the plan. Patient was given clear instructions to go to Emergency Department or return to medical center if symptoms don't improve, worsen, or new problems develop.The patient verbalized understanding.  Requested Prescriptions   Signed Prescriptions Disp Refills   sertraline  (ZOLOFT ) 25 MG tablet 90 tablet 0    Sig: Take 1 tablet (25 mg total) by mouth daily.    Return in about 3 months (around 03/29/2025) for Follow-Up or next available chronic conditions.  Greig JINNY Chute, NP      [1]  Current Outpatient Medications on File Prior to Visit   Medication Sig Dispense Refill   albuterol  (VENTOLIN  HFA) 108 (90 Base) MCG/ACT inhaler Inhale 2 puffs into the lungs every 6 (  six) hours as needed for wheezing or shortness of breath. 8 g 0   amLODipine  (NORVASC ) 5 MG tablet TAKE 1 TABLET (5 MG TOTAL) BY MOUTH DAILY. 90 tablet 0   aspirin EC 81 MG tablet Take 81 mg by mouth daily. Swallow whole.     atorvastatin  (LIPITOR) 80 MG tablet Take 1 tablet (80 mg total) by mouth daily. 90 tablet 3   co-enzyme Q-10 30 MG capsule Take 200 mg by mouth daily.     Evolocumab  (REPATHA  SURECLICK) 140 MG/ML SOAJ Inject 140 mg into the skin every 14 (fourteen) days. Please deliver, please apply healthwell grant 6 mL 3   fluticasone  (FLONASE ) 50 MCG/ACT nasal spray Place 2 sprays into both nostrils daily. (Patient taking differently: Place 2 sprays into both nostrils daily as needed.) 16 g 0   fluticasone  furoate-vilanterol (BREO ELLIPTA ) 100-25 MCG/ACT AEPB Inhale 1 puff into the lungs daily. 60 each 2   levothyroxine  (SYNTHROID ) 112 MCG tablet Take 1 tablet (112 mcg total) by mouth daily. 90 tablet 3   losartan  (COZAAR ) 100 MG tablet TAKE 1 TABLET BY MOUTH EVERY DAY 90 tablet 0   pantoprazole  (PROTONIX ) 40 MG tablet TAKE 1 TABLET BY MOUTH DAILY BEFORE BREAKFAST (Patient taking differently: Take 40 mg by mouth daily as needed.) 90 tablet 0   tadalafil (CIALIS) 5 MG tablet Take 5 mg by mouth daily.     No current facility-administered medications on file prior to visit.  [2]  Allergies Allergen Reactions   Shellfish Allergy Anaphylaxis and Swelling   "
